# Patient Record
Sex: Male | Born: 1940
Health system: Southern US, Community
[De-identification: ages and names within clinical notes are randomized; demographics above are authoritative.]

## PROBLEM LIST (undated history)

## (undated) DIAGNOSIS — R350 Frequency of micturition: Secondary | ICD-10-CM

## (undated) DIAGNOSIS — K219 Gastro-esophageal reflux disease without esophagitis: Secondary | ICD-10-CM

## (undated) DIAGNOSIS — L409 Psoriasis, unspecified: Secondary | ICD-10-CM

## (undated) DIAGNOSIS — I471 Supraventricular tachycardia, unspecified: Secondary | ICD-10-CM

## (undated) DIAGNOSIS — N529 Male erectile dysfunction, unspecified: Secondary | ICD-10-CM

## (undated) DIAGNOSIS — R911 Solitary pulmonary nodule: Secondary | ICD-10-CM

## (undated) DIAGNOSIS — I639 Cerebral infarction, unspecified: Secondary | ICD-10-CM

## (undated) DIAGNOSIS — R972 Elevated prostate specific antigen [PSA]: Secondary | ICD-10-CM

## (undated) DIAGNOSIS — H269 Unspecified cataract: Secondary | ICD-10-CM

## (undated) DIAGNOSIS — I10 Essential (primary) hypertension: Secondary | ICD-10-CM

## (undated) DIAGNOSIS — T4145XA Adverse effect of unspecified anesthetic, initial encounter: Secondary | ICD-10-CM

## (undated) DIAGNOSIS — Z87442 Personal history of urinary calculi: Secondary | ICD-10-CM

## (undated) DIAGNOSIS — T8859XA Other complications of anesthesia, initial encounter: Secondary | ICD-10-CM

## (undated) DIAGNOSIS — E785 Hyperlipidemia, unspecified: Secondary | ICD-10-CM

## (undated) HISTORY — PX: POLYPECTOMY: SHX149

## (undated) HISTORY — DX: Personal history of urinary calculi: Z87.442

## (undated) HISTORY — DX: Gastro-esophageal reflux disease without esophagitis: K21.9

## (undated) HISTORY — DX: Male erectile dysfunction, unspecified: N52.9

## (undated) HISTORY — DX: Supraventricular tachycardia: I47.1

## (undated) HISTORY — PX: NM MYOVIEW LTD: HXRAD82

## (undated) HISTORY — PX: COLONOSCOPY: SHX174

## (undated) HISTORY — DX: Psoriasis, unspecified: L40.9

## (undated) HISTORY — DX: Essential (primary) hypertension: I10

## (undated) HISTORY — DX: Unspecified cataract: H26.9

## (undated) HISTORY — DX: Hyperlipidemia, unspecified: E78.5

## (undated) HISTORY — DX: Cerebral infarction, unspecified: I63.9

## (undated) HISTORY — DX: Supraventricular tachycardia, unspecified: I47.10

---
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
## 1997-12-14 ENCOUNTER — Emergency Department (HOSPITAL_COMMUNITY): Admission: EM | Admit: 1997-12-14 | Discharge: 1997-12-14 | Payer: Self-pay

## 2003-04-20 ENCOUNTER — Encounter: Admission: RE | Admit: 2003-04-20 | Discharge: 2003-07-19 | Payer: Self-pay | Admitting: Internal Medicine

## 2003-05-21 LAB — FECAL OCCULT BLOOD, GUAIAC: Fecal Occult Blood: NEGATIVE

## 2004-01-29 ENCOUNTER — Ambulatory Visit: Payer: Self-pay | Admitting: Internal Medicine

## 2004-07-29 ENCOUNTER — Ambulatory Visit: Payer: Self-pay | Admitting: Internal Medicine

## 2005-08-04 ENCOUNTER — Emergency Department (HOSPITAL_COMMUNITY): Admission: EM | Admit: 2005-08-04 | Discharge: 2005-08-04 | Payer: Self-pay | Admitting: Emergency Medicine

## 2006-04-27 ENCOUNTER — Ambulatory Visit: Payer: Self-pay | Admitting: Internal Medicine

## 2006-04-27 LAB — CONVERTED CEMR LAB
Albumin: 4.7 g/dL (ref 3.5–5.2)
BUN: 23 mg/dL (ref 6–23)
Basophils Absolute: 0 10*3/uL (ref 0.0–0.1)
Basophils Relative: 1 % (ref 0–1)
CO2: 22 meq/L (ref 19–32)
Calcium: 10 mg/dL (ref 8.4–10.5)
Chloride: 105 meq/L (ref 96–112)
Creatinine, Ser: 1.12 mg/dL (ref 0.40–1.50)
Eosinophils Absolute: 0.2 10*3/uL (ref 0.0–0.7)
Eosinophils Relative: 4 % (ref 0–5)
Glucose, Bld: 206 mg/dL — ABNORMAL HIGH (ref 70–99)
HCT: 46.4 % (ref 39.0–52.0)
Hemoglobin: 15.7 g/dL (ref 13.0–17.0)
Hgb A1c MFr Bld: 7.9 % — ABNORMAL HIGH (ref 4.6–6.1)
Lymphocytes Relative: 21 % (ref 12–46)
Lymphs Abs: 1.4 10*3/uL (ref 0.7–3.3)
MCHC: 33.8 g/dL (ref 30.0–36.0)
MCV: 88.7 fL (ref 78.0–100.0)
Monocytes Absolute: 0.5 10*3/uL (ref 0.2–0.7)
Monocytes Relative: 8 % (ref 3–11)
Neutro Abs: 4.6 10*3/uL (ref 1.7–7.7)
Neutrophils Relative %: 67 % (ref 43–77)
Phosphorus: 2.5 mg/dL (ref 2.3–4.6)
Platelets: 198 10*3/uL (ref 150–400)
Potassium: 4.5 meq/L (ref 3.5–5.3)
RBC: 5.23 M/uL (ref 4.22–5.81)
RDW: 13 % (ref 11.5–14.0)
Sodium: 141 meq/L (ref 135–145)
TSH: 2.047 microintl units/mL (ref 0.350–5.50)
WBC: 6.9 10*3/uL (ref 4.0–10.5)

## 2006-05-08 ENCOUNTER — Ambulatory Visit: Payer: Self-pay

## 2006-05-08 ENCOUNTER — Encounter: Payer: Self-pay | Admitting: Internal Medicine

## 2006-05-28 ENCOUNTER — Ambulatory Visit: Payer: Self-pay | Admitting: Internal Medicine

## 2006-11-13 DIAGNOSIS — E785 Hyperlipidemia, unspecified: Secondary | ICD-10-CM | POA: Insufficient documentation

## 2006-11-13 DIAGNOSIS — L408 Other psoriasis: Secondary | ICD-10-CM

## 2006-11-13 DIAGNOSIS — L219 Seborrheic dermatitis, unspecified: Secondary | ICD-10-CM | POA: Insufficient documentation

## 2006-11-13 DIAGNOSIS — E1129 Type 2 diabetes mellitus with other diabetic kidney complication: Secondary | ICD-10-CM | POA: Insufficient documentation

## 2006-12-07 ENCOUNTER — Ambulatory Visit: Payer: Self-pay | Admitting: Internal Medicine

## 2006-12-10 LAB — CONVERTED CEMR LAB
ALT: 24 units/L (ref 0–53)
Albumin: 4.3 g/dL (ref 3.5–5.2)
BUN: 21 mg/dL (ref 6–23)
CO2: 32 meq/L (ref 19–32)
Calcium: 10.2 mg/dL (ref 8.4–10.5)
Chloride: 109 meq/L (ref 96–112)
Cholesterol: 217 mg/dL (ref 0–200)
Creatinine, Ser: 0.9 mg/dL (ref 0.4–1.5)
Creatinine,U: 230.8 mg/dL
Direct LDL: 155.1 mg/dL
GFR calc Af Amer: 109 mL/min
GFR calc non Af Amer: 90 mL/min
Glucose, Bld: 81 mg/dL (ref 70–99)
HDL: 40.1 mg/dL (ref >39.0)
Hgb A1c MFr Bld: 6 % (ref 4.6–6.0)
Microalb Creat Ratio: 13.9 mg/g (ref 0.0–30.0)
Microalb, Ur: 3.2 mg/dL — ABNORMAL HIGH (ref 0.0–1.9)
Phosphorus: 3 mg/dL (ref 2.3–4.6)
Potassium: 4.3 meq/L (ref 3.5–5.1)
Sodium: 146 meq/L — ABNORMAL HIGH (ref 135–145)
TSH: 1.46 microintl units/mL (ref 0.35–5.50)
Total CHOL/HDL Ratio: 5.4
Triglycerides: 167 mg/dL — ABNORMAL HIGH (ref 0–149)
VLDL: 33 mg/dL (ref 0–40)

## 2007-04-26 ENCOUNTER — Telehealth (INDEPENDENT_AMBULATORY_CARE_PROVIDER_SITE_OTHER): Payer: Self-pay | Admitting: Family Medicine

## 2007-04-26 ENCOUNTER — Ambulatory Visit: Payer: Self-pay | Admitting: Family Medicine

## 2007-05-10 DIAGNOSIS — R972 Elevated prostate specific antigen [PSA]: Secondary | ICD-10-CM | POA: Insufficient documentation

## 2007-05-22 ENCOUNTER — Ambulatory Visit: Payer: Self-pay | Admitting: Internal Medicine

## 2007-05-24 LAB — CONVERTED CEMR LAB
Albumin: 4 g/dL (ref 3.5–5.2)
BUN: 21 mg/dL (ref 6–23)
CO2: 30 meq/L (ref 19–32)
Calcium: 10.2 mg/dL (ref 8.4–10.5)
Chloride: 106 meq/L (ref 96–112)
Creatinine, Ser: 1 mg/dL (ref 0.4–1.5)
GFR calc Af Amer: 96 mL/min
GFR calc non Af Amer: 79 mL/min
Glucose, Bld: 104 mg/dL — ABNORMAL HIGH (ref 70–99)
Hgb A1c MFr Bld: 6 % (ref 4.6–6.0)
PSA: 9.13 ng/mL — ABNORMAL HIGH (ref 0.10–4.00)
Phosphorus: 2.9 mg/dL (ref 2.3–4.6)
Potassium: 4.7 meq/L (ref 3.5–5.1)
Sodium: 141 meq/L (ref 135–145)

## 2007-07-04 ENCOUNTER — Ambulatory Visit: Payer: Self-pay | Admitting: Internal Medicine

## 2007-07-04 LAB — CONVERTED CEMR LAB
Albumin: 4.2 g/dL (ref 3.5–5.2)
BUN: 19 mg/dL (ref 6–23)
CO2: 30 meq/L (ref 19–32)
Calcium: 9.9 mg/dL (ref 8.4–10.5)
Chloride: 109 meq/L (ref 96–112)
Creatinine, Ser: 1 mg/dL (ref 0.4–1.5)
GFR calc Af Amer: 96 mL/min
GFR calc non Af Amer: 79 mL/min
Glucose, Bld: 137 mg/dL — ABNORMAL HIGH (ref 70–99)
Phosphorus: 2.7 mg/dL (ref 2.3–4.6)
Potassium: 4.8 meq/L (ref 3.5–5.1)
Sodium: 142 meq/L (ref 135–145)

## 2007-10-22 ENCOUNTER — Ambulatory Visit: Payer: Self-pay | Admitting: Internal Medicine

## 2007-10-23 LAB — CONVERTED CEMR LAB: Hgb A1c MFr Bld: 8.2 % — ABNORMAL HIGH (ref 4.6–6.0)

## 2008-02-19 ENCOUNTER — Ambulatory Visit: Payer: Self-pay | Admitting: Internal Medicine

## 2008-02-19 DIAGNOSIS — N529 Male erectile dysfunction, unspecified: Secondary | ICD-10-CM | POA: Insufficient documentation

## 2008-02-24 LAB — CONVERTED CEMR LAB
Albumin: 4.1 g/dL (ref 3.5–5.2)
BUN: 25 mg/dL — ABNORMAL HIGH (ref 6–23)
CO2: 27 meq/L (ref 19–32)
Calcium: 10.1 mg/dL (ref 8.4–10.5)
Chloride: 110 meq/L (ref 96–112)
Creatinine, Ser: 0.9 mg/dL (ref 0.4–1.5)
GFR calc Af Amer: 108 mL/min
GFR calc non Af Amer: 89 mL/min
Glucose, Bld: 104 mg/dL — ABNORMAL HIGH (ref 70–99)
Hgb A1c MFr Bld: 6.8 % — ABNORMAL HIGH (ref 4.6–6.0)
PSA, Free Pct: 15 — ABNORMAL LOW (ref >25)
PSA, Free Pct: 22 — ABNORMAL LOW (ref >25)
PSA, Free: 1 ng/mL
PSA, Free: 1.7 ng/mL
PSA: 6.66 ng/mL — ABNORMAL HIGH (ref 0.10–4.00)
PSA: 7.86 ng/mL — ABNORMAL HIGH (ref 0.10–4.00)
Phosphorus: 3 mg/dL (ref 2.3–4.6)
Potassium: 4.6 meq/L (ref 3.5–5.1)
Sodium: 141 meq/L (ref 135–145)

## 2008-04-02 ENCOUNTER — Encounter (INDEPENDENT_AMBULATORY_CARE_PROVIDER_SITE_OTHER): Payer: Self-pay | Admitting: *Deleted

## 2008-07-17 ENCOUNTER — Telehealth: Payer: Self-pay | Admitting: Family Medicine

## 2008-08-13 ENCOUNTER — Encounter: Payer: Self-pay | Admitting: Internal Medicine

## 2008-08-14 ENCOUNTER — Ambulatory Visit: Payer: Self-pay | Admitting: Internal Medicine

## 2008-08-18 LAB — CONVERTED CEMR LAB
ALT: 31 units/L (ref 0–53)
AST: 22 units/L (ref 0–37)
Albumin: 4.3 g/dL (ref 3.5–5.2)
Alkaline Phosphatase: 43 units/L (ref 39–117)
BUN: 28 mg/dL — ABNORMAL HIGH (ref 6–23)
Bilirubin, Direct: 0.1 mg/dL (ref 0.0–0.3)
CO2: 28 meq/L (ref 19–32)
Calcium: 9.8 mg/dL (ref 8.4–10.5)
Chloride: 107 meq/L (ref 96–112)
Cholesterol: 155 mg/dL (ref 0–200)
Creatinine, Ser: 1.1 mg/dL (ref 0.4–1.5)
Creatinine,U: 169.2 mg/dL
Glucose, Bld: 136 mg/dL — ABNORMAL HIGH (ref 70–99)
HDL: 43.8 mg/dL (ref >39.00)
Hgb A1c MFr Bld: 7.4 % — ABNORMAL HIGH (ref 4.6–6.5)
LDL Cholesterol: 74 mg/dL (ref 0–99)
Microalb Creat Ratio: 11.8 mg/g (ref 0.0–30.0)
Microalb, Ur: 2 mg/dL — ABNORMAL HIGH (ref 0.0–1.9)
PSA, Free Pct: 15 — ABNORMAL LOW (ref >25)
PSA, Free: 1.3 ng/mL
PSA: 8.52 ng/mL — ABNORMAL HIGH (ref 0.10–4.00)
Phosphorus: 2.8 mg/dL (ref 2.3–4.6)
Potassium: 4.5 meq/L (ref 3.5–5.1)
Sodium: 141 meq/L (ref 135–145)
TSH: 1.69 microintl units/mL (ref 0.35–5.50)
Total Bilirubin: 1.2 mg/dL (ref 0.3–1.2)
Total CHOL/HDL Ratio: 4
Total Protein: 7.1 g/dL (ref 6.0–8.3)
Triglycerides: 187 mg/dL — ABNORMAL HIGH (ref 0.0–149.0)
VLDL: 37.4 mg/dL (ref 0.0–40.0)

## 2008-08-27 ENCOUNTER — Ambulatory Visit: Payer: Self-pay | Admitting: Internal Medicine

## 2008-08-27 LAB — CONVERTED CEMR LAB: Fecal Occult Bld: NEGATIVE

## 2008-11-27 ENCOUNTER — Encounter: Payer: Self-pay | Admitting: Internal Medicine

## 2009-02-16 ENCOUNTER — Ambulatory Visit: Payer: Self-pay | Admitting: Internal Medicine

## 2009-02-18 LAB — CONVERTED CEMR LAB
Basophils Absolute: 0 10*3/uL (ref 0.0–0.1)
Basophils Relative: 0.1 % (ref 0.0–3.0)
Bilirubin, Direct: 0.1 mg/dL (ref 0.0–0.3)
Calcium: 10.1 mg/dL (ref 8.4–10.5)
Direct LDL: 90.4 mg/dL
Eosinophils Absolute: 0.2 10*3/uL (ref 0.0–0.7)
Eosinophils Relative: 2.7 % (ref 0.0–5.0)
GFR calc non Af Amer: 58.31 mL/min (ref >60)
HDL: 42.8 mg/dL (ref >39.00)
Hemoglobin: 14.5 g/dL (ref 13.0–17.0)
Lymphocytes Relative: 15.4 % (ref 12.0–46.0)
Lymphs Abs: 1.2 10*3/uL (ref 0.7–4.0)
MCHC: 34.2 g/dL (ref 30.0–36.0)
Monocytes Absolute: 0.6 10*3/uL (ref 0.1–1.0)
Monocytes Relative: 8.2 % (ref 3.0–12.0)
Phosphorus: 2.7 mg/dL (ref 2.3–4.6)
Potassium: 4.8 meq/L (ref 3.5–5.1)
RBC: 4.59 M/uL (ref 4.22–5.81)
Sodium: 142 meq/L (ref 135–145)
TSH: 1.6 microintl units/mL (ref 0.35–5.50)
Total Protein: 6.7 g/dL (ref 6.0–8.3)
VLDL: 51.2 mg/dL — ABNORMAL HIGH (ref 0.0–40.0)

## 2009-03-17 ENCOUNTER — Telehealth: Payer: Self-pay | Admitting: Internal Medicine

## 2009-04-15 ENCOUNTER — Ambulatory Visit: Payer: Self-pay | Admitting: Internal Medicine

## 2009-08-16 ENCOUNTER — Ambulatory Visit: Payer: Self-pay | Admitting: Internal Medicine

## 2009-08-26 ENCOUNTER — Encounter: Payer: Self-pay | Admitting: Internal Medicine

## 2009-08-27 ENCOUNTER — Encounter: Payer: Self-pay | Admitting: Internal Medicine

## 2009-08-27 ENCOUNTER — Ambulatory Visit: Payer: Self-pay

## 2009-09-17 ENCOUNTER — Encounter (INDEPENDENT_AMBULATORY_CARE_PROVIDER_SITE_OTHER): Payer: Self-pay

## 2009-09-21 ENCOUNTER — Encounter (INDEPENDENT_AMBULATORY_CARE_PROVIDER_SITE_OTHER): Payer: Self-pay

## 2009-09-21 ENCOUNTER — Ambulatory Visit: Payer: Self-pay | Admitting: Internal Medicine

## 2009-10-05 ENCOUNTER — Ambulatory Visit: Payer: Self-pay | Admitting: Internal Medicine

## 2009-10-05 LAB — HM COLONOSCOPY

## 2009-10-07 ENCOUNTER — Encounter: Payer: Self-pay | Admitting: Internal Medicine

## 2009-10-14 ENCOUNTER — Encounter: Payer: Self-pay | Admitting: Internal Medicine

## 2009-11-09 ENCOUNTER — Encounter: Payer: Self-pay | Admitting: Internal Medicine

## 2009-11-19 ENCOUNTER — Emergency Department (HOSPITAL_COMMUNITY): Admission: EM | Admit: 2009-11-19 | Discharge: 2009-11-19 | Payer: Self-pay | Admitting: Family Medicine

## 2010-02-15 ENCOUNTER — Ambulatory Visit: Payer: Self-pay | Admitting: Internal Medicine

## 2010-03-18 ENCOUNTER — Ambulatory Visit
Admission: RE | Admit: 2010-03-18 | Discharge: 2010-03-18 | Payer: Self-pay | Source: Home / Self Care | Attending: Internal Medicine | Admitting: Internal Medicine

## 2010-03-18 ENCOUNTER — Encounter: Payer: Self-pay | Admitting: Internal Medicine

## 2010-03-18 DIAGNOSIS — Z87442 Personal history of urinary calculi: Secondary | ICD-10-CM | POA: Insufficient documentation

## 2010-03-18 LAB — HM DIABETES FOOT EXAM

## 2010-03-20 LAB — CONVERTED CEMR LAB
AST: 21 units/L (ref 0–37)
Albumin: 4.6 g/dL (ref 3.5–5.2)
Alkaline Phosphatase: 40 units/L (ref 39–117)
BUN: 29 mg/dL — ABNORMAL HIGH (ref 6–23)
Basophils Absolute: 0.1 10*3/uL (ref 0.0–0.1)
Basophils Relative: 0.7 % (ref 0.0–3.0)
Bilirubin, Direct: 0.1 mg/dL (ref 0.0–0.3)
Calcium: 9.7 mg/dL (ref 8.4–10.5)
Cholesterol: 153 mg/dL (ref 0–200)
Creatinine, Ser: 1.2 mg/dL (ref 0.4–1.5)
Eosinophils Absolute: 0.1 10*3/uL (ref 0.0–0.7)
Glucose, Bld: 96 mg/dL (ref 70–99)
Hgb A1c MFr Bld: 6.4 % (ref 4.6–6.5)
LDL Cholesterol: 68 mg/dL (ref 0–99)
MCHC: 34.4 g/dL (ref 30.0–36.0)
MCV: 91.3 fL (ref 78.0–100.0)
Monocytes Absolute: 0.6 10*3/uL (ref 0.1–1.0)
Neutrophils Relative %: 73.7 % (ref 43.0–77.0)
Phosphorus: 2.1 mg/dL — ABNORMAL LOW (ref 2.3–4.6)
RBC: 4.59 M/uL (ref 4.22–5.81)
RDW: 13.2 % (ref 11.5–14.6)
Total CHOL/HDL Ratio: 3
Total Protein: 7.3 g/dL (ref 6.0–8.3)

## 2010-03-21 LAB — CONVERTED CEMR LAB: Hgb A1c MFr Bld: 6.8 % — ABNORMAL HIGH (ref <5.7)

## 2010-03-24 NOTE — Medication Information (Signed)
Summary: [facility]  [facility]   Imported By: Lanelle Bal 10/21/2009 11:59:03  _____________________________________________________________________  External Attachment:    Type:   Image     Comment:   External Document

## 2010-03-24 NOTE — Letter (Signed)
Summary: Patient Notice- Polyp Results  [facility]  [address] [place], [place] [address]   Phone: [phone]  Fax: [phone]        October 07, 2009 MRN: [id number]    Tony Jackson [address] [place], [place]  [address]    Dear [name],  I am pleased to inform you that the colon polyp(s) removed during your recent colonoscopy was (were) found to be benign (no cancer detected) upon pathologic examination.The polyps were adenomatous ( precancerous)  I recommend you have a repeat colonoscopy examination in 3_ years to look for recurrent polyps, as having colon polyps increases your risk for having recurrent polyps or even colon cancer in the future.  Should you develop new or worsening symptoms of abdominal pain, bowel habit changes or bleeding from the rectum or bowels, please schedule an evaluation with either your primary care physician or with me.  Additional information/recommendations:  _x_ No further action with gastroenterology is needed at this time. Please      follow-up with your primary care physician for your other healthcare      needs.  __ Please call [phone] to schedule a return visit to review your      situation.  __ Please keep your follow-up visit as already scheduled.  __ Continue treatment plan as outlined the day of your exam.  Please call us if you are having persistent problems or have questions about your condition that have not been fully answered at this time.  Sincerely,  Hart Carwin MD  This letter has been electronically signed by your physician.  Appended Document: Patient Notice- Polyp Results letter mailed

## 2010-03-24 NOTE — Letter (Signed)
Summary: [facility]  [facility]   Imported By: Lanelle Bal 11/18/2009 14:08:19  _____________________________________________________________________  External Attachment:    Type:   Image     Comment:   External Document  Appended Document: [facility] PSA increased againg but recent biopsy was negative ---holding off on another for now

## 2010-03-24 NOTE — Letter (Signed)
Summary: Diabetic Instructions  [facility]  [address] [place], [place] [address]   Phone: [phone]  Fax: [phone]    Tony Jackson 11/10/1940 MRN: [id number]   _  _   ORAL DIABETIC MEDICATION INSTRUCTIONS  The day before your procedure:   Take your diabetic pill as you do normally  The day of your procedure:   Do not take your diabetic pill    We will check your blood sugar levels during the admission process and again in Recovery before discharging you home  ________________________________________________________________________  _  _   INSULIN (LONG ACTING) MEDICATION INSTRUCTIONS (Lantus, NPH, 70/30, Humulin, Novolin-N)   The day before your procedure:   Take  your regular evening dose    The day of your procedure:   Do not take your morning dose    _  _   INSULIN (SHORT ACTING) MEDICATION INSTRUCTIONS (Regular, Humulog, Novolog)   The day before your procedure:   Do not take your evening dose   The day of your procedure:   Do not take your morning dose   _  _   INSULIN PUMP MEDICATION INSTRUCTIONS  We will contact the physician managing your diabetic care for written dosage instructions for the day before your procedure and the day of your procedure.  Once we have received the instructions, we will contact you.

## 2010-03-24 NOTE — Procedures (Signed)
Summary: Colonoscopy  Patient: Tony Jackson Note: All result statuses are Final unless otherwise noted.  Tests: (1) Colonoscopy (COL)   COL Colonoscopy           DONE     [facility]     [address]     [place], [place]  [address]           COLONOSCOPY PROCEDURE REPORT           PATIENT:  Tony Jackson, Tony Jackson  MR#:  [id number]     BIRTHDATE:  Oct 06, 1940, 68 yrs. old  GENDER:  male     ENDOSCOPIST:  Hedwig Morton. Juanda Chance, MD     REF. BY:  Tillman Abide, M.D.     PROCEDURE DATE:  10/05/2009     PROCEDURE:  Colonoscopy 19147     ASA CLASS:  Class I     INDICATIONS:  Routine Risk Screening     MEDICATIONS:   Versed 7 mg, Fentanyl 50 mcg           DESCRIPTION OF PROCEDURE:   After the risks benefits and     alternatives of the procedure were thoroughly explained, informed     consent was obtained.  Digital rectal exam was performed and     revealed no rectal masses.   The LB [id number]-[id number] endoscope     was introduced through the anus and advanced to the cecum, which     was identified by both the appendix and ileocecal valve, without     limitations.  The quality of the prep was good, using MiraLax.     The instrument was then slowly withdrawn as the colon was fully     examined.     <<PROCEDUREIMAGES>>           FINDINGS:  There were multiple polyps identified and removed. 4     polyps : 15 mm sessile polyp at 60cm, 8 mm polyp at 35 cm, 5mm     polyp at 30 cm and 4 mmpolyp in the rectum The polyps were removed     using cold biopsy forceps. Polyps were snared without cautery.     Retrieval was successful (see image4, image5, image6, image7, and     image8). snare polyp  This was otherwise a normal examination of     the colon (see image9, image3, and image2).  Mild diverticulosis     was found in the sigmoid colon (see image1).   Retroflexed views     in the rectum revealed no abnormalities.    The scope was then     withdrawn from the patient and the procedure completed.        COMPLICATIONS:  None     ENDOSCOPIC IMPRESSION:     1) Polyps, multiple     2) Otherwise normal examination     3) Mild diverticulosis in the sigmoid colon     RECOMMENDATIONS:     1) Await pathology results     2) High fiber diet.     REPEAT EXAM:  In 3 year(s) for.           ______________________________     Hedwig Morton. Juanda Chance, MD           CC:           n.     eSIGNED:   Hedwig Morton. Brodie at 10/05/2009 11:35 AM           Junious Silk, [id number]  Note: An exclamation mark (!) indicates a result that was not dispersed into the flowsheet. Document Creation Date: 10/05/2009 11:36 AM _______________________________________________________________________  (1) Order result status: Final Collection or observation date-time: 10/05/2009 11:28 Requested date-time:  Receipt date-time:  Reported date-time:  Referring Physician:   Ordering Physician: Lina Sar [phone]) Specimen Source:  Source: Launa Grill Order Number: [id number] Lab site:   Appended Document: Colonoscopy     Procedures Next Due Date:    Colonoscopy: 10/2012

## 2010-03-24 NOTE — Assessment & Plan Note (Signed)
Summary: CPX/DLO   Vital Signs:  Patient profile:   70 year old male Weight:      185 pounds BMI:     27.42 Temp:     98.5 degrees F oral Pulse rate:   64 / minute Pulse rhythm:   regular BP sitting:   130 / 80  (left arm) Cuff size:   large  Vitals Entered By: Mervin Hack CMA Duncan Dull) (August 16, 2009 11:41 AM) CC: ADULT PHYSICAL   History of Present Illness: Doing okay did improve diet after last visit A1c back under 8% Has been working out at [employer] some as well  not checking sugars regularly has eye appt soon ---6 month follow up  Had prostate biopsy passed out during procedure Biopsy was negative fortunately  sigmoidoscopy last 6 years ago  Passed out in urologist's office before biopsy had it done went home and tried to go out to do lawn after and passed out again (several months ago) no chest pain no change in exercise tolerance occ mild orthostatic dizziness  Preventive Screening-Counseling & Management  Alcohol-Tobacco     Smoking Status: quit > 6 months     Cans of tobacco/week: uses regularly     Tobacco Counseling: to quit use of tobacco products  Allergies: No Known Drug Allergies  Past History:  Past medical, surgical, family and social histories (including risk factors) reviewed for relevance to current acute and chronic problems.  Past Medical History: Reviewed history from 02/19/2008 and no changes required. Diabetes mellitus, type II Hyperlipidemia Psoriasis Hypertension Erectile dysfunction  Past Surgical History: Reviewed history from 11/13/2006 and no changes required. Tonsillectomy, child Myoview stress- normal EF 56% 03/08  Family History: Reviewed history from 11/13/2006 and no changes required. Father: Died at age 82, CHF Mother: Alive, DM, Breast cancer Siblings:2 brothers living, one with ? cardiomyopathy No CAD ??HTN DM in  Mom No colon or prostate cancer Breast cancer + Mom, Mat GM  Social History: Reviewed  history from 11/13/2006 and no changes required. Marital Status: Married Children: 2 Occupation: Holiday representative - paving Former Smoker--now chews Alcohol use-no Smoking Status:  quit > 6 months  Review of Systems General:  weight down 9# sleeps fine wears seat belt. Eyes:  Complains of vision loss-1 eye; denies double vision; has had a couple of brief episodes of loss of vision in lower half of right eye some months ago. ENT:  Denies decreased hearing and ringing in ears; teeth okay--regular with dentist. CV:  Complains of fainting and lightheadness; denies chest pain or discomfort, difficulty breathing at night, difficulty breathing while lying down, palpitations, and shortness of breath with exertion; passed out in urologist's office some months ago --then later that day occ mild orthostasis. Resp:  Complains of cough; denies shortness of breath; occ cough. GI:  Complains of change in bowel habits and indigestion; denies abdominal pain, bloody stools, dark tarry stools, nausea, and vomiting; indigestion this weekend One episode of diarrhea today. GU:  Complains of erectile dysfunction; denies urinary frequency and urinary hesitancy; meds didn't help ED. MS:  Denies joint pain and joint swelling. Derm:  Complains of rash; denies lesion(s); uses topical Rx for psoriasis. Neuro:  Denies headaches, numbness, tingling, and weakness. Psych:  Complains of anxiety; denies depression; feels some stress with lack of work feels that he might be ready to retire soon. Heme:  Denies abnormal bruising and enlarge lymph nodes. Allergy:  Denies seasonal allergies and sneezing.  Physical Exam  General:  alert and normal  appearance.   Eyes:  pupils equal, pupils round, pupils reactive to light, and no optic disk abnormalities.   Ears:  R ear normal and L ear normal.   Mouth:  no erythema and no exudates.   Neck:  supple, no masses, no thyromegaly, no carotid bruits, and no cervical lymphadenopathy.     Lungs:  normal respiratory effort, no intercostal retractions, no accessory muscle use, normal breath sounds, no crackles, and no wheezes.   Heart:  normal rate, regular rhythm, no murmur, and no gallop.   Abdomen:  soft, non-tender, and no masses.   Msk:  no joint tenderness and no joint swelling.   Pulses:  2+ in feet Extremities:  no edema Neurologic:  alert & oriented X3, strength normal in all extremities, and gait normal.   Skin:  no suspicious lesions and no ulcerations.   Axillary Nodes:  No palpable lymphadenopathy Psych:  normally interactive, good eye contact, not anxious appearing, and not depressed appearing.    Diabetes Management Exam:    Foot Exam (with socks and/or shoes not present):       Sensory-Pinprick/Light touch:          Left medial foot (L-4): normal          Left dorsal foot (L-5): normal          Left lateral foot (S-1): normal          Right medial foot (L-4): normal          Right dorsal foot (L-5): normal          Right lateral foot (S-1): normal       Inspection:          Left foot: normal          Right foot: normal       Nails:          Left foot: normal          Right foot: normal    Eye Exam:       Eye Exam done elsewhere          Date: 02/22/2009          Results: no retinopathy           Done by: Dr Dagoberto Ligas   Impression & Recommendations:  Problem # 1:  PREVENTIVE HEALTH CARE (ICD-V70.0) Assessment Comment Only will follow up with urology for abnormal PSA will set up colonoscopy  Problem # 2:  SYNCOPE (ICD-780.2) Assessment: New  sounds vasovagal  no testing needed  Orders: EKG w/ Interpretation (93000)  Problem # 3:  UNSPECIFIED VISUAL LOSS (ICD-369.9) Assessment: New  not complete visual field but high risk for vascular problems will check carotids discussed tobacco cessation  Orders: Doppler Referral (Doppler)  Problem # 4:  DIABETES MELLITUS, TYPE II (ICD-250.00) Assessment: Comment Only  hopefully control  still okay--he doesn't check \ His updated medication list for this problem includes:    Metformin Hcl 500 Mg Tabs (Metformin hcl) .Marland Kitchen... Take 1 tablet by mouth twice a day    Lisinopril 10 Mg Tabs (Lisinopril) .Marland Kitchen... Take one by mouth daily    Glipizide Xl 5 Mg Xr24h-tab (Glipizide) .Marland Kitchen... 1 daily before breakfast  Labs Reviewed: Creat: 1.3 (02/16/2009)     Last Eye Exam: no retinopathy  (02/22/2009) Reviewed HgBA1c results: 7.6 (04/15/2009)  9.1 (02/16/2009)  Orders: TLB-Microalbumin/Creat Ratio, Urine (82043-MALB) TLB-A1C / Hgb A1C (Glycohemoglobin) (83036-A1C)  Problem # 5:  HYPERLIPIDEMIA (ICD-272.4) Assessment: Unchanged  no  problems with meds will check labs  His updated medication list for this problem includes:    Simvastatin 40 Mg Tabs (Simvastatin) .Marland Kitchen... 1 daily at bedtime  Labs Reviewed: SGOT: 24 (02/16/2009)   SGPT: 30 (02/16/2009)   HDL:42.80 (02/16/2009), 43.80 (08/14/2008)  LDL:74 (08/14/2008), DEL (16/11/9602)  Chol:172 (02/16/2009), 155 (08/14/2008)  Trig:256.0 (02/16/2009), 187.0 (08/14/2008)  Orders: TLB-Lipid Panel (80061-LIPID) TLB-Hepatic/Liver Function Pnl (80076-HEPATIC)  Problem # 6:  HYPERTENSION (ICD-401.9) Assessment: Unchanged  if continued orthostatic symptoms may need to consider decreasing dose  His updated medication list for this problem includes:    Lisinopril 10 Mg Tabs (Lisinopril) .Marland Kitchen... Take one by mouth daily  BP today: 130/80 Prior BP: 146/90 (02/16/2009)  Labs Reviewed: K+: 4.8 (02/16/2009) Creat: : 1.3 (02/16/2009)   Chol: 172 (02/16/2009)   HDL: 42.80 (02/16/2009)   LDL: 74 (08/14/2008)   TG: 256.0 (02/16/2009)  Orders: TLB-Renal Function Panel (80069-RENAL) TLB-CBC Platelet - w/Differential (85025-CBCD) TLB-TSH (Thyroid Stimulating Hormone) (84443-TSH) Venipuncture (54098)  Problem # 7:  ERECTILE DYSFUNCTION, ORGANIC (ICD-607.84) Assessment: Comment Only trying viagra not much help with cialis  The following  medications were removed from the medication list:    Cialis 20 Mg Tabs (Tadalafil) .Marland Kitchen... 1/2 -1 about an hour before sex    Cialis 20 Mg Tabs (Tadalafil) .Marland Kitchen... As needed His updated medication list for this problem includes:    Viagra 100 Mg Tabs (Sildenafil citrate) .Marland Kitchen... 1/2-1 tab about 30-60 minutes before sex  Complete Medication List: 1)  Metformin Hcl 500 Mg Tabs (Metformin hcl) .... Take 1 tablet by mouth twice a day 2)  Simvastatin 40 Mg Tabs (Simvastatin) .Marland Kitchen.. 1 daily at bedtime 3)  Lisinopril 10 Mg Tabs (Lisinopril) .... Take one by mouth daily 4)  Glipizide Xl 5 Mg Xr24h-tab (Glipizide) .Marland Kitchen.. 1 daily before breakfast 5)  Mometasone Furoate 0.1 % Crea (Mometasone furoate) .... Apply as needed 6)  Lifescan Unistik 2 Misc (Lancets) .... Check two times a day 7)  Viagra 100 Mg Tabs (Sildenafil citrate) .... 1/2-1 tab about 30-60 minutes before sex  Other Orders: Gastroenterology Referral (GI)  Patient Instructions: 1)  Please try quitting the chewing tobacco by using a 21mg  nicotine patch daily for 2-3 months 2)  Please schedule a follow-up appointment in 6 months .  3)  Schedule a colonoscopy/ sigmoidoscopy to help detect colon cancer.  4)  Please set up carotid ultrasound also Prescriptions: VIAGRA 100 MG TABS (SILDENAFIL CITRATE) 1/2-1 tab about 30-60 minutes before sex  #6 x 5   Entered and Authorized by:   Cindee Salt MD   Signed by:   Cindee Salt MD on 08/16/2009   Method used:   Electronically to        [facility]  [address] [phone]* (retail)       [address]       [place], [place]  [address]       Ph: [phone]       Fax: [phone]   RxID:   [id number]   Current Allergies (reviewed today): No known allergies    EKG  Procedure date:  08/16/2009  Findings:      sinus bradycardia @59  normal

## 2010-03-24 NOTE — Letter (Signed)
Summary: [facility] Instructions  [facility]  [address] [place], [place] [address]   Phone: [phone]  Fax: [phone]       ALANMICHAEL BARMORE    Jun 17, 1940    MRN: [id number]       Procedure Day Dorna Bloom:  Jake Shark  10/05/09     Arrival Time:  9:30am     Procedure Time:  10:30am     Location of Procedure:                    Julian[facility] Pares  [facility] (4th Floor)    PREPARATION FOR COLONOSCOPY WITH MIRALAX  Starting 5 days prior to your procedure  THURSDAY 08/11 do not eat nuts, seeds, popcorn, corn, beans, peas,  salads, or any raw vegetables.  Do not take any fiber supplements (e.g. Metamucil, Citrucel, and Benefiber). ____________________________________________________________________________________________________   THE DAY BEFORE YOUR PROCEDURE         DATE: MONDAY  08/15  1   Drink clear liquids the entire day-NO SOLID FOOD  2   Do not drink anything colored red or purple.  Avoid juices with pulp.  No orange juice.  3   Drink at least 64 oz. (8 glasses) of fluid/clear liquids during the day to prevent dehydration and help the prep work efficiently.  CLEAR LIQUIDS INCLUDE: Water Jello Ice Popsicles Tea (sugar ok, no milk/cream) Powdered fruit flavored drinks Coffee (sugar ok, no milk/cream) Gatorade Juice: apple, white grape, white cranberry  Lemonade Clear bullion, consomm, broth Carbonated beverages (any kind) Strained chicken noodle soup Hard Candy  4   Mix the entire bottle of Miralax with 64 oz. of Gatorade/Powerade in the morning and put in the refrigerator to chill.  5   At 3:00 pm take 2 Dulcolax/Bisacodyl tablets.  6   At 4:30 pm take one Reglan/Metoclopramide tablet.  7  Starting at 5:00 pm drink one 8 oz glass of the Miralax mixture every 15-20 minutes until you have finished drinking the entire 64 oz.  You should finish drinking prep around 7:30 or 8:00 pm.  8   If you are nauseated, you may take the 2nd Reglan/Metoclopramide  tablet at 6:30 pm.        9    At 8:00 pm take 2 more DULCOLAX/Bisacodyl tablets.     THE DAY OF YOUR PROCEDURE      DATE:  TUESDAY  08/16  You may drink clear liquids until  8:30am  (2 HOURS BEFORE PROCEDURE).   MEDICATION INSTRUCTIONS  Unless otherwise instructed, you should take regular prescription medications with a small sip of water as early as possible the morning of your procedure.  Diabetic patients - see separate instructions.          OTHER INSTRUCTIONS  You will need a responsible adult at least 70 years of age to accompany you and drive you home.   This person must remain in the waiting room during your procedure.  Wear loose fitting clothing that is easily removed.  Leave jewelry and other valuables at home.  However, you may wish to bring a book to read or an iPod/MP3 player to listen to music as you wait for your procedure to start.  Remove all body piercing jewelry and leave at home.  Total time from sign-in until discharge is approximately 2-3 hours.  You should go home directly after your procedure and rest.  You can resume normal activities the day after your procedure.  The day  of your procedure you should not:   Drive   Make legal decisions   Operate machinery   Drink alcohol   Return to work  You will receive specific instructions about eating, activities and medications before you leave.   The above instructions have been reviewed and explained to me by   Ulis Rias RN  September 21, 2009 1:49 PM     I fully understand and can verbalize these instructions _____________________________ Date _______

## 2010-03-24 NOTE — Progress Notes (Signed)
Summary: Metformin, Simvastatin,Lisinopril & Glypizide rxs.  Phone Note Refill Request Call back at [phone] Message from:  Patient on March 17, 2009 10:19 AM  Refills Requested: Medication #1:  METFORMIN HCL 500 MG TABS Take 1 tablet by mouth twice a day  Medication #2:  SIMVASTATIN 40 MG  TABS 1 daily at bedtime  Medication #3:  LISINOPRIL 10 MG  TABS Take one by mouth daily  Medication #4:  GLIPIZIDE XL 5 MG XR24H-TAB 1 daily before breakfast Pt request written rx for the four meds listed for 90 day supply with one year refills please. When rxs are ready for pick up please call pt at [phone]. Thank you.   Method Requested: Pick up at Office Initial call taken by: Lewanda Rife LPN,  March 17, 2009 10:20 AM Caller: Patient Call For: Cindee Salt MD  Follow-up for Phone Call        okay to prepare Rx for me If [facility], can send electronically Follow-up by: Cindee Salt MD,  March 17, 2009 10:44 AM  Additional Follow-up for Phone Call Additional follow up Details #1::        pt has changed insurance and now has to mail thru [employer], rx's are on your desk to be signed and pt will pick-up tomorrow. DeShannon Smith CMA Duncan Dull)  March 17, 2009 3:56 PM   patient came in before Dr. Alphonsus Sias had chance to sign rx's, will get Dr. Patsy Lager to sign so pt won't have to come back. ( bad weather today). Additional Follow-up by: Mervin Hack CMA (AAMA),  March 18, 2009 9:00 AM    Prescriptions: GLIPIZIDE XL 5 MG XR24H-TAB (GLIPIZIDE) 1 daily before breakfast  #90 x 3   Entered by:   Mervin Hack CMA (AAMA)   Authorized by:   Hannah Beat MD   Signed by:   Mervin Hack CMA (AAMA) on 03/18/2009   Method used:   Print then Give to Patient   RxID:   [id number] LISINOPRIL 10 MG  TABS (LISINOPRIL) Take one by mouth daily  #90 x 3   Entered by:   Mervin Hack CMA (AAMA)   Authorized by:   Hannah Beat MD   Signed by:   Mervin Hack CMA (AAMA)  on 03/18/2009   Method used:   Print then Give to Patient   RxID:   [id number] SIMVASTATIN 40 MG  TABS (SIMVASTATIN) 1 daily at bedtime  #90 x 3   Entered by:   Mervin Hack CMA (AAMA)   Authorized by:   Hannah Beat MD   Signed by:   Mervin Hack CMA (AAMA) on 03/18/2009   Method used:   Print then Give to Patient   RxID:   [id number] METFORMIN HCL 500 MG TABS (METFORMIN HCL) Take 1 tablet by mouth twice a day  #180 x 3   Entered by:   Mervin Hack CMA (AAMA)   Authorized by:   Hannah Beat MD   Signed by:   Mervin Hack CMA (AAMA) on 03/18/2009   Method used:   Print then Give to Patient   RxID:   [id number] GLIPIZIDE XL 5 MG XR24H-TAB (GLIPIZIDE) 1 daily before breakfast  #90 x 3   Entered by:   Mervin Hack CMA (AAMA)   Authorized by:   Cindee Salt MD   Signed by:   Mervin Hack CMA (AAMA) on 03/17/2009   Method used:   Print then Give to Patient   RxID:   [id number] LISINOPRIL 10  MG  TABS (LISINOPRIL) Take one by mouth daily  #90 x 3   Entered by:   Mervin Hack CMA (AAMA)   Authorized by:   Cindee Salt MD   Signed by:   Mervin Hack CMA (AAMA) on 03/17/2009   Method used:   Print then Give to Patient   RxID:   [id number] SIMVASTATIN 40 MG  TABS (SIMVASTATIN) 1 daily at bedtime  #90 x 3   Entered by:   Mervin Hack CMA (AAMA)   Authorized by:   Cindee Salt MD   Signed by:   Mervin Hack CMA (AAMA) on 03/17/2009   Method used:   Print then Give to Patient   RxID:   [id number] METFORMIN HCL 500 MG TABS (METFORMIN HCL) Take 1 tablet by mouth twice a day  #180 x 3   Entered by:   Mervin Hack CMA (AAMA)   Authorized by:   Cindee Salt MD   Signed by:   Mervin Hack CMA (AAMA) on 03/17/2009   Method used:   Print then Give to Patient   RxID:   [id number]

## 2010-03-24 NOTE — Assessment & Plan Note (Signed)
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  Past Medical History: Diabetes mellitus, type II Hyperlipidemia Psoriasis Hypertension Erectile dysfunction Nephrolithiasis, hx of--- first 2011  Past Surgical History: Reviewed history from 11/13/2006 and no changes required. Tonsillectomy, child Myoview stress- normal EF 56% 03/08  Family History: Reviewed history from 11/13/2006 and no changes required. Father: Died at age 63, CHF Mother: Alive, DM, Breast cancer Siblings:2 brothers living, one with ? cardiomyopathy No CAD ??HTN DM in  Mom No colon or prostate cancer Breast cancer + Mom, Mat GM  Social History: Reviewed history from 11/13/2006 and no changes required. Marital Status: Married Children: 2 Occupation: Holiday representative - paving Former Smoker--now chews Alcohol use-no  Review of Systems       Passed kidney stone last fall weight is stable Sleeps okay  Physical Exam  General:  alert and normal appearance.   Neck:  supple, no masses, no thyromegaly, no carotid bruits, and no cervical lymphadenopathy.   Lungs:  normal respiratory effort, no intercostal retractions, no accessory  muscle use, and normal breath sounds.   Heart:  normal rate, regular rhythm, no murmur, and no gallop.   Pulses:  2+ in feet Extremities:  no edema Neurologic:  strength normal in all extremities and gait normal.   Skin:  no suspicious lesions and no ulcerations.   Psych:  normally interactive, good eye contact, not anxious appearing, and not depressed appearing.    Diabetes Management Exam:    Foot Exam (with socks and/or shoes not present):       Sensory-Pinprick/Light touch:          Left medial foot (L-4): normal          Left dorsal foot (L-5): normal          Left lateral foot (S-1): normal          Right medial foot (L-4): normal          Right dorsal foot (L-5): normal          Right lateral foot (S-1): normal       Inspection:          Left foot: normal          Right foot: normal       Nails:          Left foot: thickened          Right foot: thickened    Eye Exam:       Eye Exam done elsewhere          Date: 04/20/2009  Results: normal          Done by: Dr Dagoberto Ligas   Impression & Recommendations:  Problem # 1:  DIABETES MELLITUS, TYPE II (ICD-250.00) Assessment Unchanged  still seems to have reasonable control will recheck  His updated medication list for this problem includes:    Metformin Hcl 500 Mg Tabs (Metformin hcl) .Marland Kitchen... Take 1 tablet by mouth twice a day    Lisinopril 10 Mg Tabs (Lisinopril) .Marland Kitchen... Take one by mouth daily    Glipizide Xl 5 Mg Xr24h-tab (Glipizide) .Marland Kitchen... 1 daily before breakfast  Labs Reviewed: Creat: 1.2 (08/16/2009)     Last Eye Exam: normal (04/20/2009) Reviewed HgBA1c results: 6.4 (08/16/2009)  7.6 (04/15/2009)  Orders: Venipuncture (16109) Specimen Handling (60454) T- Hemoglobin A1C (09811-91478)  Problem # 2:  HYPERTENSION (ICD-401.9) Assessment: Unchanged good control no changes  His updated medication list for this problem includes:    Lisinopril 10 Mg Tabs (Lisinopril) .Marland Kitchen... Take one by mouth  daily  BP today: 137/71 Prior BP: 130/80 (08/16/2009)  Labs Reviewed: K+: 5.2 (08/16/2009) Creat: : 1.2 (08/16/2009)   Chol: 153 (08/16/2009)   HDL: 46.40 (08/16/2009)   LDL: 68 (08/16/2009)   TG: 194.0 (08/16/2009)  Problem # 3:  HYPERLIPIDEMIA (ICD-272.4) Assessment: Unchanged goal LDL no side effects  His updated medication list for this problem includes:    Simvastatin 40 Mg Tabs (Simvastatin) .Marland Kitchen... 1 daily at bedtime  Labs Reviewed: SGOT: 21 (08/16/2009)   SGPT: 26 (08/16/2009)   HDL:46.40 (08/16/2009), 42.80 (02/16/2009)  LDL:68 (08/16/2009), 74 (08/14/2008)  Chol:153 (08/16/2009), 172 (02/16/2009)  Trig:194.0 (08/16/2009), 256.0 (02/16/2009)  Complete Medication List: 1)  Metformin Hcl 500 Mg Tabs (Metformin hcl) .... Take 1 tablet by mouth twice a day 2)  Simvastatin 40 Mg Tabs (Simvastatin) .Marland Kitchen.. 1 daily at bedtime 3)  Lisinopril 10 Mg Tabs (Lisinopril) .... Take one by mouth daily 4)  Glipizide Xl 5 Mg Xr24h-tab (Glipizide) .Marland Kitchen.. 1 daily before breakfast 5)  Mometasone Furoate 0.1 % Crea (Mometasone furoate) .... Apply as needed 6)  Lifescan Unistik 2 Misc (Lancets) .... Check two times a day 7)  Viagra 100 Mg Tabs (Sildenafil citrate) .... 1/2-1 tab about 30-60 minutes before sex  Patient Instructions: 1)  Please schedule a follow-up appointment in 6 months for physical   Orders Added: 1)  Est. Patient Level IV [29562] 2)  Venipuncture [13086] 3)  Specimen Handling [99000] 4)  T- Hemoglobin A1C [83036-23375]    Current Allergies (reviewed today): No known allergies

## 2010-03-24 NOTE — Miscellaneous (Signed)
Summary: Lec previsit  Clinical Lists Changes  Medications: Added new medication of MIRALAX   POWD (POLYETHYLENE GLYCOL 3350) As per prep  instructions. - Signed Added new medication of DULCOLAX 5 MG  TBEC (BISACODYL) Day before procedure take 2 at 3pm and 2 at 8pm. - Signed Added new medication of REGLAN 10 MG  TABS (METOCLOPRAMIDE HCL) As per prep instructions. - Signed Rx of MIRALAX   POWD (POLYETHYLENE GLYCOL 3350) As per prep  instructions.;  #255gm x 0;  Signed;  Entered by: Ulis Rias RN;  Authorized by: Hart Carwin MD;  Method used: Electronically to [id number] [address] [place], [place], [place]  [address], Ph: [phone], Fax: [phone] Rx of DULCOLAX 5 MG  TBEC (BISACODYL) Day before procedure take 2 at 3pm and 2 at 8pm.;  #4 x 0;  Signed;  Entered by: Ulis Rias RN;  Authorized by: Hart Carwin MD;  Method used: Electronically to [id number] [address] [place], [place], [place]  [address], Ph: [phone], Fax: [phone] Rx of REGLAN 10 MG  TABS (METOCLOPRAMIDE HCL) As per prep instructions.;  #2 x 0;  Signed;  Entered by: Ulis Rias RN;  Authorized by: Hart Carwin MD;  Method used: Electronically to [id number] [address], [place], [place], [place]  [address], Ph: [phone], Fax: [phone]    Prescriptions: REGLAN 10 MG  TABS (METOCLOPRAMIDE HCL) As per prep instructions.  #2 x 0   Entered by:   Ulis Rias RN   Authorized by:   Hart Carwin MD   Signed by:   Ulis Rias RN on 09/21/2009   Method used:   [facility] to        [facility] [phone] (retail)       [address]       [place], [place]  [address]       Ph: [phone]       Fax: [phone]   RxID:   [id number] DULCOLAX 5 MG  TBEC (BISACODYL) Day before procedure take 2 at 3pm and 2 at 8pm.  #4 x 0   Entered by:   Ulis Rias RN   Authorized by:   Hart Carwin MD   Signed  by:   Ulis Rias RN on 09/21/2009   Method used:   [facility]ronically to        [facility] [phone]* (retail)       [address]       [place], [place]  [address]       Ph: [phone]       Fax: [phone]   RxID:   [id number] MIRALAX   POWD (POLYETHYLENE GLYCOL 3350) As per prep  instructions.  #255gm x 0   Entered by:   Ulis Rias RN   Authorized by:   Hart Carwin MD   Signed by:   Ulis Rias RN on 09/21/2009   Method used:   [facility] to        [facility] [phone] ([facility]ail)       [address]       [place], [place]  [address]       Ph: [phone]  Fax: [phone]   RxID:   [id number]

## 2010-03-24 NOTE — Miscellaneous (Signed)
Summary: Orders Update  Clinical Lists Changes  Orders: Added new Test order of Carotid Duplex (Carotid Duplex) - Signed 

## 2010-05-06 LAB — GLUCOSE, CAPILLARY
Glucose-Capillary: 148 mg/dL — ABNORMAL HIGH (ref 70–99)
Glucose-Capillary: 97 mg/dL (ref 70–99)

## 2010-09-26 ENCOUNTER — Encounter: Payer: Self-pay | Admitting: Internal Medicine

## 2010-10-06 ENCOUNTER — Encounter: Payer: Self-pay | Admitting: Internal Medicine

## 2010-10-07 ENCOUNTER — Encounter: Payer: Self-pay | Admitting: Internal Medicine

## 2010-10-07 ENCOUNTER — Ambulatory Visit (INDEPENDENT_AMBULATORY_CARE_PROVIDER_SITE_OTHER): Payer: Medicare Other | Admitting: Internal Medicine

## 2010-10-07 VITALS — BP 128/80 | HR 54 | Temp 97.9°F | Ht 69.0 in | Wt 183.0 lb

## 2010-10-07 DIAGNOSIS — L408 Other psoriasis: Secondary | ICD-10-CM

## 2010-10-07 DIAGNOSIS — Z2911 Encounter for prophylactic immunotherapy for respiratory syncytial virus (RSV): Secondary | ICD-10-CM

## 2010-10-07 DIAGNOSIS — E785 Hyperlipidemia, unspecified: Secondary | ICD-10-CM

## 2010-10-07 DIAGNOSIS — I10 Essential (primary) hypertension: Secondary | ICD-10-CM

## 2010-10-07 DIAGNOSIS — Z0000 Encounter for general adult medical examination without abnormal findings: Secondary | ICD-10-CM | POA: Insufficient documentation

## 2010-10-07 DIAGNOSIS — E119 Type 2 diabetes mellitus without complications: Secondary | ICD-10-CM

## 2010-10-07 LAB — CBC WITH DIFFERENTIAL/PLATELET
Basophils Absolute: 0 10*3/uL (ref 0.0–0.1)
Hemoglobin: 13.5 g/dL (ref 13.0–17.0)
Lymphocytes Relative: 19 % (ref 12.0–46.0)
Monocytes Relative: 9.3 % (ref 3.0–12.0)
Neutro Abs: 4.1 10*3/uL (ref 1.4–7.7)
Neutrophils Relative %: 67.9 % (ref 43.0–77.0)
Platelets: 185 10*3/uL (ref 150.0–400.0)
RDW: 14 % (ref 11.5–14.6)

## 2010-10-07 LAB — BASIC METABOLIC PANEL
Calcium: 9.9 mg/dL (ref 8.4–10.5)
Creatinine, Ser: 1.4 mg/dL (ref 0.4–1.5)
GFR: 52.41 mL/min — ABNORMAL LOW (ref >60.00)
Glucose, Bld: 145 mg/dL — ABNORMAL HIGH (ref 70–99)
Sodium: 141 mEq/L (ref 135–145)

## 2010-10-07 LAB — HEPATIC FUNCTION PANEL
AST: 18 U/L (ref 0–37)
Albumin: 4.7 g/dL (ref 3.5–5.2)
Alkaline Phosphatase: 44 U/L (ref 39–117)
Total Bilirubin: 0.9 mg/dL (ref 0.3–1.2)

## 2010-10-07 LAB — TSH: TSH: 0.51 u[IU]/mL (ref 0.35–5.50)

## 2010-10-07 LAB — MICROALBUMIN / CREATININE URINE RATIO
Microalb Creat Ratio: 1.5 mg/g (ref 0.0–30.0)
Microalb, Ur: 2.9 mg/dL — ABNORMAL HIGH (ref 0.0–1.9)

## 2010-10-07 LAB — LIPID PANEL
HDL: 53.4 mg/dL (ref >39.00)
Total CHOL/HDL Ratio: 3

## 2010-10-07 MED ORDER — MOMETASONE FUROATE 0.1 % EX CREA: 1.0000 "application " | TOPICAL_CREAM | Freq: Two times a day (BID) | CUTANEOUS | Status: DC | PRN

## 2010-10-07 NOTE — Assessment & Plan Note (Signed)
Doesn't check regularly Hopefully still good control Lab Results  Component Value Date   HGBA1C 6.8* 03/18/2010

## 2010-10-07 NOTE — Assessment & Plan Note (Signed)
Relatively mild Does well with the mometasone

## 2010-10-07 NOTE — Assessment & Plan Note (Signed)
BP Readings from Last 3 Encounters:  10/07/10 128/80  03/18/10 137/71  08/16/09 130/80   Good control Due for labs

## 2010-10-07 NOTE — Assessment & Plan Note (Signed)
No problems with meds Will check labs

## 2010-10-07 NOTE — Progress Notes (Signed)
Subjective:    Patient ID: Tony Jackson, male    DOB: 1940-03-29, 70 y.o.   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 222 last night 130's last time but not often No hypoglycemic reactions Keeps up with eye doctor--every 6-8 months now (early cataracts) No foot pain or sores  Last PSA at urologist was still high but apparently stable Discussed considering dropping this testing  Current Outpatient Prescriptions on File Prior to Visit  Medication Sig Dispense Refill  . glipiZIDE (GLUCOTROL XL) 5 MG 24 hr tablet Take 5 mg by mouth daily.        . Lancets (LIFESCAN UNISTIK 2) MISC by Does not apply route 2 (two) times daily.        Marland Kitchen lisinopril (PRINIVIL,ZESTRIL) 10 MG tablet Take 10 mg by mouth daily.        . metFORMIN (GLUCOPHAGE) 500 MG tablet Take 500 mg by mouth 2 (two) times daily with a meal.        . mometasone (ELOCON) 0.1 % cream Apply 1 application topically daily.        . sildenafil (VIAGRA) 100 MG tablet Take 50-100 mg by mouth daily as needed.        . simvastatin (ZOCOR) 40 MG tablet Take 40 mg by mouth at bedtime.          No Known Allergies  Past Medical History  Diagnosis Date  . Diabetes mellitus   . Hyperlipidemia   . Psoriasis   . Hypertension   . ED (erectile dysfunction)   . History of nephrolithiasis     Past Surgical History  Procedure Date  . Tonsillectomy   . Nm myoview ltd     normal EF 56% 03/08    Family History  Problem Relation Age of Onset  . Cancer Mother     breast cancer  . Diabetes Mother   . Heart disease Neg Hx     History   Social History  . Marital Status: Married    Spouse Name: N/A    Number of Children: 2  . Years of Education: N/A   Occupational History  . Holiday representative- paving    Social History Main Topics  . Smoking status: Former Games developer  . Smokeless tobacco: Current User    Types: Chew  . Alcohol Use: No  .  Drug Use: No  . Sexually Active: Not on file   Other Topics Concern  . Not on file   Social History Narrative  . No narrative on file   Review of Systems  Constitutional: Negative for fatigue and unexpected weight change.       Weight stable Wears seat belt Needs to restart his exercise  HENT: Negative for hearing loss, congestion, rhinorrhea, dental problem and tinnitus.        Overdue with dentist  Eyes: Negative for visual disturbance.       No diplopia or focal vision loss   Respiratory: Positive for cough. Negative for chest tightness and shortness of breath.        Dry cough seems better  Cardiovascular: Negative for chest pain, palpitations and leg swelling.  Gastrointestinal: Negative for nausea, vomiting, abdominal pain, constipation and blood in stool.       No heartburn  Genitourinary: Positive for urgency.       Stream is slower Occ urgency but not regular Rare nocturia No sex--no problem  Musculoskeletal: Positive for arthralgias. Negative for back pain and joint swelling.       Right hip pain and was limping on one job several weeks ago--no problem since then  Skin: Positive for rash. Negative for wound.       Psoriasis---uses cream occ  Neurological: Positive for light-headedness. Negative for dizziness, syncope, weakness, numbness and headaches.  Hematological: Negative for adenopathy. Does not bruise/bleed easily.  Psychiatric/Behavioral: Positive for dysphoric mood. Negative for sleep disturbance. The patient is not nervous/anxious.        Feels down when he is not busy       Objective:   Physical Exam  Constitutional: He is oriented to person, place, and time. He appears well-developed and well-nourished. No distress.  HENT:  Head: Normocephalic and atraumatic.  Right Ear: External ear normal.  Left Ear: External ear normal.  Mouth/Throat: Oropharynx is clear and moist. No oropharyngeal exudate.       TMs normal  Eyes: Conjunctivae and EOM are  normal. Pupils are equal, round, and reactive to light.  Neck: Normal range of motion. Neck supple. No thyromegaly present.  Cardiovascular: Normal rate, regular rhythm, normal heart sounds and intact distal pulses.  Exam reveals no gallop.   No murmur heard. Pulmonary/Chest: Effort normal and breath sounds normal. No respiratory distress. He has no wheezes. He has no rales.  Abdominal: Soft. There is no tenderness.  Musculoskeletal: Normal range of motion. He exhibits no edema and no tenderness.  Lymphadenopathy:    He has no cervical adenopathy.  Neurological: He is alert and oriented to person, place, and time. He exhibits normal muscle tone.       Normal strength and gait  Skin: Rash noted.       Psoriatic plaques on extensor knees and elbows  Psychiatric: He has a normal mood and affect. His behavior is normal. Judgment and thought content normal.          Assessment & Plan:

## 2010-10-07 NOTE — Assessment & Plan Note (Signed)
Discussed increased activity Zostavax today Advised not to have further PSA testing

## 2010-11-21 ENCOUNTER — Telehealth: Payer: Self-pay | Admitting: *Deleted

## 2010-11-21 NOTE — Telephone Encounter (Signed)
Pt states he went on a cruise a couple of weeks ago and since he has been back his blood sugar has been elevated, averaging around 276 two hours after a meal.  The highest it has been has been 445, lowest 142.  He has not done anything different, only went on the cruise for 8 days and ate too much during his trip.  He has eaten better since getting back, but sugar isnt going down as it should.

## 2010-11-21 NOTE — Telephone Encounter (Signed)
Spoke with patient and advised results, he will double up on 500 mg, because he has a lot of those tablets.

## 2010-11-21 NOTE — Telephone Encounter (Signed)
His sugar control had already worsened---he didn't need a cruise with poor eating!! For now, please have him increase the metformin to 1000mg  bid (1 year Rx). Make sure he wasn't on this dose previously and had side effects  He really needs to eat right or we will have to add insulin or another pill to get him under control

## 2011-04-12 ENCOUNTER — Encounter: Payer: Self-pay | Admitting: Internal Medicine

## 2011-04-12 ENCOUNTER — Ambulatory Visit (INDEPENDENT_AMBULATORY_CARE_PROVIDER_SITE_OTHER): Payer: Medicare Other | Admitting: Internal Medicine

## 2011-04-12 VITALS — BP 120/80 | HR 58 | Temp 98.1°F | Ht 69.0 in | Wt 187.0 lb

## 2011-04-12 DIAGNOSIS — E119 Type 2 diabetes mellitus without complications: Secondary | ICD-10-CM

## 2011-04-12 DIAGNOSIS — I10 Essential (primary) hypertension: Secondary | ICD-10-CM

## 2011-04-12 DIAGNOSIS — E785 Hyperlipidemia, unspecified: Secondary | ICD-10-CM

## 2011-04-12 LAB — HEMOGLOBIN A1C: Hgb A1c MFr Bld: 8.5 % — ABNORMAL HIGH (ref 4.6–6.5)

## 2011-04-12 NOTE — Assessment & Plan Note (Signed)
Lab Results  Component Value Date   LDLCALC 70 10/07/2010   Good control No changes needed

## 2011-04-12 NOTE — Assessment & Plan Note (Signed)
Not really at goal I don't think If A1c over 7.5%, will increase metformin to 1000 bid

## 2011-04-12 NOTE — Progress Notes (Signed)
Subjective:    Patient ID: Tony Jackson., male    DOB: [birth year], 71 y.o.   MRN: [id number]  HPI Still gets sporadic work in concrete  Did have problems with increased sugars after cruise Doubled the metformin for about a week Now went back to lower dose Actual had down under 100 at times Up to 179 this AM No diarrhea or stomach trouble on higher dose  Does go to [facility] occasionally Treadmill at and machines Does get some lightheadedness and dyspnea on the machines No real dyspnea with walking No chest pain No syncope  No myalgias on statin  Current Outpatient Prescriptions on File Prior to Visit  Medication Sig Dispense Refill  . glipiZIDE (GLUCOTROL XL) 5 MG 24 hr tablet Take 5 mg by mouth daily.        . Lancets (LIFESCAN UNISTIK 2) MISC by Does not apply route 2 (two) times daily.        Marland Kitchen lisinopril (PRINIVIL,ZESTRIL) 10 MG tablet Take 10 mg by mouth daily.        . metFORMIN (GLUCOPHAGE) 500 MG tablet Take 500 mg by mouth 2 (two) times daily with a meal.        . mometasone (ELOCON) 0.1 % cream Apply 1 application topically 2 (two) times daily as needed.  45 g  3  . sildenafil (VIAGRA) 100 MG tablet Take 50-100 mg by mouth daily as needed.        . simvastatin (ZOCOR) 40 MG tablet Take 40 mg by mouth at bedtime.          No Known Allergies  Past Medical History  Diagnosis Date  . Diabetes mellitus   . Hyperlipidemia   . Psoriasis   . Hypertension   . ED (erectile dysfunction)   . History of nephrolithiasis     Past Surgical History  Procedure Date  . Tonsillectomy   . Nm myoview ltd     normal EF 56% 03/08    Family History  Problem Relation Age of Onset  . Cancer Mother     breast cancer  . Diabetes Mother   . Heart disease Neg Hx     History   Social History  . Marital Status: Married    Spouse Name: N/A    Number of Children: 2  . Years of Education: N/A   Occupational History  . Holiday representative- paving    Social History Main Topics    . Smoking status: Former Games developer  . Smokeless tobacco: Current User    Types: Chew  . Alcohol Use: No  . Drug Use: No  . Sexually Active: Not on file   Other Topics Concern  . Not on file   Social History Narrative  . No narrative on file   Review of Systems Sleeps okay Mood okay Weight up ~4# since summer     Objective:   Physical Exam  Constitutional: He appears well-developed and well-nourished. No distress.  Neck: Normal range of motion. Neck supple. No thyromegaly present.  Cardiovascular: Normal rate, regular rhythm, normal heart sounds and intact distal pulses.  Exam reveals no gallop.   No murmur heard. Pulmonary/Chest: Effort normal and breath sounds normal. No respiratory distress. He has no wheezes. He has no rales.  Musculoskeletal: He exhibits no edema and no tenderness.  Lymphadenopathy:    He has no cervical adenopathy.  Skin: No rash noted.       No foot lesions  Psychiatric: He has a normal  mood and affect. His behavior is normal. Judgment and thought content normal.          Assessment & Plan:

## 2011-04-12 NOTE — Assessment & Plan Note (Signed)
BP Readings from Last 3 Encounters:  04/12/11 120/80  10/07/10 128/80  03/18/10 137/71   Has had some symptoms on weight training but not walking Don't think this is ischemic Discussed fitness

## 2011-05-15 ENCOUNTER — Other Ambulatory Visit: Payer: Self-pay | Admitting: *Deleted

## 2011-05-15 MED ORDER — SIMVASTATIN 40 MG PO TABS: 40.0000 mg | ORAL_TABLET | Freq: Every day | ORAL | Status: DC

## 2011-05-15 MED ORDER — LISINOPRIL 10 MG PO TABS: 10.0000 mg | ORAL_TABLET | Freq: Every day | ORAL | Status: DC

## 2011-05-15 MED ORDER — METFORMIN HCL 1000 MG PO TABS: 1000.0000 mg | ORAL_TABLET | Freq: Two times a day (BID) | ORAL | Status: DC

## 2011-05-15 MED ORDER — GLIPIZIDE ER 5 MG PO TB24: 5.0000 mg | ORAL_TABLET | Freq: Every day | ORAL | Status: DC

## 2011-05-15 NOTE — Telephone Encounter (Signed)
rx sent to pharmacy by e[facility]

## 2011-08-09 ENCOUNTER — Other Ambulatory Visit: Payer: Self-pay | Admitting: *Deleted

## 2011-08-09 MED ORDER — MOMETASONE FUROATE 0.1 % EX CREA: 1.0000 "application " | TOPICAL_CREAM | Freq: Two times a day (BID) | CUTANEOUS | Status: DC | PRN

## 2011-10-12 ENCOUNTER — Encounter: Payer: Self-pay | Admitting: Internal Medicine

## 2011-10-12 ENCOUNTER — Ambulatory Visit (INDEPENDENT_AMBULATORY_CARE_PROVIDER_SITE_OTHER): Payer: Medicare Other | Admitting: Internal Medicine

## 2011-10-12 VITALS — BP 120/70 | HR 71 | Temp 98.0°F | Ht 69.0 in | Wt 183.0 lb

## 2011-10-12 DIAGNOSIS — E119 Type 2 diabetes mellitus without complications: Secondary | ICD-10-CM

## 2011-10-12 DIAGNOSIS — E785 Hyperlipidemia, unspecified: Secondary | ICD-10-CM

## 2011-10-12 DIAGNOSIS — Z0000 Encounter for general adult medical examination without abnormal findings: Secondary | ICD-10-CM

## 2011-10-12 DIAGNOSIS — L570 Actinic keratosis: Secondary | ICD-10-CM | POA: Insufficient documentation

## 2011-10-12 DIAGNOSIS — I10 Essential (primary) hypertension: Secondary | ICD-10-CM

## 2011-10-12 NOTE — Assessment & Plan Note (Signed)
No problems with statin Due for labs 

## 2011-10-12 NOTE — Assessment & Plan Note (Signed)
Hopefully better control Will check labs 

## 2011-10-12 NOTE — Assessment & Plan Note (Signed)
UTD with colon and imms Working on fitness

## 2011-10-12 NOTE — Assessment & Plan Note (Signed)
BP Readings from Last 3 Encounters:  10/12/11 120/70  04/12/11 120/80  10/07/10 128/80   Good control No changes needed Mild ACEI cough?----he feels no change needed

## 2011-10-12 NOTE — Assessment & Plan Note (Signed)
Cryotherapy for ~60 seconds x 2 Tolerated well Discussed home care

## 2011-10-12 NOTE — Progress Notes (Signed)
Subjective:    Patient ID: Tony Jackson, male    DOB: 1940-09-17, 71 y.o.   MRN: [id number]  HPI Here for physical  Ongoing stress Bought 11 acres and a new rental property Lots of walk and he has been stressed and tired Not much exercise--does take dogs out to walk  Has appt with the urologist in October Discussed again considering stopping PSA  Checks sugars randomly--at most daily and occ every 2 weeks Generally under 150 No hypoglycemic reactions Due for eye exam---has appt  Current Outpatient Prescriptions on File Prior to Visit  Medication Sig Dispense Refill  . glipiZIDE (GLUCOTROL XL) 5 MG 24 hr tablet Take 1 tablet (5 mg total) by mouth daily.  90 tablet  3  . Lancets (LIFESCAN UNISTIK 2) MISC by Does not apply route 2 (two) times daily.        Marland Kitchen lisinopril (PRINIVIL,ZESTRIL) 10 MG tablet Take 1 tablet (10 mg total) by mouth daily.  90 tablet  3  . metFORMIN (GLUCOPHAGE) 1000 MG tablet Take 1 tablet (1,000 mg total) by mouth 2 (two) times daily with a meal.  180 tablet  3  . mometasone (ELOCON) 0.1 % cream Apply 1 application topically 2 (two) times daily as needed.  45 g  3  . sildenafil (VIAGRA) 100 MG tablet Take 50-100 mg by mouth daily as needed.        . simvastatin (ZOCOR) 40 MG tablet Take 1 tablet (40 mg total) by mouth at bedtime.  90 tablet  3    No Known Allergies  Past Medical History  Diagnosis Date  . Diabetes mellitus   . Hyperlipidemia   . Psoriasis   . Hypertension   . ED (erectile dysfunction)   . History of nephrolithiasis     Past Surgical History  Procedure Date  . Tonsillectomy   . Nm myoview ltd     normal EF 56% 03/08    Family History  Problem Relation Age of Onset  . Cancer Mother     breast cancer  . Diabetes Mother   . Heart disease Neg Hx     History   Social History  . Marital Status: Married    Spouse Name: N/A    Number of Children: 2  . Years of Education: N/A   Occupational History  . Holiday representative- paving     Social History Main Topics  . Smoking status: Former Games developer  . Smokeless tobacco: Current User    Types: Chew  . Alcohol Use: No  . Drug Use: No  . Sexually Active: Not on file   Other Topics Concern  . Not on file   Social History Narrative   No living willRequests wife as health care POA.Would accept resuscitation attemptsNot sure about tube feeds   Review of Systems  Constitutional:       Weight down a few pounds again Wears seat belt  HENT: Positive for dental problem. Negative for hearing loss, congestion, rhinorrhea and tinnitus.        Regular with dentist---recent root canal  Eyes: Negative for visual disturbance.       Will need cataract surgery before too long  Respiratory: Positive for cough. Negative for chest tightness.        Still has occ cough. May be lisinopril but not bad enough to change med  Cardiovascular: Positive for palpitations. Negative for chest pain and leg swelling.       Occ sense of heart "running away" when  at rest---very brief   Gastrointestinal: Negative for nausea, vomiting, abdominal pain, constipation and blood in stool.       No heartburn   Genitourinary: Positive for urgency and difficulty urinating.       Slow stream in AM usually Hasn't used viagra of late--not very effective  Musculoskeletal: Positive for back pain. Negative for joint swelling and arthralgias.       Occ back pain---improves on its own  Skin: Positive for rash.       Psoriasis still ---satisfied with cream Wants area checked on left temple  Neurological: Positive for light-headedness. Negative for dizziness, syncope, weakness, numbness and headaches.       Did have lightheaded feeling after choking in the car  Hematological: Negative for adenopathy. Bruises/bleeds easily.       Bruised easy after scratching mosquito bites recently  Psychiatric/Behavioral: Negative for disturbed wake/sleep cycle and dysphoric mood. The patient is nervous/anxious.        Some  stress       Objective:   Physical Exam  Constitutional: He is oriented to person, place, and time. He appears well-developed and well-nourished. No distress.  HENT:  Head: Normocephalic and atraumatic.  Right Ear: External ear normal.  Left Ear: External ear normal.  Mouth/Throat: Oropharynx is clear and moist.  Eyes: Conjunctivae and EOM are normal. Pupils are equal, round, and reactive to light.  Neck: Normal range of motion. Neck supple. No thyromegaly present.  Cardiovascular: Normal rate, regular rhythm, normal heart sounds and intact distal pulses.  Exam reveals no gallop.   No murmur heard. Pulmonary/Chest: Effort normal and breath sounds normal. No respiratory distress. He has no wheezes. He has no rales.  Abdominal: Soft. There is no tenderness.  Musculoskeletal: Normal range of motion. He exhibits no edema and no tenderness.  Lymphadenopathy:    He has no cervical adenopathy.  Neurological: He is alert and oriented to person, place, and time.  Skin: No rash noted.       Raised scaly actinic on left temple Scattered small psoriatic plaques  Psychiatric: He has a normal mood and affect. His behavior is normal. Thought content normal.          Assessment & Plan:

## 2011-10-13 LAB — HEPATIC FUNCTION PANEL
ALT: 28 U/L (ref 0–53)
Albumin: 4.7 g/dL (ref 3.5–5.2)
Bilirubin, Direct: 0.1 mg/dL (ref 0.0–0.3)
Total Protein: 7.5 g/dL (ref 6.0–8.3)

## 2011-10-13 LAB — CBC WITH DIFFERENTIAL/PLATELET
Basophils Absolute: 0.1 10*3/uL (ref 0.0–0.1)
Basophils Relative: 1 % (ref 0.0–3.0)
Eosinophils Absolute: 0.2 10*3/uL (ref 0.0–0.7)
HCT: 43.3 % (ref 39.0–52.0)
Hemoglobin: 14.4 g/dL (ref 13.0–17.0)
Lymphocytes Relative: 16.1 % (ref 12.0–46.0)
Lymphs Abs: 1.2 10*3/uL (ref 0.7–4.0)
MCHC: 33.2 g/dL (ref 30.0–36.0)
MCV: 92.3 fl (ref 78.0–100.0)
Monocytes Absolute: 0.6 10*3/uL (ref 0.1–1.0)
Neutro Abs: 5.4 10*3/uL (ref 1.4–7.7)
RBC: 4.69 Mil/uL (ref 4.22–5.81)
RDW: 13.7 % (ref 11.5–14.6)

## 2011-10-13 LAB — HEMOGLOBIN A1C: Hgb A1c MFr Bld: 7.5 % — ABNORMAL HIGH (ref 4.6–6.5)

## 2011-10-13 LAB — BASIC METABOLIC PANEL
CO2: 26 mEq/L (ref 19–32)
Chloride: 107 mEq/L (ref 96–112)
Glucose, Bld: 119 mg/dL — ABNORMAL HIGH (ref 70–99)
Potassium: 4.6 mEq/L (ref 3.5–5.1)
Sodium: 141 mEq/L (ref 135–145)

## 2011-10-13 LAB — LIPID PANEL: Total CHOL/HDL Ratio: 3

## 2011-10-16 ENCOUNTER — Encounter: Payer: Self-pay | Admitting: *Deleted

## 2011-12-07 ENCOUNTER — Other Ambulatory Visit: Payer: Self-pay | Admitting: Urology

## 2011-12-27 ENCOUNTER — Telehealth: Payer: Self-pay

## 2011-12-27 NOTE — Telephone Encounter (Signed)
Pt understood Dr Alphonsus Sias to tell pt about one year ago could stop going to Dr Laverle Patter, urologist. Pt has continued to see Dr Verlee Rossetti PSA 2 weeks ago was 16.1 was rechecked and PSA was 13.Dr Laverle Patter suggest pt to have another prostate biopsy; Pt wants Dr Karle Starch opinion of what he should do.

## 2011-12-27 NOTE — Telephone Encounter (Signed)
Discussed with him At this point, he probably needs to get the biopsy but if that one is negative too, shouldn't check the PSAs any more

## 2012-01-01 ENCOUNTER — Encounter (HOSPITAL_BASED_OUTPATIENT_CLINIC_OR_DEPARTMENT_OTHER): Payer: Self-pay | Admitting: *Deleted

## 2012-01-02 ENCOUNTER — Encounter (HOSPITAL_BASED_OUTPATIENT_CLINIC_OR_DEPARTMENT_OTHER): Payer: Self-pay | Admitting: *Deleted

## 2012-01-02 NOTE — Progress Notes (Signed)
NPO AFTER MN WITH EXCEPTION CLEAR LIQUIDS(NO CREAM/ MILK PRODUCTS)  UNTIL 0800, INCLUDES NO CHEW TOBACCO. ARRIVES AT 1300. NEEDS ISTAT AND EKG. WILL DO FLEET ENEMA AM OF SURG .

## 2012-01-05 ENCOUNTER — Ambulatory Visit (HOSPITAL_BASED_OUTPATIENT_CLINIC_OR_DEPARTMENT_OTHER): Payer: Medicare Other | Admitting: Anesthesiology

## 2012-01-05 ENCOUNTER — Encounter (HOSPITAL_BASED_OUTPATIENT_CLINIC_OR_DEPARTMENT_OTHER): Payer: Self-pay | Admitting: Anesthesiology

## 2012-01-05 ENCOUNTER — Encounter (HOSPITAL_BASED_OUTPATIENT_CLINIC_OR_DEPARTMENT_OTHER): Payer: Self-pay | Admitting: *Deleted

## 2012-01-05 ENCOUNTER — Encounter (HOSPITAL_COMMUNITY): Payer: Self-pay | Admitting: *Deleted

## 2012-01-05 ENCOUNTER — Ambulatory Visit (HOSPITAL_BASED_OUTPATIENT_CLINIC_OR_DEPARTMENT_OTHER)
Admission: RE | Admit: 2012-01-05 | Discharge: 2012-01-06 | Disposition: A | Discharge status: Home / Self Care | Payer: Medicare Other | Source: Ambulatory Visit | Attending: Urology | Admitting: Urology

## 2012-01-05 ENCOUNTER — Encounter (HOSPITAL_COMMUNITY)
Admission: RE | Disposition: A | Discharge status: Home / Self Care | Payer: Self-pay | Source: Ambulatory Visit | Attending: Urology

## 2012-01-05 DIAGNOSIS — R3915 Urgency of urination: Secondary | ICD-10-CM | POA: Insufficient documentation

## 2012-01-05 DIAGNOSIS — N401 Enlarged prostate with lower urinary tract symptoms: Secondary | ICD-10-CM | POA: Insufficient documentation

## 2012-01-05 DIAGNOSIS — N529 Male erectile dysfunction, unspecified: Secondary | ICD-10-CM | POA: Insufficient documentation

## 2012-01-05 DIAGNOSIS — Z79899 Other long term (current) drug therapy: Secondary | ICD-10-CM | POA: Insufficient documentation

## 2012-01-05 DIAGNOSIS — Z87442 Personal history of urinary calculi: Secondary | ICD-10-CM | POA: Insufficient documentation

## 2012-01-05 DIAGNOSIS — N423 Unspecified dysplasia of prostate: Secondary | ICD-10-CM | POA: Insufficient documentation

## 2012-01-05 DIAGNOSIS — E119 Type 2 diabetes mellitus without complications: Secondary | ICD-10-CM | POA: Insufficient documentation

## 2012-01-05 DIAGNOSIS — Z7982 Long term (current) use of aspirin: Secondary | ICD-10-CM | POA: Insufficient documentation

## 2012-01-05 DIAGNOSIS — R350 Frequency of micturition: Secondary | ICD-10-CM | POA: Insufficient documentation

## 2012-01-05 DIAGNOSIS — I10 Essential (primary) hypertension: Secondary | ICD-10-CM | POA: Insufficient documentation

## 2012-01-05 DIAGNOSIS — N138 Other obstructive and reflux uropathy: Secondary | ICD-10-CM | POA: Insufficient documentation

## 2012-01-05 DIAGNOSIS — R972 Elevated prostate specific antigen [PSA]: Secondary | ICD-10-CM | POA: Insufficient documentation

## 2012-01-05 HISTORY — PX: PROSTATE BIOPSY: SHX241

## 2012-01-05 HISTORY — DX: Elevated prostate specific antigen (PSA): R97.20

## 2012-01-05 HISTORY — DX: Frequency of micturition: R35.0

## 2012-01-05 LAB — GLUCOSE, CAPILLARY: Glucose-Capillary: 97 mg/dL (ref 70–99)

## 2012-01-05 LAB — POCT I-STAT, CHEM 8
BUN: 34 mg/dL — ABNORMAL HIGH (ref 6–23)
HCT: 43 % (ref 39.0–52.0)
Hemoglobin: 14.6 g/dL (ref 13.0–17.0)
Sodium: 144 mEq/L (ref 135–145)
TCO2: 23 mmol/L (ref 0–100)

## 2012-01-05 LAB — HEMOGLOBIN AND HEMATOCRIT, BLOOD: Hemoglobin: 14.8 g/dL (ref 13.0–17.0)

## 2012-01-05 SURGERY — BIOPSY, PROSTATE, RECTAL APPROACH, WITH US GUIDANCE
Anesthesia: Monitor Anesthesia Care | Site: Prostate | Wound class: Clean Contaminated

## 2012-01-05 MED ORDER — SIMVASTATIN 40 MG PO TABS
40.0000 mg | ORAL_TABLET | Freq: Every day | ORAL | Status: DC
  Administered 2012-01-05: 40 mg via ORAL
  Filled 2012-01-05 (×2): qty 1

## 2012-01-05 MED ORDER — LACTATED RINGERS IV SOLN
INTRAVENOUS | Status: DC
  Administered 2012-01-05 (×3): via INTRAVENOUS
  Filled 2012-01-05: qty 1000

## 2012-01-05 MED ORDER — FENTANYL CITRATE 0.05 MG/ML IJ SOLN
INTRAMUSCULAR | Status: DC | PRN
  Administered 2012-01-05: 75 ug via INTRAVENOUS

## 2012-01-05 MED ORDER — LACTATED RINGERS IV SOLN
INTRAVENOUS | Status: DC
  Filled 2012-01-05: qty 1000

## 2012-01-05 MED ORDER — PROPOFOL 10 MG/ML IV BOLUS
INTRAVENOUS | Status: DC | PRN
  Administered 2012-01-05 (×3): 50 mg via INTRAVENOUS

## 2012-01-05 MED ORDER — INSULIN ASPART 100 UNIT/ML ~~LOC~~ SOLN
0.0000 [IU] | SUBCUTANEOUS | Status: DC
  Administered 2012-01-05: 3 [IU] via SUBCUTANEOUS
  Administered 2012-01-06 (×2): 5 [IU] via SUBCUTANEOUS

## 2012-01-05 MED ORDER — METFORMIN HCL 500 MG PO TABS
1000.0000 mg | ORAL_TABLET | Freq: Two times a day (BID) | ORAL | Status: DC
  Administered 2012-01-06: 1000 mg via ORAL
  Filled 2012-01-05 (×3): qty 2

## 2012-01-05 MED ORDER — GLIPIZIDE ER 5 MG PO TB24
5.0000 mg | ORAL_TABLET | Freq: Every day | ORAL | Status: DC
  Filled 2012-01-05: qty 1

## 2012-01-05 MED ORDER — FENTANYL CITRATE 0.05 MG/ML IJ SOLN
25.0000 ug | INTRAMUSCULAR | Status: DC | PRN
  Filled 2012-01-05: qty 1

## 2012-01-05 MED ORDER — POTASSIUM CHLORIDE IN NACL 20-0.45 MEQ/L-% IV SOLN
INTRAVENOUS | Status: DC
  Administered 2012-01-05: 75 mL/h via INTRAVENOUS
  Filled 2012-01-05 (×2): qty 1000

## 2012-01-05 MED ORDER — ONDANSETRON HCL 4 MG/2ML IJ SOLN
INTRAMUSCULAR | Status: DC | PRN
  Administered 2012-01-05 (×2): 4 mg via INTRAVENOUS

## 2012-01-05 MED ORDER — EPHEDRINE SULFATE 50 MG/ML IJ SOLN
INTRAMUSCULAR | Status: DC | PRN
  Administered 2012-01-05 (×4): 10 mg via INTRAVENOUS

## 2012-01-05 MED ORDER — LIDOCAINE HCL (CARDIAC) 20 MG/ML IV SOLN
INTRAVENOUS | Status: DC | PRN
  Administered 2012-01-05: 50 mg via INTRAVENOUS

## 2012-01-05 MED ORDER — LEVOFLOXACIN 500 MG PO TABS: 500.0000 mg | ORAL_TABLET | Freq: Every day | ORAL | Status: DC

## 2012-01-05 MED ORDER — LIDOCAINE HCL 2 % IJ SOLN
INTRAMUSCULAR | Status: DC | PRN
  Administered 2012-01-05: 10 mL

## 2012-01-05 MED ORDER — LEVOFLOXACIN 750 MG PO TABS
750.0000 mg | ORAL_TABLET | ORAL | Status: DC
  Administered 2012-01-06: 750 mg via ORAL
  Filled 2012-01-05: qty 1

## 2012-01-05 MED ORDER — LISINOPRIL 10 MG PO TABS
10.0000 mg | ORAL_TABLET | Freq: Every day | ORAL | Status: DC
  Filled 2012-01-05: qty 1

## 2012-01-05 SURGICAL SUPPLY — 11 items
DRESSING TELFA 8X3 (GAUZE/BANDAGES/DRESSINGS) ×2 IMPLANT
GLOVE BIO SURGEON STRL SZ7.5 (GLOVE) ×2 IMPLANT
NDL SAFETY ECLIPSE 18X1.5 (NEEDLE) IMPLANT
NDL SPNL 22GX7 QUINCKE BK (NEEDLE) IMPLANT
NEEDLE HYPO 18GX1.5 SHARP (NEEDLE)
NEEDLE SPNL 22GX7 QUINCKE BK (NEEDLE) ×2 IMPLANT
SURGILUBE 2OZ TUBE FLIPTOP (MISCELLANEOUS) ×2 IMPLANT
SYR 20CC LL (SYRINGE) ×1 IMPLANT
TOWEL OR 17X24 6PK STRL BLUE (TOWEL DISPOSABLE) ×2 IMPLANT
UNDERPAD 30X30 INCONTINENT (UNDERPADS AND DIAPERS) ×3 IMPLANT
WATER STERILE IRR 500ML POUR (IV SOLUTION) ×2 IMPLANT

## 2012-01-05 NOTE — Progress Notes (Signed)
Report given to Leafy Half Rn , nurse receiving pt room 1440.

## 2012-01-05 NOTE — Progress Notes (Signed)
Pt caregiver from 1530 until pt D/C

## 2012-01-05 NOTE — Anesthesia Postprocedure Evaluation (Addendum)
  Anesthesia Post-op Note  Patient: Tony Jackson  Procedure(s) Performed: Procedure(s) (LRB): BIOPSY TRANSRECTAL ULTRASONIC PROSTATE (TUBP) (N/A)  Patient Location: PACU  Anesthesia Type: MAC  Level of Consciousness: awake and alert   Airway and Oxygen Therapy: Patient Spontanous Breathing  Post-op Pain: mild  Post-op Assessment: Post-op Vital signs reviewed.  Respiratory Function Stable, Patent Airway and No signs of Nausea or vomiting  Post-op Vital Signs: stable  Complications: BP is low and postural. No chest pain. No SOB. Feels dizzy. Discussed with Dr. Laverle Jackson. The plan is to admit for observation. BP 100/60 reclining, and dropped to 68/40's upon standing. He states he had a normal stress test 2-3 years ago.  He had a similar episode following a prostate biopsy in the office. He went home at that time and did pass out at home. The next day after that episode he was fine. Normal myoview in 03/08 with EF 56%. Repeat ECG at 1849 today is normal.

## 2012-01-05 NOTE — Progress Notes (Signed)
Pt arrived via stretcher from OR awake & alert, skin w/d, acyanotic. No acute distress noted. Systolic 67/49, NSR without ectopy w HR 80's, RR 14-18. Pt placed in trendelenberg . Boneta Lucks CRNA administetred epedrine IV & IV fluid  bolus given. At 1545 B/P 92/62. NSR without ectopy. Pt placed in supine position. Conts to be A&Ox3. No acute distress noted. Voices no c/o pain  discomfort.

## 2012-01-05 NOTE — Progress Notes (Signed)
12 lead EKG done @ this time as ordered per Dr Council Mechanic.

## 2012-01-05 NOTE — Transfer of Care (Signed)
Immediate Anesthesia Transfer of Care Note  Patient: Tony Jackson  Procedure(s) Performed: Procedure(s) (LRB) with comments: BIOPSY TRANSRECTAL ULTRASONIC PROSTATE (TUBP) (N/A)  Patient Location: PACU  Anesthesia Type:MAC  Level of Consciousness: awake, alert  and oriented  Airway & Oxygen Therapy: Patient Spontanous Breathing and Patient connected to face mask oxygen  Post-op Assessment: Report given to PACU RN  Post vital signs: Reviewed and stable  Complications: upon arrival bp low, HOB down.  Ephedrine given.  Awake and responding.  IV fluid bolus given.  Now bed level.  Awake and alert.  BP stable at 92/62.

## 2012-01-05 NOTE — Progress Notes (Signed)
ANTIBIOTIC CONSULT NOTE - INITIAL  Pharmacy Consult for Levaquin Indication:   No Known Allergies  Patient Measurements: Height: 5\' 9"  (175.3 cm) Weight: 185 lb (83.915 kg) IBW/kg (Calculated) : 70.7   Vital Signs: Temp: 97.9 F (36.6 C) (11/15 1935) Temp src: Oral (11/15 1344) BP: 123/66 mmHg (11/15 1935) Pulse Rate: 86  (11/15 1935) Intake/Output from previous day:   Intake/Output from this shift:    Labs:  Basename 01/05/12 1340  WBC --  HGB 14.6  PLT --  LABCREA --  CREATININE 1.40*   Estimated Creatinine Clearance: 48.4 ml/min (by C-G formula based on Cr of 1.4).    Microbiology: No results found for this or any previous visit (from the past 720 hour(s)).  Medical History: Past Medical History  Diagnosis Date  . Diabetes mellitus   . Hyperlipidemia   . Psoriasis   . Hypertension   . ED (erectile dysfunction)   . History of nephrolithiasis   . Elevated PSA   . Frequency of urination     Medications:  Prescriptions prior to admission  Medication Sig Dispense Refill  . glipiZIDE (GLUCOTROL XL) 5 MG 24 hr tablet Take 1 tablet (5 mg total) by mouth daily.  90 tablet  3  . levofloxacin (LEVAQUIN) 500 MG tablet Take 500 mg by mouth daily.      Marland Kitchen lisinopril (PRINIVIL,ZESTRIL) 10 MG tablet Take 1 tablet (10 mg total) by mouth daily.  90 tablet  3  . metFORMIN (GLUCOPHAGE) 1000 MG tablet Take 1 tablet (1,000 mg total) by mouth 2 (two) times daily with a meal.  180 tablet  3  . mometasone (ELOCON) 0.1 % cream Apply 1 application topically 2 (two) times daily as needed.  45 g  3  . simvastatin (ZOCOR) 40 MG tablet Take 1 tablet (40 mg total) by mouth at bedtime.  90 tablet  3  . [DISCONTINUED] aspirin 81 MG tablet Take 81 mg by mouth every other day.      . sildenafil (VIAGRA) 100 MG tablet Take 50-100 mg by mouth daily as needed.        Anti-infectives     Start     Dose/Rate Route Frequency Ordered Stop   01/06/12 1000   levofloxacin (LEVAQUIN) tablet 500  mg  Status:  Discontinued        500 mg Oral Daily 01/05/12 1936 01/05/12 1944   01/06/12 0600   levofloxacin (LEVAQUIN) tablet 750 mg        750 mg Oral Every 48 hours 01/05/12 1944           Assessment: 71 yom w/ hx elevated PSA and BPH s/p TUBP. Pt was on Levaquin 500mg  PO daily PTA which has been ordered to continue inpt. Pharmacy has been asked to renally adjust abx. Scr 1.4, CrCl 48 ml/min  Goal of Therapy:  Appropriate dose of Levaquin based on renal fx  Plan:   Change to Levaquin 750mg  PO q48h  Monitor Scr and adjust dose as necessary  Duration of tx per MD  Gwen Her PharmD  [phone] 01/05/2012 7:49 PM

## 2012-01-05 NOTE — Anesthesia Postprocedure Evaluation (Signed)
Anesthesia Post Note  Patient: Tony Jackson  Procedure(s) Performed: Procedure(s) (LRB): BIOPSY TRANSRECTAL ULTRASONIC PROSTATE (TUBP) (N/A)  Anesthesia type: MAC  Patient location: PACU  Post pain: Pain level controlled  Post assessment: Post-op Vital signs reviewed  Last Vitals: BP 92/62  Pulse 89  Temp 36.1 C (Oral)  Resp 18  Ht 5\' 9"  (1.753 m)  Wt 185 lb (83.915 kg)  BMI 27.32 kg/m2  SpO2 98%  Post vital signs: Reviewed  Level of consciousness: awake  Complications: No apparent anesthesia complications

## 2012-01-05 NOTE — Anesthesia Preprocedure Evaluation (Signed)
Anesthesia Evaluation  Patient identified by MRN, date of birth, ID band Patient awake    Reviewed: Allergy & Precautions, H&P , NPO status , Patient's Chart, lab work & pertinent test results  Airway Mallampati: II TM Distance: >3 FB Neck ROM: full    Dental  (+) Teeth Intact and Caps,    Pulmonary neg pulmonary ROS,  breath sounds clear to auscultation  Pulmonary exam normal       Cardiovascular Exercise Tolerance: Good hypertension, Pt. on medications Rhythm:regular Rate:Normal     Neuro/Psych negative neurological ROS  negative psych ROS   GI/Hepatic negative GI ROS, Neg liver ROS,   Endo/Other  diabetes, Well Controlled, Type 2, Oral Hypoglycemic Agents  Renal/GU negative Renal ROS  negative genitourinary   Musculoskeletal   Abdominal   Peds  Hematology negative hematology ROS (+)   Anesthesia Other Findings   Reproductive/Obstetrics negative OB ROS                           Anesthesia Physical Anesthesia Plan  ASA: III  Anesthesia Plan: MAC   Post-op Pain Management:    Induction:   Airway Management Planned:   Additional Equipment:   Intra-op Plan:   Post-operative Plan:   Informed Consent: I have reviewed the patients History and Physical, chart, labs and discussed the procedure including the risks, benefits and alternatives for the proposed anesthesia with the patient or authorized representative who has indicated his/her understanding and acceptance.   Dental Advisory Given  Plan Discussed with: CRNA  Anesthesia Plan Comments:         Anesthesia Quick Evaluation

## 2012-01-05 NOTE — Op Note (Signed)
Preoperative diagnosis: Elevated PSA  Postoperative diagnosis: Elevated PSA  Procedure: 1. Transrectal ultrasound of the prostate 2. Prostate needle biopsy  Surgeon: Dr. Rolly Salter, Montez Hageman.  Anesthesia: IV sedation  Complications: None  EBL: Minimal  Specimens: Specimens were obtained from the lateral and parasagittal regions of the base, mid, and apex of the prostate bilaterally. A total of 26 biopsy cores were obtained.  Disposition specimens: Pathology lab.  Indication: Tony Jackson is a 71 y.o. with a history of an elevated PSA. He did undergo a prior prostate needle biopsy in the past which was negative for malignancy. However, he was not able to tolerate all of his biopsies due to a syncopal episode that occurred during this procedure. The procedure was therefore abandoned and he has been monitored and recently found to have a PSA that is further rising. It was therefore recommended that he proceed with a prostate needle biopsy. Based on his prior difficulty with an office biopsy, we elected to pursue this in the operating room under conscious sedation. The potential risks, complications, and alternative options were discussed in detail. Informed consent was obtained.  Description of procedure:  The patient was taken to the operating room and conscious sedation was administered. He had been provided preoperative antibiotics and did prepare himself with an enema prior to his procedure. He was laid in the left lateral decubitus position. The transrectal ultrasound probe was then placed into the rectum and used to visualize the prostate. 10 cc of 1% lidocaine was then utilized to provide local anesthesia with a periprostatic nerve block. Images of the prostate were then obtained systematically. The prostate appeared large and homogeneous without any significant abnormalities.. The prostate volume was measured at 148.4 cc. Systematic biopsies of the prostate were then performed under  ultrasound guidance. A total of 26 cores were obtained. Biopsies were taken from the lateral and parasagittal regions of the base, mid, and apex of the prostate bilaterally. Additional cores were taken from the transition zone of the prostate. Following removal of the transrectal ultrasound, a digital rectal exam was performed and it was confirmed that no significant bleeding was present. The patient tolerated the procedure well and without complications.

## 2012-01-05 NOTE — Progress Notes (Signed)
Pt noted to have persistent hypotension after his prostate biopsy today.  He had a similar episode after his last biopsy thought to be a vasovagal response.  He is alert and asymptomatic with no SOB, CP, etc.  His BP is normal sitting down but he is orthostatic.  I will admit him for IV hydration and observation overnight.  The risk for a serious underlying cause would appear to be low.  Will check hemoglobin though and monitor to ensure no sepsis.  Continue Levaquin.

## 2012-01-05 NOTE — H&P (Signed)
  History of Present Illness  [name] is a 71 year old with the following urologic history:  1) Elevated PSA: He was initially evaluated in October 2010 for an elevated PSA 8.5 (15% free) prompting a prostate biopsy which was negative. A planned 12-core biopsy was discontinued after 10 cores had been obtained due to a syncopal episode which occurred during his biopsy.  Feb 2011: 10 core biopsy -- negative, Vol 119 cc  2) BPH/LUTS: His baseline symptoms include urgency, frequency, intermittency, and a weak stream.  He drinks about 6 cups of coffee per day which exacerbate his symptoms. Baseline IPSS was 21 but he has not been bothered enough by his symptoms to warrant medical therapy. Prostate volume is 119 cc by ultrasound.  3) Erectile dysfunction: He has used Cialis in the past with success although has been less sexually active more recently.  4) Urolithiasis: He has spontaneously passed one ureteral stone in 2011.   Physical Exam: CV: RRR Lungs: Clear Abd: Soft, ND  Past Medical History Problems  1. History of  Diabetes Mellitus 250.00 2. History of  Hypercholesterolemia 272.0 3. History of  Hypertension 401.9 4. History of  Tuberculin PPD Induration Positive Interpretation 795.5  Surgical History Problems  1. History of  No Surgical Problems  Current Meds 1. Aspirin 325 MG Oral Tablet; Therapy: (Recorded:21Mar2012) to 2. Cialis 20 MG Oral Tablet; Therapy: (Recorded:08Oct2010) to 3. GlipiZIDE XL 5 MG Oral Tablet Extended Release 24 Hour; Therapy: 08Mar2012 to 4. Lisinopril 10 MG Oral Tablet; Therapy: (Recorded:03Oct2012) to 5. MetFORMIN HCl 1000 MG Oral Tablet; Therapy: (Recorded:03Oct2012) to 6. Mometasone Furoate 0.1 % External Cream; Therapy: 17Aug2012 to 7. Simvastatin 80 MG Oral Tablet; Therapy: (Recorded:03Oct2012) to 8. Taclonex 0.005-0.064 % External Ointment; Therapy: (Recorded:03Oct2012) to  Allergies Medication  1. No Known Drug Allergies  Family  History Problems  1. Maternal history of  Breast Cancer V16.3 2. Maternal history of  Heart Disease V17.49 3. Paternal history of  Heart Disease V17.49 Denied  4. Family history of  Prostate Cancer  Social History Problems  1. Former Smoker Quit in [place] 2. Marital History - Currently Married 3. Tobacco Use V15.82 chewing tobacco   Selected Results  PSA REFLEX TO FREE 04Oct2013 09:25AM Heloise Purpura  SPECIMEN TYPE: BLOOD   Test Name Result Flag Reference  PSA 16.08 ng/mL H <=4.00  Test Methodology: ECLIA PSA (Electrochemiluminescence Immunoassay)   PSA Flowsheet [Data Includes: All]   04Oct2013 26Sep2012 16Mar2012 16Sep2011  PSA 16.08 ng/mL 9.31 ng/mL 9.75 ng/mL 12.10 ng/mL  Free PSA  2.00 ng/mL 2.0 ng/mL   % Free PSA  21 % 21 %     Assessment   Discussion/Summary  1. Elevated PSA: I recommended a transrectal ultrasound-guided prostate biopsy and have recommended that we proceed with a saturation technique considering his large prostate and his prior negative biopsy. Considering that he did have a syncopal episode and had difficulty with his first biopsy, we discussed performing this in the operating room under IV sedation. We have reviewed the potential risks and complications associated with this procedure today and he gives his informed consent.

## 2012-01-06 LAB — GLUCOSE, CAPILLARY: Glucose-Capillary: 217 mg/dL — ABNORMAL HIGH (ref 70–99)

## 2012-01-06 NOTE — Progress Notes (Signed)
Patient states he feels good and ready to go home.

## 2012-01-06 NOTE — Progress Notes (Signed)
Patient ID: Tony Jackson, male   DOB: [birth year], 71 y.o.   MRN: [id number]  1 Day Post-Op Subjective: No complaints overnight.  No episodes of hypotension.  Minimal blood in urine or from rectum after biopsy. No dizziness when standing.  Objective: Vital signs in last 24 hours: Temp:  [96.9 F (36.1 C)-98.9 F (37.2 C)] 98.7 F (37.1 C) (11/16 0545) Pulse Rate:  [67-98] 79  (11/16 0545) Resp:  [16-25] 20  (11/16 0545) BP: (67-138)/(49-85) 112/68 mmHg (11/16 0545) SpO2:  [94 %-98 %] 97 % (11/16 0545) Weight:  [83.9 kg (184 lb 15.5 oz)-83.915 kg (185 lb)] 83.9 kg (184 lb 15.5 oz) (11/15 1945)  Intake/Output from previous day: 11/15 0701 - 11/16 0700 In: 3587.5 [P.O.:580; I.V.:3007.5] Out: 800 [Urine:800] Intake/Output this shift: Total I/O In: 607.5 [I.V.:607.5] Out: 800 [Urine:800]  Physical Exam:  General: Alert and oriented CV: RRR Lungs: Clear  Lab Results:  Basename 01/05/12 1950 01/05/12 1340  HGB 14.8 14.6  HCT 43.0 43.0     Studies/Results: No results found.  Assessment/Plan: -  Check orthostatics this morning.  If ok, d/c home.   LOS: 1 day   Tony Jackson,LES 01/06/2012, 6:42 AM

## 2012-01-06 NOTE — Discharge Summary (Signed)
Physician Discharge Summary  Patient ID: Tony Jackson MRN: [id number] DOB/AGE: [birth year] 71 y.o.  Admit date: 01/05/2012 Discharge date: 01/06/2012  Admission Diagnoses: Elevated PSA, hypotension  Discharge Diagnoses: Same   Discharged Condition: stable  Hospital Course: He was admitted for observation after his prostate biopsy under IV sedation after developing persistent hypotension in the PACU.  ECG was normal and he was asymptomatic but significantly orthostatic.  He has had syncopal episodes in the past which has been evaluated without a clear etiology.  In fact, a similar episode had happened previously after a prostate biopsy which prompted this biopsy to be performed in the operating room. He was admitted overnight with IVF hydration.  His Hgb remained stable and his vital signs were stable overnight.  He was felt to be stable for discharge in the morning.   Disposition: Home  Discharge Orders    Future Appointments: Provider: Department: Dept Phone: Center:   04/12/2012 9:00 AM Karie Schwalbe, MD [facility] [phone] [id number]     Future Orders Please Complete By Expires   Discharge patient          Medication List     As of 01/06/2012  6:45 AM    STOP taking these medications         aspirin 81 MG tablet      TAKE these medications         glipiZIDE 5 MG 24 hr tablet   Commonly known as: GLUCOTROL XL   Take 1 tablet (5 mg total) by mouth daily.      levofloxacin 500 MG tablet   Commonly known as: LEVAQUIN   Take 500 mg by mouth daily.      lisinopril 10 MG tablet   Commonly known as: PRINIVIL,ZESTRIL   Take 1 tablet (10 mg total) by mouth daily.      metFORMIN 1000 MG tablet   Commonly known as: GLUCOPHAGE   Take 1 tablet (1,000 mg total) by mouth 2 (two) times daily with a meal.      mometasone 0.1 % cream   Commonly known as: ELOCON   Apply 1 application topically 2 (two) times daily as needed.      sildenafil  100 MG tablet   Commonly known as: VIAGRA   Take 50-100 mg by mouth daily as needed.      simvastatin 40 MG tablet   Commonly known as: ZOCOR   Take 1 tablet (40 mg total) by mouth at bedtime.           Follow-up Information    Follow up with Crecencio Mc, MD. (Will call with biopsy results)    Contact information:   [address] [place] [address] [address] [phone]          Signed: Crecencio Mc 01/06/2012, 6:45 AM

## 2012-01-08 ENCOUNTER — Encounter (HOSPITAL_BASED_OUTPATIENT_CLINIC_OR_DEPARTMENT_OTHER): Payer: Self-pay | Admitting: Urology

## 2012-01-08 NOTE — Anesthesia Postprocedure Evaluation (Signed)
Anesthesia Post Note  Patient: Tony Jackson  Procedure(s) Performed: Procedure(s) (LRB): BIOPSY TRANSRECTAL ULTRASONIC PROSTATE (TUBP) (N/A)  Anesthesia type: MAC  Patient location: PACU  Post pain: Pain level controlled  Post assessment: Post-op Vital signs reviewed  Last Vitals: BP 124/73  Pulse 88  Temp 37.1 C (Oral)  Resp 20  Ht 5\' 9"  (1.753 m)  Wt 184 lb 15.5 oz (83.9 kg)  BMI 27.31 kg/m2  SpO2 97%  Post vital signs: Reviewed  Level of consciousness: awake  PACU course: Pt with severe vasovagal response in OR upon emergence. Hypotension responsive to ephedrine. Pt sitting and eating. VSS stable. Will follow orthostatic vitals and d/c if WNL  Complications: No apparent anesthesia complications

## 2012-01-23 ENCOUNTER — Other Ambulatory Visit: Payer: Self-pay | Admitting: Internal Medicine

## 2012-03-30 HISTORY — PX: CATARACT EXTRACTION W/ INTRAOCULAR LENS IMPLANT: SHX1309

## 2012-04-12 ENCOUNTER — Ambulatory Visit (INDEPENDENT_AMBULATORY_CARE_PROVIDER_SITE_OTHER): Payer: Medicare Other | Admitting: Internal Medicine

## 2012-04-12 ENCOUNTER — Encounter: Payer: Self-pay | Admitting: Internal Medicine

## 2012-04-12 VITALS — BP 128/72 | HR 73 | Temp 98.0°F | Wt 179.0 lb

## 2012-04-12 DIAGNOSIS — I10 Essential (primary) hypertension: Secondary | ICD-10-CM

## 2012-04-12 DIAGNOSIS — N4231 Prostatic intraepithelial neoplasia: Secondary | ICD-10-CM | POA: Insufficient documentation

## 2012-04-12 DIAGNOSIS — J069 Acute upper respiratory infection, unspecified: Secondary | ICD-10-CM

## 2012-04-12 DIAGNOSIS — E119 Type 2 diabetes mellitus without complications: Secondary | ICD-10-CM

## 2012-04-12 DIAGNOSIS — N423 Unspecified dysplasia of prostate: Secondary | ICD-10-CM

## 2012-04-12 NOTE — Assessment & Plan Note (Signed)
Discussed this Will check yearly PSA till 22 and only redo biopsy if major increase

## 2012-04-12 NOTE — Progress Notes (Signed)
Subjective:    Patient ID: Tony Jackson, male    DOB: [birth year], 72 y.o.   MRN: [id number]  HPI Here for check up Mild cold now but not bad Bad cough earlier in week---alka seltzer cold capsules helped him sleep Feeling some better now  Has prostate biopsy Only intraepithelial neoplasia Discussed that this is "precancerous" Discussed yearly recheck of PSA till 71  Hasn't been checking sugars lately No regular exercise No hypoglycemic reactions  Has had some chest pain with the cough No SOB No edema  Current Outpatient Prescriptions on File Prior to Visit  Medication Sig Dispense Refill  . GLIPIZIDE XL 5 MG 24 hr tablet Take 1 tablet by mouth  daily  90 each  3  . lisinopril (PRINIVIL,ZESTRIL) 10 MG tablet Take 1 tablet by mouth  daily  90 tablet  3  . metFORMIN (GLUCOPHAGE) 1000 MG tablet Take 1 tablet by mouth  twice a day with meals  180 tablet  3  . mometasone (ELOCON) 0.1 % cream Apply 1 application topically 2 (two) times daily as needed.  45 g  3  . sildenafil (VIAGRA) 100 MG tablet Take 50-100 mg by mouth daily as needed.       . simvastatin (ZOCOR) 40 MG tablet Take 1 tablet by mouth at  bedtime  90 tablet  3   No current facility-administered medications on file prior to visit.    No Known Allergies  Past Medical History  Diagnosis Date  . Diabetes mellitus   . Hyperlipidemia   . Psoriasis   . Hypertension   . ED (erectile dysfunction)   . History of nephrolithiasis   . Elevated PSA   . Frequency of urination     Past Surgical History  Procedure Laterality Date  . Tonsillectomy    . Nm myoview ltd      normal EF 56% 03/08  . Prostate biopsy  01/05/2012    Procedure: BIOPSY TRANSRECTAL ULTRASONIC PROSTATE (TUBP);  Surgeon: Crecencio Mc, MD;  Location: [facility];  Service: Urology;  Laterality: N/A;  . Cataract extraction w/ intraocular lens implant Right 03/30/12    Dr Dagoberto Ligas    Family History  Problem Relation Age of Onset  .  Cancer Mother     breast cancer  . Diabetes Mother   . Heart disease Neg Hx     History   Social History  . Marital Status: Married    Spouse Name: N/A    Number of Children: 2  . Years of Education: N/A   Occupational History  . Holiday representative- [employer]    Social History Main Topics  . Smoking status: Former Smoker -- 1.00 packs/day for 5 years    Types: Cigarettes  . Smokeless tobacco: Current User    Types: Chew     Comment: QUIT SMOKING CIGARETTES 50 YRS AGO--  CHEWED TOBACCO FOR 40 YRS  . Alcohol Use: No  . Drug Use: No  . Sexually Active: Not Currently   Other Topics Concern  . Not on file   Social History Narrative   No living will   Requests wife as health care POA.   Would accept resuscitation attempts   Not sure about tube feeds   Review of Systems Not regular with exercise Some care with diet---weight down 4# Sleeps well     Objective:   Physical Exam  Constitutional: He appears well-developed and well-nourished. No distress.  HENT:  Mouth/Throat: Oropharynx is clear and moist. No oropharyngeal  exudate.  TMs normal Mild nasal inflammation  Neck: Normal range of motion. Neck supple. No thyromegaly present.  Cardiovascular: Normal rate, regular rhythm, normal heart sounds and intact distal pulses.  Exam reveals no gallop.   No murmur heard. Pulmonary/Chest: Effort normal and breath sounds normal. No respiratory distress. He has no wheezes. He has no rales.  Musculoskeletal: He exhibits no edema and no tenderness.  Lymphadenopathy:    He has no cervical adenopathy.  Skin:  No foot lesions  Psychiatric: He has a normal mood and affect. His behavior is normal.          Assessment & Plan:

## 2012-04-12 NOTE — Assessment & Plan Note (Signed)
Needs to do more exercise Will check A1c

## 2012-04-12 NOTE — Assessment & Plan Note (Signed)
Seems to be resolving No Rx needed

## 2012-04-12 NOTE — Assessment & Plan Note (Signed)
BP Readings from Last 3 Encounters:  04/12/12 128/72  01/06/12 124/73  01/06/12 124/73   Good control No changes needed Lab Results  Component Value Date   CREATININE 1.40* 01/05/2012

## 2012-04-15 ENCOUNTER — Encounter: Payer: Self-pay | Admitting: *Deleted

## 2012-05-29 ENCOUNTER — Other Ambulatory Visit: Payer: Self-pay | Admitting: *Deleted

## 2012-05-29 MED ORDER — METFORMIN HCL 1000 MG PO TABS: 1000.0000 mg | ORAL_TABLET | Freq: Two times a day (BID) | ORAL | Status: DC

## 2012-05-29 MED ORDER — LISINOPRIL 10 MG PO TABS: 10.0000 mg | ORAL_TABLET | Freq: Every day | ORAL | Status: DC

## 2012-05-29 MED ORDER — SIMVASTATIN 40 MG PO TABS: 40.0000 mg | ORAL_TABLET | Freq: Every day | ORAL | Status: DC

## 2012-05-29 MED ORDER — GLIPIZIDE ER 5 MG PO TB24: 5.0000 mg | ORAL_TABLET | Freq: Every day | ORAL | Status: DC

## 2012-07-16 ENCOUNTER — Ambulatory Visit (INDEPENDENT_AMBULATORY_CARE_PROVIDER_SITE_OTHER): Payer: Medicare Other | Admitting: Family Medicine

## 2012-07-16 VITALS — BP 157/72 | HR 65 | Temp 98.0°F | Resp 17 | Ht 69.0 in | Wt 182.0 lb

## 2012-07-16 DIAGNOSIS — IMO0002 Reserved for concepts with insufficient information to code with codable children: Secondary | ICD-10-CM

## 2012-07-16 DIAGNOSIS — Z23 Encounter for immunization: Secondary | ICD-10-CM

## 2012-07-16 DIAGNOSIS — S61409A Unspecified open wound of unspecified hand, initial encounter: Secondary | ICD-10-CM

## 2012-07-16 DIAGNOSIS — T148XXA Other injury of unspecified body region, initial encounter: Secondary | ICD-10-CM

## 2012-07-16 DIAGNOSIS — S61401A Unspecified open wound of right hand, initial encounter: Secondary | ICD-10-CM

## 2012-07-16 DIAGNOSIS — M79609 Pain in unspecified limb: Secondary | ICD-10-CM

## 2012-07-16 NOTE — Progress Notes (Signed)
Subjective:    Patient ID: Tony Jackson, male    DOB: 10-20-1940, 72 y.o.   MRN: [id number]  HPI ADARIAN BUR is a 72 y.o. male  1 hour pta-- cut top of R 3rd finger with new steak knife as taking out of the wrapper. Cut skin over knuckle. Tx: bandage.   Last td greater than 5 years ago.  R hand dominant.   Past Medical History  Diagnosis Date  . Diabetes mellitus   . Hyperlipidemia   . Psoriasis   . Hypertension   . ED (erectile dysfunction)   . History of nephrolithiasis   . Elevated PSA   . Frequency of urination    Past Surgical History  Procedure Laterality Date  . Tonsillectomy    . Nm myoview ltd      normal EF 56% 03/08  . Prostate biopsy  01/05/2012    Procedure: BIOPSY TRANSRECTAL ULTRASONIC PROSTATE (TUBP);  Surgeon: Crecencio Mc, MD;  Location: [facility];  Service: Urology;  Laterality: N/A;  . Cataract extraction w/ intraocular lens implant Right 03/30/12    Dr Dagoberto Ligas   No Known Allergies Prior to Admission medications   Medication Sig Start Date End Date Taking? Authorizing Provider  glipiZIDE (GLIPIZIDE XL) 5 MG 24 hr tablet Take 1 tablet (5 mg total) by mouth daily. 05/29/12  Yes Karie Schwalbe, MD  lisinopril (PRINIVIL,ZESTRIL) 10 MG tablet Take 1 tablet (10 mg total) by mouth daily. 05/29/12  Yes Karie Schwalbe, MD  metFORMIN (GLUCOPHAGE) 1000 MG tablet Take 1 tablet (1,000 mg total) by mouth 2 (two) times daily with a meal. 05/29/12  Yes Karie Schwalbe, MD  mometasone (ELOCON) 0.1 % cream Apply 1 application topically 2 (two) times daily as needed. 08/09/11  Yes Karie Schwalbe, MD  sildenafil (VIAGRA) 100 MG tablet Take 50-100 mg by mouth daily as needed.    Yes Historical Provider, MD  simvastatin (ZOCOR) 40 MG tablet Take 1 tablet (40 mg total) by mouth at bedtime. 05/29/12  Yes Karie Schwalbe, MD   History   Social History  . Marital Status: Married    Spouse Name: N/A    Number of Children: 2  . Years of Education: N/A    Occupational History  . Holiday representative- paving    Social History Main Topics  . Smoking status: Former Smoker -- 1.00 packs/day for 5 years    Types: Cigarettes  . Smokeless tobacco: Current User    Types: Chew     Comment: QUIT SMOKING CIGARETTES 50 YRS AGO--  CHEWED TOBACCO FOR 40 YRS  . Alcohol Use: No  . Drug Use: No  . Sexually Active: Not Currently   Other Topics Concern  . Not on file   Social History Narrative   No living will   Requests wife as health care POA.   Would accept resuscitation attempts   Not sure about tube feeds    Review of Systems No weakness, numbness distally in affected finger.     Objective:   Physical Exam  Vitals reviewed. Musculoskeletal:       Right hand: He exhibits laceration. He exhibits normal range of motion and normal two-point discrimination. Normal sensation noted. Decreased sensation is not present in the ulnar distribution, is not present in the medial distribution and is not present in the radial distribution. Normal strength (full flex anxd ext strength at pip.  DIP held in flexion from prior injury. nvi distally. ) noted. He  exhibits no finger abduction and no thumb/finger opposition.       Hands:       Assessment & Plan:  [name] is a 72 y.o. male  Need for prophylactic vaccination with combined diphtheria-tetanus-pertussis (DTP) vaccine - Plan: Tdap vaccine greater than or equal to 7yo IM  Laceration - Plan: Tdap vaccine greater than or equal to 7yo IM  Open wound, hand, right, initial encounter  Wound - dorsal R 3rd pip. Flap with small attachment.  Discussed possible nonadherence due to near complete lacerartion of flap.  See procedure note. tetanus updated. Rtc/wound care precautions per h/o. Foldover splint to lessen chance of area opening until follow up.   Patient Instructions  WOUND CARE Please return in 7-10 days to have your stitches/staples removed or sooner if you have concerns. Marland Kitchen Keep area clean and  dry for 24 hours. Do not remove bandage, if applied. . After 24 hours, remove bandage and wash wound gently with mild soap and warm water. Reapply a new bandage after cleaning wound, if directed. . Continue daily cleansing with soap and water until stitches/staples are removed. . Do not apply any ointments or creams to the wound while stitches/staples are in place, as this may cause delayed healing. . Notify the office if you experience any of the following signs of infection: Swelling, redness, pus drainage, streaking, fever >101.0 F . Notify the office if you experience excessive bleeding that does not stop after 15-20 minutes of constant, firm pressure.  Can use splint over affected finger until returning for suture removal to lessen chance of this area opening.

## 2012-07-16 NOTE — Progress Notes (Signed)
Verbal consent obtained from the patient.  Local anesthesia with 4cc Lidocaine 2% without epinephrine.  Wound scrubbed with soap and water and rinsed.  Wound closed with #5 5-0 Ethilon simple interrupted sutures.  Wound cleansed and dressed.

## 2012-07-16 NOTE — Patient Instructions (Addendum)
WOUND CARE Please return in 7-10 days to have your stitches/staples removed or sooner if you have concerns. Marland Kitchen Keep area clean and dry for 24 hours. Do not remove bandage, if applied. . After 24 hours, remove bandage and wash wound gently with mild soap and warm water. Reapply a new bandage after cleaning wound, if directed. . Continue daily cleansing with soap and water until stitches/staples are removed. . Do not apply any ointments or creams to the wound while stitches/staples are in place, as this may cause delayed healing. . Notify the office if you experience any of the following signs of infection: Swelling, redness, pus drainage, streaking, fever >101.0 F . Notify the office if you experience excessive bleeding that does not stop after 15-20 minutes of constant, firm pressure.  Can use splint over affected finger until returning for suture removal to lessen chance of this area opening.

## 2012-07-26 ENCOUNTER — Ambulatory Visit (INDEPENDENT_AMBULATORY_CARE_PROVIDER_SITE_OTHER): Payer: Medicare Other | Admitting: Family Medicine

## 2012-07-26 VITALS — BP 120/78 | HR 69 | Temp 97.8°F | Resp 18 | Ht 68.5 in | Wt 176.8 lb

## 2012-07-26 DIAGNOSIS — Z5189 Encounter for other specified aftercare: Secondary | ICD-10-CM

## 2012-07-26 DIAGNOSIS — S61209D Unspecified open wound of unspecified finger without damage to nail, subsequent encounter: Secondary | ICD-10-CM

## 2012-07-26 NOTE — Progress Notes (Signed)
  Subjective:    Patient ID: Tony Jackson., male    DOB: [birth year], 72 y.o.   MRN: [id number]  HPI Tony Jackson. is a 72 y.o. male  See last ov- here for follow up R 3rd finger wound.  Wound closed with #5 5-0 Ethilon simple interrupted sutures on 07/16/12.   No redness, discharge, or fever.  Wore brace until few days ago.  Has been using normally past few days - covered with bandage.    Review of Systems As above.     Objective:   Physical Exam  Vitals reviewed. Constitutional: He is oriented to person, place, and time. He appears well-developed and well-nourished. No distress.  Pulmonary/Chest: Effort normal.  Musculoskeletal:       Hands: Neurological: He is alert and oriented to person, place, and time.  Psychiatric: He has a normal mood and affect. His behavior is normal.       Assessment & Plan:  Tony Jackson. is a 72 y.o. male Wound, open, finger, subsequent encounter #5 SI sutures removed without difficulty.  Steri strip #4 and benzoin applied,  rtc precautions, wound care discussed.

## 2012-09-30 ENCOUNTER — Encounter: Payer: Self-pay | Admitting: Internal Medicine

## 2012-10-15 ENCOUNTER — Encounter: Payer: Medicare Other | Admitting: Internal Medicine

## 2012-12-25 ENCOUNTER — Telehealth: Payer: Self-pay

## 2012-12-25 ENCOUNTER — Encounter: Payer: Self-pay | Admitting: Internal Medicine

## 2012-12-25 ENCOUNTER — Ambulatory Visit (INDEPENDENT_AMBULATORY_CARE_PROVIDER_SITE_OTHER): Payer: Medicare Other | Admitting: Internal Medicine

## 2012-12-25 VITALS — BP 148/90 | HR 78 | Temp 98.6°F | Wt 182.0 lb

## 2012-12-25 DIAGNOSIS — R42 Dizziness and giddiness: Secondary | ICD-10-CM

## 2012-12-25 NOTE — Progress Notes (Signed)
Subjective:    Patient ID: Tony Jackson., male    DOB: 07/23/1940, 72 y.o.   MRN: [id number]  HPI Having some dizziness for a couple of weeks Larey Seat right back in chair when rising quickly a few times Feels that he staggers a bit when walking---has to concentrate Today was looking up working on a tree-- had head looking up and that made it worse  Does feel close to passing out at times Not sure about vertigo---not clearly having rotatory movement Notes it first upon rising in the morning and getting out of chair Does resolve fairly quickly  No chest pain No SOB No headaches  Current Outpatient Prescriptions on File Prior to Visit  Medication Sig Dispense Refill  . glipiZIDE (GLIPIZIDE XL) 5 MG 24 hr tablet Take 1 tablet (5 mg total) by mouth daily.  90 tablet  3  . lisinopril (PRINIVIL,ZESTRIL) 10 MG tablet Take 1 tablet (10 mg total) by mouth daily.  90 tablet  3  . metFORMIN (GLUCOPHAGE) 1000 MG tablet Take 1 tablet (1,000 mg total) by mouth 2 (two) times daily with a meal.  180 tablet  3  . mometasone (ELOCON) 0.1 % cream Apply 1 application topically 2 (two) times daily as needed.  45 g  3  . sildenafil (VIAGRA) 100 MG tablet Take 50-100 mg by mouth daily as needed.       . simvastatin (ZOCOR) 40 MG tablet Take 1 tablet (40 mg total) by mouth at bedtime.  90 tablet  3   No current facility-administered medications on file prior to visit.    No Known Allergies  Past Medical History  Diagnosis Date  . Diabetes mellitus   . Hyperlipidemia   . Psoriasis   . Hypertension   . ED (erectile dysfunction)   . History of nephrolithiasis   . Elevated PSA   . Frequency of urination     Past Surgical History  Procedure Laterality Date  . Tonsillectomy    . Nm myoview ltd      normal EF 56% 03/08  . Prostate biopsy  01/05/2012    Procedure: BIOPSY TRANSRECTAL ULTRASONIC PROSTATE (TUBP);  Surgeon: Crecencio Mc, MD;  Location: [facility];  Service: Urology;   Laterality: N/A;  . Cataract extraction w/ intraocular lens implant Right 03/30/12    Dr Dagoberto Ligas    Family History  Problem Relation Age of Onset  . Cancer Mother     breast cancer  . Diabetes Mother   . Heart disease Neg Hx     History   Social History  . Marital Status: Married    Spouse Name: N/A    Number of Children: 2  . Years of Education: N/A   Occupational History  . Holiday representative- paving    Social History Main Topics  . Smoking status: Former Smoker -- 1.00 packs/day for 5 years    Types: Cigarettes  . Smokeless tobacco: Current User    Types: Chew     Comment: QUIT SMOKING CIGARETTES 50 YRS AGO--  CHEWED TOBACCO FOR 40 YRS  . Alcohol Use: No  . Drug Use: No  . Sexual Activity: Not Currently   Other Topics Concern  . Not on file   Social History Narrative   No living will   Requests wife as health care POA.   Would accept resuscitation attempts   Not sure about tube feeds   Review of Systems No fever Doesn't feel sick Sugar under 120 last check  No nausea or vomiting Has seen some "lights' in his eyes ---but not with the dizziness    Objective:   Physical Exam  Constitutional: He appears well-developed and well-nourished. No distress.  HENT:  Mouth/Throat: Oropharynx is clear and moist. No oropharyngeal exudate.  No dizziness with rapid head movements  Eyes: EOM are normal. Pupils are equal, round, and reactive to light.  Fundi benign No nystagmus  Neck: Normal range of motion. Neck supple. No thyromegaly present.  Cardiovascular: Normal rate, regular rhythm, normal heart sounds and intact distal pulses.  Exam reveals no gallop.   No murmur heard. Pulmonary/Chest: Effort normal and breath sounds normal. No respiratory distress. He has no wheezes. He has no rales.  Musculoskeletal: He exhibits no edema and no tenderness.  Lymphadenopathy:    He has no cervical adenopathy.  Neurological: No cranial nerve deficit. He exhibits normal muscle  tone. Coordination normal.  No focal weakness  Psychiatric: He has a normal mood and affect. His behavior is normal.          Assessment & Plan:

## 2012-12-25 NOTE — Telephone Encounter (Signed)
On and off for 2 weeks pt has dizziness, worse when looks up or gets up out of chair but also happens on and off when walking. NO h/a or CP. Pt scheduled appt to see Dr Alphonsus Sias today at 11:45 am.

## 2012-12-25 NOTE — Telephone Encounter (Signed)
See OV note.  

## 2012-12-25 NOTE — Assessment & Plan Note (Addendum)
Doesn't seem to be vestibular Not orthostatic---- lying BP 156/86 and pulse 66. Standing 144/92 and pulse 72 Neurologic exam is benign--nothing to suggest cerebral ischemia  ?BP not properly controlled. But he feels it is low the few times he checks  P:  I have asked him to keep log of events and BP then       No med changes for now

## 2012-12-25 NOTE — Patient Instructions (Signed)
Please keep a log of dizzy spells---when it happens and how long. What it feels like and blood pressure if you are able to check it.

## 2013-01-24 ENCOUNTER — Ambulatory Visit (INDEPENDENT_AMBULATORY_CARE_PROVIDER_SITE_OTHER): Payer: Medicare Other | Admitting: Internal Medicine

## 2013-01-24 ENCOUNTER — Encounter: Payer: Self-pay | Admitting: Internal Medicine

## 2013-01-24 VITALS — BP 142/88 | HR 53 | Temp 98.3°F | Ht 69.0 in | Wt 181.2 lb

## 2013-01-24 DIAGNOSIS — E785 Hyperlipidemia, unspecified: Secondary | ICD-10-CM

## 2013-01-24 DIAGNOSIS — Z0000 Encounter for general adult medical examination without abnormal findings: Secondary | ICD-10-CM

## 2013-01-24 DIAGNOSIS — I10 Essential (primary) hypertension: Secondary | ICD-10-CM

## 2013-01-24 DIAGNOSIS — E119 Type 2 diabetes mellitus without complications: Secondary | ICD-10-CM

## 2013-01-24 LAB — LIPID PANEL
Cholesterol: 156 mg/dL (ref 0–200)
HDL: 45.6 mg/dL (ref >39.00)
Total CHOL/HDL Ratio: 3
Triglycerides: 172 mg/dL — ABNORMAL HIGH (ref 0.0–149.0)

## 2013-01-24 LAB — CBC WITH DIFFERENTIAL/PLATELET
Basophils Absolute: 0 10*3/uL (ref 0.0–0.1)
Eosinophils Absolute: 0.2 10*3/uL (ref 0.0–0.7)
Lymphocytes Relative: 19.6 % (ref 12.0–46.0)
MCHC: 33.9 g/dL (ref 30.0–36.0)
MCV: 90.3 fl (ref 78.0–100.0)
Monocytes Absolute: 0.5 10*3/uL (ref 0.1–1.0)
Neutrophils Relative %: 68.6 % (ref 43.0–77.0)
Platelets: 201 10*3/uL (ref 150.0–400.0)
RBC: 4.67 Mil/uL (ref 4.22–5.81)
RDW: 13.4 % (ref 11.5–14.6)

## 2013-01-24 LAB — TSH: TSH: 1.26 u[IU]/mL (ref 0.35–5.50)

## 2013-01-24 LAB — HEPATIC FUNCTION PANEL
ALT: 30 U/L (ref 0–53)
Bilirubin, Direct: 0 mg/dL (ref 0.0–0.3)
Total Bilirubin: 0.7 mg/dL (ref 0.3–1.2)

## 2013-01-24 LAB — BASIC METABOLIC PANEL
BUN: 25 mg/dL — ABNORMAL HIGH (ref 6–23)
CO2: 27 mEq/L (ref 19–32)
Calcium: 10 mg/dL (ref 8.4–10.5)
Chloride: 107 mEq/L (ref 96–112)
Creatinine, Ser: 1.3 mg/dL (ref 0.4–1.5)
Glucose, Bld: 129 mg/dL — ABNORMAL HIGH (ref 70–99)

## 2013-01-24 LAB — HM DIABETES FOOT EXAM

## 2013-01-24 NOTE — Progress Notes (Signed)
Subjective:    Patient ID: Tony Jackson., male    DOB: 1940/07/17, 72 y.o.   MRN: [id number]  HPI Here for physical  Still has some dizziness Notices it if he gets up quickly---does okay if he just doesn't jump up Continues to walk regularly--- will feel staggery at times  Checks sugars daily or close to that Running high in past couple of days--- 220 last night 135 in AM but not really checking fastings No hypoglycemic reactions No pain, numbness or sores in feet. Partially ingrown nail though  Current Outpatient Prescriptions on File Prior to Visit  Medication Sig Dispense Refill  . glipiZIDE (GLIPIZIDE XL) 5 MG 24 hr tablet Take 1 tablet (5 mg total) by mouth daily.  90 tablet  3  . lisinopril (PRINIVIL,ZESTRIL) 10 MG tablet Take 1 tablet (10 mg total) by mouth daily.  90 tablet  3  . metFORMIN (GLUCOPHAGE) 1000 MG tablet Take 1 tablet (1,000 mg total) by mouth 2 (two) times daily with a meal.  180 tablet  3  . mometasone (ELOCON) 0.1 % cream Apply 1 application topically 2 (two) times daily as needed.  45 g  3  . sildenafil (VIAGRA) 100 MG tablet Take 50-100 mg by mouth daily as needed.       . simvastatin (ZOCOR) 40 MG tablet Take 1 tablet (40 mg total) by mouth at bedtime.  90 tablet  3   No current facility-administered medications on file prior to visit.    No Known Allergies  Past Medical History  Diagnosis Date  . Diabetes mellitus   . Hyperlipidemia   . Psoriasis   . Hypertension   . ED (erectile dysfunction)   . History of nephrolithiasis   . Elevated PSA   . Frequency of urination     Past Surgical History  Procedure Laterality Date  . Tonsillectomy    . Nm myoview ltd      normal EF 56% 03/08  . Prostate biopsy  01/05/2012    Procedure: BIOPSY TRANSRECTAL ULTRASONIC PROSTATE (TUBP);  Surgeon: Crecencio Mc, MD;  Location: [facility];  Service: Urology;  Laterality: N/A;  . Cataract extraction w/ intraocular lens implant Right 03/30/12      Dr Dagoberto Ligas    Family History  Problem Relation Age of Onset  . Cancer Mother     breast cancer  . Diabetes Mother   . Heart disease Neg Hx     History   Social History  . Marital Status: Married    Spouse Name: N/A    Number of Children: 2  . Years of Education: N/A   Occupational History  . Holiday representative- paving    Social History Main Topics  . Smoking status: Former Smoker -- 1.00 packs/day for 5 years    Types: Cigarettes  . Smokeless tobacco: Current User    Types: Chew     Comment: QUIT SMOKING CIGARETTES 50 YRS AGO--  CHEWED TOBACCO FOR 40 YRS  . Alcohol Use: No  . Drug Use: No  . Sexual Activity: Not Currently   Other Topics Concern  . Not on file   Social History Narrative   No living will   Requests wife as health care POA.   Would accept resuscitation attempts   Not sure about tube feeds   Review of Systems  Constitutional: Negative for fatigue and unexpected weight change.       Wears seat belt  HENT: Negative for congestion, dental problem,  hearing loss, rhinorrhea and tinnitus.        Regular with dentist  Eyes: Negative for visual disturbance.       No diplopia or unilateral vision loss  Respiratory: Positive for cough. Negative for chest tightness and shortness of breath.   Cardiovascular: Positive for palpitations. Negative for chest pain and leg swelling.       Occ "funny" feeling in chest---goes away with big breath  Gastrointestinal: Negative for nausea, vomiting, abdominal pain, constipation and blood in stool.       No heartburn  Endocrine: Negative for cold intolerance and heat intolerance.  Genitourinary: Negative for urgency, frequency and difficulty urinating.       Still sees Dr Laverle Patter No sex  Musculoskeletal: Positive for back pain. Negative for arthralgias and joint swelling.  Skin: Negative for rash.  Allergic/Immunologic: Negative for environmental allergies and immunocompromised state.  Neurological: Positive for  dizziness and headaches. Negative for syncope, light-headedness and numbness.       Rare headache--asa will helps  Hematological: Negative for adenopathy. Does not bruise/bleed easily.  Psychiatric/Behavioral: Negative for sleep disturbance and dysphoric mood. The patient is not nervous/anxious.        Objective:   Physical Exam  Constitutional: He is oriented to person, place, and time. He appears well-developed and well-nourished. No distress.  HENT:  Head: Normocephalic and atraumatic.  Right Ear: External ear normal.  Left Ear: External ear normal.  Mouth/Throat: Oropharynx is clear and moist. No oropharyngeal exudate.  Eyes: Conjunctivae and EOM are normal.  Left pupil larger than the right  Neck: Normal range of motion. Neck supple. No thyromegaly present.  Cardiovascular: Normal rate, regular rhythm, normal heart sounds and intact distal pulses.  Exam reveals no gallop.   No murmur heard. Pulmonary/Chest: Effort normal and breath sounds normal. No respiratory distress. He has no wheezes. He has no rales.  Abdominal: Soft. There is no tenderness.  Musculoskeletal: He exhibits no edema and no tenderness.  Lymphadenopathy:    He has no cervical adenopathy.  Neurological: He is alert and oriented to person, place, and time.  President  "[employer]illips [employer]or, ?" [id number] Recall  2/3  Normal sensation on plantar feet  Skin: Skin is warm.  Scattered psoriatic plaques No foot ulcers or lesions  Psychiatric: He has a normal mood and affect. His behavior is normal.          Assessment & Plan:

## 2013-01-24 NOTE — Progress Notes (Signed)
Pre-visit discussion using our clinic review tool. No additional management support is needed unless otherwise documented below in the visit note.  

## 2013-01-24 NOTE — Assessment & Plan Note (Signed)
Doing well Gets PSA with Dr Laverle Patter due to past PIN UTD otherwise Flu shot today

## 2013-01-24 NOTE — Assessment & Plan Note (Signed)
Seems to have okay control Due for labs

## 2013-01-24 NOTE — Assessment & Plan Note (Signed)
No problems with statin Due for labs 

## 2013-01-24 NOTE — Assessment & Plan Note (Signed)
BP Readings from Last 3 Encounters:  01/24/13 142/88  12/25/12 148/90  07/26/12 120/78   Reasonable control No changes

## 2013-01-28 ENCOUNTER — Encounter: Payer: Self-pay | Admitting: *Deleted

## 2013-03-19 ENCOUNTER — Other Ambulatory Visit: Payer: Self-pay | Admitting: *Deleted

## 2013-03-19 MED ORDER — LISINOPRIL 10 MG PO TABS: 10.0000 mg | ORAL_TABLET | Freq: Every day | ORAL | Status: DC

## 2013-03-19 MED ORDER — SIMVASTATIN 40 MG PO TABS: 40.0000 mg | ORAL_TABLET | Freq: Every day | ORAL | Status: DC

## 2013-03-19 MED ORDER — MOMETASONE FUROATE 0.1 % EX CREA: 1.0000 "application " | TOPICAL_CREAM | Freq: Two times a day (BID) | CUTANEOUS | Status: DC | PRN

## 2013-03-19 MED ORDER — METFORMIN HCL 1000 MG PO TABS: 1000.0000 mg | ORAL_TABLET | Freq: Two times a day (BID) | ORAL | Status: DC

## 2013-03-19 MED ORDER — GLIPIZIDE ER 5 MG PO TB24: 5.0000 mg | ORAL_TABLET | Freq: Every day | ORAL | Status: DC

## 2013-03-25 ENCOUNTER — Telehealth: Payer: Self-pay | Admitting: *Deleted

## 2013-03-25 NOTE — Telephone Encounter (Signed)
Pt left voicemail with Triage requesting that we refill his Rxs through [facility] order pharmacy, saying that the pharmacy has sent [place] several request.  Called pt to let him know Rxs were filled last month but no answer and pt's didn't have a voicemail

## 2013-03-27 NOTE — Telephone Encounter (Signed)
Spoke with patient and advised that Tony Jackson did receive his refills on 03/19/13 and we have a confirmed receipt, he will call [employer] and let us know.

## 2013-04-07 LAB — HM DIABETES EYE EXAM

## 2013-06-04 ENCOUNTER — Encounter: Payer: Self-pay | Admitting: Internal Medicine

## 2013-07-20 ENCOUNTER — Encounter: Payer: Self-pay | Admitting: Internal Medicine

## 2013-07-25 ENCOUNTER — Encounter: Payer: Self-pay | Admitting: Internal Medicine

## 2013-07-25 ENCOUNTER — Ambulatory Visit (INDEPENDENT_AMBULATORY_CARE_PROVIDER_SITE_OTHER): Payer: Medicare HMO | Admitting: Internal Medicine

## 2013-07-25 VITALS — BP 120/70 | HR 60 | Temp 98.3°F | Wt 176.0 lb

## 2013-07-25 DIAGNOSIS — L219 Seborrheic dermatitis, unspecified: Secondary | ICD-10-CM | POA: Insufficient documentation

## 2013-07-25 DIAGNOSIS — E785 Hyperlipidemia, unspecified: Secondary | ICD-10-CM

## 2013-07-25 DIAGNOSIS — I10 Essential (primary) hypertension: Secondary | ICD-10-CM

## 2013-07-25 DIAGNOSIS — E119 Type 2 diabetes mellitus without complications: Secondary | ICD-10-CM

## 2013-07-25 LAB — HEMOGLOBIN A1C: Hgb A1c MFr Bld: 7.5 % — ABNORMAL HIGH (ref 4.6–6.5)

## 2013-07-25 MED ORDER — TRIAMCINOLONE ACETONIDE 0.1 % EX CREA: 1.0000 "application " | TOPICAL_CREAM | Freq: Two times a day (BID) | CUTANEOUS | Status: DC | PRN

## 2013-07-25 MED ORDER — KETOCONAZOLE 2 % EX SHAM: 1.0000 "application " | MEDICATED_SHAMPOO | CUTANEOUS | Status: DC

## 2013-07-25 NOTE — Assessment & Plan Note (Signed)
Hopefully still good control Due for A1c

## 2013-07-25 NOTE — Progress Notes (Signed)
Subjective:    Patient ID: Tony Jackson., male    DOB: [birth year], 73 y.o.   MRN: [id number]  HPI Doing well No new concerns  Still gets occasional dizziness Not really orthostatic May occur if looking up for a bit  No chest pain No SOB Inconsistent with exercise lately---generally goes to gym 3 days per week  Checks sugars rarely Random yesterday 150 No hypoglycemic reactions No sores or pain in feet  Current Outpatient Prescriptions on File Prior to Visit  Medication Sig Dispense Refill  . glipiZIDE (GLIPIZIDE XL) 5 MG 24 hr tablet Take 1 tablet (5 mg total) by mouth daily.  90 tablet  3  . lisinopril (PRINIVIL,ZESTRIL) 10 MG tablet Take 1 tablet (10 mg total) by mouth daily.  90 tablet  3  . metFORMIN (GLUCOPHAGE) 1000 MG tablet Take 1 tablet (1,000 mg total) by mouth 2 (two) times daily with a meal.  180 tablet  3  . mometasone (ELOCON) 0.1 % cream Apply 1 application topically 2 (two) times daily as needed.  45 g  3  . sildenafil (VIAGRA) 100 MG tablet Take 50-100 mg by mouth daily as needed.       . simvastatin (ZOCOR) 40 MG tablet Take 1 tablet (40 mg total) by mouth at bedtime.  90 tablet  3   No current facility-administered medications on file prior to visit.    No Known Allergies  Past Medical History  Diagnosis Date  . Diabetes mellitus   . Hyperlipidemia   . Psoriasis   . Hypertension   . ED (erectile dysfunction)   . History of nephrolithiasis   . Elevated PSA   . Frequency of urination     Past Surgical History  Procedure Laterality Date  . Tonsillectomy    . Nm myoview ltd      normal EF 56% 03/08  . Prostate biopsy  01/05/2012    Procedure: BIOPSY TRANSRECTAL ULTRASONIC PROSTATE (TUBP);  Surgeon: [name], MD;  Location: [facility];  Service: Urology;  Laterality: N/A;  . Cataract extraction w/ intraocular lens implant Right 03/30/12    Dr Kathrin Penner    Family History  Problem Relation Age of Onset  . Cancer Mother       breast cancer  . Diabetes Mother   . Heart disease Neg Hx     History   Social History  . Marital Status: Married    Spouse Name: N/A    Number of Children: 2  . Years of Education: N/A   Occupational History  . Architect- paving    Social History Main Topics  . Smoking status: Former Smoker -- 1.00 packs/day for 5 years    Types: Cigarettes  . Smokeless tobacco: Current User    Types: Chew     Comment: QUIT SMOKING CIGARETTES 50 YRS AGO--  CHEWED TOBACCO FOR 40 YRS  . Alcohol Use: No  . Drug Use: No  . Sexual Activity: Not Currently   Other Topics Concern  . Not on file   Social History Narrative   No living will   Requests wife as health care POA.   Would accept resuscitation attempts   Not sure about tube feeds   Review of Systems Dandruff in scalp---uses T gel. Has red rash in T distribution in face Weight down 5#    Objective:   Physical Exam  Constitutional: He appears well-developed and well-nourished. No distress.  Neck: Normal range of motion. Neck supple. No  thyromegaly present.  Cardiovascular: Normal rate, regular rhythm, normal heart sounds and intact distal pulses.  Exam reveals no gallop.   No murmur heard. Pulmonary/Chest: Effort normal and breath sounds normal. No respiratory distress. He has no wheezes. He has no rales.  Lymphadenopathy:    He has no cervical adenopathy.  Skin: Rash noted.  Facial seborrhea Scaling areas on scalp also No foot lesions  Psychiatric: He has a normal mood and affect. His behavior is normal.          Assessment & Plan:

## 2013-07-25 NOTE — Assessment & Plan Note (Signed)
Scalp and face look like this ---not psoriasis Will Rx shampoo and cream

## 2013-07-25 NOTE — Progress Notes (Signed)
Pre visit review using our clinic review tool, if applicable. No additional management support is needed unless otherwise documented below in the visit note. 

## 2013-07-25 NOTE — Assessment & Plan Note (Signed)
Lab Results  Component Value Date   LDLCALC 76 01/24/2013   At goal on med

## 2013-07-25 NOTE — Assessment & Plan Note (Signed)
BP Readings from Last 3 Encounters:  07/25/13 120/70  01/24/13 142/88  12/25/12 148/90   Good control now

## 2013-07-28 ENCOUNTER — Telehealth: Payer: Self-pay | Admitting: Internal Medicine

## 2013-07-28 NOTE — Telephone Encounter (Signed)
Relevant patient education mailed to patient.  

## 2013-07-29 ENCOUNTER — Encounter: Payer: Self-pay | Admitting: Family Medicine

## 2013-09-26 ENCOUNTER — Ambulatory Visit (INDEPENDENT_AMBULATORY_CARE_PROVIDER_SITE_OTHER): Payer: Medicare HMO | Admitting: Emergency Medicine

## 2013-09-26 VITALS — BP 120/80 | HR 73 | Temp 98.1°F | Resp 16 | Ht 68.0 in | Wt 174.4 lb

## 2013-09-26 DIAGNOSIS — L600 Ingrowing nail: Secondary | ICD-10-CM

## 2013-09-26 MED ORDER — SULFAMETHOXAZOLE-TMP DS 800-160 MG PO TABS: 1.0000 | ORAL_TABLET | Freq: Two times a day (BID) | ORAL | Status: DC

## 2013-09-26 MED ORDER — ACETAMINOPHEN-CODEINE #3 300-30 MG PO TABS: 1.0000 | ORAL_TABLET | ORAL | Status: DC | PRN

## 2013-09-26 NOTE — Progress Notes (Signed)
[facility] [address] [place] [address] [phone]  Date:  09/26/2013   Name:  Tony Jackson.   DOB:  [birth year]   MRN:  [id number]  PCP:  Viviana Simpler, MD    Chief Complaint: Ingrown Toenail   History of Present Illness:  Tony Jackson. is a 73 y.o. very pleasant male patient who presents with the following:  Has an ingrown nail on lateral nail fold right great toe. Pain and swelling.  Attempted surgery on it last night with a poor result. Denies other complaint or health concern today.   Patient Active Problem List   Diagnosis Date Noted  . Seborrhea 07/25/2013  . Dizziness 12/25/2012  . Prostatic intraepithelial neoplasia 04/12/2012  . Actinic keratosis 10/12/2011  . Routine general medical examination at a health care facility 10/07/2010  . NEPHROLITHIASIS, HX OF 03/18/2010  . ERECTILE DYSFUNCTION, ORGANIC 02/19/2008  . HYPERTENSION 12/07/2006  . DIABETES MELLITUS, TYPE II 11/13/2006  . HYPERLIPIDEMIA 11/13/2006  . PSORIASIS 11/13/2006    Past Medical History  Diagnosis Date  . Diabetes mellitus   . Hyperlipidemia   . Psoriasis   . Hypertension   . ED (erectile dysfunction)   . History of nephrolithiasis   . Elevated PSA   . Frequency of urination     Past Surgical History  Procedure Laterality Date  . Tonsillectomy    . Nm myoview ltd      normal EF 56% 03/08  . Prostate biopsy  01/05/2012    Procedure: BIOPSY TRANSRECTAL ULTRASONIC PROSTATE (TUBP);  Surgeon: [name], MD;  Location: [facility];  Service: Urology;  Laterality: N/A;  . Cataract extraction w/ intraocular lens implant Right 03/30/12    Dr Kathrin Penner    History  Substance Use Topics  . Smoking status: Former Smoker -- 1.00 packs/day for 5 years    Types: Cigarettes  . Smokeless tobacco: Current User    Types: Chew     Comment: QUIT SMOKING CIGARETTES 50 YRS AGO--  CHEWED TOBACCO FOR 40 YRS  . Alcohol Use: No    Family History   Problem Relation Age of Onset  . Cancer Mother     breast cancer  . Diabetes Mother   . Heart disease Neg Hx     No Known Allergies  Medication list has been reviewed and updated.  Current Outpatient Prescriptions on File Prior to Visit  Medication Sig Dispense Refill  . glipiZIDE (GLIPIZIDE XL) 5 MG 24 hr tablet Take 1 tablet (5 mg total) by mouth daily.  90 tablet  3  . ketoconazole (NIZORAL) 2 % shampoo Apply 1 application topically 2 (two) times a week.  120 mL  5  . lisinopril (PRINIVIL,ZESTRIL) 10 MG tablet Take 1 tablet (10 mg total) by mouth daily.  90 tablet  3  . metFORMIN (GLUCOPHAGE) 1000 MG tablet Take 1 tablet (1,000 mg total) by mouth 2 (two) times daily with a meal.  180 tablet  3  . mometasone (ELOCON) 0.1 % cream Apply 1 application topically 2 (two) times daily as needed.  45 g  3  . sildenafil (VIAGRA) 100 MG tablet Take 50-100 mg by mouth daily as needed.       . simvastatin (ZOCOR) 40 MG tablet Take 1 tablet (40 mg total) by mouth at bedtime.  90 tablet  3  . triamcinolone cream (KENALOG) 0.1 % Apply 1 application topically 2 (two) times daily as needed.  [place]  g  3   No current facility-administered medications on file prior to visit.    Review of Systems:  As per HPI, otherwise negative.    Physical Examination: Filed Vitals:   09/26/13 0927  BP: 120/80  Pulse: 73  Temp: 98.1 F (36.7 C)  Resp: 16   Filed Vitals:   09/26/13 0927  Height: 5\' 8"  (1.727 m)  Weight: 174 lb 6.4 oz (79.107 kg)   Body mass index is 26.52 kg/(m^2). Ideal Body Weight: Weight in (lb) to have BMI = 25: 164.1   GEN: WDWN, NAD, Non-toxic, Alert & Oriented x 3 HEENT: Atraumatic, Normocephalic.  Ears and Nose: No external deformity. EXTR: No clubbing/cyanosis/edema NEURO: Normal gait.  PSYCH: Normally interactive. Conversant. Not depressed or anxious appearing.  Calm demeanor.  RIGHT foot:  great toenail ingrown.  Mild cellulitis  Assessment and Plan: Ingrown  nail Septra tyl #3  Signed,  Ellison Carwin, MD

## 2013-09-26 NOTE — Patient Instructions (Signed)
Infected Ingrown Toenail °An infected ingrown toenail occurs when the nail edge grows into the skin and bacteria invade the area. Symptoms include pain, tenderness, swelling, and pus drainage from the edge of the nail. Poorly fitting shoes, minor injuries, and improper cutting of the toenail may also contribute to the problem. You should cut your toenails squarely instead of rounding the edges. Do not cut them too short. Avoid tight or pointed toe shoes. Sometimes the ingrown portion of the nail must be removed. If your toenail is removed, it can take 3-4 months for it to re-grow. °HOME CARE INSTRUCTIONS  °· Soak your infected toe in warm water for 20-30 minutes, 2 to 3 times a day. °· Packing or dressings applied to the area should be changed daily. °· Take medicine as directed and finish them. °· Reduce activities and keep your foot elevated when able to reduce swelling and discomfort. Do this until the infection gets better. °· Wear sandals or go barefoot as much as possible while the infected area is sensitive. °· See your caregiver for follow-up care in 2-3 days if the infection is not better. °SEEK MEDICAL CARE IF:  °Your toe is becoming more red, swollen or painful. °MAKE SURE YOU:  °· Understand these instructions. °· Will watch your condition. °· Will get help right away if you are not doing well or get worse. °Document Released: 03/16/2004 Document Revised: 05/01/2011 Document Reviewed: 02/03/2008 °ExitCare® Patient Information ©2015 [employer]. This information is not intended to replace advice given to you by your health care provider. Make sure you discuss any questions you have with your health care provider. ° °

## 2013-09-26 NOTE — Progress Notes (Signed)
   Patient ID: Tony Jackson. MRN: [id number], DOB: 05-Dec-1940, 73 y.o. Date of Encounter: 09/26/2013, 11:21 AM   PROCEDURE NOTE: Verbal consent obtained.  Risks and benefits explained.  Patient made informed decision under sound mind and judgement. Sterile technique employed. Numbing: Anesthesia obtained with 1:1 ratio 2% plain lidocaine with 0.5% Marcaine, 4 cc total. Betadine prep per usual protocol.  Lateral nail lifted without difficulty and removed in total. Proximal aspect of nail bed explored revealing no nail remnants. Ingrown tissue debrided and irrigated. No active bleeding. Xeroform dressing applied. Wound care instructions including precautions covered with patient. Handout given.   Signed,  Georgiann Mccoy, PA-C [facility] [place] [address] [facility] 09/26/2013 11:21 AM

## 2013-10-07 ENCOUNTER — Encounter (HOSPITAL_COMMUNITY): Payer: Self-pay | Admitting: Emergency Medicine

## 2013-10-07 ENCOUNTER — Inpatient Hospital Stay (HOSPITAL_COMMUNITY)
Admission: EM | Admit: 2013-10-07 | Discharge: 2013-10-10 | DRG: 871 | Disposition: A | Discharge status: Home / Self Care | Payer: Medicare HMO | Attending: Internal Medicine | Admitting: Internal Medicine

## 2013-10-07 DIAGNOSIS — E1129 Type 2 diabetes mellitus with other diabetic kidney complication: Secondary | ICD-10-CM | POA: Diagnosis present

## 2013-10-07 DIAGNOSIS — E1165 Type 2 diabetes mellitus with hyperglycemia: Secondary | ICD-10-CM

## 2013-10-07 DIAGNOSIS — E872 Acidosis, unspecified: Secondary | ICD-10-CM | POA: Diagnosis present

## 2013-10-07 DIAGNOSIS — R6520 Severe sepsis without septic shock: Secondary | ICD-10-CM

## 2013-10-07 DIAGNOSIS — R55 Syncope and collapse: Secondary | ICD-10-CM

## 2013-10-07 DIAGNOSIS — R911 Solitary pulmonary nodule: Secondary | ICD-10-CM

## 2013-10-07 DIAGNOSIS — Z87891 Personal history of nicotine dependence: Secondary | ICD-10-CM

## 2013-10-07 DIAGNOSIS — R6510 Systemic inflammatory response syndrome (SIRS) of non-infectious origin without acute organ dysfunction: Secondary | ICD-10-CM | POA: Diagnosis present

## 2013-10-07 DIAGNOSIS — IMO0001 Reserved for inherently not codable concepts without codable children: Secondary | ICD-10-CM | POA: Diagnosis present

## 2013-10-07 DIAGNOSIS — A770 Spotted fever due to Rickettsia rickettsii: Secondary | ICD-10-CM | POA: Diagnosis present

## 2013-10-07 DIAGNOSIS — A419 Sepsis, unspecified organism: Principal | ICD-10-CM | POA: Diagnosis present

## 2013-10-07 DIAGNOSIS — E119 Type 2 diabetes mellitus without complications: Secondary | ICD-10-CM

## 2013-10-07 DIAGNOSIS — I10 Essential (primary) hypertension: Secondary | ICD-10-CM | POA: Diagnosis present

## 2013-10-07 DIAGNOSIS — IMO0002 Reserved for concepts with insufficient information to code with codable children: Secondary | ICD-10-CM

## 2013-10-07 DIAGNOSIS — I951 Orthostatic hypotension: Secondary | ICD-10-CM

## 2013-10-07 DIAGNOSIS — R42 Dizziness and giddiness: Secondary | ICD-10-CM

## 2013-10-07 DIAGNOSIS — A779 Spotted fever, unspecified: Secondary | ICD-10-CM | POA: Diagnosis present

## 2013-10-07 DIAGNOSIS — N179 Acute kidney failure, unspecified: Secondary | ICD-10-CM

## 2013-10-07 DIAGNOSIS — E118 Type 2 diabetes mellitus with unspecified complications: Secondary | ICD-10-CM

## 2013-10-07 DIAGNOSIS — Z833 Family history of diabetes mellitus: Secondary | ICD-10-CM

## 2013-10-07 DIAGNOSIS — R6521 Severe sepsis with septic shock: Secondary | ICD-10-CM

## 2013-10-07 DIAGNOSIS — D696 Thrombocytopenia, unspecified: Secondary | ICD-10-CM | POA: Diagnosis present

## 2013-10-07 DIAGNOSIS — E785 Hyperlipidemia, unspecified: Secondary | ICD-10-CM | POA: Diagnosis present

## 2013-10-07 DIAGNOSIS — R7989 Other specified abnormal findings of blood chemistry: Secondary | ICD-10-CM

## 2013-10-07 DIAGNOSIS — N189 Chronic kidney disease, unspecified: Secondary | ICD-10-CM | POA: Diagnosis present

## 2013-10-07 DIAGNOSIS — L408 Other psoriasis: Secondary | ICD-10-CM | POA: Diagnosis present

## 2013-10-07 DIAGNOSIS — Z803 Family history of malignant neoplasm of breast: Secondary | ICD-10-CM

## 2013-10-07 DIAGNOSIS — I959 Hypotension, unspecified: Secondary | ICD-10-CM | POA: Diagnosis present

## 2013-10-07 MED ORDER — SODIUM CHLORIDE 0.9 % IV BOLUS (SEPSIS)
1000.0000 mL | Freq: Once | INTRAVENOUS | Status: AC
  Administered 2013-10-07: 1000 mL via INTRAVENOUS

## 2013-10-07 NOTE — ED Notes (Signed)
Pt here with c/o dizziness that started 3-4 days ago. Pt was playing softball when he became dizzy and called EMS. Pt alert and oriented. Denies any sob, cp but does state he has some abd pain. Per EMS pt was orthostatic, Supine 92/68 with HR 138, standing 70/50 with HR 150. Pt received 300 ML fluids en route. 12 Lead showed accelerated junction. Pt reports issues with dizziness in the past but no diagnosis for why. Pt denies any heart problems.

## 2013-10-08 ENCOUNTER — Inpatient Hospital Stay (HOSPITAL_COMMUNITY): Payer: Medicare HMO

## 2013-10-08 ENCOUNTER — Encounter (HOSPITAL_COMMUNITY): Payer: Self-pay | Admitting: Internal Medicine

## 2013-10-08 ENCOUNTER — Emergency Department (HOSPITAL_COMMUNITY): Payer: Medicare HMO

## 2013-10-08 DIAGNOSIS — E119 Type 2 diabetes mellitus without complications: Secondary | ICD-10-CM

## 2013-10-08 DIAGNOSIS — D696 Thrombocytopenia, unspecified: Secondary | ICD-10-CM | POA: Diagnosis present

## 2013-10-08 DIAGNOSIS — Z833 Family history of diabetes mellitus: Secondary | ICD-10-CM | POA: Diagnosis not present

## 2013-10-08 DIAGNOSIS — A779 Spotted fever, unspecified: Secondary | ICD-10-CM | POA: Diagnosis present

## 2013-10-08 DIAGNOSIS — R6520 Severe sepsis without septic shock: Secondary | ICD-10-CM | POA: Diagnosis present

## 2013-10-08 DIAGNOSIS — I2510 Atherosclerotic heart disease of native coronary artery without angina pectoris: Secondary | ICD-10-CM | POA: Insufficient documentation

## 2013-10-08 DIAGNOSIS — E785 Hyperlipidemia, unspecified: Secondary | ICD-10-CM | POA: Diagnosis present

## 2013-10-08 DIAGNOSIS — N179 Acute kidney failure, unspecified: Secondary | ICD-10-CM | POA: Diagnosis present

## 2013-10-08 DIAGNOSIS — E872 Acidosis, unspecified: Secondary | ICD-10-CM

## 2013-10-08 DIAGNOSIS — R55 Syncope and collapse: Secondary | ICD-10-CM | POA: Diagnosis present

## 2013-10-08 DIAGNOSIS — R6510 Systemic inflammatory response syndrome (SIRS) of non-infectious origin without acute organ dysfunction: Secondary | ICD-10-CM | POA: Diagnosis present

## 2013-10-08 DIAGNOSIS — R911 Solitary pulmonary nodule: Secondary | ICD-10-CM | POA: Diagnosis present

## 2013-10-08 DIAGNOSIS — N189 Chronic kidney disease, unspecified: Secondary | ICD-10-CM | POA: Diagnosis present

## 2013-10-08 DIAGNOSIS — A419 Sepsis, unspecified organism: Secondary | ICD-10-CM | POA: Diagnosis present

## 2013-10-08 DIAGNOSIS — Z87891 Personal history of nicotine dependence: Secondary | ICD-10-CM | POA: Diagnosis not present

## 2013-10-08 DIAGNOSIS — IMO0001 Reserved for inherently not codable concepts without codable children: Secondary | ICD-10-CM | POA: Diagnosis present

## 2013-10-08 DIAGNOSIS — L408 Other psoriasis: Secondary | ICD-10-CM | POA: Diagnosis present

## 2013-10-08 DIAGNOSIS — I959 Hypotension, unspecified: Secondary | ICD-10-CM

## 2013-10-08 DIAGNOSIS — I10 Essential (primary) hypertension: Secondary | ICD-10-CM | POA: Diagnosis present

## 2013-10-08 DIAGNOSIS — Z803 Family history of malignant neoplasm of breast: Secondary | ICD-10-CM | POA: Diagnosis not present

## 2013-10-08 LAB — URINE MICROSCOPIC-ADD ON

## 2013-10-08 LAB — D-DIMER, QUANTITATIVE: D-Dimer, Quant: 1.62 ug/mL-FEU — ABNORMAL HIGH (ref 0.00–0.48)

## 2013-10-08 LAB — HEPATIC FUNCTION PANEL
ALT: 89 U/L — ABNORMAL HIGH (ref 0–53)
AST: 70 U/L — AB (ref 0–37)
Albumin: 3.3 g/dL — ABNORMAL LOW (ref 3.5–5.2)
Alkaline Phosphatase: 50 U/L (ref 39–117)
Bilirubin, Direct: 0.2 mg/dL (ref 0.0–0.3)
Indirect Bilirubin: 0.7 mg/dL (ref 0.3–0.9)
TOTAL PROTEIN: 5.8 g/dL — AB (ref 6.0–8.3)
Total Bilirubin: 0.9 mg/dL (ref 0.3–1.2)

## 2013-10-08 LAB — URINALYSIS, ROUTINE W REFLEX MICROSCOPIC
BILIRUBIN URINE: NEGATIVE
Glucose, UA: >1000 mg/dL — AB
Hgb urine dipstick: NEGATIVE
KETONES UR: NEGATIVE mg/dL
LEUKOCYTES UA: NEGATIVE
NITRITE: NEGATIVE
Protein, ur: NEGATIVE mg/dL
Specific Gravity, Urine: 1.023 (ref 1.005–1.030)
Urobilinogen, UA: 0.2 mg/dL (ref 0.0–1.0)
pH: 5 (ref 5.0–8.0)

## 2013-10-08 LAB — CBC WITH DIFFERENTIAL/PLATELET
BASOS ABS: 0 10*3/uL (ref 0.0–0.1)
Basophils Absolute: 0 10*3/uL (ref 0.0–0.1)
Basophils Relative: 1 % (ref 0–1)
Basophils Relative: 1 % (ref 0–1)
EOS PCT: 1 % (ref 0–5)
Eosinophils Absolute: 0.1 10*3/uL (ref 0.0–0.7)
Eosinophils Absolute: 0.1 10*3/uL (ref 0.0–0.7)
Eosinophils Relative: 3 % (ref 0–5)
HCT: 40.1 % (ref 39.0–52.0)
HCT: 36.4 % — ABNORMAL LOW (ref 39.0–52.0)
Hemoglobin: 13.9 g/dL (ref 13.0–17.0)
Hemoglobin: 12.4 g/dL — ABNORMAL LOW (ref 13.0–17.0)
LYMPHS ABS: 0.6 10*3/uL — AB (ref 0.7–4.0)
Lymphocytes Relative: 12 % (ref 12–46)
Lymphocytes Relative: 19 % (ref 12–46)
Lymphs Abs: 0.6 10*3/uL — ABNORMAL LOW (ref 0.7–4.0)
MCH: 31.1 pg (ref 26.0–34.0)
MCH: 31.2 pg (ref 26.0–34.0)
MCHC: 34.7 g/dL (ref 30.0–36.0)
MCHC: 34.1 g/dL (ref 30.0–36.0)
MCV: 89.7 fL (ref 78.0–100.0)
MCV: 91.5 fL (ref 78.0–100.0)
MONO ABS: 0.3 10*3/uL (ref 0.1–1.0)
Monocytes Absolute: 0.6 10*3/uL (ref 0.1–1.0)
Monocytes Relative: 12 % (ref 3–12)
Monocytes Relative: 11 % (ref 3–12)
NEUTROS ABS: 3.8 10*3/uL (ref 1.7–7.7)
NEUTROS ABS: 2.2 10*3/uL (ref 1.7–7.7)
Neutrophils Relative %: 74 % (ref 43–77)
Neutrophils Relative %: 66 % (ref 43–77)
PLATELETS: 131 10*3/uL — AB (ref 150–400)
PLATELETS: 122 10*3/uL — AB (ref 150–400)
RBC: 4.47 MIL/uL (ref 4.22–5.81)
RBC: 3.98 MIL/uL — ABNORMAL LOW (ref 4.22–5.81)
RDW: 13.8 % (ref 11.5–15.5)
RDW: 14 % (ref 11.5–15.5)
WBC: 5.1 10*3/uL (ref 4.0–10.5)
WBC: 3.2 10*3/uL — ABNORMAL LOW (ref 4.0–10.5)

## 2013-10-08 LAB — LACTATE DEHYDROGENASE: LDH: 339 U/L — ABNORMAL HIGH (ref 94–250)

## 2013-10-08 LAB — TROPONIN I

## 2013-10-08 LAB — CREATININE, URINE, RANDOM: Creatinine, Urine: 155.95 mg/dL

## 2013-10-08 LAB — LIPASE, BLOOD: LIPASE: 75 U/L — AB (ref 11–59)

## 2013-10-08 LAB — BASIC METABOLIC PANEL
Anion gap: 16 — ABNORMAL HIGH (ref 5–15)
Anion gap: 13 (ref 5–15)
BUN: 46 mg/dL — ABNORMAL HIGH (ref 6–23)
BUN: 36 mg/dL — ABNORMAL HIGH (ref 6–23)
CALCIUM: 9.7 mg/dL (ref 8.4–10.5)
CALCIUM: 8.6 mg/dL (ref 8.4–10.5)
CHLORIDE: 99 meq/L (ref 96–112)
CO2: 18 meq/L — AB (ref 19–32)
CO2: 17 mEq/L — ABNORMAL LOW (ref 19–32)
CREATININE: 1.71 mg/dL — AB (ref 0.50–1.35)
Chloride: 107 mEq/L (ref 96–112)
Creatinine, Ser: 2.43 mg/dL — ABNORMAL HIGH (ref 0.50–1.35)
GFR calc Af Amer: 29 mL/min — ABNORMAL LOW (ref >90)
GFR calc Af Amer: 44 mL/min — ABNORMAL LOW (ref >90)
GFR calc non Af Amer: 25 mL/min — ABNORMAL LOW (ref >90)
GFR, EST NON AFRICAN AMERICAN: 38 mL/min — AB (ref >90)
GLUCOSE: 257 mg/dL — AB (ref 70–99)
Glucose, Bld: 176 mg/dL — ABNORMAL HIGH (ref 70–99)
POTASSIUM: 5.4 meq/L — AB (ref 3.7–5.3)
Potassium: 5 mEq/L (ref 3.7–5.3)
SODIUM: 133 meq/L — AB (ref 137–147)
SODIUM: 137 meq/L (ref 137–147)

## 2013-10-08 LAB — T4, FREE: Free T4: 0.78 ng/dL — ABNORMAL LOW (ref 0.80–1.80)

## 2013-10-08 LAB — KETONES, QUALITATIVE: Acetone, Bld: NEGATIVE

## 2013-10-08 LAB — GLUCOSE, CAPILLARY
GLUCOSE-CAPILLARY: 144 mg/dL — AB (ref 70–99)
GLUCOSE-CAPILLARY: 154 mg/dL — AB (ref 70–99)
Glucose-Capillary: 168 mg/dL — ABNORMAL HIGH (ref 70–99)
Glucose-Capillary: 309 mg/dL — ABNORMAL HIGH (ref 70–99)

## 2013-10-08 LAB — SODIUM, URINE, RANDOM: Sodium, Ur: 82 mEq/L

## 2013-10-08 LAB — HEMOGLOBIN A1C
HEMOGLOBIN A1C: 7.6 % — AB (ref <5.7)
MEAN PLASMA GLUCOSE: 171 mg/dL — AB (ref <117)

## 2013-10-08 LAB — PROTIME-INR
INR: 1.1 (ref 0.00–1.49)
Prothrombin Time: 14.2 seconds (ref 11.6–15.2)

## 2013-10-08 LAB — TSH
TSH: 2.69 u[IU]/mL (ref 0.350–4.500)
TSH: 3.11 u[IU]/mL (ref 0.350–4.500)

## 2013-10-08 LAB — APTT: APTT: 34 s (ref 24–37)

## 2013-10-08 LAB — HIV ANTIBODY (ROUTINE TESTING W REFLEX): HIV 1&2 Ab, 4th Generation: NONREACTIVE

## 2013-10-08 LAB — CORTISOL: CORTISOL PLASMA: 22.4 ug/dL

## 2013-10-08 LAB — MRSA PCR SCREENING: MRSA BY PCR: NEGATIVE

## 2013-10-08 LAB — SEDIMENTATION RATE: SED RATE: 4 mm/h (ref 0–16)

## 2013-10-08 LAB — I-STAT CG4 LACTIC ACID, ED: Lactic Acid, Venous: 2.75 mmol/L — ABNORMAL HIGH (ref 0.5–2.2)

## 2013-10-08 LAB — LACTIC ACID, PLASMA: LACTIC ACID, VENOUS: 1.5 mmol/L (ref 0.5–2.2)

## 2013-10-08 LAB — PATHOLOGIST SMEAR REVIEW

## 2013-10-08 LAB — T3, FREE: T3, Free: 2.7 pg/mL (ref 2.3–4.2)

## 2013-10-08 MED ORDER — INSULIN ASPART 100 UNIT/ML ~~LOC~~ SOLN
0.0000 [IU] | Freq: Three times a day (TID) | SUBCUTANEOUS | Status: DC
  Administered 2013-10-08 – 2013-10-09 (×3): 2 [IU] via SUBCUTANEOUS
  Administered 2013-10-09 (×2): 7 [IU] via SUBCUTANEOUS
  Administered 2013-10-10: 1 [IU] via SUBCUTANEOUS
  Administered 2013-10-10: 3 [IU] via SUBCUTANEOUS

## 2013-10-08 MED ORDER — IOHEXOL 300 MG/ML  SOLN
25.0000 mL | INTRAMUSCULAR | Status: AC
  Administered 2013-10-08 (×2): 25 mL via ORAL

## 2013-10-08 MED ORDER — ACETAMINOPHEN 650 MG RE SUPP: 650.0000 mg | Freq: Four times a day (QID) | RECTAL | Status: DC | PRN

## 2013-10-08 MED ORDER — DEXTROSE 5 % IV SOLN
100.0000 mg | Freq: Two times a day (BID) | INTRAVENOUS | Status: DC
  Administered 2013-10-08 – 2013-10-10 (×5): 100 mg via INTRAVENOUS
  Filled 2013-10-08 (×7): qty 100

## 2013-10-08 MED ORDER — ONDANSETRON HCL 4 MG PO TABS: 4.0000 mg | ORAL_TABLET | Freq: Four times a day (QID) | ORAL | Status: DC | PRN

## 2013-10-08 MED ORDER — SODIUM CHLORIDE 0.9 % IV SOLN: Freq: Once | INTRAVENOUS | Status: AC

## 2013-10-08 MED ORDER — TECHNETIUM TC 99M DIETHYLENETRIAME-PENTAACETIC ACID: 40.0000 | Freq: Once | INTRAVENOUS | Status: AC | PRN

## 2013-10-08 MED ORDER — SODIUM CHLORIDE 0.9 % IV SOLN
INTRAVENOUS | Status: DC
  Administered 2013-10-08: 05:00:00 via INTRAVENOUS

## 2013-10-08 MED ORDER — ACETAMINOPHEN 325 MG PO TABS: 650.0000 mg | ORAL_TABLET | Freq: Four times a day (QID) | ORAL | Status: DC | PRN

## 2013-10-08 MED ORDER — ONDANSETRON HCL 4 MG/2ML IJ SOLN
4.0000 mg | Freq: Four times a day (QID) | INTRAMUSCULAR | Status: DC | PRN
  Administered 2013-10-10: 4 mg via INTRAVENOUS
  Filled 2013-10-08: qty 2

## 2013-10-08 MED ORDER — TECHNETIUM TO 99M ALBUMIN AGGREGATED
6.0000 | Freq: Once | INTRAVENOUS | Status: AC | PRN
  Administered 2013-10-08: 6 via INTRAVENOUS

## 2013-10-08 MED ORDER — ENOXAPARIN SODIUM 30 MG/0.3ML ~~LOC~~ SOLN
30.0000 mg | SUBCUTANEOUS | Status: DC
  Administered 2013-10-08 – 2013-10-09 (×2): 30 mg via SUBCUTANEOUS
  Filled 2013-10-08 (×2): qty 0.3

## 2013-10-08 MED ORDER — SODIUM CHLORIDE 0.9 % IV SOLN
INTRAVENOUS | Status: AC
  Administered 2013-10-08 – 2013-10-09 (×2): via INTRAVENOUS

## 2013-10-08 MED ORDER — SODIUM CHLORIDE 0.9 % IV BOLUS (SEPSIS)
1000.0000 mL | Freq: Once | INTRAVENOUS | Status: AC
  Administered 2013-10-08: 1000 mL via INTRAVENOUS

## 2013-10-08 NOTE — Progress Notes (Signed)
Pt just got back to floor from ct of abdomen and had a v-Q scan as well.

## 2013-10-08 NOTE — H&P (Addendum)
[facility] History and Physical  Tony Jackson. POE:[id number] DOB: 1940/11/21 DOA: 10/07/2013  Referring physician: ER physician. PCP: Viviana Simpler, MD   Chief Complaint: Weakness.  HPI: Tony Jackson. is a 73 y.o. male with history of diabetes mellitus, hypertension and hyperlipidemia was advised to come to the ER by patient's PCP office after patient was feeling weak and was found to be hypotensive at his house. Patient states that he has had recently been to [place] last week with his family. Since then patient has been feeling weak and tired. Patient also was recently placed on antibiotics for right toe ingrown nail. Since yesterday morning patient's weakness became more profound. Later in the evening when he was going to play ball game he felt weak and went back home. At his house his blood sugar was found to be around 257 and blood pressure was low in the 31V systolic. His PCPs office advised to go to the ER. In the ER patient's blood pressure was low systolic 40G and tachycardic. Patient was totally given 3 L normal 7 bolus still patient is tachycardic and blood pressure has minimally improved to the 86P systolic. Patient's labs reveal elevated lactic acid levels with acute renal failure and metabolic acidosis. Since patient was tachycardic d-dimer was done which is elevated. Patient states that other than weakness patient has not been having any chest pain shortness of breath. Has had one episode of diarrhea 2 days ago and has been having some lower abdominal discomfort since then but otherwise denies any dysuria or discharges. Denies any fever chills sweating. Patient other than being on Bactrim has not been on any new medications. States that he has had mosquito bites but does not recall having any other insect bite. Patient on exam is nonfocal but appears weak.   Review of Systems: As presented in the history of presenting illness, rest negative.  Past Medical History   Diagnosis Date  . Diabetes mellitus   . Hyperlipidemia   . Psoriasis   . Hypertension   . ED (erectile dysfunction)   . History of nephrolithiasis   . Elevated PSA   . Frequency of urination    Past Surgical History  Procedure Laterality Date  . Tonsillectomy    . Nm myoview ltd      normal EF 56% 03/08  . Prostate biopsy  01/05/2012    Procedure: BIOPSY TRANSRECTAL ULTRASONIC PROSTATE (TUBP);  Surgeon: [name], MD;  Location: [facility];  Service: Urology;  Laterality: N/A;  . Cataract extraction w/ intraocular lens implant Right 03/30/12    Dr Kathrin Penner   Social History:  reports that he has quit smoking. His smoking use included Cigarettes. He has a 5 pack-year smoking history. His smokeless tobacco use includes Chew. He reports that he does not drink alcohol or use illicit drugs. Where does patient live  home.  Can patient participate in ADLs? yes.   No Known Allergies  Family History:  Family History  Problem Relation Age of Onset  . Cancer Mother     breast cancer  . Diabetes Mother   . Heart disease Neg Hx       Prior to Admission medications   Medication Sig Start Date End Date Taking? Authorizing Provider  glipiZIDE (GLIPIZIDE XL) 5 MG 24 hr tablet Take 1 tablet (5 mg total) by mouth daily. 03/19/13  Yes [name], MD  ketoconazole (NIZORAL) 2 % shampoo Apply 1 application topically 2 (two) times a  week. 07/25/13  Yes [name], MD  lisinopril (PRINIVIL,ZESTRIL) 10 MG tablet Take 1 tablet (10 mg total) by mouth daily. 03/19/13  Yes [name], MD  metFORMIN (GLUCOPHAGE) 1000 MG tablet Take 1 tablet (1,000 mg total) by mouth 2 (two) times daily with a meal. 03/19/13  Yes [name], MD  mometasone (ELOCON) 0.1 % cream Apply 1 application topically 2 (two) times daily as needed. 03/19/13  Yes [name], MD  sildenafil (VIAGRA) 100 MG tablet Take 50-100 mg by mouth daily as needed.    Yes Historical Provider, MD   simvastatin (ZOCOR) 40 MG tablet Take 1 tablet (40 mg total) by mouth at bedtime. 03/19/13  Yes [name], MD  sulfamethoxazole-trimethoprim (BACTRIM DS) 800-160 MG per tablet Take 1 tablet by mouth 2 (two) times daily. 09/26/13  Yes Ellison Carwin, MD  triamcinolone cream (KENALOG) 0.1 % Apply 1 application topically 2 (two) times daily as needed. 07/25/13  Yes [name], MD  acetaminophen-codeine (TYLENOL #3) 300-30 MG per tablet Take 1-2 tablets by mouth every 4 (four) hours as needed. 09/26/13   Ellison Carwin, MD    Physical Exam: Filed Vitals:   10/08/13 0330 10/08/13 0345 10/08/13 0351 10/08/13 0443  BP: 113/70 99/66  92/71  Pulse: 128 127 125 130  Temp:   98.3 F (36.8 C) 98.7 F (37.1 C)  TempSrc:   Oral Oral  Resp: 22 25  24   Height:    5\' 8"  (1.727 m)  Weight:    80.1 kg (176 lb 9.4 oz)  SpO2: 97% 95%  96%     General:   well-developed and nourished.  Eyes: Anicteric no pallor.   ENT: No discharge from ears eyes nose mouth.   Neck: No mass felt.   Cardiovascular:  S1-S2 heard.   Respiratory:  no rhonchi or crepitations.   Abdomen:  soft nontender bowel sounds present. No guarding or rigidity.   Skin: No rash.   Musculoskeletal:  no edema. Right toe where patient had ingrown nail removal looks healed.   Psychiatric:  appears normal.   Neurologic:  alert awake oriented to time place and person. Moves all extremities 5 x 5. No facial asymmetry. Tongue is midline. PERRLA positive.   Labs on Admission:  Basic Metabolic Panel:  Recent Labs Lab 10/07/13 2313  NA 133*  K 5.4*  CL 99  CO2 18*  GLUCOSE 257*  BUN 46*  CREATININE 2.43*  CALCIUM 9.7   Liver Function Tests: No results found for this basename: AST, ALT, ALKPHOS, BILITOT, PROT, ALBUMIN,  in the last 168 hours No results found for this basename: LIPASE, AMYLASE,  in the last 168 hours No results found for this basename: AMMONIA,  in the last 168 hours CBC:  Recent Labs Lab  10/07/13 2313  WBC 5.1  NEUTROABS 3.8  HGB 13.9  HCT 40.1  MCV 89.7  PLT 131*   Cardiac Enzymes:  Recent Labs Lab 10/07/13 2313  TROPONINI <0.30    BNP (last 3 results) No results found for this basename: PROBNP,  in the last 8760 hours CBG: No results found for this basename: GLUCAP,  in the last 168 hours  Radiological Exams on Admission: Dg Chest Port 1 View  10/08/2013   CLINICAL DATA:  Pneumonia.  EXAM: PORTABLE CHEST - 1 VIEW  COMPARISON:  None.  FINDINGS: The cardiac silhouette, mediastinal and hilar contours are within normal limits. The lungs are clear. No pleural effusion the bony  thorax is intact. There are remote healed rib fractures bilaterally.  IMPRESSION: No acute cardiopulmonary findings.   Electronically Signed   By: Kalman Jewels M.D.   On: 10/08/2013 01:07    EKG: Independently reviewed.  sinus tachycardia.   Assessment/Plan Principal Problem:   Hypotension Active Problems:   DIABETES MELLITUS, TYPE II   AKI (acute kidney injury)   Metabolic acidosis  #1. Hypotension with weakness  - cause not clear. Blood cultures have been drawn. Repeat labs including metabolic panel acetone CK troponin cortisol CBC lactic acid. Continue with aggressive IV hydration closely follow intake output. Since patient is tachycardic and elevated d-dimer VQ scan has been ordered and sensation complaint of abdominal discomfort CT abdomen and pelvis with by mouth contrast has been ordered. Since patient has thrombocytopenia I have added doxycycline for now. Check RMSF and Lyme titers. #2. Metabolic acidosis - recheck metabolic panel after hydration. Check acetone levels and recheck lactic acid levels. Continue with hydration. There is further worsening of bicarbonate then may need to get ABG. #3. Acute renal failure - probably secondary to hypotension and possible dehydration. But since patient has thrombocytopenia check LDH for any hemolytic process. Check urine studies and CT  abdomen and pelvis has been ordered for abdominal discomfort at that time a check for any obstruction. Closely follow intake output and metabolic panel. #4. Thrombocytopenia - see #3. Since patient has renal failure check LDH and peripheral smear for any schistocytes to check for hemolytic process. Closely follow CBC. Patient is presently afebrile. Check RMSF and Lyme titers. Patient is empirically on doxycycline. #5. Diabetes mellitus - patient's anion gap was mildly elevated but I don't think patient is in DKA. Repeat metabolic panel has been ordered and if acetone is positive and anion gap was still elevated may need IV insulin infusion for DKA. #6. Hypertension - presently hypotensive and hold antihypertensives. #7. History of hyperlipidemia - hold statins for now. Check CK levels.   Code Status:  full code.   Family Communication:  patient's wife at the bedside.   Disposition Plan:  admit to inpatient.     Kathe Wirick N. [facility] Pager [phone].  If 7PM-7AM, please contact night-coverage [email or web address] Password [email or web address] 10/08/2013, 6:08 AM

## 2013-10-08 NOTE — Progress Notes (Signed)
[facility] TEAM 1 - Stepdown/ICU TEAM Progress Note  [name]. DGL:[id number] DOB: [birth year] DOA: 10/07/2013 PCP: Viviana Simpler, MD  Admit HPI / Brief Narrative: 73 y.o. male with history of diabetes mellitus, hypertension and hyperlipidemia was advised to come to the ER by his PCP after feeling weak and was found to be hypotensive. Patient was recently placed on antibiotics for right toe ingrown nail. His blood sugar was found to be around 257 and blood pressure was low in the 51O systolic.  In the ER patient's blood pressure was low 80s and tachycardic. Patient was given 3 L bolus > still tachycardic and blood pressure minimally improved to the 90s. Labs reveal elevated lactic acid levels with acute renal failure and metabolic acidosis. D-dimer was elevated.   HPI/Subjective: Pt seen for f/u visit.    Assessment/Plan:  SIRS (HR 130 + RR 27 + WBC <4) UA unremarkable - CXR unrevealing - S/Sx highly suspicious for RMSF - agree w/ empiric doxy for now  Hypotension BP improving w/ volume   Acute renal failure  Improving w/ hydration already   Metabolic acidosis / lactic acidosis  Due to above - follow w/ improvement in renal fxn  Thrombocytopenia   Elevated D-dimer  Uncontrolled DM  HLD  HTN  Code Status: FULL Family Communication: spoke w/ pt and family at bedside  Disposition Plan: SDU  Consultants: none  Procedures: none  Antibiotics: Doxy 8/19 >>  DVT prophylaxis: lovenox  Objective: Blood pressure 92/71, pulse 130, temperature 98.7 F (37.1 C), temperature source Oral, resp. rate 24, height 5\' 8"  (1.727 m), weight 80.1 kg (176 lb 9.4 oz), SpO2 96.00%.  Intake/Output Summary (Last 24 hours) at 10/08/13 0739 Last data filed at 10/08/13 0304  Gross per 24 hour  Intake      0 ml  Output    700 ml  Net   -700 ml   Exam: F/U exam completed  Data Reviewed: Basic Metabolic Panel:  Recent Labs Lab 10/07/13 2313 10/08/13 0630  NA 133* 137    K 5.4* 5.0  CL 99 107  CO2 18* 17*  GLUCOSE 257* 176*  BUN 46* 36*  CREATININE 2.43* 1.71*  CALCIUM 9.7 8.6    Liver Function Tests:  Recent Labs Lab 10/08/13 0630  AST 70*  ALT 89*  ALKPHOS 50  BILITOT 0.9  PROT 5.8*  ALBUMIN 3.3*    Recent Labs Lab 10/08/13 0630  LIPASE 75*   No results found for this basename: AMMONIA,  in the last 168 hours  Coags:  Recent Labs Lab 10/07/13 2313  INR 1.10   No results found for this basename: PTT,  in the last 168 hours  CBC:  Recent Labs Lab 10/07/13 2313 10/08/13 0630  WBC 5.1 3.2*  NEUTROABS 3.8 2.2  HGB 13.9 12.4*  HCT 40.1 36.4*  MCV 89.7 91.5  PLT 131* 122*   Cardiac Enzymes:  Recent Labs Lab 10/07/13 2313 10/08/13 0630  TROPONINI <0.30 <0.30   CBG: No results found for this basename: GLUCAP,  in the last 168 hours  Recent Results (from the past 240 hour(s))  MRSA PCR SCREENING     Status: None   Collection Time    10/08/13  4:48 AM      Result Value Ref Range Status   MRSA by PCR NEGATIVE  NEGATIVE Final   Comment:            The GeneXpert MRSA Assay (FDA     approved  for NASAL specimens     only), is one component of a     comprehensive MRSA colonization     surveillance program. It is not     intended to diagnose MRSA     infection nor to guide or     monitor treatment for     MRSA infections.     Studies:  Recent x-ray studies have been reviewed in detail by the Attending Physician  Scheduled Meds:  Scheduled Meds: . doxycycline (VIBRAMYCIN) IV  100 mg Intravenous Q12H  . enoxaparin (LOVENOX) injection  30 mg Subcutaneous Q24H  . insulin aspart  0-9 Units Subcutaneous TID WC  . iohexol  25 mL Oral Q1 Hr x 2    Time spent on care of this patient: 25+ mins   Jesica Goheen T , MD   [facility] Office  [phone] Pager - Text Page per Shea Evans as per below:  On-Call/Text Page:      [email or web address]      password [email or web address]  If 7PM-7AM, please contact  night-coverage [email or web address] Password [email or web address] 10/08/2013, 7:39 AM   LOS: 1 day

## 2013-10-08 NOTE — ED Provider Notes (Signed)
CSN: [id number]     Arrival date & time 10/07/13  2251 History   First MD Initiated Contact with Patient 10/07/13 2300     Chief Complaint  Patient presents with  . Near Syncope     (Consider location/radiation/quality/duration/timing/severity/associated sxs/prior Treatment) HPI Comments: 73 y.o. male with history of diabetes mellitus, hypertension and hyperlipidemia was advised to come to the ER by patient's PCP office after patient was feeling weak and was found to be hypotensive at his house. PT woke up feeling unwell. He continued with the day, and went to the evening ball game. At the ball game patient got dizzy, and nauseated and diophoretic. He refusd medical care, but did have them drive him to his residence. At home, pt continued to feel weak, his BP was checked by his wife, and was low.  Patient states that other than weakness patient has not been having any chest pain shortness of breath. Has had one episode of diarrhea 2 days ago and has been having some lower abdominal discomfort since then but otherwise denies any dysuria or discharges. Denies any fever chills. PT started on antibiotics for a toe infection.  The history is provided by the patient and medical records.    Past Medical History  Diagnosis Date  . Diabetes mellitus   . Hyperlipidemia   . Psoriasis   . Hypertension   . ED (erectile dysfunction)   . History of nephrolithiasis   . Elevated PSA   . Frequency of urination    Past Surgical History  Procedure Laterality Date  . Tonsillectomy    . Nm myoview ltd      normal EF 56% 03/08  . Prostate biopsy  01/05/2012    Procedure: BIOPSY TRANSRECTAL ULTRASONIC PROSTATE (TUBP);  Surgeon: [name], MD;  Location: [facility];  Service: Urology;  Laterality: N/A;  . Cataract extraction w/ intraocular lens implant Right 03/30/12    Dr Kathrin Penner   Family History  Problem Relation Age of Onset  . Cancer Mother     breast cancer  . Diabetes Mother    . Heart disease Neg Hx    History  Substance Use Topics  . Smoking status: Former Smoker -- 1.00 packs/day for 5 years    Types: Cigarettes  . Smokeless tobacco: Current User    Types: Chew     Comment: QUIT SMOKING CIGARETTES 50 YRS AGO--  CHEWED TOBACCO FOR 40 YRS  . Alcohol Use: No    Review of Systems  Constitutional: Positive for activity change and fatigue. Negative for fever and chills.  Eyes: Negative for visual disturbance.  Respiratory: Negative for cough, chest tightness and shortness of breath.   Cardiovascular: Positive for palpitations. Negative for chest pain and leg swelling.  Gastrointestinal: Negative for abdominal distention.  Genitourinary: Negative for dysuria, enuresis and difficulty urinating.  Musculoskeletal: Negative for arthralgias and neck pain.  Skin: Negative for rash.  Neurological: Positive for dizziness, weakness and light-headedness. Negative for headaches.  Psychiatric/Behavioral: Negative for confusion.      Allergies  Review of patient's allergies indicates no known allergies.  Home Medications   Prior to Admission medications   Medication Sig Start Date End Date Taking? Authorizing Provider  glipiZIDE (GLIPIZIDE XL) 5 MG 24 hr tablet Take 1 tablet (5 mg total) by mouth daily. 03/19/13  Yes [name], MD  ketoconazole (NIZORAL) 2 % shampoo Apply 1 application topically 2 (two) times a week. 07/25/13  Yes [name], MD  lisinopril (  PRINIVIL,ZESTRIL) 10 MG tablet Take 1 tablet (10 mg total) by mouth daily. 03/19/13  Yes [name], MD  metFORMIN (GLUCOPHAGE) 1000 MG tablet Take 1 tablet (1,000 mg total) by mouth 2 (two) times daily with a meal. 03/19/13  Yes [name], MD  mometasone (ELOCON) 0.1 % cream Apply 1 application topically 2 (two) times daily as needed. 03/19/13  Yes [name], MD  sildenafil (VIAGRA) 100 MG tablet Take 50-100 mg by mouth daily as needed.    Yes Historical Provider, MD  simvastatin  (ZOCOR) 40 MG tablet Take 1 tablet (40 mg total) by mouth at bedtime. 03/19/13  Yes [name], MD  triamcinolone cream (KENALOG) 0.1 % Apply 1 application topically 2 (two) times daily as needed. 07/25/13  Yes [name], MD  acetaminophen-codeine (TYLENOL #3) 300-30 MG per tablet Take 1-2 tablets by mouth every 4 (four) hours as needed. 09/26/13   Roselee Culver, MD  doxycycline (VIBRAMYCIN) 50 MG capsule Take 2 capsules (100 mg total) by mouth 2 (two) times daily. 10/10/13   Cherene Altes, MD   BP 128/68  Pulse 71  Temp(Src) 98.5 F (36.9 C) (Oral)  Resp 24  Ht 5\' 8"  (1.727 m)  Wt 176 lb 12.8 oz (80.196 kg)  BMI 26.89 kg/m2  SpO2 100% Physical Exam  Nursing note and vitals reviewed. Constitutional: He is oriented to person, place, and time. He appears well-developed.  HENT:  Head: Normocephalic and atraumatic.  Eyes: Conjunctivae and EOM are normal. Pupils are equal, round, and reactive to light.  Neck: Normal range of motion. Neck supple. No JVD present.  Cardiovascular: Regular rhythm.   No murmur heard. Pulmonary/Chest: Effort normal and breath sounds normal. No respiratory distress. He has no wheezes.  Abdominal: Soft. Bowel sounds are normal. He exhibits no distension. There is no tenderness. There is no rebound and no guarding.  Musculoskeletal: He exhibits no edema and no tenderness.  Neurological: He is alert and oriented to person, place, and time. No cranial nerve deficit. Coordination normal.  Skin: Skin is warm.    ED Course  Procedures (including critical care time) Labs Review Labs Reviewed  CBC WITH DIFFERENTIAL - Abnormal; Notable for the following:    Platelets 131 (*)    Lymphs Abs 0.6 (*)    All other components within normal limits  BASIC METABOLIC PANEL - Abnormal; Notable for the following:    Sodium 133 (*)    Potassium 5.4 (*)    CO2 18 (*)    Glucose, Bld 257 (*)    BUN 46 (*)    Creatinine, Ser 2.43 (*)    GFR calc non Af Amer 25  (*)    GFR calc Af Amer 29 (*)    Anion gap 16 (*)    All other components within normal limits  URINALYSIS, ROUTINE W REFLEX MICROSCOPIC - Abnormal; Notable for the following:    Glucose, UA >1000 (*)    All other components within normal limits  D-DIMER, QUANTITATIVE - Abnormal; Notable for the following:    D-Dimer, Quant 1.62 (*)    All other components within normal limits  URINE MICROSCOPIC-ADD ON - Abnormal; Notable for the following:    Casts HYALINE CASTS (*)    All other components within normal limits  HEMOGLOBIN A1C - Abnormal; Notable for the following:    Hemoglobin A1C 7.6 (*)    Mean Plasma Glucose 171 (*)    All other components within normal  limits  BASIC METABOLIC PANEL - Abnormal; Notable for the following:    CO2 17 (*)    Glucose, Bld 176 (*)    BUN 36 (*)    Creatinine, Ser 1.71 (*)    GFR calc non Af Amer 38 (*)    GFR calc Af Amer 44 (*)    All other components within normal limits  CBC WITH DIFFERENTIAL - Abnormal; Notable for the following:    WBC 3.2 (*)    RBC 3.98 (*)    Hemoglobin 12.4 (*)    HCT 36.4 (*)    Platelets 122 (*)    Lymphs Abs 0.6 (*)    All other components within normal limits  HEPATIC FUNCTION PANEL - Abnormal; Notable for the following:    Total Protein 5.8 (*)    Albumin 3.3 (*)    AST 70 (*)    ALT 89 (*)    All other components within normal limits  LIPASE, BLOOD - Abnormal; Notable for the following:    Lipase 75 (*)    All other components within normal limits  T4, FREE - Abnormal; Notable for the following:    Free T4 0.78 (*)    All other components within normal limits  LACTATE DEHYDROGENASE - Abnormal; Notable for the following:    LDH 339 (*)    All other components within normal limits  GLUCOSE, CAPILLARY - Abnormal; Notable for the following:    Glucose-Capillary 168 (*)    All other components within normal limits  GLUCOSE, CAPILLARY - Abnormal; Notable for the following:    Glucose-Capillary 144 (*)     All other components within normal limits  CBC - Abnormal; Notable for the following:    WBC 2.9 (*)    RBC 3.75 (*)    Hemoglobin 11.7 (*)    HCT 33.4 (*)    Platelets 125 (*)    All other components within normal limits  COMPREHENSIVE METABOLIC PANEL - Abnormal; Notable for the following:    Sodium 135 (*)    Glucose, Bld 159 (*)    BUN 29 (*)    Creatinine, Ser 1.51 (*)    Total Protein 5.3 (*)    Albumin 3.1 (*)    AST 47 (*)    ALT 76 (*)    GFR calc non Af Amer 44 (*)    GFR calc Af Amer 51 (*)    All other components within normal limits  GLUCOSE, CAPILLARY - Abnormal; Notable for the following:    Glucose-Capillary 154 (*)    All other components within normal limits  GLUCOSE, CAPILLARY - Abnormal; Notable for the following:    Glucose-Capillary 309 (*)    All other components within normal limits  GLUCOSE, CAPILLARY - Abnormal; Notable for the following:    Glucose-Capillary 154 (*)    All other components within normal limits  GLUCOSE, CAPILLARY - Abnormal; Notable for the following:    Glucose-Capillary 330 (*)    All other components within normal limits  GLUCOSE, CAPILLARY - Abnormal; Notable for the following:    Glucose-Capillary 321 (*)    All other components within normal limits  LIPID PANEL - Abnormal; Notable for the following:    Triglycerides 192 (*)    HDL 38 (*)    All other components within normal limits  GLUCOSE, CAPILLARY - Abnormal; Notable for the following:    Glucose-Capillary 223 (*)    All other components within normal limits  BASIC METABOLIC  PANEL - Abnormal; Notable for the following:    Glucose, Bld 222 (*)    BUN 25 (*)    GFR calc non Af Amer 59 (*)    GFR calc Af Amer 68 (*)    All other components within normal limits  CBC - Abnormal; Notable for the following:    WBC 3.3 (*)    RBC 3.78 (*)    Hemoglobin 11.9 (*)    HCT 34.3 (*)    Platelets 135 (*)    All other components within normal limits  GLUCOSE, CAPILLARY -  Abnormal; Notable for the following:    Glucose-Capillary 145 (*)    All other components within normal limits  GLUCOSE, CAPILLARY - Abnormal; Notable for the following:    Glucose-Capillary 237 (*)    All other components within normal limits  I-STAT CG4 LACTIC ACID, ED - Abnormal; Notable for the following:    Lactic Acid, Venous 2.75 (*)    All other components within normal limits  URINE CULTURE  CULTURE, BLOOD (ROUTINE X 2)  CULTURE, BLOOD (ROUTINE X 2)  MRSA PCR SCREENING  APTT  TROPONIN I  PROTIME-INR  TSH  SODIUM, URINE, RANDOM  CREATININE, URINE, RANDOM  KETONES, QUALITATIVE  LACTIC ACID, PLASMA  T3, FREE  TROPONIN I  TSH  CORTISOL  SEDIMENTATION RATE  PATHOLOGIST SMEAR REVIEW  HIV ANTIBODY (ROUTINE TESTING)  ROCKY MTN SPOTTED FVR AB, IGM-BLOOD    Imaging Review No results found.   EKG Interpretation   Date/Time:  Tuesday October 07 2013 23:00:00 EDT Ventricular Rate:  136 PR Interval:  336 QRS Duration: 73 QT Interval:  289 QTC Calculation: 435 R Axis:   0 Text Interpretation:  Sinus tachycardia Prolonged PR interval Consider  right atrial enlargement Confirmed by Kathrynn Humble, MD, Thelma Comp [phone]) on  10/08/2013 12:06:07 AM      MDM   Final diagnoses:  Near syncope  Dizziness  Orthostatic hypotension  AKI (acute kidney injury)  Hypotensive shock  DDx: Sepsis syndrome ACS syndrome DKA Stroke Infection - pneumonia/UTI/Cellulitis PE Dehydration Electrolyte abnormality Tox syndrome Orthostatic hypotension Vertebral artery dissection/stenosis Dysrhythmia Vasovagal/neurocardiogenic syncope Aortic stenosis Valvular disorder/Cardiomyopathy Anemia  Pt comes in with near syncope and weakness. He has no cardiac prodrome. Pt has associated nausea and diophoresis. Neuro exam is non focal. PT is found to be orthostatic, and he is tachycardic. There is no hx of PE and there are no risk factors for the same. Pt has no chest pain, his dizziness is not  reproducible, and he ias intact and equal distal pulses x 4. PT has no risk factors for AAA, dissections. Pt given 2 L of ivf. Lactate cleared. BP stabilized, but pt remained tachycardic. He has AKI as well. Dimer was ordered - and is elevated, will need a vq scan.  Suspect dizziness from orthostatics or from shock leading to poor brain perfusion. AKI is likely from the poor perfusion as well. Will admit for optimization.  CRITICAL CARE Performed by: Varney Biles   Total critical care time: 40 minutes - hypotensive shock leading to dizziness, AKI  Critical care time was exclusive of separately billable procedures and treating other patients.  Critical care was necessary to treat or prevent imminent or life-threatening deterioration.  Critical care was time spent personally by me on the following activities: development of treatment plan with patient and/or surrogate as well as nursing, discussions with consultants, evaluation of patient's response to treatment, examination of patient, obtaining history from patient or surrogate, ordering and  performing treatments and interventions, ordering and review of laboratory studies, ordering and review of radiographic studies, pulse oximetry and re-evaluation of patient's condition.     Varney Biles, MD 10/16/13 1340

## 2013-10-08 NOTE — Progress Notes (Signed)
Utilization Review Completed.Donne Anon T8/19/2015

## 2013-10-09 ENCOUNTER — Encounter (HOSPITAL_COMMUNITY): Payer: Self-pay | Admitting: *Deleted

## 2013-10-09 ENCOUNTER — Inpatient Hospital Stay (HOSPITAL_COMMUNITY): Payer: Medicare HMO

## 2013-10-09 ENCOUNTER — Telehealth: Payer: Self-pay | Admitting: Internal Medicine

## 2013-10-09 DIAGNOSIS — R791 Abnormal coagulation profile: Secondary | ICD-10-CM

## 2013-10-09 DIAGNOSIS — D696 Thrombocytopenia, unspecified: Secondary | ICD-10-CM

## 2013-10-09 DIAGNOSIS — E785 Hyperlipidemia, unspecified: Secondary | ICD-10-CM

## 2013-10-09 DIAGNOSIS — R6510 Systemic inflammatory response syndrome (SIRS) of non-infectious origin without acute organ dysfunction: Secondary | ICD-10-CM

## 2013-10-09 DIAGNOSIS — I10 Essential (primary) hypertension: Secondary | ICD-10-CM

## 2013-10-09 DIAGNOSIS — R911 Solitary pulmonary nodule: Secondary | ICD-10-CM

## 2013-10-09 DIAGNOSIS — E1165 Type 2 diabetes mellitus with hyperglycemia: Secondary | ICD-10-CM

## 2013-10-09 DIAGNOSIS — R55 Syncope and collapse: Secondary | ICD-10-CM

## 2013-10-09 DIAGNOSIS — E118 Type 2 diabetes mellitus with unspecified complications: Secondary | ICD-10-CM

## 2013-10-09 DIAGNOSIS — IMO0002 Reserved for concepts with insufficient information to code with codable children: Secondary | ICD-10-CM

## 2013-10-09 LAB — COMPREHENSIVE METABOLIC PANEL
ALT: 76 U/L — AB (ref 0–53)
AST: 47 U/L — AB (ref 0–37)
Albumin: 3.1 g/dL — ABNORMAL LOW (ref 3.5–5.2)
Alkaline Phosphatase: 47 U/L (ref 39–117)
Anion gap: 10 (ref 5–15)
BUN: 29 mg/dL — ABNORMAL HIGH (ref 6–23)
CO2: 19 meq/L (ref 19–32)
CREATININE: 1.51 mg/dL — AB (ref 0.50–1.35)
Calcium: 8.4 mg/dL (ref 8.4–10.5)
Chloride: 106 mEq/L (ref 96–112)
GFR calc Af Amer: 51 mL/min — ABNORMAL LOW (ref >90)
GFR, EST NON AFRICAN AMERICAN: 44 mL/min — AB (ref >90)
Glucose, Bld: 159 mg/dL — ABNORMAL HIGH (ref 70–99)
Potassium: 5.1 mEq/L (ref 3.7–5.3)
Sodium: 135 mEq/L — ABNORMAL LOW (ref 137–147)
Total Bilirubin: 1 mg/dL (ref 0.3–1.2)
Total Protein: 5.3 g/dL — ABNORMAL LOW (ref 6.0–8.3)

## 2013-10-09 LAB — CBC
HCT: 33.4 % — ABNORMAL LOW (ref 39.0–52.0)
Hemoglobin: 11.7 g/dL — ABNORMAL LOW (ref 13.0–17.0)
MCH: 31.2 pg (ref 26.0–34.0)
MCHC: 35 g/dL (ref 30.0–36.0)
MCV: 89.1 fL (ref 78.0–100.0)
Platelets: 125 10*3/uL — ABNORMAL LOW (ref 150–400)
RBC: 3.75 MIL/uL — AB (ref 4.22–5.81)
RDW: 13.8 % (ref 11.5–15.5)
WBC: 2.9 10*3/uL — AB (ref 4.0–10.5)

## 2013-10-09 LAB — URINE CULTURE

## 2013-10-09 LAB — GLUCOSE, CAPILLARY
GLUCOSE-CAPILLARY: 330 mg/dL — AB (ref 70–99)
GLUCOSE-CAPILLARY: 321 mg/dL — AB (ref 70–99)
Glucose-Capillary: 154 mg/dL — ABNORMAL HIGH (ref 70–99)
Glucose-Capillary: 223 mg/dL — ABNORMAL HIGH (ref 70–99)

## 2013-10-09 LAB — ROCKY MTN SPOTTED FVR AB, IGM-BLOOD: RMSF IGM: 0.05 IV (ref 0.00–0.89)

## 2013-10-09 MED ORDER — METOPROLOL TARTRATE 12.5 MG HALF TABLET
12.5000 mg | ORAL_TABLET | Freq: Two times a day (BID) | ORAL | Status: DC
  Administered 2013-10-10 (×2): 12.5 mg via ORAL
  Filled 2013-10-09 (×3): qty 1

## 2013-10-09 MED ORDER — ENOXAPARIN SODIUM 40 MG/0.4ML ~~LOC~~ SOLN
40.0000 mg | SUBCUTANEOUS | Status: DC
  Administered 2013-10-10: 40 mg via SUBCUTANEOUS
  Filled 2013-10-09 (×2): qty 0.4

## 2013-10-09 MED ORDER — INSULIN GLARGINE 100 UNIT/ML ~~LOC~~ SOLN
8.0000 [IU] | Freq: Every day | SUBCUTANEOUS | Status: DC
  Administered 2013-10-09 – 2013-10-10 (×2): 8 [IU] via SUBCUTANEOUS
  Filled 2013-10-09 (×2): qty 0.08

## 2013-10-09 MED ORDER — GLIPIZIDE ER 5 MG PO TB24
5.0000 mg | ORAL_TABLET | Freq: Every day | ORAL | Status: DC
  Administered 2013-10-10: 5 mg via ORAL
  Filled 2013-10-09 (×2): qty 1

## 2013-10-09 NOTE — Telephone Encounter (Signed)
Reviewed his records Really think RMSF She was hoping he could go home today (him too) Wondered about me seeing him and rechecking labs and still being able to leave tonight  I told her that given that he was in shock on arrival, and the hospitalists are pushed for early discharge, that they are not comfortable yet with him leaving. Best to wait till the morning. Will plan to see him in hospital follow up next week

## 2013-10-09 NOTE — Telephone Encounter (Signed)
Pt's wife, Peter Congo, is calling regarding Mr. Cada being in the hospital. She would like for you to call her at your earliest convinence so that she may discuss some issues with you. Thank you!

## 2013-10-09 NOTE — Progress Notes (Signed)
Inpatient Diabetes Program Recommendations  AACE/ADA: New Consensus Statement on Inpatient Glycemic Control (2013)  Target Ranges:  Prepandial:   less than 140 mg/dL      Peak postprandial:   less than 180 mg/dL (1-2 hours)      Critically ill patients:  140 - 180 mg/dL   Results for JORI, THRALL (MRN [id number]) as of 10/09/2013 09:34  Ref. Range 10/08/2013 08:19 10/08/2013 11:54 10/08/2013 17:57 10/08/2013 20:54 10/09/2013 07:44  Glucose-Capillary Latest Range: 70-99 mg/dL 168 (H) 144 (H) 154 (H) 309 (H) 154 (H)   Diabetes history: DM2 Outpatient Diabetes medications: Glipizide 5 mg daily, Metformin 1000 mg BID Current orders for Inpatient glycemic control: Novolog 0-9 units AC  Inpatient Diabetes Program Recommendations Diet: Please consider changing diet from Regular to [place].  Thanks, Barnie Alderman, RN, MSN, CCRN Diabetes Coordinator Inpatient Diabetes Program [phone] (Team Pager) [phone] (AP office) [phone] [facility] office)

## 2013-10-09 NOTE — Progress Notes (Signed)
[name] TEAM 1 - Stepdown/ICU TEAM Progress Note  Tony Jackson. HFW:[id number] DOB: [birth year] DOA: 10/07/2013 PCP: Viviana Simpler, MD  Admit HPI / Brief Narrative: Tony Jackson. is a 73 y.o.WM PMHx diabetes mellitus, hypertension and hyperlipidemia was advised to come to the ER by patient's PCP office after patient was feeling weak and was found to be hypotensive at his house. Patient states that he has had recently been to [place] last week with his family. Since then patient has been feeling weak and tired. Patient also was recently placed on antibiotics for right toe ingrown nail. Since yesterday morning patient's weakness became more profound. Later in the evening when he was going to play ball game he felt weak and went back home. At his house his blood sugar was found to be around 257 and blood pressure was low in the 02D systolic. His PCPs office advised to go to the ER. In the ER patient's blood pressure was low systolic 74J and tachycardic. Patient was totally given 3 L normal 7 bolus still patient is tachycardic and blood pressure has minimally improved to the 28N systolic. Patient's labs reveal elevated lactic acid levels with acute renal failure and metabolic acidosis. Since patient was tachycardic d-dimer was done which is elevated. Patient states that other than weakness patient has not been having any chest pain shortness of breath. Has had one episode of diarrhea 2 days ago and has been having some lower abdominal discomfort since then but otherwise denies any dysuria or discharges. Denies any fever chills sweating. Patient other than being on Bactrim has not been on any new medications. States that he has had mosquito bites but does not recall having any other insect bite. Patient on exam is nonfocal but appears weak.    HPI/Subjective: 8/20 A./O. x4 state spent a great deal of time outdoors working, has been bitten by multiple insects to include tics over the past month.  Does not recall if any of the bites caused a rash.  Assessment/Plan: SIRS (HR 130 + RR 27 + WBC <4)  -UA unremarkable - CXR unrevealing  - S/Sx highly suspicious for RMSF, although IgM 4 [place] spotted fever negative, patient at high risk secondary to multiple insect/tick bites would complete course of doxycycline  Hypotension  -Resolved    Acute renal failure  -Improving but not back to baseline.  -Patient's baseline Cr~1.3  -Continue to hydrate and if creatinine continues to improve overnight discharge in a.m.   Metabolic acidosis / lactic acidosis  -Improved    Thrombocytopenia  -Resolving   Elevated D-dimer -Negative PE see VQ scan below  Uncontrolled DM  -8/19 hemoglobin A1c= 7.6 -Given patient's renal function on discharge would not restart metformin -Calculated creatinine clearance= 50 mg/dL -Restart glipizide XL 5 mg (WOULD NOT go any higher) -Start Lantus 8 mg daily by: Would discharge on Lantus  HLD -Obtain lipid panel  HTN -Secondary to patient's renal failure would not restart ACE/ARB. - Start metoprolol 12.5 mg BID  Left lower lobe nodule -Obtain  Chest CT without contrast to visualize entire lung field   Code Status: FULL Family Communication: no family present at time of exam Disposition Plan: Discharge in a.m. if renal function continues to improve   Consultants: NA  Procedure/Significant Events: 8/19 CT abdomen pelvis without contrast;No hydronephrosis or renal obstruction is noted.  -Moderate prostatic hypertrophy;- Moderate splenomegaly   -Grade 2 anterolisthesis of L5-S1 secondary to bilateral pars defects at this level.  -5  mm left lower lobe nodule  8/19 VQ scan;No significant perfusion defects seen     Culture [facility] spotted fever negative  Antibiotics: Doxycycline 8/19>>  DVT prophylaxis: Lovenox   Devices NA   LINES / TUBES:      Continuous Infusions:   Objective: VITAL SIGNS: Temp: 98 F (36.7 C)  (08/20 1300) Temp src: Oral (08/20 1300) BP: 143/79 mmHg (08/20 1300) Pulse Rate: 73 (08/20 1300) SPO2; 100% on room air FIO2:   Intake/Output Summary (Last 24 hours) at 10/09/13 1617 Last data filed at 10/09/13 1400  Gross per 24 hour  Intake   1990 ml  Output   2100 ml  Net   -110 ml     Exam: General: A./O. x4, NAD, No acute respiratory distress Lungs: Clear to auscultation bilaterally without wheezes or crackles Cardiovascular: Regular rate and rhythm without murmur gallop or rub normal S1 and S2 Abdomen: Nontender, nondistended, soft, bowel sounds positive, no rebound, no ascites, no appreciable mass Extremities: No significant cyanosis, clubbing, or edema bilateral lower extremities, multiple skin lesions that patient states are psoriasis Skin; multiple lesions on patient's skin which could be either or insect bites or psoriatic plaques. Patient's skin very sunburned therefore difficult to differentiate  Data Reviewed: Basic Metabolic Panel:  Recent Labs Lab 10/07/13 2313 10/08/13 0630 10/09/13 0345  NA 133* 137 135*  K 5.4* 5.0 5.1  CL 99 107 106  CO2 18* 17* 19  GLUCOSE 257* 176* 159*  BUN 46* 36* 29*  CREATININE 2.43* 1.71* 1.51*  CALCIUM 9.7 8.6 8.4   Liver Function Tests:  Recent Labs Lab 10/08/13 0630 10/09/13 0345  AST 70* 47*  ALT 89* 76*  ALKPHOS 50 47  BILITOT 0.9 1.0  PROT 5.8* 5.3*  ALBUMIN 3.3* 3.1*    Recent Labs Lab 10/08/13 0630  LIPASE 75*   No results found for this basename: AMMONIA,  in the last 168 hours CBC:  Recent Labs Lab 10/07/13 2313 10/08/13 0630 10/09/13 0345  WBC 5.1 3.2* 2.9*  NEUTROABS 3.8 2.2  --   HGB 13.9 12.4* 11.7*  HCT 40.1 36.4* 33.4*  MCV 89.7 91.5 89.1  PLT 131* 122* 125*   Cardiac Enzymes:  Recent Labs Lab 10/07/13 2313 10/08/13 0630  TROPONINI <0.30 <0.30   BNP (last 3 results) No results found for this basename: PROBNP,  in the last 8760 hours CBG:  Recent Labs Lab 10/08/13 1154  10/08/13 1757 10/08/13 2054 10/09/13 0744 10/09/13 1135  GLUCAP 144* 154* 309* 154* 330*    Recent Results (from the past 240 hour(s))  URINE CULTURE     Status: None   Collection Time    10/08/13  1:14 AM      Result Value Ref Range Status   Specimen Description URINE, CLEAN CATCH   Final   Special Requests NONE   Final   Culture  Setup Time     Final   Value: 10/08/2013 03:11     Performed at [facility] Count     Final   Value: 3,000 COLONIES/ML     Performed at [facility]   Culture     Final   Value: INSIGNIFICANT GROWTH     Performed at [facility]   Report Status 10/09/2013 FINAL   Final  CULTURE, BLOOD (ROUTINE X 2)     Status: None   Collection Time    10/08/13  1:47 AM      Result Value Ref  Range Status   Specimen Description BLOOD RIGHT HAND   Final   Special Requests     Final   Value: BOTTLES DRAWN AEROBIC AND ANAEROBIC 6CC AEROBIC 5CC ANAEROBIC   Culture  Setup Time     Final   Value: 10/08/2013 08:52     Performed at [facility]   Culture     Final   Value:        BLOOD CULTURE RECEIVED NO GROWTH TO DATE CULTURE WILL BE HELD FOR 5 DAYS BEFORE ISSUING A FINAL NEGATIVE REPORT     Performed at [facility]   Report Status PENDING   Incomplete  CULTURE, BLOOD (ROUTINE X 2)     Status: None   Collection Time    10/08/13  1:48 AM      Result Value Ref Range Status   Specimen Description BLOOD LEFT HAND   Final   Special Requests BOTTLES DRAWN AEROBIC ONLY 10CC   Final   Culture  Setup Time     Final   Value: 10/08/2013 08:51     Performed at [facility]   Culture     Final   Value:        BLOOD CULTURE RECEIVED NO GROWTH TO DATE CULTURE WILL BE HELD FOR 5 DAYS BEFORE ISSUING A FINAL NEGATIVE REPORT     Performed at [facility]   Report Status PENDING   Incomplete  MRSA PCR SCREENING     Status: None   Collection Time    10/08/13  4:48 AM      Result Value Ref Range Status   MRSA by  PCR NEGATIVE  NEGATIVE Final   Comment:            The GeneXpert MRSA Assay (FDA     approved for NASAL specimens     only), is one component of a     comprehensive MRSA colonization     surveillance program. It is not     intended to diagnose MRSA     infection nor to guide or     monitor treatment for     MRSA infections.     Studies:  Recent x-ray studies have been reviewed in detail by the Attending Physician  Scheduled Meds:  Scheduled Meds: . doxycycline (VIBRAMYCIN) IV  100 mg Intravenous Q12H  . [START ON 10/10/2013] enoxaparin (LOVENOX) injection  40 mg Subcutaneous Q24H  . insulin aspart  0-9 Units Subcutaneous TID WC    Time spent on care of this patient: 40 mins   Allie Bossier , MD   [facility] Office  [phone] Pager [phone]  On-Call/Text Page:      [email or web address]      password [email or web address]  If 7PM-7AM, please contact night-coverage [email or web address] Password [email or web address] 10/09/2013, 4:17 PM   LOS: 2 days

## 2013-10-10 DIAGNOSIS — A770 Spotted fever due to Rickettsia rickettsii: Secondary | ICD-10-CM | POA: Diagnosis present

## 2013-10-10 DIAGNOSIS — A419 Sepsis, unspecified organism: Secondary | ICD-10-CM | POA: Diagnosis present

## 2013-10-10 LAB — CBC
HEMATOCRIT: 34.3 % — AB (ref 39.0–52.0)
Hemoglobin: 11.9 g/dL — ABNORMAL LOW (ref 13.0–17.0)
MCH: 31.5 pg (ref 26.0–34.0)
MCHC: 34.7 g/dL (ref 30.0–36.0)
MCV: 90.7 fL (ref 78.0–100.0)
Platelets: 135 10*3/uL — ABNORMAL LOW (ref 150–400)
RBC: 3.78 MIL/uL — AB (ref 4.22–5.81)
RDW: 13.7 % (ref 11.5–15.5)
WBC: 3.3 10*3/uL — ABNORMAL LOW (ref 4.0–10.5)

## 2013-10-10 LAB — BASIC METABOLIC PANEL
Anion gap: 10 (ref 5–15)
BUN: 25 mg/dL — AB (ref 6–23)
CALCIUM: 9.3 mg/dL (ref 8.4–10.5)
CO2: 21 meq/L (ref 19–32)
Chloride: 106 mEq/L (ref 96–112)
Creatinine, Ser: 1.2 mg/dL (ref 0.50–1.35)
GFR calc Af Amer: 68 mL/min — ABNORMAL LOW (ref >90)
GFR calc non Af Amer: 59 mL/min — ABNORMAL LOW (ref >90)
GLUCOSE: 222 mg/dL — AB (ref 70–99)
POTASSIUM: 4.9 meq/L (ref 3.7–5.3)
SODIUM: 137 meq/L (ref 137–147)

## 2013-10-10 LAB — LIPID PANEL
CHOL/HDL RATIO: 3.7 ratio
CHOLESTEROL: 141 mg/dL (ref 0–200)
HDL: 38 mg/dL — ABNORMAL LOW (ref >39)
LDL Cholesterol: 65 mg/dL (ref 0–99)
TRIGLYCERIDES: 192 mg/dL — AB (ref <150)
VLDL: 38 mg/dL (ref 0–40)

## 2013-10-10 LAB — GLUCOSE, CAPILLARY
Glucose-Capillary: 145 mg/dL — ABNORMAL HIGH (ref 70–99)
Glucose-Capillary: 237 mg/dL — ABNORMAL HIGH (ref 70–99)

## 2013-10-10 MED ORDER — INSULIN STARTER KIT- PEN NEEDLES (ENGLISH)
1.0000 | Freq: Once | Status: DC
  Filled 2013-10-10: qty 1

## 2013-10-10 MED ORDER — DOXYCYCLINE HYCLATE 50 MG PO CAPS: 100.0000 mg | ORAL_CAPSULE | Freq: Two times a day (BID) | ORAL | Status: DC

## 2013-10-10 NOTE — Discharge Instructions (Signed)
[place] Spotted Fever °[place] Spotted Fever (RMSF) is the oldest known tick-borne disease of people in the [place]. This disease was named because it was first described among people in the [place] area who had an illness characterized by a rash with red-purple-black spots. This disease is caused by a rickettsia (Rickettsia rickettsii), a bacteria carried by the tick. °The [place] wood tick and the American dog tick acquire and transmit the RMSF bacteria (pictures NOT actual size). When a larval, nymphal, or adult tick feeds on an infected rodent or larger animal, the tick can become infected. Infected adult ticks then feed on people who may then get RMSF. The tick transmits the disease to humans during a prolonged period of feeding that lasts many hours, days, or even a couple weeks. The bite is painless and frequently goes unnoticed. An infected male tick may also pass the rickettsial bacteria to her eggs that then may mature to be infected adult ticks. °The rickettsia that causes RMSF can also get into a person's body through damaged skin. A tick bite is not necessary. People can get RMSF if they crush a tick and get its blood or body fluids on their skin through a small cut or sore.  °DIAGNOSIS °Diagnosis is made by laboratory tests.  °TREATMENT °Treatment is with antibiotics (medications that kill rickettsia and other bacteria). Immediate treatment usually prevents death. °GEOGRAPHIC RANGE °This disease was reported only in the [place] until 1931. RMSF has more recently been described among individuals in all states except [place], [place], and [place]. The highest reported incidences of RMSF now occur among residents of [place], [place], [place], and the [place]. °TIME OF YEAR  °Most cases are diagnosed during late spring and summer when ticks are most active. However, especially in the warmer southern states, a few cases occur during the winter. °SYMPTOMS   °· Symptoms of RMSF begin from 2 to 14 days after a tick bite. The most common early symptoms are fever, muscle aches, and headache followed by nausea (feeling sick to your stomach) or vomiting. °· The RMSF rash is typically delayed until 3 or more days after symptom onset, and eventually develops in 9 of 10 infected patients by the fifth day of illness. °If the disease is not treated it can cause death. If you get a fever, headache, muscle aches, rash, nausea, or vomiting within 2 weeks of a possible tick bite or exposure, you should see your caregiver immediately. °PREVENTION °Ticks prefer to hide in shady, moist ground litter. They can often be found above the ground clinging to tall grass, brush, shrubs and low tree branches. They also inhabit lawns and gardens, especially at the edges of woodlands and around old stone walls. Within the areas where ticks generally live, no naturally vegetated area can be considered completely free of infected ticks. The best precaution against RMSF is to avoid contact with soil, leaf litter, and vegetation as much as possible in tick-infested areas. For those who enjoy gardening or walking in their yards, clear brush and mow tall grass around houses and at the edges of gardens. This may help reduce the tick population in the immediate area. Applications of chemical insecticides by a licensed professional in the spring (late May) and fall (September) will also control ticks, especially in heavily infested areas. Treatment will never get rid of all the ticks. Getting rid of small animal populations that host ticks will also decrease the tick population. When working in the garden, pruning   shrubs, or handling soil and vegetation, wear light-colored protective clothing and gloves. Spot-check often to prevent ticks from reaching the skin. Ticks cannot jump or fly. They will not drop from an above-ground perch onto a passing animal. Once a tick gains access to human skin it climbs  upward until it reaches a more protected area. For example, the back of the knee, groin, navel, armpit, ears, or nape of the neck. It then begins the slow process of embedding itself in the skin. °Campers, hikers, field workers, and others who spend time in wooded, brushy, or tall grassy areas can avoid exposure to ticks by using the following precautions: °· Wear light-colored clothing with a tight weave to spot ticks more easily and prevent contact with the skin. °· Wear long pants tucked into socks, long-sleeved shirts tucked into pants and enclosed shoes or boots along with insect repellent. °· Spray clothes with insect repellent containing either DEET or Permethrin. Only DEET can be used on exposed skin. Follow the manufacturer's directions carefully. °· Wear a hat and keep long hair pulled back. °· Stay on cleared, well-worn trails whenever possible. °· Spot-check yourself and others often for the presence of ticks on clothes. If you find one, there are likely to be others. Check thoroughly. °· Remove clothes after leaving tick-infested areas. If possible, wash them to eliminate any unseen ticks. Check yourself, your children and any pets from head to toe for the presence of ticks. °· Shower and shampoo. °You can greatly reduce your chances of contracting RMSF if you remove attached ticks as soon as possible. Regular checks of the body, including all body sites covered by hair (head, armpits, genitals), allow removal of the tick before rickettsial transmission. To remove an attached tick, use a forceps or tweezers to detach the intact tick without leaving mouth parts in the skin. The tick bite wound should be cleansed after tick removal. °Remember the most common symptoms of RMSF are fever, muscle aches, headache, and nausea or vomiting with a later onset of rash. If you get these symptoms after a tick bite and while living in an area where RMSF is found, RMSF should be suspected. If the disease is not  treated, it can cause death. See your caregiver immediately if you get these symptoms. Do this even if not aware of a tick bite. °Document Released: 05/21/2000 Document Revised: 06/23/2013 Document Reviewed: 01/11/2009 °ExitCare® Patient Information ©2015 [employer]. This information is not intended to replace advice given to you by your health care provider. Make sure you discuss any questions you have with your health care provider. ° °

## 2013-10-10 NOTE — Discharge Summary (Signed)
Tony Keltner Jr.  MR#: [id number]  DOB:05-05-1940  Date of Admission: 10/07/2013 Date of Discharge: 10/10/2013  Attending Physician:Adeleigh Barletta T  Patient's OXB:[id number] Tony Pate, MD  Consults:  none  Disposition: D/C home   Follow-up Appts:     Follow-up Information   Schedule an appointment as soon as possible for a visit with Viviana Simpler, MD. (call to schedule a visit in 10-14 days )    Specialties:  Internal Medicine, Pediatrics   Contact information:   [place] [address] [phone]       Tests Needing Follow-up: - f/u CT chest w/o contrast in 6 months - consider convalescent RMSF titer ~2 weeks post d/c, but pt has been treated so this would only be for confirmatory purposes and not required clinically  - recheck BP, renal function, and CBGs in 10-14 days   Discharge Diagnoses: Sepsis Suspected RMSF Hypotension  Acute renal failure   Metabolic acidosis / lactic acidosis  Thrombocytopenia  Elevated D-dimer  Uncontrolled DM  HLD  HTN  Left lower lobe nodule   Initial presentation: 73 y.o. male with history of diabetes mellitus, hypertension and hyperlipidemia was advised to come to the ER by his PCP after feeling weak and was found to be hypotensive. Patient was recently placed on antibiotics for right toe ingrown nail. His blood sugar was found to be around 257 and blood pressure was low in the 89Q systolic.   In the ER patient's blood pressure was low 80s and tachycardic. Patient was given 3 L bolus > still tachycardic and blood pressure minimally improved to the 90s. Labs reveal elevated lactic acid levels with acute renal failure and metabolic acidosis. D-dimer was elevated.   Hospital Course:  Sepsis (HR 130 + RR 27 + WBC <4) - probable RMSF -UA unremarkable - CXR unrevealing - CT chest w/o infiltrate -S/Sx highly suspicious for RMSF, although IgM 4 [place] fever negative, most accurate  assessment is a convalescent titer - patient at high risk secondary to multiple tick bites - complete course of doxycycline   Hypotension  -Resolved w/ volume resuscitation   Acute renal failure  -Improving to baseline w/ volume resuscitation  -baseline JJ~9.4   Metabolic acidosis / lactic acidosis  -due to above - resolved   Thrombocytopenia  -Resolving - futher suggestive of RMSF or Lyme   Elevated D-dimer  -Negative VQ scan - likely nonspecific elevation in setting of infection/inflamation   Uncontrolled DM  -hemoglobin A1c 7.6  -GFR improved to 65+ at time of d/c - resume usual home medications  HLD  -LDL 65 / HDL 38 - no change in tx plan   HTN  -BP reasonably well controlled - w/ normalization of renal fxn, will not change BP regimen at d/c    69mm Left lower lobe nodule  -nodule noted on CT abdom - CT of chest reveals small scattered pulmonary nodules with benign appearance - f/u CT in 6 months suggested    Medication List    STOP taking these medications       sulfamethoxazole-trimethoprim 800-160 MG per tablet  Commonly known as:  BACTRIM DS      TAKE these medications       acetaminophen-codeine 300-30 MG per tablet  Commonly known as:  TYLENOL #3  Take 1-2 tablets by mouth every 4 (four) hours as needed.     doxycycline 50 MG capsule  Commonly known as:  VIBRAMYCIN  Take 2 capsules (100  mg total) by mouth 2 (two) times daily.     glipiZIDE 5 MG 24 hr tablet  Commonly known as:  GLIPIZIDE XL  Take 1 tablet (5 mg total) by mouth daily.     ketoconazole 2 % shampoo  Commonly known as:  NIZORAL  Apply 1 application topically 2 (two) times a week.     lisinopril 10 MG tablet  Commonly known as:  PRINIVIL,ZESTRIL  Take 1 tablet (10 mg total) by mouth daily.     metFORMIN 1000 MG tablet  Commonly known as:  GLUCOPHAGE  Take 1 tablet (1,000 mg total) by mouth 2 (two) times daily with a meal.     mometasone 0.1 % cream  Commonly known as:  ELOCON    Apply 1 application topically 2 (two) times daily as needed.     sildenafil 100 MG tablet  Commonly known as:  VIAGRA  Take 50-100 mg by mouth daily as needed.     simvastatin 40 MG tablet  Commonly known as:  ZOCOR  Take 1 tablet (40 mg total) by mouth at bedtime.     triamcinolone cream 0.1 %  Commonly known as:  KENALOG  Apply 1 application topically 2 (two) times daily as needed.        Day of Discharge BP 128/68  Pulse 71  Temp(Src) 98.5 F (36.9 C) (Oral)  Resp 24  Ht 5\' 8"  (1.727 m)  Wt 80.196 kg (176 lb 12.8 oz)  BMI 26.89 kg/m2  SpO2 100%  Physical Exam: General: No acute respiratory distress Lungs: Clear to auscultation bilaterally without wheezes or crackles Cardiovascular: Regular rate and rhythm without murmur gallop or rub normal S1 and S2 Abdomen: Nontender, nondistended, soft, bowel sounds positive, no rebound, no ascites, no appreciable mass Extremities: No significant cyanosis, clubbing, or edema bilateral lower extremities  Results for orders placed during the hospital encounter of 10/07/13 (from the past 24 hour(s))  GLUCOSE, CAPILLARY     Status: Abnormal   Collection Time    10/09/13  4:28 PM      Result Value Ref Range   Glucose-Capillary 321 (*) 70 - 99 mg/dL  GLUCOSE, CAPILLARY     Status: Abnormal   Collection Time    10/09/13  7:58 PM      Result Value Ref Range   Glucose-Capillary 223 (*) 70 - 99 mg/dL  LIPID PANEL     Status: Abnormal   Collection Time    10/10/13  3:42 AM      Result Value Ref Range   Cholesterol 141  0 - 200 mg/dL   Triglycerides 192 (*) <150 mg/dL   HDL 38 (*) >39 mg/dL   Total CHOL/HDL Ratio 3.7     VLDL 38  0 - 40 mg/dL   LDL Cholesterol 65  0 - 99 mg/dL  GLUCOSE, CAPILLARY     Status: Abnormal   Collection Time    10/10/13  7:58 AM      Result Value Ref Range   Glucose-Capillary 145 (*) 70 - 99 mg/dL  BASIC METABOLIC PANEL     Status: Abnormal   Collection Time    10/10/13  9:04 AM      Result Value  Ref Range   Sodium 137  137 - 147 mEq/L   Potassium 4.9  3.7 - 5.3 mEq/L   Chloride 106  96 - 112 mEq/L   CO2 21  19 - 32 mEq/L   Glucose, Bld 222 (*) 70 - 99 mg/dL  BUN 25 (*) 6 - 23 mg/dL   Creatinine, Ser 1.20  0.50 - 1.35 mg/dL   Calcium 9.3  8.4 - 10.5 mg/dL   GFR calc non Af Amer 59 (*) >90 mL/min   GFR calc Af Amer 68 (*) >90 mL/min   Anion gap 10  5 - 15  CBC     Status: Abnormal   Collection Time    10/10/13  9:04 AM      Result Value Ref Range   WBC 3.3 (*) 4.0 - 10.5 K/uL   RBC 3.78 (*) 4.22 - 5.81 MIL/uL   Hemoglobin 11.9 (*) 13.0 - 17.0 g/dL   HCT 34.3 (*) 39.0 - 52.0 %   MCV 90.7  78.0 - 100.0 fL   MCH 31.5  26.0 - 34.0 pg   MCHC 34.7  30.0 - 36.0 g/dL   RDW 13.7  11.5 - 15.5 %   Platelets 135 (*) 150 - 400 K/uL  GLUCOSE, CAPILLARY     Status: Abnormal   Collection Time    10/10/13 12:29 PM      Result Value Ref Range   Glucose-Capillary 237 (*) 70 - 99 mg/dL    Time spent in discharge (includes decision making & examination of pt): > 30 minutes  10/10/2013, 1:10 PM   Cherene Altes, MD [facility] Office  [phone] Pager [phone]  On-Call/Text Page:      [email or web address]      password [facility]

## 2013-10-10 NOTE — Progress Notes (Signed)
Pt is alert and oriented. Pt has had no complaints this am. Dr. Thereasa Solo into talk to pt. Pt is being discharged to home per md orders. Both iv's removed from pt's arm, tips intact and pt has tolerated well. Nurse reviewed pt's prescription and pt had no questions. Nurse reviewed pt's d/c instructions with pt and pt had no questions. Central telemetry called and notified. Pt is leaving with wife to go home.

## 2013-10-14 LAB — CULTURE, BLOOD (ROUTINE X 2)
CULTURE: NO GROWTH
Culture: NO GROWTH

## 2013-10-21 ENCOUNTER — Encounter: Payer: Self-pay | Admitting: Internal Medicine

## 2013-10-21 ENCOUNTER — Ambulatory Visit (INDEPENDENT_AMBULATORY_CARE_PROVIDER_SITE_OTHER): Payer: Medicare HMO | Admitting: Internal Medicine

## 2013-10-21 VITALS — BP 140/70 | HR 60 | Temp 97.8°F | Wt 178.0 lb

## 2013-10-21 DIAGNOSIS — R918 Other nonspecific abnormal finding of lung field: Secondary | ICD-10-CM

## 2013-10-21 DIAGNOSIS — A419 Sepsis, unspecified organism: Secondary | ICD-10-CM

## 2013-10-21 NOTE — Progress Notes (Signed)
Subjective:    Patient ID: Tony Jackson., male    DOB: [birth year], 73 y.o.   MRN: [id number]  HPI Reviewed hospital course Feels better now Had sepsis--hypotension, etc Putative RMSF---treated with doxy Has had some tick bites this year  Had ingrown nail in right great toe that he worked on Then got infected and was treated with antibiotic at [facility] urgent care This didn't seem to be the source of infection  No fever Slight occasional  cough but dyspnea Mild dizziness--nothing new  Had pulmonary nodules on CT--needs follow up  Current Outpatient Prescriptions on File Prior to Visit  Medication Sig Dispense Refill  . glipiZIDE (GLIPIZIDE XL) 5 MG 24 hr tablet Take 1 tablet (5 mg total) by mouth daily.  90 tablet  3  . ketoconazole (NIZORAL) 2 % shampoo Apply 1 application topically 2 (two) times a week.  120 mL  5  . lisinopril (PRINIVIL,ZESTRIL) 10 MG tablet Take 1 tablet (10 mg total) by mouth daily.  90 tablet  3  . metFORMIN (GLUCOPHAGE) 1000 MG tablet Take 1 tablet (1,000 mg total) by mouth 2 (two) times daily with a meal.  180 tablet  3  . mometasone (ELOCON) 0.1 % cream Apply 1 application topically 2 (two) times daily as needed.  45 g  3  . sildenafil (VIAGRA) 100 MG tablet Take 50-100 mg by mouth daily as needed.       . simvastatin (ZOCOR) 40 MG tablet Take 1 tablet (40 mg total) by mouth at bedtime.  90 tablet  3  . triamcinolone cream (KENALOG) 0.1 % Apply 1 application topically 2 (two) times daily as needed.  45 g  3   No current facility-administered medications on file prior to visit.    No Known Allergies  Past Medical History  Diagnosis Date  . Diabetes mellitus   . Hyperlipidemia   . Psoriasis   . Hypertension   . ED (erectile dysfunction)   . History of nephrolithiasis   . Elevated PSA   . Frequency of urination     Past Surgical History  Procedure Laterality Date  . Tonsillectomy    . Nm myoview ltd      normal EF 56% 03/08  . Prostate  biopsy  01/05/2012    Procedure: BIOPSY TRANSRECTAL ULTRASONIC PROSTATE (TUBP);  Surgeon: [name], MD;  Location: [facility];  Service: Urology;  Laterality: N/A;  . Cataract extraction w/ intraocular lens implant Right 03/30/12    Dr Kathrin Penner    Family History  Problem Relation Age of Onset  . Cancer Mother     breast cancer  . Diabetes Mother   . Heart disease Neg Hx     History   Social History  . Marital Status: Married    Spouse Name: N/A    Number of Children: 2  . Years of Education: N/A   Occupational History  . Architect- paving    Social History Main Topics  . Smoking status: Former Smoker -- 1.00 packs/day for 5 years    Types: Cigarettes  . Smokeless tobacco: Current User    Types: Chew     Comment: QUIT SMOKING CIGARETTES 50 YRS AGO--  CHEWED TOBACCO FOR 40 YRS  . Alcohol Use: No  . Drug Use: No  . Sexual Activity: Not Currently   Other Topics Concern  . Not on file   Social History Narrative   No living will   Requests wife as health care  POA.   Would accept resuscitation attempts   Not sure about tube feeds   Review of Systems No rash other than psoriasis--TAC helps Appetite is okay Weight stable Sleeps fine    Objective:   Physical Exam  Constitutional: He appears well-developed and well-nourished. No distress.  Neck: Normal range of motion. Neck supple. No thyromegaly present.  Cardiovascular: Normal rate, regular rhythm and normal heart sounds.  Exam reveals no gallop.   No murmur heard. Pulmonary/Chest: Effort normal and breath sounds normal. No respiratory distress. He has no wheezes. He has no rales.  Abdominal: Soft. There is no tenderness.  Musculoskeletal: He exhibits no edema and no tenderness.  Lymphadenopathy:    He has no cervical adenopathy.  Skin: No rash noted.  Ingrown toenail resection area clean, dry and not inflamed  Psychiatric: He has a normal mood and affect. His behavior is normal.           Assessment & Plan:

## 2013-10-21 NOTE — Assessment & Plan Note (Signed)
Presumed RMSF Resolved now Discussed doing convalescent titer for this--he prefers not to check No ongoing Rx

## 2013-10-21 NOTE — Progress Notes (Signed)
Pre visit review using our clinic review tool, if applicable. No additional management support is needed unless otherwise documented below in the visit note. 

## 2013-10-21 NOTE — Assessment & Plan Note (Signed)
Only 5 pack year smoking history No worrisome features Will decide after 6 months whether to repeat the CT scan

## 2013-12-04 LAB — HM DIABETES EYE EXAM

## 2014-05-18 ENCOUNTER — Other Ambulatory Visit: Payer: Self-pay | Admitting: Internal Medicine

## 2014-05-19 ENCOUNTER — Inpatient Hospital Stay (HOSPITAL_COMMUNITY): Payer: Medicare HMO

## 2014-05-19 ENCOUNTER — Emergency Department (HOSPITAL_COMMUNITY): Payer: Medicare HMO

## 2014-05-19 ENCOUNTER — Encounter (HOSPITAL_COMMUNITY): Payer: Self-pay | Admitting: Emergency Medicine

## 2014-05-19 ENCOUNTER — Inpatient Hospital Stay (HOSPITAL_COMMUNITY)
Admission: EM | Admit: 2014-05-19 | Discharge: 2014-05-21 | DRG: 065 | Disposition: A | Discharge status: Home / Self Care | Payer: Medicare HMO | Attending: Internal Medicine | Admitting: Internal Medicine

## 2014-05-19 DIAGNOSIS — E785 Hyperlipidemia, unspecified: Secondary | ICD-10-CM | POA: Diagnosis present

## 2014-05-19 DIAGNOSIS — G8194 Hemiplegia, unspecified affecting left nondominant side: Secondary | ICD-10-CM | POA: Diagnosis present

## 2014-05-19 DIAGNOSIS — E119 Type 2 diabetes mellitus without complications: Secondary | ICD-10-CM | POA: Diagnosis present

## 2014-05-19 DIAGNOSIS — I63411 Cerebral infarction due to embolism of right middle cerebral artery: Secondary | ICD-10-CM | POA: Diagnosis present

## 2014-05-19 DIAGNOSIS — R918 Other nonspecific abnormal finding of lung field: Secondary | ICD-10-CM | POA: Diagnosis not present

## 2014-05-19 DIAGNOSIS — I2510 Atherosclerotic heart disease of native coronary artery without angina pectoris: Secondary | ICD-10-CM | POA: Diagnosis present

## 2014-05-19 DIAGNOSIS — Z87891 Personal history of nicotine dependence: Secondary | ICD-10-CM

## 2014-05-19 DIAGNOSIS — W19XXXA Unspecified fall, initial encounter: Secondary | ICD-10-CM | POA: Diagnosis present

## 2014-05-19 DIAGNOSIS — R5381 Other malaise: Secondary | ICD-10-CM | POA: Diagnosis not present

## 2014-05-19 DIAGNOSIS — G459 Transient cerebral ischemic attack, unspecified: Secondary | ICD-10-CM | POA: Diagnosis present

## 2014-05-19 DIAGNOSIS — E1159 Type 2 diabetes mellitus with other circulatory complications: Secondary | ICD-10-CM | POA: Diagnosis not present

## 2014-05-19 DIAGNOSIS — E1129 Type 2 diabetes mellitus with other diabetic kidney complication: Secondary | ICD-10-CM

## 2014-05-19 DIAGNOSIS — I6930 Unspecified sequelae of cerebral infarction: Secondary | ICD-10-CM | POA: Insufficient documentation

## 2014-05-19 DIAGNOSIS — I639 Cerebral infarction, unspecified: Secondary | ICD-10-CM

## 2014-05-19 DIAGNOSIS — R531 Weakness: Secondary | ICD-10-CM

## 2014-05-19 DIAGNOSIS — I63429 Cerebral infarction due to embolism of unspecified anterior cerebral artery: Secondary | ICD-10-CM | POA: Diagnosis not present

## 2014-05-19 DIAGNOSIS — I6789 Other cerebrovascular disease: Secondary | ICD-10-CM | POA: Diagnosis not present

## 2014-05-19 DIAGNOSIS — I10 Essential (primary) hypertension: Secondary | ICD-10-CM | POA: Diagnosis present

## 2014-05-19 HISTORY — DX: Solitary pulmonary nodule: R91.1

## 2014-05-19 LAB — RAPID URINE DRUG SCREEN, HOSP PERFORMED
AMPHETAMINES: NOT DETECTED
Barbiturates: NOT DETECTED
Benzodiazepines: NOT DETECTED
Cocaine: NOT DETECTED
OPIATES: NOT DETECTED
TETRAHYDROCANNABINOL: NOT DETECTED

## 2014-05-19 LAB — BASIC METABOLIC PANEL
ANION GAP: 10 (ref 5–15)
BUN: 25 mg/dL — AB (ref 6–23)
CO2: 25 mmol/L (ref 19–32)
CREATININE: 1.34 mg/dL (ref 0.50–1.35)
Calcium: 10.1 mg/dL (ref 8.4–10.5)
Chloride: 107 mmol/L (ref 96–112)
GFR, EST AFRICAN AMERICAN: 59 mL/min — AB (ref >90)
GFR, EST NON AFRICAN AMERICAN: 51 mL/min — AB (ref >90)
Glucose, Bld: 137 mg/dL — ABNORMAL HIGH (ref 70–99)
Potassium: 4.7 mmol/L (ref 3.5–5.1)
SODIUM: 142 mmol/L (ref 135–145)

## 2014-05-19 LAB — CBC
HCT: 42 % (ref 39.0–52.0)
HEMOGLOBIN: 14.4 g/dL (ref 13.0–17.0)
MCH: 30.8 pg (ref 26.0–34.0)
MCHC: 34.3 g/dL (ref 30.0–36.0)
MCV: 89.9 fL (ref 78.0–100.0)
PLATELETS: 165 10*3/uL (ref 150–400)
RBC: 4.67 MIL/uL (ref 4.22–5.81)
RDW: 13.5 % (ref 11.5–15.5)
WBC: 6.8 10*3/uL (ref 4.0–10.5)

## 2014-05-19 LAB — URINALYSIS, ROUTINE W REFLEX MICROSCOPIC
Bilirubin Urine: NEGATIVE
Glucose, UA: NEGATIVE mg/dL
HGB URINE DIPSTICK: NEGATIVE
KETONES UR: NEGATIVE mg/dL
Leukocytes, UA: NEGATIVE
Nitrite: NEGATIVE
PROTEIN: 30 mg/dL — AB
SPECIFIC GRAVITY, URINE: 1.021 (ref 1.005–1.030)
Urobilinogen, UA: 1 mg/dL (ref 0.0–1.0)
pH: 5.5 (ref 5.0–8.0)

## 2014-05-19 LAB — PROTIME-INR
INR: 0.96 (ref 0.00–1.49)
Prothrombin Time: 12.9 seconds (ref 11.6–15.2)

## 2014-05-19 LAB — DIFFERENTIAL
Basophils Absolute: 0.1 10*3/uL (ref 0.0–0.1)
Basophils Relative: 1 % (ref 0–1)
EOS PCT: 3 % (ref 0–5)
Eosinophils Absolute: 0.2 10*3/uL (ref 0.0–0.7)
Lymphocytes Relative: 25 % (ref 12–46)
Lymphs Abs: 1.7 10*3/uL (ref 0.7–4.0)
MONO ABS: 0.7 10*3/uL (ref 0.1–1.0)
Monocytes Relative: 10 % (ref 3–12)
NEUTROS ABS: 4.1 10*3/uL (ref 1.7–7.7)
NEUTROS PCT: 61 % (ref 43–77)

## 2014-05-19 LAB — APTT: aPTT: 29 seconds (ref 24–37)

## 2014-05-19 LAB — ETHANOL

## 2014-05-19 LAB — GLUCOSE, CAPILLARY: Glucose-Capillary: 216 mg/dL — ABNORMAL HIGH (ref 70–99)

## 2014-05-19 LAB — I-STAT TROPONIN, ED: TROPONIN I, POC: 0 ng/mL (ref 0.00–0.08)

## 2014-05-19 LAB — URINE MICROSCOPIC-ADD ON

## 2014-05-19 MED ORDER — GLIPIZIDE ER 5 MG PO TB24
5.0000 mg | ORAL_TABLET | Freq: Every day | ORAL | Status: DC
  Administered 2014-05-20: 5 mg via ORAL
  Filled 2014-05-19 (×3): qty 1

## 2014-05-19 MED ORDER — INSULIN ASPART 100 UNIT/ML ~~LOC~~ SOLN
0.0000 [IU] | Freq: Every day | SUBCUTANEOUS | Status: DC
  Administered 2014-05-19: 2 [IU] via SUBCUTANEOUS

## 2014-05-19 MED ORDER — ASPIRIN 325 MG PO TABS
325.0000 mg | ORAL_TABLET | Freq: Every day | ORAL | Status: DC
  Administered 2014-05-20 – 2014-05-21 (×2): 325 mg via ORAL
  Filled 2014-05-19 (×2): qty 1

## 2014-05-19 MED ORDER — STROKE: EARLY STAGES OF RECOVERY BOOK
Freq: Once | Status: AC
  Administered 2014-05-19
  Filled 2014-05-19: qty 1

## 2014-05-19 MED ORDER — SIMVASTATIN 40 MG PO TABS
40.0000 mg | ORAL_TABLET | Freq: Every day | ORAL | Status: DC
  Administered 2014-05-19 – 2014-05-20 (×2): 40 mg via ORAL
  Filled 2014-05-19 (×2): qty 1

## 2014-05-19 MED ORDER — LISINOPRIL 10 MG PO TABS
10.0000 mg | ORAL_TABLET | Freq: Every day | ORAL | Status: DC
  Administered 2014-05-20 – 2014-05-21 (×2): 10 mg via ORAL
  Filled 2014-05-19 (×2): qty 1

## 2014-05-19 MED ORDER — METFORMIN HCL 500 MG PO TABS
1000.0000 mg | ORAL_TABLET | Freq: Two times a day (BID) | ORAL | Status: DC
  Administered 2014-05-20 (×2): 1000 mg via ORAL
  Filled 2014-05-19 (×2): qty 2

## 2014-05-19 MED ORDER — ASPIRIN 300 MG RE SUPP: 300.0000 mg | Freq: Every day | RECTAL | Status: DC

## 2014-05-19 MED ORDER — INSULIN ASPART 100 UNIT/ML ~~LOC~~ SOLN
0.0000 [IU] | Freq: Three times a day (TID) | SUBCUTANEOUS | Status: DC
  Administered 2014-05-20: 3 [IU] via SUBCUTANEOUS
  Administered 2014-05-20: 1 [IU] via SUBCUTANEOUS

## 2014-05-19 NOTE — Progress Notes (Signed)
Patient arrived to 4N29 from ED. Alert and oriented x 4. Wife at bedside. Oriented to room and unit. Telemetry applied. Burnell Blanks, RN

## 2014-05-19 NOTE — ED Provider Notes (Signed)
CSN: [id number]     Arrival date & time 05/19/14  1919 History   First MD Initiated Contact with Patient 05/19/14 1938     Chief Complaint  Patient presents with  . Weakness   HPI Patient presents to the emergency room with complaints of weakness. The patient was doing yard work outside with a Orthoptist. The patient states he noticed that about 12:00 he started having difficulty with heaviness on the left side of his body. He noticed it was more difficult for him to use a wheelbarrow. He also noticed that he fell 3 times because of losing his balance since the symptoms started. He he also noted it was more difficult for him to get out of the car as well as take a shower. Denies any trouble with his speech. He denies any headache. He has no prior history of stroke in the past. Past Medical History  Diagnosis Date  . Diabetes mellitus   . Hyperlipidemia   . Psoriasis   . Hypertension   . ED (erectile dysfunction)   . History of nephrolithiasis   . Elevated PSA   . Frequency of urination    Past Surgical History  Procedure Laterality Date  . Tonsillectomy    . Nm myoview ltd      normal EF 56% 03/08  . Prostate biopsy  01/05/2012    Procedure: BIOPSY TRANSRECTAL ULTRASONIC PROSTATE (TUBP);  Surgeon: [name], MD;  Location: [facility];  Service: Urology;  Laterality: N/A;  . Cataract extraction w/ intraocular lens implant Right 03/30/12    Dr Kathrin Penner   Family History  Problem Relation Age of Onset  . Cancer Mother     breast cancer  . Diabetes Mother   . Heart disease Neg Hx    History  Substance Use Topics  . Smoking status: Former Smoker -- 1.00 packs/day for 5 years    Types: Cigarettes  . Smokeless tobacco: Current User    Types: Chew     Comment: QUIT SMOKING CIGARETTES 50 YRS AGO--  CHEWED TOBACCO FOR 40 YRS  . Alcohol Use: No    Review of Systems  All other systems reviewed and are negative.     Allergies  Review of patient's allergies  indicates no known allergies.  Home Medications   Prior to Admission medications   Medication Sig Start Date End Date Taking? Authorizing Provider  GLIPIZIDE XL 5 MG 24 hr tablet TAKE 1 TABLET DAILY 05/18/14   [name], MD  ketoconazole (NIZORAL) 2 % shampoo Apply 1 application topically 2 (two) times a week. 07/25/13   [name], MD  lisinopril (PRINIVIL,ZESTRIL) 10 MG tablet Take 1 tablet (10 mg total) by mouth daily. 03/19/13   [name], MD  metFORMIN (GLUCOPHAGE) 1000 MG tablet Take 1 tablet (1,000 mg total) by mouth 2 (two) times daily with a meal. 03/19/13   [name], MD  mometasone (ELOCON) 0.1 % cream APPLY TO THE AFFECTED AREA TWO TIMES A DAY AS NEEDED 05/18/14   [name], MD  sildenafil (VIAGRA) 100 MG tablet Take 50-100 mg by mouth daily as needed.     Historical Provider, MD  simvastatin (ZOCOR) 40 MG tablet Take 1 tablet (40 mg total) by mouth at bedtime. 03/19/13   [name], MD  triamcinolone cream (KENALOG) 0.1 % Apply 1 application topically 2 (two) times daily as needed. 07/25/13   [name], MD   BP 159/86 mmHg  Pulse  61  Temp(Src) 99.1 F (37.3 C) (Oral)  Resp 18  Ht 5\' 9"  (1.753 m)  Wt 175 lb (79.379 kg)  BMI 25.83 kg/m2  SpO2 97% Physical Exam  Constitutional: He is oriented to person, place, and time. He appears well-developed and well-nourished. No distress.  HENT:  Head: Normocephalic and atraumatic.  Right Ear: External ear normal.  Left Ear: External ear normal.  Mouth/Throat: Oropharynx is clear and moist.  Eyes: Conjunctivae are normal. Right eye exhibits no discharge. Left eye exhibits no discharge. No scleral icterus.  Neck: Neck supple. No tracheal deviation present.  Cardiovascular: Normal rate, regular rhythm and intact distal pulses.   Pulmonary/Chest: Effort normal and breath sounds normal. No stridor. No respiratory distress. He has no wheezes. He has no rales.  Abdominal: Soft. Bowel sounds are  normal. He exhibits no distension. There is no tenderness. There is no rebound and no guarding.  Musculoskeletal: He exhibits no edema or tenderness.  Neurological: He is alert and oriented to person, place, and time. He has normal strength. No cranial nerve deficit (no facial droop, extraocular movements intact, no slurred speech) or sensory deficit. He exhibits normal muscle tone. He displays no seizure activity. Coordination normal.  No pronator drift bilateral upper extrem, able to hold both legs off bed for 5 seconds, sensation intact in all extremities, no visual field cuts, no left or right sided neglect, normal finger-nose exam bilaterally, no nystagmus noted  NIH stroke scale of 0   Skin: Skin is warm and dry. No rash noted.  Psychiatric: He has a normal mood and affect.  Nursing note and vitals reviewed.   ED Course  Procedures (including critical care time) Labs Review Labs Reviewed  BASIC METABOLIC PANEL - Abnormal; Notable for the following:    Glucose, Bld 137 (*)    BUN 25 (*)    GFR calc non Af Amer 51 (*)    GFR calc Af Amer 59 (*)    All other components within normal limits  URINALYSIS, ROUTINE W REFLEX MICROSCOPIC - Abnormal; Notable for the following:    Protein, ur 30 (*)    All other components within normal limits  CBC  ETHANOL  PROTIME-INR  APTT  DIFFERENTIAL  URINE RAPID DRUG SCREEN (HOSP PERFORMED)  URINE MICROSCOPIC-ADD ON  Randolm Idol, ED    Imaging Review Ct Head Wo Contrast  05/19/2014   CLINICAL DATA:  74 year old male with left-sided weakness since noon today. Poor balance and multiple falls since noon.  EXAM: CT HEAD WITHOUT CONTRAST  TECHNIQUE: Contiguous axial images were obtained from the base of the skull through the vertex without intravenous contrast.  COMPARISON:  No priors.  FINDINGS: Mild cerebral atrophy. Patchy and confluent areas of decreased attenuation are noted throughout the deep and periventricular white matter of the  cerebral hemispheres bilaterally, compatible with chronic microvascular ischemic disease. Relatively well-defined foci of low attenuation in the medial aspect of the left cerebellar hemisphere adjacent to the fourth ventricle (image 7 of series 201), and in the inferior aspect of the left cerebellar hemisphere (image 4 of series 201), presumably from remote infarctions. No definite acute intracranial abnormalities. Specifically, no evidence of acute intracranial hemorrhage, no definite findings of acute/subacute cerebral ischemia, no mass, mass effect, hydrocephalus or abnormal intra or extra-axial fluid collections. Visualized paranasal sinuses and mastoids are well pneumatized. No acute displaced skull fractures are identified.  IMPRESSION: 1. No definite acute intracranial abnormalities. 2. Mild cerebral atrophy with chronic microvascular ischemic changes in the  cerebral white matter, and old lacunar infarctions in the left cerebellar hemisphere, as above.   Electronically Signed   By: Vinnie Langton M.D.   On: 05/19/2014 20:50     EKG Interpretation   Date/Time:  Tuesday May 19 2014 19:33:54 EDT Ventricular Rate:  63 PR Interval:  218 QRS Duration: 86 QT Interval:  378 QTC Calculation: 386 R Axis:   14 Text Interpretation:  Sinus rhythm with 1st degree A-V block , new since  last tracing Minimal voltage criteria for LVH, may be normal variant  Borderline ECG Confirmed by Deanne Bedgood  MD-J, Annmarie Plemmons ([id number]) on 05/19/2014 7:38:46  PM      MDM   Final diagnoses:  Stroke  Weakness    Pt is outside of TPA window.  NIH stroke scale is also zero.  Sx are concerning for possible stroke, TIA.  I Recommended admission for further evaluation.  Pt agrees for admission and further evaluation.    Dorie Rank, MD 05/20/14 [phone]

## 2014-05-19 NOTE — ED Notes (Signed)
Pt presents with left sided weakness that started at noon today- pt was using a wheel barrel when he noticed his left side became heavy.  Pt states that since then he has fallen 3 times due to losing his balance sine noon.  Left sided weakness present, pt alert and oriented X 4 and answering questions appropriately.

## 2014-05-19 NOTE — H&P (Signed)
Tony Jackson. is an 74 y.o. male.    Tony Jackson (pcp)  Chief Complaint:  HPI: 74 yo male with dm2, htn, hyperlipidemia, CAD, c/o left sided weakness, starting at about noon.  Pt also noted that he was stumbling.  Denies numbness, tingling, dysarthria, vision change.  Pt presented to the ED for evaluation and CT brain negative for acute process.  Pt will be admitted for possible CVA.    Past Medical History  Diagnosis Date  . Diabetes mellitus   . Hyperlipidemia   . Psoriasis   . Hypertension   . ED (erectile dysfunction)   . History of nephrolithiasis   . Elevated PSA   . Frequency of urination   . Lung nodule     Past Surgical History  Procedure Laterality Date  . Tonsillectomy  1960  . Nm myoview ltd      normal EF 56% 03/08  . Prostate biopsy  01/05/2012    Procedure: BIOPSY TRANSRECTAL ULTRASONIC PROSTATE (TUBP);  Surgeon: [name], MD;  Location: [facility];  Service: Urology;  Laterality: N/A;  . Cataract extraction w/ intraocular lens implant Right 03/30/12    Dr Kathrin Penner    Family History  Problem Relation Age of Onset  . Cancer Mother     breast cancer  . Diabetes Mother   . Heart disease Neg Hx    Social History:  reports that he has quit smoking. His smoking use included Cigarettes. He has a 5 pack-year smoking history. His smokeless tobacco use includes Chew. He reports that he does not drink alcohol or use illicit drugs.  Allergies: No Known Allergies Medications reviewed  (Not in a hospital admission)  Results for orders placed or performed during the hospital encounter of 05/19/14 (from the past 48 hour(s))  CBC  (at [facility])     Status: None   Collection Time: 05/19/14  7:37 PM  Result Value Ref Range   WBC 6.8 4.0 - 10.5 K/uL   RBC 4.67 4.22 - 5.81 MIL/uL   Hemoglobin 14.4 13.0 - 17.0 g/dL   HCT 42.0 39.0 - 52.0 %   MCV 89.9 78.0 - 100.0 fL   MCH 30.8 26.0 - 34.0 pg   MCHC 34.3 30.0 - 36.0 g/dL   RDW 13.5  11.5 - 15.5 %   Platelets 165 150 - 400 K/uL  Basic metabolic panel  (at [facility])     Status: Abnormal   Collection Time: 05/19/14  7:37 PM  Result Value Ref Range   Sodium 142 135 - 145 mmol/L   Potassium 4.7 3.5 - 5.1 mmol/L   Chloride 107 96 - 112 mmol/L   CO2 25 19 - 32 mmol/L   Glucose, Bld 137 (H) 70 - 99 mg/dL   BUN 25 (H) 6 - 23 mg/dL   Creatinine, Ser 1.34 0.50 - 1.35 mg/dL   Calcium 10.1 8.4 - 10.5 mg/dL   GFR calc non Af Amer 51 (L) >90 mL/min   GFR calc Af Amer 59 (L) >90 mL/min    Comment: (NOTE) The eGFR has been calculated using the CKD EPI equation. This calculation has not been validated in all clinical situations. eGFR's persistently <90 mL/min signify possible Chronic Kidney Disease.    Anion gap 10 5 - 15  Ethanol     Status: None   Collection Time: 05/19/14  7:58 PM  Result Value Ref Range   Alcohol, Ethyl (B) <5 0 - 9  mg/dL    Comment:        LOWEST DETECTABLE LIMIT FOR SERUM ALCOHOL IS 11 mg/dL FOR MEDICAL PURPOSES ONLY   Protime-INR     Status: None   Collection Time: 05/19/14  7:58 PM  Result Value Ref Range   Prothrombin Time 12.9 11.6 - 15.2 seconds   INR 0.96 0.00 - 1.49  APTT     Status: None   Collection Time: 05/19/14  7:58 PM  Result Value Ref Range   aPTT 29 24 - 37 seconds  Differential     Status: None   Collection Time: 05/19/14  7:58 PM  Result Value Ref Range   Neutrophils Relative % 61 43 - 77 %   Neutro Abs 4.1 1.7 - 7.7 K/uL   Lymphocytes Relative 25 12 - 46 %   Lymphs Abs 1.7 0.7 - 4.0 K/uL   Monocytes Relative 10 3 - 12 %   Monocytes Absolute 0.7 0.1 - 1.0 K/uL   Eosinophils Relative 3 0 - 5 %   Eosinophils Absolute 0.2 0.0 - 0.7 K/uL   Basophils Relative 1 0 - 1 %   Basophils Absolute 0.1 0.0 - 0.1 K/uL  I-stat troponin, ED (not at [facility])     Status: None   Collection Time: 05/19/14  8:06 PM  Result Value Ref Range   Troponin i, poc 0.00 0.00 - 0.08 ng/mL   Comment 3            Comment: Due to the release  kinetics of cTnI, a negative result within the first hours of the onset of symptoms does not rule out myocardial infarction with certainty. If myocardial infarction is still suspected, repeat the test at appropriate intervals.   Urine Drug Screen     Status: None   Collection Time: 05/19/14  8:11 PM  Result Value Ref Range   Opiates NONE DETECTED NONE DETECTED   Cocaine NONE DETECTED NONE DETECTED   Benzodiazepines NONE DETECTED NONE DETECTED   Amphetamines NONE DETECTED NONE DETECTED   Tetrahydrocannabinol NONE DETECTED NONE DETECTED   Barbiturates NONE DETECTED NONE DETECTED    Comment:        DRUG SCREEN FOR MEDICAL PURPOSES ONLY.  IF CONFIRMATION IS NEEDED FOR ANY PURPOSE, NOTIFY LAB WITHIN 5 DAYS.        LOWEST DETECTABLE LIMITS FOR URINE DRUG SCREEN Drug Class       Cutoff (ng/mL) Amphetamine      1000 Barbiturate      200 Benzodiazepine   836 Tricyclics       629 Opiates          300 Cocaine          300 THC              50   Urinalysis, Routine w reflex microscopic     Status: Abnormal   Collection Time: 05/19/14  8:11 PM  Result Value Ref Range   Color, Urine YELLOW YELLOW   APPearance CLEAR CLEAR   Specific Gravity, Urine 1.021 1.005 - 1.030   pH 5.5 5.0 - 8.0   Glucose, UA NEGATIVE NEGATIVE mg/dL   Hgb urine dipstick NEGATIVE NEGATIVE   Bilirubin Urine NEGATIVE NEGATIVE   Ketones, ur NEGATIVE NEGATIVE mg/dL   Protein, ur 30 (A) NEGATIVE mg/dL   Urobilinogen, UA 1.0 0.0 - 1.0 mg/dL   Nitrite NEGATIVE NEGATIVE   Leukocytes, UA NEGATIVE NEGATIVE  Urine microscopic-add on     Status: None   Collection  Time: 05/19/14  8:11 PM  Result Value Ref Range   Squamous Epithelial / LPF RARE RARE   WBC, UA 0-2 <3 WBC/hpf   Bacteria, UA RARE RARE   Ct Head Wo Contrast  05/19/2014   CLINICAL DATA:  74 year old male with left-sided weakness since noon today. Poor balance and multiple falls since noon.  EXAM: CT HEAD WITHOUT CONTRAST  TECHNIQUE: Contiguous axial images  were obtained from the base of the skull through the vertex without intravenous contrast.  COMPARISON:  No priors.  FINDINGS: Mild cerebral atrophy. Patchy and confluent areas of decreased attenuation are noted throughout the deep and periventricular white matter of the cerebral hemispheres bilaterally, compatible with chronic microvascular ischemic disease. Relatively well-defined foci of low attenuation in the medial aspect of the left cerebellar hemisphere adjacent to the fourth ventricle (image 7 of series 201), and in the inferior aspect of the left cerebellar hemisphere (image 4 of series 201), presumably from remote infarctions. No definite acute intracranial abnormalities. Specifically, no evidence of acute intracranial hemorrhage, no definite findings of acute/subacute cerebral ischemia, no mass, mass effect, hydrocephalus or abnormal intra or extra-axial fluid collections. Visualized paranasal sinuses and mastoids are well pneumatized. No acute displaced skull fractures are identified.  IMPRESSION: 1. No definite acute intracranial abnormalities. 2. Mild cerebral atrophy with chronic microvascular ischemic changes in the cerebral white matter, and old lacunar infarctions in the left cerebellar hemisphere, as above.   Electronically Signed   By: Vinnie Langton M.D.   On: 05/19/2014 20:50    Review of Systems  Constitutional: Negative.   HENT: Negative.   Eyes: Negative.   Respiratory: Negative.   Cardiovascular: Negative.   Gastrointestinal: Negative.   Genitourinary: Negative.   Musculoskeletal: Negative.   Skin: Negative.   Neurological: Positive for focal weakness. Negative for dizziness, tingling, tremors, sensory change, speech change, seizures and loss of consciousness.  Endo/Heme/Allergies: Negative.   Psychiatric/Behavioral: Negative.     Blood pressure 167/83, pulse 58, temperature 98.7 F (37.1 C), temperature source Oral, resp. rate 18, height '5\' 9"'  (1.753 m), weight 79.379  kg (175 lb), SpO2 98 %. Physical Exam  Constitutional: He is oriented to person, place, and time. He appears well-developed and well-nourished.  HENT:  Head: Normocephalic and atraumatic.  Mouth/Throat: No oropharyngeal exudate.  Eyes: Conjunctivae and EOM are normal. Pupils are equal, round, and reactive to light. No scleral icterus.  Neck: Normal range of motion. Neck supple. No JVD present. No tracheal deviation present. No thyromegaly present.  Cardiovascular: Normal rate and regular rhythm.  Exam reveals no gallop and no friction rub.   No murmur heard. Respiratory: Effort normal and breath sounds normal. No respiratory distress. He has no wheezes. He has no rales.  GI: Soft. Bowel sounds are normal. He exhibits no distension. There is no tenderness. There is no rebound and no guarding.  Musculoskeletal: Normal range of motion. He exhibits no edema or tenderness.  Lymphadenopathy:    He has no cervical adenopathy.  Neurological: He is alert and oriented to person, place, and time. He has normal reflexes. He displays normal reflexes. No cranial nerve deficit. He exhibits normal muscle tone. Coordination normal.  Negative pronator drift  Skin: Skin is warm and dry. No rash noted. No erythema. No pallor.  Psychiatric: He has a normal mood and affect. His behavior is normal. Judgment and thought content normal.     Assessment/Plan Weakness, ? Tia/CVA MRI brain/ MRA brain ordered NPO til passes nursing bedside swallow then diabetic,  heart healthy diet Check carotid ultrasound, cardiac echo, Check lipid, hga1c, esr, ana, tsh, pt, ptt  Dm2 fsbs ac and qhs, iss  Hypertension Cont current medications  Pulmonary nodules Check CT chest  DVT prophylaxis: scd  Jani Gravel 05/19/2014, 10:57 PM

## 2014-05-19 NOTE — ED Notes (Signed)
Hospitalist at the bedside.  To take to the floor once he is finished.

## 2014-05-20 ENCOUNTER — Inpatient Hospital Stay (HOSPITAL_COMMUNITY): Payer: Medicare HMO

## 2014-05-20 ENCOUNTER — Encounter (HOSPITAL_COMMUNITY): Payer: Self-pay | Admitting: Radiology

## 2014-05-20 DIAGNOSIS — R918 Other nonspecific abnormal finding of lung field: Secondary | ICD-10-CM

## 2014-05-20 DIAGNOSIS — R5381 Other malaise: Secondary | ICD-10-CM

## 2014-05-20 DIAGNOSIS — I10 Essential (primary) hypertension: Secondary | ICD-10-CM

## 2014-05-20 DIAGNOSIS — I6789 Other cerebrovascular disease: Secondary | ICD-10-CM

## 2014-05-20 DIAGNOSIS — I639 Cerebral infarction, unspecified: Secondary | ICD-10-CM

## 2014-05-20 DIAGNOSIS — I6930 Unspecified sequelae of cerebral infarction: Secondary | ICD-10-CM | POA: Insufficient documentation

## 2014-05-20 DIAGNOSIS — E785 Hyperlipidemia, unspecified: Secondary | ICD-10-CM

## 2014-05-20 DIAGNOSIS — E119 Type 2 diabetes mellitus without complications: Secondary | ICD-10-CM

## 2014-05-20 LAB — LIPID PANEL
CHOL/HDL RATIO: 4 ratio
CHOL/HDL RATIO: 4.1 ratio
CHOLESTEROL: 168 mg/dL (ref 0–200)
Cholesterol: 163 mg/dL (ref 0–200)
HDL: 41 mg/dL (ref >39)
HDL: 41 mg/dL (ref >39)
LDL CALC: 78 mg/dL (ref 0–99)
LDL Cholesterol: 71 mg/dL (ref 0–99)
Triglycerides: 218 mg/dL — ABNORMAL HIGH (ref <150)
Triglycerides: 279 mg/dL — ABNORMAL HIGH (ref <150)
VLDL: 44 mg/dL — AB (ref 0–40)
VLDL: 56 mg/dL — ABNORMAL HIGH (ref 0–40)

## 2014-05-20 LAB — COMPREHENSIVE METABOLIC PANEL
ALK PHOS: 40 U/L (ref 39–117)
ALT: 29 U/L (ref 0–53)
ANION GAP: 3 — AB (ref 5–15)
AST: 22 U/L (ref 0–37)
Albumin: 3.7 g/dL (ref 3.5–5.2)
BUN: 23 mg/dL (ref 6–23)
CO2: 31 mmol/L (ref 19–32)
CREATININE: 1.3 mg/dL (ref 0.50–1.35)
Calcium: 9.2 mg/dL (ref 8.4–10.5)
Chloride: 107 mmol/L (ref 96–112)
GFR calc Af Amer: 61 mL/min — ABNORMAL LOW (ref >90)
GFR calc non Af Amer: 53 mL/min — ABNORMAL LOW (ref >90)
Glucose, Bld: 133 mg/dL — ABNORMAL HIGH (ref 70–99)
POTASSIUM: 4.2 mmol/L (ref 3.5–5.1)
SODIUM: 141 mmol/L (ref 135–145)
TOTAL PROTEIN: 5.9 g/dL — AB (ref 6.0–8.3)
Total Bilirubin: 0.8 mg/dL (ref 0.3–1.2)

## 2014-05-20 LAB — CBC
HCT: 40.5 % (ref 39.0–52.0)
Hemoglobin: 13.6 g/dL (ref 13.0–17.0)
MCH: 30.5 pg (ref 26.0–34.0)
MCHC: 33.6 g/dL (ref 30.0–36.0)
MCV: 90.8 fL (ref 78.0–100.0)
Platelets: 155 10*3/uL (ref 150–400)
RBC: 4.46 MIL/uL (ref 4.22–5.81)
RDW: 13.5 % (ref 11.5–15.5)
WBC: 5.8 10*3/uL (ref 4.0–10.5)

## 2014-05-20 LAB — GLUCOSE, CAPILLARY
GLUCOSE-CAPILLARY: 219 mg/dL — AB (ref 70–99)
GLUCOSE-CAPILLARY: 102 mg/dL — AB (ref 70–99)
Glucose-Capillary: 135 mg/dL — ABNORMAL HIGH (ref 70–99)
Glucose-Capillary: 87 mg/dL (ref 70–99)

## 2014-05-20 LAB — TSH: TSH: 2.955 u[IU]/mL (ref 0.350–4.500)

## 2014-05-20 LAB — SEDIMENTATION RATE: SED RATE: 3 mm/h (ref 0–16)

## 2014-05-20 NOTE — Progress Notes (Signed)
  Echocardiogram 2D Echocardiogram has been performed.  Tony Jackson 05/20/2014, 2:59 PM

## 2014-05-20 NOTE — Progress Notes (Signed)
[facility] PROGRESS NOTE  Tony Jackson. WUJ:[id number] DOB: 1940/07/23 DOA: 05/19/2014 PCP: Viviana Simpler, MD  Assessment/Plan: 1. Cerebrovascular accident -MRA showed acute small right MCA infarct -Risk factors age, hypertension, diabetes mellitus. -Seen by neurology -Think this is embolic -Plan to do TEE in the morning concerned about embolic stroke -Neurology also recommends to get a lower extremity Dopplers however we do not have any confirmation about if patient has any PFO. -Carotid Dopplers are pending. -PT OT eval and treat -Cleared by speech therapy -Continue with the Zocor -  2. Diabetes mellitus -On glipizide and metformin -Hemoglobin A1c 7.3 -Hold metformin while in the hospital -Continue to monitor on sliding scale insulin  3. Hypertension -Continue with lisinopril  Code Status: Full Family Communication: Patient Disposition Plan: PT eval pending   Consultants:  Neurology  HPI/Subjective: Tony Jackson. is an 74 y.o. male with a past medical history significant for DM, HTN, hypercholesterolemia, CAD, ED, and nephrolithiasis, brought in due to new onset of left sided weakness. 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  Objective: Filed Vitals:   05/20/14 1324  BP: 151/84  Pulse: 64  Temp: 97.7 F (36.5 C)  Resp: 20    Intake/Output Summary (Last 24 hours) at 05/20/14 1558 Last data filed at 05/20/14 1324  Gross per 24 hour  Intake    240 ml  Output      0 ml  Net    240 ml   Filed Weights   05/19/14 1934  Weight: 79.379 kg (175 lb)    Exam: Constitutional:  well-developed and well-nourished, not in distress HENT:  Head: Normocephalic and atraumatic.  Mouth/Throat: No oropharyngeal exudate.  Eyes: Conjunctivae and EOM are normal. Pupils are equal, round, and  reactive to light. No scleral icterus.  Neck: Normal range of motion. Neck supple. No JVD present. No tracheal deviation present. No thyromegaly present.  Cardiovascular: Normal rate and regular rhythm. Exam reveals no gallop and no friction rub.  No murmur heard. Respiratory: Effort normal and breath sounds normal. No respiratory distress. He has no wheezes. He has no rales.  GI: Soft. Bowel sounds are normal. He exhibits no distension. There is no tenderness. There is no rebound and no guarding.  Musculoskeletal: Normal range of motion. He exhibits no edema or tenderness.  Lymphadenopathy:   He has no cervical adenopathy.  Neurological: He is alert and oriented to person, place, and time. He has normal reflexes. He displays normal reflexes. No cranial nerve deficit. He exhibits normal muscle tone. Coordination normal.   Data Reviewed: Basic Metabolic Panel:  Recent Labs Lab 05/19/14 1937 05/20/14 0514  NA 142 141  K 4.7 4.2  CL 107 107  CO2 25 31  GLUCOSE 137* 133*  BUN 25* 23  CREATININE 1.34 1.30  CALCIUM 10.1 9.2   Liver Function Tests:  Recent Labs Lab 05/20/14 0514  AST 22  ALT 29  ALKPHOS 40  BILITOT 0.8  PROT 5.9*  ALBUMIN 3.7   No results for input(s): LIPASE, AMYLASE in the last 168 hours. No results for input(s): AMMONIA in the last 168 hours. CBC:  Recent Labs Lab 05/19/14 1937 05/19/14 1958 05/20/14 0514  WBC 6.8  --  5.8  NEUTROABS  --  4.1  --   HGB 14.4  --  13.6  HCT 42.0  --  40.5  MCV 89.9  --  90.8  PLT 165  --  155   Cardiac Enzymes: No results for input(s): CKTOTAL, CKMB, CKMBINDEX, TROPONINI in the last 168 hours. BNP (last 3 results) No results for input(s): BNP in the last 8760 hours.  ProBNP (last 3 results) No results for input(s): PROBNP in the last 8760 hours.  CBG:  Recent Labs Lab 05/19/14 2338 05/20/14 0649 05/20/14 1131  GLUCAP 216* 135* 219*    No results found for this or any previous visit (from the past  240 hour(s)).   Studies: Ct Head Wo Contrast  05/19/2014   CLINICAL DATA:  74 year old male with left-sided weakness since noon today. Poor balance and multiple falls since noon.  EXAM: CT HEAD WITHOUT CONTRAST  TECHNIQUE: Contiguous axial images were obtained from the base of the skull through the vertex without intravenous contrast.  COMPARISON:  No priors.  FINDINGS: Mild cerebral atrophy. Patchy and confluent areas of decreased attenuation are noted throughout the deep and periventricular white matter of the cerebral hemispheres bilaterally, compatible with chronic microvascular ischemic disease. Relatively well-defined foci of low attenuation in the medial aspect of the left cerebellar hemisphere adjacent to the fourth ventricle (image 7 of series 201), and in the inferior aspect of the left cerebellar hemisphere (image 4 of series 201), presumably from remote infarctions. No definite acute intracranial abnormalities. Specifically, no evidence of acute intracranial hemorrhage, no definite findings of acute/subacute cerebral ischemia, no mass, mass effect, hydrocephalus or abnormal intra or extra-axial fluid collections. Visualized paranasal sinuses and mastoids are well pneumatized. No acute displaced skull fractures are identified.  IMPRESSION: 1. No definite acute intracranial abnormalities. 2. Mild cerebral atrophy with chronic microvascular ischemic changes in the cerebral white matter, and old lacunar infarctions in the left cerebellar hemisphere, as above.   Electronically Signed   By: Vinnie Langton M.D.   On: 05/19/2014 20:50   Ct Chest Wo Contrast  05/20/2014   CLINICAL DATA:  Follow up lung nodules.  EXAM: CT CHEST WITHOUT CONTRAST  TECHNIQUE: Multidetector CT imaging of the chest was performed following the standard protocol without IV contrast.  COMPARISON:  Chest CT 10/09/2013  FINDINGS: Multiple tiny nodules are scattered throughout both lungs that are unchanged from prior exam. These all  measure less than 3 mm. No new nodules are seen. No dominant mass. Mild hypoventilatory change at the lung bases. No consolidation to suggest pneumonia.  The heart size is normal. Thoracic aorta is normal in caliber. No mediastinal or evidence of hilar adenopathy. No pleural or pericardial effusion. No acute abnormality in the included upper abdomen. There are no acute or suspicious osseous abnormalities.  IMPRESSION: Stable multiple bilateral pulmonary nodules, all measuring less than 3 mm. An additional six-month follow-up could be considered for 1 year assessment.   Electronically Signed   By: Jeb Levering M.D.   On: 05/20/2014 01:04   Mr Brain Wo Contrast  05/20/2014   CLINICAL DATA:  LEFT-sided weakness beginning at noon today, fell 3 times since noon today due to gait imbalance. History of diabetes, hyperlipidemia.  EXAM: MRI HEAD WITHOUT CONTRAST  MRA HEAD WITHOUT CONTRAST  TECHNIQUE: Multiplanar, multiecho pulse sequences of the brain and surrounding structures were obtained without intravenous contrast. Angiographic images of the head were obtained using MRA technique without contrast.  COMPARISON:  CT of the head May 19, 2014 at 2026 hours  FINDINGS: MRI HEAD FINDINGS  Mildly motion degraded examination. Patchy 1.9 x 1.2 cm area of reduced  diffusion in RIGHT frontal convexity involving the pre central gyrus. Corresponding low ADC values. No susceptibility artifact to suggest hemorrhage.  Ventricles and sulci are normal for patient's age. No midline shift or mass effect. Patchy to confluent supratentorial and pontine white matter T2 hyperintensities. Remote bilateral small inferior cerebellar infarcts. Tiny bilateral basal ganglia prominent perivascular spaces.  No abnormal extra-axial fluid collections. Status post RIGHT ocular lens implant. Paranasal sinuses and mastoid air cells are well aerated. No abnormal sellar expansion. No cerebellar tonsillar ectopia. Bright T1 bone marrow signal most  consistent osteopenia.  MRA HEAD FINDINGS  Anterior circulation: Normal flow related enhancement of the included cervical, petrous, cavernous and supra clinoid internal carotid arteries. Patent anterior communicating artery. Normal flow related enhancement of the anterior and middle cerebral arteries, including more distal segments.  No large vessel occlusion, aneurysm. Focal mid to high-grade stenosis of proximal RIGHT M2 branch. Mild luminal irregularity of the mid to distal anterior and middle cerebral arteries.  Posterior circulation: Codominant vertebral arteries. Basilar artery is patent, with normal flow related enhancement of the main branch vessels. Diminutive RIGHT P1 segment with compensatory rib best RIGHT posterior communicating artery. Normal flow related enhancement of the posterior cerebral arteries.  No large vessel occlusion, high-grade stenosis, aneurysm. Mild luminal irregularity of the mid to distal posterior cerebral arteries bilaterally.  IMPRESSION: MRI HEAD: Acute small RIGHT middle cerebral artery territory infarct.  Moderate to severe white matter changes suggest chronic small vessel ischemic disease. Remote bilateral small inferior cerebellar infarcts.  MRA HEAD: Focal mid to high-grade stenosis of proximal RIGHT M2 branch without large vessel occlusion.  Mild luminal irregularity of the mid to distal cerebral arteries most consistent with atherosclerosis.   Electronically Signed   By: Elon Alas   On: 05/20/2014 01:52   Mr Jodene Nam Head/brain Wo Cm  05/20/2014   CLINICAL DATA:  LEFT-sided weakness beginning at noon today, fell 3 times since noon today due to gait imbalance. History of diabetes, hyperlipidemia.  EXAM: MRI HEAD WITHOUT CONTRAST  MRA HEAD WITHOUT CONTRAST  TECHNIQUE: Multiplanar, multiecho pulse sequences of the brain and surrounding structures were obtained without intravenous contrast. Angiographic images of the head were obtained using MRA technique without  contrast.  COMPARISON:  CT of the head May 19, 2014 at 2026 hours  FINDINGS: MRI HEAD FINDINGS  Mildly motion degraded examination. Patchy 1.9 x 1.2 cm area of reduced diffusion in RIGHT frontal convexity involving the pre central gyrus. Corresponding low ADC values. No susceptibility artifact to suggest hemorrhage.  Ventricles and sulci are normal for patient's age. No midline shift or mass effect. Patchy to confluent supratentorial and pontine white matter T2 hyperintensities. Remote bilateral small inferior cerebellar infarcts. Tiny bilateral basal ganglia prominent perivascular spaces.  No abnormal extra-axial fluid collections. Status post RIGHT ocular lens implant. Paranasal sinuses and mastoid air cells are well aerated. No abnormal sellar expansion. No cerebellar tonsillar ectopia. Bright T1 bone marrow signal most consistent osteopenia.  MRA HEAD FINDINGS  Anterior circulation: Normal flow related enhancement of the included cervical, petrous, cavernous and supra clinoid internal carotid arteries. Patent anterior communicating artery. Normal flow related enhancement of the anterior and middle cerebral arteries, including more distal segments.  No large vessel occlusion, aneurysm. Focal mid to high-grade stenosis of proximal RIGHT M2 branch. Mild luminal irregularity of the mid to distal anterior and middle cerebral arteries.  Posterior circulation: Codominant vertebral arteries. Basilar artery is patent, with normal flow related enhancement of the main branch vessels. Diminutive RIGHT  P1 segment with compensatory rib best RIGHT posterior communicating artery. Normal flow related enhancement of the posterior cerebral arteries.  No large vessel occlusion, high-grade stenosis, aneurysm. Mild luminal irregularity of the mid to distal posterior cerebral arteries bilaterally.  IMPRESSION: MRI HEAD: Acute small RIGHT middle cerebral artery territory infarct.  Moderate to severe white matter changes suggest  chronic small vessel ischemic disease. Remote bilateral small inferior cerebellar infarcts.  MRA HEAD: Focal mid to high-grade stenosis of proximal RIGHT M2 branch without large vessel occlusion.  Mild luminal irregularity of the mid to distal cerebral arteries most consistent with atherosclerosis.   Electronically Signed   By: Elon Alas   On: 05/20/2014 01:52    Scheduled Meds: . aspirin  300 mg Rectal Daily   Or  . aspirin  325 mg Oral Daily  . glipiZIDE  5 mg Oral Q breakfast  . insulin aspart  0-5 Units Subcutaneous QHS  . insulin aspart  0-9 Units Subcutaneous TID WC  . lisinopril  10 mg Oral Daily  . metFORMIN  1,000 mg Oral BID WC  . simvastatin  40 mg Oral QHS   Continuous Infusions:   Active Problems:   Diabetes   CAD (coronary artery disease)   Stroke   TIA (transient ischemic attack)    Time spent: 40 minutes    [facility] Pager [phone]. If 7PM-7AM, please contact night-coverage at [email or web address], password [facility] 05/20/2014, 3:58 PM  LOS: 1 day

## 2014-05-20 NOTE — Evaluation (Signed)
Physical Therapy Evaluation Patient Details Name: Tony Jackson. MRN: [id number] DOB: 09/11/1940 Today's Date: 05/20/2014   History of Present Illness  74 yo male with DM2, htn, hyperlipidemia, CAD, c/o left sided weakness, starting at about noon on 05/19/14. Pt also noted that he was stumbling and fell x3 prior to coming to the hospital. Denies numbness, tingling, dysarthria, vision change or dysphagia. Pt presented to the ED for evaluation and CT brain negative for acute process on 05/19/14, but MRA on 05/20/14 indicated acute small right MCA territory infarct.  Past Medical History  Diagnosis Date  . Diabetes mellitus   . Hyperlipidemia   . Psoriasis   . Hypertension   . ED (erectile dysfunction)   . History of nephrolithiasis   . Elevated PSA   . Frequency of urination   . Lung nodule    Past Surgical History  Procedure Laterality Date  . Tonsillectomy  1960  . Nm myoview ltd      normal EF 56% 03/08  . Prostate biopsy  01/05/2012    Procedure: BIOPSY TRANSRECTAL ULTRASONIC PROSTATE (TUBP);  Surgeon: [name], MD;  Location: [facility];  Service: Urology;  Laterality: N/A;  . Cataract extraction w/ intraocular lens implant Right 03/30/12    Dr Kathrin Penner     Clinical Impression   Pt admitted with above diagnosis. Pt currently with functional limitations due to the deficits listed below (see PT Problem List).  Pt will benefit from skilled PT to increase their independence and safety with mobility to allow discharge to the venue listed below.       Follow Up Recommendations Outpatient PT;Other (comment) (for balance; likely more Outpatient  OT needs)    Equipment Recommendations  Cane    Recommendations for Other Services OT consult     Precautions / Restrictions Precautions Precautions: Fall Precaution Comments: Fall risk greatly reduced with UE support      Mobility  Bed Mobility                  Transfers Overall transfer level:  Needs assistance Equipment used: None Transfers: Sit to/from Stand Sit to Stand: Supervision         General transfer comment: minimal use of hands  Ambulation/Gait Ambulation/Gait assistance: Supervision Ambulation Distance (Feet): 500 Feet Assistive device: None Gait Pattern/deviations: Step-through pattern;WFL(Within Functional Limits) Gait velocity: grossly WNL   General Gait Details: Overall walking well on level floor with minimal distractions; no loss of balance with backwards walking  Stairs Stairs: Yes Stairs assistance: Min assist Stair Management: One rail Right;Forwards (alternating patter ascending; step-to for most of descent) Number of Stairs: 12 General stair comments: managed ascending steps well with use of R rail; Noted two losses of balance descending when he lead with RLE and weaker L side had to control descent  Wheelchair Mobility    Modified Rankin (Stroke Patients Only)       Balance                                 Standardized Balance Assessment Standardized Balance Assessment : Berg Balance Test Berg Balance Test Sit to Stand: Able to stand  independently using hands Standing Unsupported: Able to stand safely 2 minutes Sitting with Back Unsupported but Feet Supported on Floor or Stool: Able to sit safely and securely 2 minutes Stand to Sit: Controls descent by using hands Transfers: Able to transfer safely, minor use  of hands Standing Unsupported with Eyes Closed: Able to stand 10 seconds safely Standing Ubsupported with Feet Together: Able to place feet together independently and stand 1 minute safely From Standing, Reach Forward with Outstretched Arm: Can reach confidently >25 cm (10") From Standing Position, Pick up Object from Floor: Able to pick up shoe safely and easily From Standing Position, Turn to Look Behind Over each Shoulder: Looks behind one side only/other side shows less weight shift Turn 360 Degrees: Able to  turn 360 degrees safely but slowly Standing Unsupported, Alternately Place Feet on Step/Stool: Needs assistance to keep from falling or unable to try Standing Unsupported, One Foot in Front: Able to plae foot ahead of the other independently and hold 30 seconds Standing on One Leg: Tries to lift leg/unable to hold 3 seconds but remains standing independently Total Score: 43         Pertinent Vitals/Pain Pain Assessment: No/denies pain    Home Living Family/patient expects to be discharged to:: Private residence Living Arrangements: Spouse/significant other Available Help at Discharge: Family;Friend(s) Type of Home: House Home Access: Stairs to enter   Technical brewer of Steps: 2 Home Layout: Two level;Bed/bath upstairs Home Equipment: None      Prior Function Level of Independence: Independent         Comments: Works in Electrical engineer Dominance   Dominant Hand: Right    Extremity/Trunk Assessment   Upper Extremity Assessment: Defer to OT evaluation (LUE weakness compared to R)           Lower Extremity Assessment: LLE deficits/detail   LLE Deficits / Details: minor wekness noted compared to R; ankle dorsiflexion 3+/5     Communication   Communication: No difficulties  Cognition Arousal/Alertness: Awake/alert Behavior During Therapy: WFL for tasks assessed/performed Overall Cognitive Status: Impaired/Different from baseline Area of Impairment: Memory     Memory: Decreased short-term memory         General Comments: Unable to recall signs and symptoms of stroke ("BE FAST") despite going over them at least 5 times during 30 minute PT eval    General Comments General comments (skin integrity, edema, etc.): Berg score of 43/56 is indicative of fall risk    Exercises        Assessment/Plan    PT Assessment Patient needs continued PT services  PT Diagnosis Difficulty walking   PT Problem List Decreased  strength;Decreased balance;Decreased knowledge of use of DME;Decreased safety awareness  PT Treatment Interventions DME instruction;Gait training;Stair training;Functional mobility training;Balance training;Cognitive remediation   PT Goals (Current goals can be found in the Care Plan section) Acute Rehab PT Goals Patient Stated Goal: hopes to get home tomorrow PT Goal Formulation: With patient Time For Goal Achievement: 05/27/14 Potential to Achieve Goals: Good    Frequency Min 4X/week   Barriers to discharge        Co-evaluation               End of Session Equipment Utilized During Treatment: Gait belt Activity Tolerance: Patient tolerated treatment well Patient left: in chair;with chair alarm set;with family/visitor present Nurse Communication: Mobility status         Time: 1333-1410 PT Time Calculation (min) (ACUTE ONLY): 37 min   Charges:   PT Evaluation $Initial PT Evaluation Tier I: 1 Procedure PT Treatments $Gait Training: 8-22 mins   PT G CodesQuin Hoop 05/20/2014, 4:32 PM  Roney Marion, PT  Acute  Rehabilitation Services Pager [phone] Office [phone]

## 2014-05-20 NOTE — Evaluation (Signed)
Speech Language Pathology Evaluation Patient Details Name: Tony Jackson. MRN: [id number] DOB: February 03, 1941 Today's Date: 05/20/2014 Time: 7425-9563 SLP Time Calculation (min) (ACUTE ONLY): 30 min  Problem List:  Patient Active Problem List   Diagnosis Date Noted  . Stroke 05/19/2014  . TIA (transient ischemic attack) 05/19/2014  . Sepsis 10/21/2013  . Pulmonary nodules 10/21/2013  . CAD (coronary artery disease) 10/08/2013  . Seborrhea 07/25/2013  . Prostatic intraepithelial neoplasia 04/12/2012  . Actinic keratosis 10/12/2011  . Routine general medical examination at a health care facility 10/07/2010  . NEPHROLITHIASIS, HX OF 03/18/2010  . ERECTILE DYSFUNCTION, ORGANIC 02/19/2008  . HYPERTENSION 12/07/2006  . Diabetes 11/13/2006  . HYPERLIPIDEMIA 11/13/2006  . PSORIASIS 11/13/2006   Past Medical History:  Past Medical History  Diagnosis Date  . Diabetes mellitus   . Hyperlipidemia   . Psoriasis   . Hypertension   . ED (erectile dysfunction)   . History of nephrolithiasis   . Elevated PSA   . Frequency of urination   . Lung nodule    Past Surgical History:  Past Surgical History  Procedure Laterality Date  . Tonsillectomy  1960  . Nm myoview ltd      normal EF 56% 03/08  . Prostate biopsy  01/05/2012    Procedure: BIOPSY TRANSRECTAL ULTRASONIC PROSTATE (TUBP);  Surgeon: [name], MD;  Location: [facility];  Service: Urology;  Laterality: N/A;  . Cataract extraction w/ intraocular lens implant Right 03/30/12    Dr Kathrin Penner   HPI:  74 yo male with DM2, htn, hyperlipidemia, CAD, c/o left sided weakness, starting at about noon on 05/19/14. Pt also noted that he was stumbling and fell x3 prior to coming to the hospital. Denies numbness, tingling, dysarthria, vision change or dysphagia. Pt presented to the ED for evaluation and CT brain negative for acute process on 05/19/14, but MRA on 05/20/14 indicated acute small right MCA territory infarct.     Assessment / Plan / Recommendation Clinical Impression   Pt exhibited appropriate speech/language and cognitive skills with various tasks including auditory comprehension, verbal expression, attention, problem solving and cognitive tasks appropriate for education level.  Safety awareness appeared intact with functional tasks and memory tasks were all [facility] as well.  Pt does not need any further speech/language services when d/c home.    SLP Assessment  Patient does not need any further Speech Language Pathology Services    Follow Up Recommendations  None    Frequency and Duration   n/a     Pertinent Vitals/Pain Pain Assessment: No/denies pain   SLP Goals  Patient/Family Stated Goal: return home  SLP Evaluation Prior Functioning  Cognitive/Linguistic Baseline: Within functional limits Type of Home: House  Lives With: Spouse Available Help at Discharge: Family;Friend(s) Education: High School Vocation: Full time employment   Cognition  Overall Cognitive Status: Within Functional Limits for tasks assessed Arousal/Alertness: Awake/alert Orientation Level: Oriented X4 Memory: Appears intact Awareness: Appears intact Problem Solving: Appears intact Safety/Judgment: Appears intact    Comprehension  Auditory Comprehension Overall Auditory Comprehension: Appears within functional limits for tasks assessed Yes/No Questions: Within Functional Limits Commands: Within Functional Limits Conversation: Complex Visual Recognition/Discrimination Discrimination: Not tested Reading Comprehension Reading Status: Within funtional limits    Expression Expression Primary Mode of Expression: Verbal Verbal Expression Overall Verbal Expression: Appears within functional limits for tasks assessed Initiation: No impairment Level of Generative/Spontaneous Verbalization: Conversation Repetition: No impairment Naming: No impairment Pragmatics: No impairment Non-Verbal Means of  Communication: Not applicable  Written Expression Dominant Hand: Right Written Expression: Not tested   Oral / Motor Oral Motor/Sensory Function Overall Oral Motor/Sensory Function: Appears within functional limits for tasks assessed Labial Symmetry: Within Functional Limits Lingual Symmetry: Within Functional Limits Lingual Strength: Within Functional Limits Facial ROM: Within Functional Limits Facial Symmetry: Within Functional Limits Facial Strength: Within Functional Limits Velum: Within Functional Limits Mandible: Within Functional Limits Motor Speech Overall Motor Speech: Appears within functional limits for tasks assessed Respiration: Within functional limits Phonation: Low vocal intensity;Other (comment) (wife stated this was present prior to CVA) Resonance: Within functional limits Articulation: Within functional limitis Intelligibility: Intelligible Motor Planning: Within functional limits Motor Speech Errors: Not applicable    Functional Assessment Tool Used: NOMS Functional Limitations: Spoken language comprehension Spoken Language Comprehension Current Status [phone]): 0 percent impaired, limited or restricted Spoken Language Comprehension Goal Status ([id number]): 0 percent impaired, limited or restricted Spoken Language Comprehension Discharge Status [phone]): 0 percent impaired, limited or restricted   Haliey Romberg,PAT, M.S., CCC-SLP 05/20/2014, 9:30 AM

## 2014-05-20 NOTE — Consult Note (Signed)
Referring Physician: Dr. Maudie Mercury    Chief Complaint: left hemiparesis  HPI:                                                                                                                                         Tony Jackson. is an 74 y.o. male with a past medical history significant for DM, HTN, hypercholesterolemia,  CAD, ED, and nephrolithiasis, brought in due to new onset of the above stated symptoms. Patient expressed that he was at work when around noon time yesterday developed heaviness and inability to move the left side which made him stumble and fall couple of times. Never had similar symptoms before. Did not get any better and decided to present to the ED for further evaluation. Denies HA, vertigo, double vision, difficulty swallowing, face weakness, slurred speech, language or vision impairment. Had MRI brain that I personally reviewed and showed an acute small infarct involving the right MCA territory. MRA head showed focal mid to high-grade stenosis of proximal RIGHT M2 branch without large vessel occlusion.  Date last known well: 05/19/14 Time last known well: noon time tPA Given: no, late presentation    Past Medical History  Diagnosis Date  . Diabetes mellitus   . Hyperlipidemia   . Psoriasis   . Hypertension   . ED (erectile dysfunction)   . History of nephrolithiasis   . Elevated PSA   . Frequency of urination   . Lung nodule     Past Surgical History  Procedure Laterality Date  . Tonsillectomy  1960  . Nm myoview ltd      normal EF 56% 03/08  . Prostate biopsy  01/05/2012    Procedure: BIOPSY TRANSRECTAL ULTRASONIC PROSTATE (TUBP);  Surgeon: [name], MD;  Location: [facility];  Service: Urology;  Laterality: N/A;  . Cataract extraction w/ intraocular lens implant Right 03/30/12    Dr Kathrin Penner    Family History  Problem Relation Age of Onset  . Cancer Mother     breast cancer  . Diabetes Mother   . Heart disease Neg Hx    Social  History:  reports that he has quit smoking. His smoking use included Cigarettes. He has a 5 pack-year smoking history. His smokeless tobacco use includes Chew. He reports that he does not drink alcohol or use illicit drugs. Family history: no brain tumors, brain aneurysms, or epilepsy. Allergies: No Known Allergies  Medications:  Scheduled: . aspirin  300 mg Rectal Daily   Or  . aspirin  325 mg Oral Daily  . glipiZIDE  5 mg Oral Q breakfast  . insulin aspart  0-5 Units Subcutaneous QHS  . insulin aspart  0-9 Units Subcutaneous TID WC  . lisinopril  10 mg Oral Daily  . metFORMIN  1,000 mg Oral BID WC  . simvastatin  40 mg Oral QHS    ROS:                                                                                                                                       History obtained from the patient and chart review  General ROS: negative for - chills, fatigue, fever, night sweats, weight gain or weight loss Psychological ROS: negative for - behavioral disorder, hallucinations, memory difficulties, mood swings or suicidal ideation Ophthalmic ROS: negative for - blurry vision, double vision, eye pain or loss of vision ENT ROS: negative for - epistaxis, nasal discharge, oral lesions, sore throat, tinnitus or vertigo Allergy and Immunology ROS: negative for - hives or itchy/watery eyes Hematological and Lymphatic ROS: negative for - bleeding problems, bruising or swollen lymph nodes Endocrine ROS: negative for - galactorrhea, hair pattern changes, polydipsia/polyuria or temperature intolerance Respiratory ROS: negative for - cough, hemoptysis, shortness of breath or wheezing Cardiovascular ROS: negative for - chest pain, dyspnea on exertion, edema or irregular heartbeat Gastrointestinal ROS: negative for - abdominal pain, diarrhea, hematemesis, nausea/vomiting or  stool incontinence Genito-Urinary ROS: negative for - dysuria, hematuria, incontinence or urinary frequency/urgency Musculoskeletal ROS: negative for - joint swelling Neurological ROS: as noted in HPI Dermatological ROS: negative for rash and skin lesion changes   Physical exam: pleasant male in no apparent distress. Blood pressure 149/86, pulse 58, temperature 98.2 F (36.8 C), temperature source Oral, resp. rate 18, height '5\' 9"'  (1.753 m), weight 79.379 kg (175 lb), SpO2 95 %. Head: normocephalic. Neck: supple, no bruits, no JVD. Cardiac: no murmurs. Lungs: clear. Abdomen: soft, no tender, no mass. Extremities: no edema. Skin: no rash Neurologic Examination:                                                                                                      General: Mental Status: Alert, oriented, thought content appropriate.  Speech fluent without evidence of aphasia.  Able to follow 3 step commands without difficulty. Cranial Nerves: II: Discs flat bilaterally; Visual fields grossly normal, pupils equal, round, reactive to light and accommodation III,IV,  VI: ptosis not present, extra-ocular motions intact bilaterally V,VII: smile symmetric, facial light touch sensation normal bilaterally VIII: hearing normal bilaterally IX,X: uvula rises symmetrically XI: bilateral shoulder shrug XII: midline tongue extension without atrophy or fasciculations Motor: Subtle weakness left arm-leg Tone and bulk:normal tone throughout; no atrophy noted Sensory: Pinprick and light touch intact throughout, bilaterally Deep Tendon Reflexes:  1+ all over4 Plantars: Right: downgoing   Left: mute Cerebellar: normal finger-to-nose,  normal heel-to-shin test Gait:  No tested due to safety reasons.     Results for orders placed or performed during the hospital encounter of 05/19/14 (from the past 48 hour(s))  CBC  (at [facility])     Status: None   Collection Time: 05/19/14  7:37 PM   Result Value Ref Range   WBC 6.8 4.0 - 10.5 K/uL   RBC 4.67 4.22 - 5.81 MIL/uL   Hemoglobin 14.4 13.0 - 17.0 g/dL   HCT 42.0 39.0 - 52.0 %   MCV 89.9 78.0 - 100.0 fL   MCH 30.8 26.0 - 34.0 pg   MCHC 34.3 30.0 - 36.0 g/dL   RDW 13.5 11.5 - 15.5 %   Platelets 165 150 - 400 K/uL  Basic metabolic panel  (at [facility])     Status: Abnormal   Collection Time: 05/19/14  7:37 PM  Result Value Ref Range   Sodium 142 135 - 145 mmol/L   Potassium 4.7 3.5 - 5.1 mmol/L   Chloride 107 96 - 112 mmol/L   CO2 25 19 - 32 mmol/L   Glucose, Bld 137 (H) 70 - 99 mg/dL   BUN 25 (H) 6 - 23 mg/dL   Creatinine, Ser 1.34 0.50 - 1.35 mg/dL   Calcium 10.1 8.4 - 10.5 mg/dL   GFR calc non Af Amer 51 (L) >90 mL/min   GFR calc Af Amer 59 (L) >90 mL/min    Comment: (NOTE) The eGFR has been calculated using the CKD EPI equation. This calculation has not been validated in all clinical situations. eGFR's persistently <90 mL/min signify possible Chronic Kidney Disease.    Anion gap 10 5 - 15  Ethanol     Status: None   Collection Time: 05/19/14  7:58 PM  Result Value Ref Range   Alcohol, Ethyl (B) <5 0 - 9 mg/dL    Comment:        LOWEST DETECTABLE LIMIT FOR SERUM ALCOHOL IS 11 mg/dL FOR MEDICAL PURPOSES ONLY   Protime-INR     Status: None   Collection Time: 05/19/14  7:58 PM  Result Value Ref Range   Prothrombin Time 12.9 11.6 - 15.2 seconds   INR 0.96 0.00 - 1.49  APTT     Status: None   Collection Time: 05/19/14  7:58 PM  Result Value Ref Range   aPTT 29 24 - 37 seconds  Differential     Status: None   Collection Time: 05/19/14  7:58 PM  Result Value Ref Range   Neutrophils Relative % 61 43 - 77 %   Neutro Abs 4.1 1.7 - 7.7 K/uL   Lymphocytes Relative 25 12 - 46 %   Lymphs Abs 1.7 0.7 - 4.0 K/uL   Monocytes Relative 10 3 - 12 %   Monocytes Absolute 0.7 0.1 - 1.0 K/uL   Eosinophils Relative 3 0 - 5 %   Eosinophils Absolute 0.2 0.0 - 0.7 K/uL   Basophils Relative 1 0 - 1 %    Basophils Absolute 0.1  0.0 - 0.1 K/uL  I-stat troponin, ED (not at [facility])     Status: None   Collection Time: 05/19/14  8:06 PM  Result Value Ref Range   Troponin i, poc 0.00 0.00 - 0.08 ng/mL   Comment 3            Comment: Due to the release kinetics of cTnI, a negative result within the first hours of the onset of symptoms does not rule out myocardial infarction with certainty. If myocardial infarction is still suspected, repeat the test at appropriate intervals.   Urine Drug Screen     Status: None   Collection Time: 05/19/14  8:11 PM  Result Value Ref Range   Opiates NONE DETECTED NONE DETECTED   Cocaine NONE DETECTED NONE DETECTED   Benzodiazepines NONE DETECTED NONE DETECTED   Amphetamines NONE DETECTED NONE DETECTED   Tetrahydrocannabinol NONE DETECTED NONE DETECTED   Barbiturates NONE DETECTED NONE DETECTED    Comment:        DRUG SCREEN FOR MEDICAL PURPOSES ONLY.  IF CONFIRMATION IS NEEDED FOR ANY PURPOSE, NOTIFY LAB WITHIN 5 DAYS.        LOWEST DETECTABLE LIMITS FOR URINE DRUG SCREEN Drug Class       Cutoff (ng/mL) Amphetamine      1000 Barbiturate      200 Benzodiazepine   161 Tricyclics       096 Opiates          300 Cocaine          300 THC              50   Urinalysis, Routine w reflex microscopic     Status: Abnormal   Collection Time: 05/19/14  8:11 PM  Result Value Ref Range   Color, Urine YELLOW YELLOW   APPearance CLEAR CLEAR   Specific Gravity, Urine 1.021 1.005 - 1.030   pH 5.5 5.0 - 8.0   Glucose, UA NEGATIVE NEGATIVE mg/dL   Hgb urine dipstick NEGATIVE NEGATIVE   Bilirubin Urine NEGATIVE NEGATIVE   Ketones, ur NEGATIVE NEGATIVE mg/dL   Protein, ur 30 (A) NEGATIVE mg/dL   Urobilinogen, UA 1.0 0.0 - 1.0 mg/dL   Nitrite NEGATIVE NEGATIVE   Leukocytes, UA NEGATIVE NEGATIVE  Urine microscopic-add on     Status: None   Collection Time: 05/19/14  8:11 PM  Result Value Ref Range   Squamous Epithelial / LPF RARE RARE   WBC, UA 0-2 <3 WBC/hpf    Bacteria, UA RARE RARE  Glucose, capillary     Status: Abnormal   Collection Time: 05/19/14 11:38 PM  Result Value Ref Range   Glucose-Capillary 216 (H) 70 - 99 mg/dL   Comment 1 Notify RN    Comment 2 Document in Chart   CBC     Status: None   Collection Time: 05/20/14  5:14 AM  Result Value Ref Range   WBC 5.8 4.0 - 10.5 K/uL   RBC 4.46 4.22 - 5.81 MIL/uL   Hemoglobin 13.6 13.0 - 17.0 g/dL   HCT 40.5 39.0 - 52.0 %   MCV 90.8 78.0 - 100.0 fL   MCH 30.5 26.0 - 34.0 pg   MCHC 33.6 30.0 - 36.0 g/dL   RDW 13.5 11.5 - 15.5 %   Platelets 155 150 - 400 K/uL   Ct Head Wo Contrast  05/19/2014   CLINICAL DATA:  74 year old male with left-sided weakness since noon today. Poor balance and multiple falls since noon.  EXAM: CT  HEAD WITHOUT CONTRAST  TECHNIQUE: Contiguous axial images were obtained from the base of the skull through the vertex without intravenous contrast.  COMPARISON:  No priors.  FINDINGS: Mild cerebral atrophy. Patchy and confluent areas of decreased attenuation are noted throughout the deep and periventricular white matter of the cerebral hemispheres bilaterally, compatible with chronic microvascular ischemic disease. Relatively well-defined foci of low attenuation in the medial aspect of the left cerebellar hemisphere adjacent to the fourth ventricle (image 7 of series 201), and in the inferior aspect of the left cerebellar hemisphere (image 4 of series 201), presumably from remote infarctions. No definite acute intracranial abnormalities. Specifically, no evidence of acute intracranial hemorrhage, no definite findings of acute/subacute cerebral ischemia, no mass, mass effect, hydrocephalus or abnormal intra or extra-axial fluid collections. Visualized paranasal sinuses and mastoids are well pneumatized. No acute displaced skull fractures are identified.  IMPRESSION: 1. No definite acute intracranial abnormalities. 2. Mild cerebral atrophy with chronic microvascular ischemic changes in  the cerebral white matter, and old lacunar infarctions in the left cerebellar hemisphere, as above.   Electronically Signed   By: Vinnie Langton M.D.   On: 05/19/2014 20:50   Ct Chest Wo Contrast  05/20/2014   CLINICAL DATA:  Follow up lung nodules.  EXAM: CT CHEST WITHOUT CONTRAST  TECHNIQUE: Multidetector CT imaging of the chest was performed following the standard protocol without IV contrast.  COMPARISON:  Chest CT 10/09/2013  FINDINGS: Multiple tiny nodules are scattered throughout both lungs that are unchanged from prior exam. These all measure less than 3 mm. No new nodules are seen. No dominant mass. Mild hypoventilatory change at the lung bases. No consolidation to suggest pneumonia.  The heart size is normal. Thoracic aorta is normal in caliber. No mediastinal or evidence of hilar adenopathy. No pleural or pericardial effusion. No acute abnormality in the included upper abdomen. There are no acute or suspicious osseous abnormalities.  IMPRESSION: Stable multiple bilateral pulmonary nodules, all measuring less than 3 mm. An additional six-month follow-up could be considered for 1 year assessment.   Electronically Signed   By: Jeb Levering M.D.   On: 05/20/2014 01:04   Mr Brain Wo Contrast  05/20/2014   CLINICAL DATA:  LEFT-sided weakness beginning at noon today, fell 3 times since noon today due to gait imbalance. History of diabetes, hyperlipidemia.  EXAM: MRI HEAD WITHOUT CONTRAST  MRA HEAD WITHOUT CONTRAST  TECHNIQUE: Multiplanar, multiecho pulse sequences of the brain and surrounding structures were obtained without intravenous contrast. Angiographic images of the head were obtained using MRA technique without contrast.  COMPARISON:  CT of the head May 19, 2014 at 2026 hours  FINDINGS: MRI HEAD FINDINGS  Mildly motion degraded examination. Patchy 1.9 x 1.2 cm area of reduced diffusion in RIGHT frontal convexity involving the pre central gyrus. Corresponding low ADC values. No  susceptibility artifact to suggest hemorrhage.  Ventricles and sulci are normal for patient's age. No midline shift or mass effect. Patchy to confluent supratentorial and pontine white matter T2 hyperintensities. Remote bilateral small inferior cerebellar infarcts. Tiny bilateral basal ganglia prominent perivascular spaces.  No abnormal extra-axial fluid collections. Status post RIGHT ocular lens implant. Paranasal sinuses and mastoid air cells are well aerated. No abnormal sellar expansion. No cerebellar tonsillar ectopia. Bright T1 bone marrow signal most consistent osteopenia.  MRA HEAD FINDINGS  Anterior circulation: Normal flow related enhancement of the included cervical, petrous, cavernous and supra clinoid internal carotid arteries. Patent anterior communicating artery. Normal flow related enhancement of  the anterior and middle cerebral arteries, including more distal segments.  No large vessel occlusion, aneurysm. Focal mid to high-grade stenosis of proximal RIGHT M2 branch. Mild luminal irregularity of the mid to distal anterior and middle cerebral arteries.  Posterior circulation: Codominant vertebral arteries. Basilar artery is patent, with normal flow related enhancement of the main branch vessels. Diminutive RIGHT P1 segment with compensatory rib best RIGHT posterior communicating artery. Normal flow related enhancement of the posterior cerebral arteries.  No large vessel occlusion, high-grade stenosis, aneurysm. Mild luminal irregularity of the mid to distal posterior cerebral arteries bilaterally.  IMPRESSION: MRI HEAD: Acute small RIGHT middle cerebral artery territory infarct.  Moderate to severe white matter changes suggest chronic small vessel ischemic disease. Remote bilateral small inferior cerebellar infarcts.  MRA HEAD: Focal mid to high-grade stenosis of proximal RIGHT M2 branch without large vessel occlusion.  Mild luminal irregularity of the mid to distal cerebral arteries most consistent  with atherosclerosis.   Electronically Signed   By: Elon Alas   On: 05/20/2014 01:52   Mr Jodene Nam Head/brain Wo Cm  05/20/2014   CLINICAL DATA:  LEFT-sided weakness beginning at noon today, fell 3 times since noon today due to gait imbalance. History of diabetes, hyperlipidemia.  EXAM: MRI HEAD WITHOUT CONTRAST  MRA HEAD WITHOUT CONTRAST  TECHNIQUE: Multiplanar, multiecho pulse sequences of the brain and surrounding structures were obtained without intravenous contrast. Angiographic images of the head were obtained using MRA technique without contrast.  COMPARISON:  CT of the head May 19, 2014 at 2026 hours  FINDINGS: MRI HEAD FINDINGS  Mildly motion degraded examination. Patchy 1.9 x 1.2 cm area of reduced diffusion in RIGHT frontal convexity involving the pre central gyrus. Corresponding low ADC values. No susceptibility artifact to suggest hemorrhage.  Ventricles and sulci are normal for patient's age. No midline shift or mass effect. Patchy to confluent supratentorial and pontine white matter T2 hyperintensities. Remote bilateral small inferior cerebellar infarcts. Tiny bilateral basal ganglia prominent perivascular spaces.  No abnormal extra-axial fluid collections. Status post RIGHT ocular lens implant. Paranasal sinuses and mastoid air cells are well aerated. No abnormal sellar expansion. No cerebellar tonsillar ectopia. Bright T1 bone marrow signal most consistent osteopenia.  MRA HEAD FINDINGS  Anterior circulation: Normal flow related enhancement of the included cervical, petrous, cavernous and supra clinoid internal carotid arteries. Patent anterior communicating artery. Normal flow related enhancement of the anterior and middle cerebral arteries, including more distal segments.  No large vessel occlusion, aneurysm. Focal mid to high-grade stenosis of proximal RIGHT M2 branch. Mild luminal irregularity of the mid to distal anterior and middle cerebral arteries.  Posterior circulation: Codominant  vertebral arteries. Basilar artery is patent, with normal flow related enhancement of the main branch vessels. Diminutive RIGHT P1 segment with compensatory rib best RIGHT posterior communicating artery. Normal flow related enhancement of the posterior cerebral arteries.  No large vessel occlusion, high-grade stenosis, aneurysm. Mild luminal irregularity of the mid to distal posterior cerebral arteries bilaterally.  IMPRESSION: MRI HEAD: Acute small RIGHT middle cerebral artery territory infarct.  Moderate to severe white matter changes suggest chronic small vessel ischemic disease. Remote bilateral small inferior cerebellar infarcts.  MRA HEAD: Focal mid to high-grade stenosis of proximal RIGHT M2 branch without large vessel occlusion.  Mild luminal irregularity of the mid to distal cerebral arteries most consistent with atherosclerosis.   Electronically Signed   By: Elon Alas   On: 05/20/2014 01:52    Assessment: 74 y.o. male with complains of  new onset left sided weakness and MRI brain revealing a small acute infarct involving the right MCA territory. Of note, MRA brain with focal mid to high-grade stenosis of proximal RIGHT M2 branch which can account for patient's stroke. Complete stroke work up. Aspirin after passing swallowing evaluation and pending results stroke testing. Stroke team will resume care tomorrow.    Stroke Risk Factors - age, DM, HTN, hypercholesterolemia,  CAD  Plan: 1. HgbA1c, fasting lipid panel 2. MRI, MRA  of the brain without contrast 3. Echocardiogram 4. Carotid dopplers 5. Prophylactic therapy-aspirin 6. Risk factor modification 7. Telemetry monitoring 8. Frequent neuro checks 9. PT/OT SLP  Dorian Pod, MD [facility] [phone]  05/20/2014, 6:50 AM

## 2014-05-20 NOTE — Discharge Instructions (Signed)
If loop recorder placed, keep site dry for 3 days, no shower, may take dressing off in 3 days.  Follow up in 10 days at cardiology office for site check.

## 2014-05-20 NOTE — Consult Note (Signed)
ELECTROPHYSIOLOGY CONSULT NOTE  Patient ID: Tony Jackson. MRN: [id number], DOB/AGE: 1940-10-08   Admit date: 05/19/2014 Date of Consult: 05/20/2014  Primary Physician: Viviana Simpler, MD Primary Cardiologist: NONE Reason for Consultation: Cryptogenic stroke; recommendations regarding Implantable Loop Recorder  History of Present Illness 74 year old male,Adric C Mayra Brahm. was admitted on 05/19/2014 with Lt hemiparesis.  Imaging demonstrated acute small infarctinvolving the right MCA territory. MRA head showed focal mid to high-grade stenosis of proximal RIGHT M2 branch without large vessel occlusion.  He is undergoing workup for stroke including echocardiogram and carotid dopplers.  The patient has been monitored on telemetry which has demonstrated sinus rhythm with no arrhythmias.  Inpatient stroke work-up is to be completed with a TEE.   He has a hx of dm2, htn, hyperlipidemia, CAD, c/o left sided weakness, starting at about noon. Pt also noted that he was stumbling. Denied numbness, tingling, dysarthria, vision change.  Echocardiogram this admission demonstrated Pending.  Lab work is remarkable for Cr. 1.30, TG of 279 with LDL 71, neg troponin, TSH 2.955.  Prior to admission, the patient denies chest pain, shortness of breath, dizziness, palpitations, or syncope.  They are recovering from their stroke with plans still undecided at discharge for outpt rehab vs. Inpt.   EP has been asked to evaluate for placement of an implantable loop recorder to monitor for atrial fibrillation.  ROS is negative except as outlined above. No chest pain, possible rapid HR on occasion.   Past Medical History  Diagnosis Date  . Diabetes mellitus   . Hyperlipidemia   . Psoriasis   . Hypertension   . ED (erectile dysfunction)   . History of nephrolithiasis   . Elevated PSA   . Frequency of urination   . Lung nodule      Surgical History:  Past Surgical History  Procedure Laterality Date  .  Tonsillectomy  1960  . Nm myoview ltd      normal EF 56% 03/08  . Prostate biopsy  01/05/2012    Procedure: BIOPSY TRANSRECTAL ULTRASONIC PROSTATE (TUBP);  Surgeon: [name], MD;  Location: [facility];  Service: Urology;  Laterality: N/A;  . Cataract extraction w/ intraocular lens implant Right 03/30/12    Dr Kathrin Penner     Prescriptions prior to admission  Medication Sig Dispense Refill Last Dose  . GLIPIZIDE XL 5 MG 24 hr tablet TAKE 1 TABLET DAILY 90 tablet 0 Past Week at Unknown time  . ketoconazole (NIZORAL) 2 % shampoo Apply 1 application topically 2 (two) times a week. 120 mL 5 Past Week at Unknown time  . lisinopril (PRINIVIL,ZESTRIL) 10 MG tablet Take 1 tablet (10 mg total) by mouth daily. 90 tablet 3 Past Week at Unknown time  . metFORMIN (GLUCOPHAGE) 1000 MG tablet Take 1 tablet (1,000 mg total) by mouth 2 (two) times daily with a meal. 180 tablet 3 Past Week at Unknown time  . mometasone (ELOCON) 0.1 % cream APPLY TO THE AFFECTED AREA TWO TIMES A DAY AS NEEDED 45 g 0 Past Week at Unknown time  . sildenafil (VIAGRA) 100 MG tablet Take 50-100 mg by mouth daily as needed.    Past Month at Unknown time  . simvastatin (ZOCOR) 40 MG tablet Take 1 tablet (40 mg total) by mouth at bedtime. 90 tablet 3 05/18/2014 at Unknown time  . triamcinolone cream (KENALOG) 0.1 % Apply 1 application topically 2 (two) times daily as needed. 45 g 3 Past Week at Unknown  time    Inpatient Medications:  . aspirin  300 mg Rectal Daily   Or  . aspirin  325 mg Oral Daily  . glipiZIDE  5 mg Oral Q breakfast  . insulin aspart  0-5 Units Subcutaneous QHS  . insulin aspart  0-9 Units Subcutaneous TID WC  . lisinopril  10 mg Oral Daily  . metFORMIN  1,000 mg Oral BID WC  . simvastatin  40 mg Oral QHS    Allergies: No Known Allergies  History   Social History  . Marital Status: Married    Spouse Name: N/A  . Number of Children: 2  . Years of Education: N/A   Occupational History    . Architect- paving    Social History Main Topics  . Smoking status: Former Smoker -- 1.00 packs/day for 5 years    Types: Cigarettes  . Smokeless tobacco: Current User    Types: Chew     Comment: QUIT SMOKING CIGARETTES 50 YRS AGO--  CHEWED TOBACCO FOR 40 YRS  . Alcohol Use: No  . Drug Use: No  . Sexual Activity: Not Currently   Other Topics Concern  . Not on file   Social History Narrative   No living will   Requests wife as health care POA.   Would accept resuscitation attempts   Not sure about tube feeds     Family History  Problem Relation Age of Onset  . Cancer Mother     breast cancer  . Diabetes Mother   . Heart disease Neg Hx      Physical Exam: Filed Vitals:   05/20/14 0715 05/20/14 0928 05/20/14 1133 05/20/14 1324  BP: 146/89 145/82 140/78 151/84  Pulse: 56 70 63 64  Temp: 98.2 F (36.8 C) 98.6 F (37 C) 98.2 F (36.8 C) 97.7 F (36.5 C)  TempSrc: Oral Oral Oral Oral  Resp: 18 18 20 20   Height:      Weight:      SpO2: 96% 97% 98% 100%    GEN- The patient is well appearing, alert and oriented x 3 today.   Head- normocephalic, atraumatic Eyes-  Sclera clear, conjunctiva pink Ears- hearing intact Neck- supple, no JVD no bruits Lungs- Clear to ausculation bilaterally, normal work of breathing Heart- Regular rate and rhythm, no murmurs, rubs or gallops, PMI not laterally displaced GI- soft, NT, + BS, do not palpate liver spleen or masses Extremities- no clubbing, cyanosis, or edema MS- no significant deformity or atrophy Skin- no rash or lesion Psych- euthymic mood, full affect   Labs:   Lab Results  Component Value Date   WBC 5.8 05/20/2014   HGB 13.6 05/20/2014   HCT 40.5 05/20/2014   MCV 90.8 05/20/2014   PLT 155 05/20/2014    Recent Labs Lab 05/20/14 0514  NA 141  K 4.2  CL 107  CO2 31  BUN 23  CREATININE 1.30  CALCIUM 9.2  PROT 5.9*  BILITOT 0.8  ALKPHOS 40  ALT 29  AST 22  GLUCOSE 133*   Lab Results  Component  Value Date   TROPONINI <0.30 10/08/2013   Lab Results  Component Value Date   CHOL 168 05/20/2014   CHOL 163 05/20/2014   CHOL 141 10/10/2013   Lab Results  Component Value Date   HDL 41 05/20/2014   HDL 41 05/20/2014   HDL 38* 10/10/2013   Lab Results  Component Value Date   LDLCALC 71 05/20/2014   [place] 78 05/20/2014  LDLCALC 65 10/10/2013   Lab Results  Component Value Date   TRIG 279* 05/20/2014   TRIG 218* 05/20/2014   TRIG 192* 10/10/2013   Lab Results  Component Value Date   CHOLHDL 4.1 05/20/2014   CHOLHDL 4.0 05/20/2014   CHOLHDL 3.7 10/10/2013   Lab Results  Component Value Date   LDLDIRECT 90.4 02/16/2009   LDLDIRECT 155.1 12/07/2006    Lab Results  Component Value Date   DDIMER 1.62* 10/07/2013     Radiology/Studies: Ct Head Wo Contrast  05/19/2014   CLINICAL DATA:  74 year old male with left-sided weakness since noon today. Poor balance and multiple falls since noon.  EXAM: CT HEAD WITHOUT CONTRAST  TECHNIQUE: Contiguous axial images were obtained from the base of the skull through the vertex without intravenous contrast.  COMPARISON:  No priors.  FINDINGS: Mild cerebral atrophy. Patchy and confluent areas of decreased attenuation are noted throughout the deep and periventricular white matter of the cerebral hemispheres bilaterally, compatible with chronic microvascular ischemic disease. Relatively well-defined foci of low attenuation in the medial aspect of the left cerebellar hemisphere adjacent to the fourth ventricle (image 7 of series 201), and in the inferior aspect of the left cerebellar hemisphere (image 4 of series 201), presumably from remote infarctions. No definite acute intracranial abnormalities. Specifically, no evidence of acute intracranial hemorrhage, no definite findings of acute/subacute cerebral ischemia, no mass, mass effect, hydrocephalus or abnormal intra or extra-axial fluid collections. Visualized paranasal sinuses and mastoids are  well pneumatized. No acute displaced skull fractures are identified.  IMPRESSION: 1. No definite acute intracranial abnormalities. 2. Mild cerebral atrophy with chronic microvascular ischemic changes in the cerebral white matter, and old lacunar infarctions in the left cerebellar hemisphere, as above.   Electronically Signed   By: Vinnie Langton M.D.   On: 05/19/2014 20:50   Ct Chest Wo Contrast  05/20/2014   CLINICAL DATA:  Follow up lung nodules.  EXAM: CT CHEST WITHOUT CONTRAST  TECHNIQUE: Multidetector CT imaging of the chest was performed following the standard protocol without IV contrast.  COMPARISON:  Chest CT 10/09/2013  FINDINGS: Multiple tiny nodules are scattered throughout both lungs that are unchanged from prior exam. These all measure less than 3 mm. No new nodules are seen. No dominant mass. Mild hypoventilatory change at the lung bases. No consolidation to suggest pneumonia.  The heart size is normal. Thoracic aorta is normal in caliber. No mediastinal or evidence of hilar adenopathy. No pleural or pericardial effusion. No acute abnormality in the included upper abdomen. There are no acute or suspicious osseous abnormalities.  IMPRESSION: Stable multiple bilateral pulmonary nodules, all measuring less than 3 mm. An additional six-month follow-up could be considered for 1 year assessment.   Electronically Signed   By: Jeb Levering M.D.   On: 05/20/2014 01:04   Mr Brain Wo Contrast  05/20/2014   CLINICAL DATA:  LEFT-sided weakness beginning at noon today, fell 3 times since noon today due to gait imbalance. History of diabetes, hyperlipidemia.  EXAM: MRI HEAD WITHOUT CONTRAST  MRA HEAD WITHOUT CONTRAST  TECHNIQUE: Multiplanar, multiecho pulse sequences of the brain and surrounding structures were obtained without intravenous contrast. Angiographic images of the head were obtained using MRA technique without contrast.  COMPARISON:  CT of the head May 19, 2014 at 2026 hours  FINDINGS: MRI  HEAD FINDINGS  Mildly motion degraded examination. Patchy 1.9 x 1.2 cm area of reduced diffusion in RIGHT frontal convexity involving the pre central gyrus. Corresponding low  ADC values. No susceptibility artifact to suggest hemorrhage.  Ventricles and sulci are normal for patient's age. No midline shift or mass effect. Patchy to confluent supratentorial and pontine white matter T2 hyperintensities. Remote bilateral small inferior cerebellar infarcts. Tiny bilateral basal ganglia prominent perivascular spaces.  No abnormal extra-axial fluid collections. Status post RIGHT ocular lens implant. Paranasal sinuses and mastoid air cells are well aerated. No abnormal sellar expansion. No cerebellar tonsillar ectopia. Bright T1 bone marrow signal most consistent osteopenia.  MRA HEAD FINDINGS  Anterior circulation: Normal flow related enhancement of the included cervical, petrous, cavernous and supra clinoid internal carotid arteries. Patent anterior communicating artery. Normal flow related enhancement of the anterior and middle cerebral arteries, including more distal segments.  No large vessel occlusion, aneurysm. Focal mid to high-grade stenosis of proximal RIGHT M2 branch. Mild luminal irregularity of the mid to distal anterior and middle cerebral arteries.  Posterior circulation: Codominant vertebral arteries. Basilar artery is patent, with normal flow related enhancement of the main branch vessels. Diminutive RIGHT P1 segment with compensatory rib best RIGHT posterior communicating artery. Normal flow related enhancement of the posterior cerebral arteries.  No large vessel occlusion, high-grade stenosis, aneurysm. Mild luminal irregularity of the mid to distal posterior cerebral arteries bilaterally.  IMPRESSION: MRI HEAD: Acute small RIGHT middle cerebral artery territory infarct.  Moderate to severe white matter changes suggest chronic small vessel ischemic disease. Remote bilateral small inferior cerebellar  infarcts.  MRA HEAD: Focal mid to high-grade stenosis of proximal RIGHT M2 branch without large vessel occlusion.  Mild luminal irregularity of the mid to distal cerebral arteries most consistent with atherosclerosis.   Electronically Signed   By: Elon Alas   On: 05/20/2014 01:52   Mr Jodene Nam Head/brain Wo Cm  05/20/2014   CLINICAL DATA:  LEFT-sided weakness beginning at noon today, fell 3 times since noon today due to gait imbalance. History of diabetes, hyperlipidemia.  EXAM: MRI HEAD WITHOUT CONTRAST  MRA HEAD WITHOUT CONTRAST  TECHNIQUE: Multiplanar, multiecho pulse sequences of the brain and surrounding structures were obtained without intravenous contrast. Angiographic images of the head were obtained using MRA technique without contrast.  COMPARISON:  CT of the head May 19, 2014 at 2026 hours  FINDINGS: MRI HEAD FINDINGS  Mildly motion degraded examination. Patchy 1.9 x 1.2 cm area of reduced diffusion in RIGHT frontal convexity involving the pre central gyrus. Corresponding low ADC values. No susceptibility artifact to suggest hemorrhage.  Ventricles and sulci are normal for patient's age. No midline shift or mass effect. Patchy to confluent supratentorial and pontine white matter T2 hyperintensities. Remote bilateral small inferior cerebellar infarcts. Tiny bilateral basal ganglia prominent perivascular spaces.  No abnormal extra-axial fluid collections. Status post RIGHT ocular lens implant. Paranasal sinuses and mastoid air cells are well aerated. No abnormal sellar expansion. No cerebellar tonsillar ectopia. Bright T1 bone marrow signal most consistent osteopenia.  MRA HEAD FINDINGS  Anterior circulation: Normal flow related enhancement of the included cervical, petrous, cavernous and supra clinoid internal carotid arteries. Patent anterior communicating artery. Normal flow related enhancement of the anterior and middle cerebral arteries, including more distal segments.  No large vessel  occlusion, aneurysm. Focal mid to high-grade stenosis of proximal RIGHT M2 branch. Mild luminal irregularity of the mid to distal anterior and middle cerebral arteries.  Posterior circulation: Codominant vertebral arteries. Basilar artery is patent, with normal flow related enhancement of the main branch vessels. Diminutive RIGHT P1 segment with compensatory rib best RIGHT posterior communicating artery. Normal flow  related enhancement of the posterior cerebral arteries.  No large vessel occlusion, high-grade stenosis, aneurysm. Mild luminal irregularity of the mid to distal posterior cerebral arteries bilaterally.  IMPRESSION: MRI HEAD: Acute small RIGHT middle cerebral artery territory infarct.  Moderate to severe white matter changes suggest chronic small vessel ischemic disease. Remote bilateral small inferior cerebellar infarcts.  MRA HEAD: Focal mid to high-grade stenosis of proximal RIGHT M2 branch without large vessel occlusion.  Mild luminal irregularity of the mid to distal cerebral arteries most consistent with atherosclerosis.   Electronically Signed   By: Elon Alas   On: 05/20/2014 01:52    12-lead ECG SR with 1st degree AV block PR 263ms.  No acute changes from previous.  All prior EKG's in EPIC reviewed with no documented atrial fibrillation only S tach  Telemetry SR to SB no atrial fib.  Assessment and Plan:  1. Cryptogenic stroke The patient presents with cryptogenic stroke.  The patient has a TEE planned for this AM.  I spoke at length with the patient about monitoring for afib with either a 30 day event monitor or an implantable loop recorder.  If  Reason for CVA noted prior to [facility] he will not need it to be placed. Risks, benefits, and alteratives to implantable loop recorder were discussed with the patient and wife today.   At this time, the patient is very clear in their decision to proceed with implantable loop recorder.    Please call with questions.  Cecilie Kicks,  FNP-C 05/20/2014 3:10 PM   I have seen, examined the patient, and reviewed the above assessment and plan.  On exam, RRR. Changes to above are made where necessary.   If TEE is unrevealing, will proceed with ILR implant later today.  Co Sign: [name], MD 05/21/2014 10:02 AM

## 2014-05-20 NOTE — Progress Notes (Signed)
    [facility] has been requested to perform a transesophageal echocardiogram on Tony Jackson for stroke work-up.  After careful review of history and examination, the risks and benefits of transesophageal echocardiogram have been explained including risks of esophageal damage, perforation (1:10,000 risk), bleeding, pharyngeal hematoma as well as other potential complications associated with conscious sedation including aspiration, arrhythmia, respiratory failure and death. Alternatives to treatment were discussed, questions were answered. Patient is willing to proceed.   Tarri Fuller, PA-C 05/20/2014 2:35 PM

## 2014-05-20 NOTE — Progress Notes (Signed)
VASCULAR LAB PRELIMINARY  PRELIMINARY  PRELIMINARY  PRELIMINARY  Carotid duplex  completed.    Preliminary report:  Bilateral:  1-39% ICA stenosis.  Vertebral artery flow is antegrade.      Gerrit Rafalski, RVT 05/20/2014, 3:36 PM

## 2014-05-20 NOTE — Progress Notes (Signed)
STROKE TEAM PROGRESS NOTE   HISTORY Tony Jackson. is an 74 y.o. male with a past medical history significant for DM, HTN, hypercholesterolemia, CAD, ED, and nephrolithiasis, brought in due to new onset of left sided weakness. 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 Never had similar symptoms before.Did not get any better and decided to present to the ED for further evaluation. Denies HA, vertigo, double vision, difficulty swallowing, face weakness, slurred speech, language or vision impairment. Had MRI brain that I personally reviewed and showed an acute small infarct involving the right MCA territory. MRA head showed focal mid to high-grade stenosis of proximal RIGHT M2 branch without large vessel occlusion. Patient was not administered TPA secondary to delay in arrival. He was admitted for further evaluation and treatment.   SUBJECTIVE (INTERVAL HISTORY) His wife is at the bedside.  Overall he feels his condition is rapidly improving. He is able to walk in room without assistance. He is not on ASA at home.   OBJECTIVE Temp:  [97.7 F (36.5 C)-99.1 F (37.3 C)] 98.6 F (37 C) (03/30 0928) Pulse Rate:  [56-70] 70 (03/30 0928) Cardiac Rhythm:  [-] Heart block (03/29 2344) Resp:  [13-25] 18 (03/30 0928) BP: (133-178)/(71-99) 145/82 mmHg (03/30 0928) SpO2:  [95 %-99 %] 97 % (03/30 0928) Weight:  [79.379 kg (175 lb)] 79.379 kg (175 lb) (03/29 1934)   Recent Labs Lab 05/19/14 2338 05/20/14 0649  GLUCAP 216* 135*    Recent Labs Lab 05/19/14 1937 05/20/14 0514  NA 142 141  K 4.7 4.2  CL 107 107  CO2 25 31  GLUCOSE 137* 133*  BUN 25* 23  CREATININE 1.34 1.30  CALCIUM 10.1 9.2    Recent Labs Lab 05/20/14 0514  AST 22  ALT 29  ALKPHOS 40  BILITOT 0.8  PROT 5.9*  ALBUMIN 3.7    Recent Labs Lab  05/19/14 1937 05/19/14 1958 05/20/14 0514  WBC 6.8  --  5.8  NEUTROABS  --  4.1  --   HGB 14.4  --  13.6  HCT 42.0  --  40.5  MCV 89.9  --  90.8  PLT 165  --  155   No results for input(s): CKTOTAL, CKMB, CKMBINDEX, TROPONINI in the last 168 hours.  Recent Labs  05/19/14 1958  LABPROT 12.9  INR 0.96    Recent Labs  05/19/14 2011  COLORURINE YELLOW  LABSPEC 1.021  PHURINE 5.5  GLUCOSEU NEGATIVE  HGBUR NEGATIVE  BILIRUBINUR NEGATIVE  KETONESUR NEGATIVE  PROTEINUR 30*  UROBILINOGEN 1.0  NITRITE NEGATIVE  LEUKOCYTESUR NEGATIVE       Component Value Date/Time   CHOL 163 05/20/2014 0514   TRIG 218* 05/20/2014 0514   HDL 41 05/20/2014 0514   CHOLHDL 4.0 05/20/2014 0514   VLDL 44* 05/20/2014 0514   LDLCALC 78 05/20/2014 0514   Lab Results  Component Value Date   HGBA1C 7.6* 10/08/2013      Component Value Date/Time   LABOPIA NONE DETECTED 05/19/2014 2011   COCAINSCRNUR NONE DETECTED 05/19/2014 2011   LABBENZ NONE DETECTED 05/19/2014 2011   AMPHETMU NONE DETECTED 05/19/2014 2011   THCU NONE DETECTED 05/19/2014 2011   LABBARB NONE DETECTED 05/19/2014 2011     Recent Labs Lab 05/19/14 1958  Marlin <5   I have personally reviewed the radiological images below and  agree with the radiology interpretations.  Ct Head Wo Contrast 05/19/2014    1. No definite acute intracranial abnormalities. 2. Mild cerebral atrophy with chronic microvascular ischemic changes in the cerebral white matter, and old lacunar infarctions in the left cerebellar hemisphere.  Ct Chest Wo Contrast 05/20/2014    Stable multiple bilateral pulmonary nodules, all measuring less than 3 mm. An additional six-month follow-up could be considered for 1 year assessment.      MRI HEAD 05/20/2014   Acute small RIGHT middle cerebral artery territory infarct.  Moderate to severe white matter changes suggest chronic small vessel ischemic disease. Remote bilateral small inferior cerebellar infarcts.  By my  read, acute infarct in right ACA territory.  MRA HEAD 05/20/2014   Focal mid to high-grade stenosis of proximal RIGHT M2 branch without large vessel occlusion.  Mild luminal irregularity of the mid to distal cerebral arteries most consistent with atherosclerosis.  Patient has aplastic right A1.  CUS - Bilateral: 1-39% ICA stenosis. Vertebral artery flow is antegrade.  2D echo - pending   PHYSICAL EXAM  Temp:  [97.7 F (36.5 C)-99.1 F (37.3 C)] 97.7 F (36.5 C) (03/30 1324) Pulse Rate:  [56-70] 64 (03/30 1324) Resp:  [13-25] 20 (03/30 1324) BP: (133-178)/(71-99) 151/84 mmHg (03/30 1324) SpO2:  [95 %-100 %] 100 % (03/30 1324) Weight:  [175 lb (79.379 kg)] 175 lb (79.379 kg) (03/29 1934)  General - Well nourished, well developed, in no apparent distress.  Ophthalmologic - Sharp disc margins OU.  Cardiovascular - Regular rate and rhythm with no murmur.  Mental Status -  Level of arousal and orientation to time, place, and person were intact. Language including expression, naming, repetition, comprehension was assessed and found intact. Fund of Knowledge was assessed and was intact.  Cranial Nerves II - XII - II - Visual field intact OU. III, IV, VI - Extraocular movements intact. V - Facial sensation intact bilaterally. VII - Facial movement intact bilaterally. VIII - Hearing & vestibular intact bilaterally. X - Palate elevates symmetrically. XI - Chin turning & shoulder shrug intact bilaterally. XII - Tongue protrusion intact.  Motor Strength - The patient's strength was normal in all extremities and pronator drift was absent.  Bulk was normal and fasciculations were absent.   Motor Tone - Muscle tone was assessed at the neck and appendages and was normal.  Reflexes - The patient's reflexes were 1+ in all extremities and he had no pathological reflexes.  Sensory - Light touch, temperature/pinprick, vibration and proprioception, and Romberg testing were assessed and were  symmetrical.    Coordination - The patient had normal movements in the hands and feet with no ataxia or dysmetria.  Tremor was absent.  Gait and Station - The patient's transfers, posture, gait, station, and turns were observed as slow but steady.    ASSESSMENT/PLAN [name]. is a 74 y.o. male with history of DM, HTN, hypercholesterolemia, CAD, ED, and nephrolithiasis presenting with left sided weakness. He did not receive IV t-PA due to delay in arrival.   Stroke:  Non-dominant right ACA infarct in setting of azygous ACA, infarct felt to be embolic secondary to unknown source   Resultant  Left hemiparesis resolved but decreased stamina  MRI  R ACA infarct  MRA  High grade stenosis proximal R M2 branch, azygous ACA (fills from left)  Carotid Doppler  unremarkable   2D Echo  pending  TEE to look for embolic source. Arranged with Rodessa  for tomorrow.  If positive for PFO (patent foramen ovale), check bilateral lower extremity venous dopplers to rule out DVT as possible source of stroke. (I have made patient NPO after midnight tonight).   If TEE negative, a [place] electrophysiologist will consult and consider placement of an implantable loop recorder to evaluate for atrial fibrillation as etiology of stroke. This has been explained to patient/family by Dr. Erlinda Hong and they are agreeable.   SCDs for VTE prophylaxis  Diet heart healthy/carb modified Room service appropriate?: Yes; Fluid consistency:: Thin  no antithrombotic prior to admission, now on aspirin 325 mg orally every day  Wife and Patient counseled to be compliant with his antithrombotic medications  Ongoing aggressive stroke risk factor management  Therapy recommendations:  pending   Disposition:  pending   Hypertension  Home meds:   lisinopril  Stable  Permissive hypertension (OK if < 220/120) but gradually normalize in 5-7 days    Hyperlipidemia  Home meds:  zocor 40, resumed in hospital  LDL 78, goal < 70  Continue statin at discharge  Diabetes, type II  HgbA1c pending, goal < 7.0  Home meds - metformin, glipizide  Home as resumed  SSI  Other Stroke Risk Factors  Advanced age  Cigarette smoker, quit smoking 50 years ago   Other Pertinent History  Known lung nodule  Hospital day # [place] for Pager information 05/20/2014 10:07 AM   I, the attending vascular neurologist, have personally obtained a history, examined the patient, evaluated laboratory data, individually viewed imaging studies and agree with radiology interpretations. I also obtained additional history from pt's wife at bedside. I also discussed with Dr. Lunette Stands regarding his care plan. Together with the NP/PA, we formulated the assessment and plan of care which reflects our mutual decision.  I have made any additions or clarifications directly to the above note and agree with the findings and plan as currently documented.   74 year old male with history of hypertension, DM, HLD, CAD not on aspirin was admitted for right ACA infarct. MRA also showed right M2 high-grade stenosis. Concerning for embolic stroke. Carotid Doppler negative, will do TEE and group recorder. Continue aspirin and statin. Waiting for 2-D echo and A1c.  Rosalin Hawking, MD PhD Stroke Neurology 05/20/2014 4:59 PM       To contact Stroke Continuity provider, please refer to [email or web address] After hours, contact General Neurology

## 2014-05-20 NOTE — Progress Notes (Signed)
CARE MANAGEMENT NOTE 05/20/2014  Patient:  Tony Jackson, Tony Jackson   Account Number:  [id number]  Date Initiated:  05/20/2014  Documentation initiated by:  Carles Collet  Subjective/Objective Assessment:   +cva, prior to admission lived at home with family     Action/Plan:   will continue to follow for discharge needs   Anticipated DC Date:  05/22/2014   Anticipated DC Plan:  HOME/SELF CARE         Choice offered to / List presented to:             Status of service:  In process, will continue to follow Medicare Important Message given?   (If response is "NO", the following Medicare IM given date fields will be blank) Date Medicare IM given:   Medicare IM given by:   Date Additional Medicare IM given:   Additional Medicare IM given by:    Discharge Disposition:    Per UR Regulation:    If discussed at Long Length of Stay Meetings, dates discussed:    Comments:

## 2014-05-20 NOTE — Progress Notes (Signed)
UR complete.  Jamaris Theard RN, MSN 

## 2014-05-21 ENCOUNTER — Encounter (HOSPITAL_COMMUNITY)
Admission: EM | Disposition: A | Discharge status: Home / Self Care | Payer: Self-pay | Source: Home / Self Care | Attending: Internal Medicine

## 2014-05-21 ENCOUNTER — Encounter (HOSPITAL_COMMUNITY): Payer: Self-pay | Admitting: *Deleted

## 2014-05-21 DIAGNOSIS — I2510 Atherosclerotic heart disease of native coronary artery without angina pectoris: Secondary | ICD-10-CM

## 2014-05-21 DIAGNOSIS — I63429 Cerebral infarction due to embolism of unspecified anterior cerebral artery: Secondary | ICD-10-CM

## 2014-05-21 DIAGNOSIS — I639 Cerebral infarction, unspecified: Secondary | ICD-10-CM

## 2014-05-21 DIAGNOSIS — E1159 Type 2 diabetes mellitus with other circulatory complications: Secondary | ICD-10-CM

## 2014-05-21 HISTORY — PX: TEE WITHOUT CARDIOVERSION: SHX5443

## 2014-05-21 LAB — GLUCOSE, CAPILLARY
Glucose-Capillary: 143 mg/dL — ABNORMAL HIGH (ref 70–99)
Glucose-Capillary: 125 mg/dL — ABNORMAL HIGH (ref 70–99)
Glucose-Capillary: 209 mg/dL — ABNORMAL HIGH (ref 70–99)

## 2014-05-21 LAB — HEMOGLOBIN A1C
Hgb A1c MFr Bld: 7.3 % — ABNORMAL HIGH (ref 4.8–5.6)
MEAN PLASMA GLUCOSE: 163 mg/dL

## 2014-05-21 LAB — ANTINUCLEAR ANTIBODIES, IFA: ANTINUCLEAR ANTIBODIES, IFA: NEGATIVE

## 2014-05-21 SURGERY — ECHOCARDIOGRAM, TRANSESOPHAGEAL
Anesthesia: Moderate Sedation

## 2014-05-21 SURGERY — LOOP RECORDER IMPLANT
Anesthesia: LOCAL

## 2014-05-21 MED ORDER — BUTAMBEN-TETRACAINE-BENZOCAINE 2-2-14 % EX AERO
INHALATION_SPRAY | CUTANEOUS | Status: DC | PRN
  Administered 2014-05-21: 2 via TOPICAL

## 2014-05-21 MED ORDER — SODIUM CHLORIDE 0.9 % IV SOLN
INTRAVENOUS | Status: DC
  Administered 2014-05-21: 500 mL via INTRAVENOUS

## 2014-05-21 MED ORDER — MIDAZOLAM HCL 10 MG/2ML IJ SOLN
INTRAMUSCULAR | Status: DC | PRN
  Administered 2014-05-21: 2 mg via INTRAVENOUS

## 2014-05-21 MED ORDER — LIDOCAINE-EPINEPHRINE 1 %-1:100000 IJ SOLN
INTRAMUSCULAR | Status: AC
  Filled 2014-05-21: qty 1

## 2014-05-21 MED ORDER — ASPIRIN EC 325 MG PO TBEC: 325.0000 mg | DELAYED_RELEASE_TABLET | Freq: Every day | ORAL | Status: DC

## 2014-05-21 MED ORDER — MIDAZOLAM HCL 5 MG/ML IJ SOLN
INTRAMUSCULAR | Status: AC
  Filled 2014-05-21: qty 2

## 2014-05-21 MED ORDER — FENTANYL CITRATE 0.05 MG/ML IJ SOLN
INTRAMUSCULAR | Status: AC
  Filled 2014-05-21: qty 2

## 2014-05-21 MED ORDER — FENTANYL CITRATE 0.05 MG/ML IJ SOLN
INTRAMUSCULAR | Status: DC | PRN
  Administered 2014-05-21: 25 ug via INTRAVENOUS

## 2014-05-21 MED ORDER — DIPHENHYDRAMINE HCL 50 MG/ML IJ SOLN
INTRAMUSCULAR | Status: AC
Start: 2014-05-21 — End: 2014-05-21
  Filled 2014-05-21: qty 1

## 2014-05-21 NOTE — Evaluation (Signed)
Occupational Therapy Evaluation Patient Details Name: Tony Jackson. MRN: [id number] DOB: June 10, 1940 Today's Date: 05/21/2014    History of Present Illness 74 yo male with DM2, htn, hyperlipidemia, CAD, c/o left sided weakness, starting at about noon on 05/19/14. Pt also noted that he was stumbling and fell x3 prior to coming to the hospital. Denies numbness, tingling, dysarthria, vision change or dysphagia. Pt presented to the ED for evaluation and CT brain negative for acute process on 05/19/14, but MRA on 05/20/14 indicated acute small right MCA territory infarct.   Clinical Impression   Pt admitted with the above diagnoses and presents with below problem list. Pt will benefit from continued OT to address the below listed deficits and maximize independence with BADLs prior to d/c home. PTA pt was independent with ADLs and worked in [employer]. Currently pt is at min guard level for LB ADLs due to balance and supervision for functional mobility. Some mild Lt side inattention noted - see bed mobility notes below. HEP for LUE strengthening and fine motor coordination provided. Home setup discussed with pt and spouse.      Follow Up Recommendations  Outpatient OT;Supervision - Intermittent    Equipment Recommendations  None recommended by OT;Other (comment) (Discussed using equipment already available to pt)    Recommendations for Other Services       Precautions / Restrictions Precautions Precautions: Fall Precaution Comments: Fall risk greatly reduced with UE support; some Lt side neglect noted      Mobility Bed Mobility Overal bed mobility: Needs Assistance Bed Mobility: Supine to Sit     Supine to sit: Supervision     General bed mobility comments: cued to bring LUE/LLE fully to EOB with pt noted to have LUE behind him and LLE off the floor after completing supine>EOB. No physical assist needed.  Transfers Overall transfer level: Needs assistance Equipment used:  None Transfers: Sit to/from Stand Sit to Stand: Supervision         General transfer comment: S for safety    Balance Overall balance assessment: Needs assistance Sitting-balance support: No upper extremity supported;Feet supported Sitting balance-Leahy Scale: Good     Standing balance support: No upper extremity supported;During functional activity Standing balance-Leahy Scale: Good Standing balance comment: no LOB during dynamic standing activities in vertical and horizontal planes                            ADL Overall ADL's : Needs assistance/impaired Eating/Feeding: Set up;Sitting   Grooming: Set up;Standing   Upper Body Bathing: Set up;Sitting   Lower Body Bathing: Min guard;Sit to/from stand   Upper Body Dressing : Set up;Sitting   Lower Body Dressing: Min guard;Sit to/from stand   Toilet Transfer: Supervision/safety;Ambulation;Regular Toilet;Grab bars   Toileting- Clothing Manipulation and Hygiene: Min guard;Sit to/from stand   Tub/ Shower Transfer: Supervision/safety;Ambulation;Shower seat   Functional mobility during ADLs: Supervision/safety General ADL Comments: Cues to incorporate LUE into bilateral tasks more though pt does do this some with no cueing provided. Min guard for LB ADLs due to balance. Discussed home setup safety with pt and spouse.      Vision     Perception Perception Perception Tested?: Yes Perception Deficits: Inattention/neglect Inattention/Neglect:  (mild Lt neglect observed)   Praxis      Pertinent Vitals/Pain Pain Assessment: No/denies pain     Hand Dominance Right   Extremity/Trunk Assessment Upper Extremity Assessment Upper Extremity Assessment: LUE deficits/detail LUE Deficits /  Details: grossly 4/5. weaker proximal, stronger distal. Noted to neglect LUE and LLE during supine>EOB. Did use Lt hand to assist some with bathing at sink without cueing.  LUE Coordination: decreased fine motor;decreased gross  motor   Lower Extremity Assessment Lower Extremity Assessment: Defer to PT evaluation       Communication Communication Communication: No difficulties   Cognition Arousal/Alertness: Awake/alert Behavior During Therapy: WFL for tasks assessed/performed Overall Cognitive Status: Impaired/Different from baseline Area of Impairment: Memory     Memory: Decreased short-term memory         General Comments: Missed directional cues provided during ambulation. When asked to pick up his hat from the closet he covered his hat with a bag.    General Comments       Exercises Exercises: General Upper Extremity     Shoulder Instructions      Home Living Family/patient expects to be discharged to:: Private residence Living Arrangements: Spouse/significant other Available Help at Discharge: Family;Friend(s) Type of Home: House Home Access: Stairs to enter [employer] of Steps: 2   Home Layout: Two level;Bed/bath upstairs Alternate Level Stairs-Number of Steps: 12 Alternate Level Stairs-Rails: Right Bathroom Shower/Tub: Teacher, early years/pre: Standard     Home Equipment: None   Additional Comments: Pt's wife stated she may have a shower seat. Discussed using sink beside toilet to assist with balance during toilet transfers. Discussed installing grab bars in shower and using a plastic chair to sit to bathe LB.  Lives With: Spouse    Prior Functioning/Environment Level of Independence: Independent        Comments: Works in Social research officer, government    OT Diagnosis: Paresis;Other (comment) (Lt side)   OT Problem List: Decreased strength;Impaired balance (sitting and/or standing);Decreased coordination;Impaired vision/perception;Decreased cognition;Decreased safety awareness;Decreased knowledge of precautions;Decreased knowledge of use of DME or AE;Impaired UE functional use   OT Treatment/Interventions: Self-care/ADL training;Therapeutic  exercise;Neuromuscular education;DME and/or AE instruction;Therapeutic activities;Cognitive remediation/compensation;Visual/perceptual remediation/compensation;Patient/family education;Balance training    OT Goals(Current goals can be found in the care plan section) Acute Rehab OT Goals Patient Stated Goal: home OT Goal Formulation: With patient/family Time For Goal Achievement: 05/28/14 Potential to Achieve Goals: Good ADL Goals Pt Will Perform Tub/Shower Transfer: with modified independence;ambulating;shower seat Pt/caregiver will Perform Home Exercise Program: Increased strength;Left upper extremity;Independently;With written HEP provided;With theraband  OT Frequency: Min 2X/week   Barriers to D/C:            Co-evaluation              End of Session    Activity Tolerance: Patient tolerated treatment well Patient left: in chair   Time: 0865-7846 OT Time Calculation (min): 34 min Charges:  OT General Charges $OT Visit: 1 Procedure OT Evaluation $Initial OT Evaluation Tier I: 1 Procedure OT Treatments $Therapeutic Activity: 8-22 mins G-Codes:    Hortencia Pilar 06/16/14, 10:10 AM

## 2014-05-21 NOTE — Interval H&P Note (Signed)
History and Physical Interval Note:  05/21/2014 10:03 AM  Tony Jackson.  has presented today for surgery, with the diagnosis of stroke  The various methods of treatment have been discussed with the patient and family. After consideration of risks, benefits and other options for treatment, the patient has consented to  Procedure(s): LOOP RECORDER IMPLANT (N/A) as a surgical intervention .  The patient's history has been reviewed, patient examined, no change in status, stable for surgery.  I have reviewed the patient's chart and labs.  Questions were answered to the patient's satisfaction.     Tony Jackson

## 2014-05-21 NOTE — CV Procedure (Signed)
SURGEON:  [name], MD     PREPROCEDURE DIAGNOSIS:  Cryptogenic Stroke    POSTPROCEDURE DIAGNOSIS:  Cryptogenic Stroke     PROCEDURES:   1. Implantable loop recorder implantation    INTRODUCTION:  Tony Jackson. is a 74 y.o. male with a history of unexplained stroke who presents today for implantable loop implantation.  The patient has had a cryptogenic stroke.  Despite an extensive workup by neurology, no reversible causes have been identified.  he has worn telemetry during which he did not have arrhythmias.  There is significant concern for possible atrial fibrillation as the cause for the patients stroke.  The patient therefore presents today for implantable loop implantation.     DESCRIPTION OF PROCEDURE:  Informed written consent was obtained, and the patient was brought to the electrophysiology lab in a fasting state.  The patient required no sedation for the procedure today.  Mapping over the patient's chest was performed by the EP lab staff to identify the area where electrograms were most prominent for ILR recording.  This area was found to be the left parasternal region over the 3rd-4th intercostal space. The patients left chest was therefore prepped and draped in the usual sterile fashion by the EP lab staff. The skin overlying the left parasternal region was infiltrated with lidocaine for local analgesia.  A 0.5-cm incision was made over the left parasternal region over the 3rd intercostal space.  A subcutaneous ILR pocket was fashioned using a combination of sharp and blunt dissection.  A Medtronic Reveal [place] model [id number] implantable loop recorder was then placed into the pocket  R waves were very prominent and measured 0.35mV. EBL<1 ml.  Steri- Strips and a sterile dressing were then applied.  There were no early apparent complications.     CONCLUSIONS:   1. Successful implantation of a Medtronic Reveal LINQ implantable loop recorder for cryptogenic stroke  2. No  early apparent complications.   [name] MD, [facility] 05/21/2014 2:39 PM

## 2014-05-21 NOTE — Discharge Summary (Signed)
Physician Discharge Summary  Tony Jackson. QPY:[id number] DOB: 1940-06-08 DOA: 05/19/2014  PCP: Viviana Simpler, MD  Admit date: 05/19/2014 Discharge date: 05/21/2014  Time spent: 35 minutes  Recommendations for Outpatient Follow-up:  1. Follow-up with PCP in one week   Discharge Diagnoses:  Active Problems:   Diabetes   CAD (coronary artery disease)   Stroke   TIA (transient ischemic attack)   Lung nodules   Essential hypertension   Hyperlipidemia   Cerebral infarction due to embolism of anterior cerebral artery   Discharge Condition: Stable  Diet recommendation: Cardiac  Filed Weights   05/19/14 1934  Weight: 79.379 kg (175 lb)    History of present illness:  Tony Jackson. is an 74 y.o. male with a past medical history significant for DM, HTN, hypercholesterolemia, CAD, ED, and nephrolithiasis, brought in due to new onset of left sided weakness. 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  Hospital Course:  1. Cerebrovascular accident -MRI of brain showed acute small right MCA infarct. Seen by neurology felt that this was cryptogenic stroke. TEE was negative for any PFO or left ventricular thrombus. Patient had loop recorder placed by electrophysiology as recommended by the neurologist..Carotid Dopplers are negative for any significant stenosis. Evaluated by PT OT and was able to ambulate. Considering patient's mild weakness on the left side arranged outpatient rehabilitation.Continue with the Zocor.-  2. Diabetes mellitus -On glipizide and metformin -Hemoglobin A1c 7.3 -Hold metformin while in the hospital -Continue to monitor on sliding scale insulin -Started back on metformin at the time of the discharge.  3. Hypertension -Continue with lisinopril   Procedures:  MRI of the brain  Carotid  Dopplers  Echocardiogram  Transesophageal echocardiogram  Loop recorder placement  Consultations:  Neurology  Discharge Exam: Filed Vitals:   05/21/14 1505  BP: 142/79  Pulse: 60  Temp: 98.3 F (36.8 C)  Resp: 18      Discharge Instructions    Ambulatory referral to Neurology    Complete by:  As directed   Pt will follow up with Dr. Erlinda Hong at [facility] in about 1 month. Thanks.          Discharge Medication List as of 05/21/2014  4:17 PM    START taking these medications   Details  aspirin EC 325 MG tablet Take 1 tablet (325 mg total) by mouth daily., Starting 05/21/2014, Until Discontinued, Print      CONTINUE these medications which have NOT CHANGED   Details  GLIPIZIDE XL 5 MG 24 hr tablet TAKE 1 TABLET DAILY, Normal    ketoconazole (NIZORAL) 2 % shampoo Apply 1 application topically 2 (two) times a week., Starting 07/25/2013, Until Discontinued, Normal    lisinopril (PRINIVIL,ZESTRIL) 10 MG tablet Take 1 tablet (10 mg total) by mouth daily., Starting 03/19/2013, Until Discontinued, Normal    metFORMIN (GLUCOPHAGE) 1000 MG tablet Take 1 tablet (1,000 mg total) by mouth 2 (two) times daily with a meal., Starting 03/19/2013, Until Discontinued, Normal    mometasone (ELOCON) 0.1 % cream APPLY TO THE AFFECTED AREA TWO TIMES A DAY AS NEEDED, Normal    sildenafil (VIAGRA) 100 MG tablet Take 50-100 mg by mouth daily as needed. , Until Discontinued, Historical Med    simvastatin (ZOCOR) 40 MG tablet Take 1 tablet (40 mg total) by mouth at bedtime., Starting 03/19/2013,  Until Discontinued, Normal    triamcinolone cream (KENALOG) 0.1 % Apply 1 application topically 2 (two) times daily as needed., Starting 07/25/2013, Until Discontinued, Normal       No Known Allergies Follow-up Information    Follow up with [facility] On 06/01/2014.   Specialty:  Cardiology   Why:   at 10:00 am for Loop recorder site check.     Contact information:   [address],  [place] [phone]      Follow up with Viviana Simpler, MD. Schedule an appointment as soon as possible for a visit in 1 week.   Specialties:  Internal Medicine, Pediatrics   Why:  For hospital follow up   Contact information:   [place] [address] [phone]       Follow up with Xu,Jindong, MD. Schedule an appointment as soon as possible for a visit in 1 month.   Specialty:  Neurology   Why:  stroke clinic   Contact information:   [address] [place] [address] [phone]        The results of significant diagnostics from this hospitalization (including imaging, microbiology, ancillary and laboratory) are listed below for reference.    Significant Diagnostic Studies: Ct Head Wo Contrast  05/19/2014   CLINICAL DATA:  74 year old male with left-sided weakness since noon today. Poor balance and multiple falls since noon.  EXAM: CT HEAD WITHOUT CONTRAST  TECHNIQUE: Contiguous axial images were obtained from the base of the skull through the vertex without intravenous contrast.  COMPARISON:  No priors.  FINDINGS: Mild cerebral atrophy. Patchy and confluent areas of decreased attenuation are noted throughout the deep and periventricular white matter of the cerebral hemispheres bilaterally, compatible with chronic microvascular ischemic disease. Relatively well-defined foci of low attenuation in the medial aspect of the left cerebellar hemisphere adjacent to the fourth ventricle (image 7 of series 201), and in the inferior aspect of the left cerebellar hemisphere (image 4 of series 201), presumably from remote infarctions. No definite acute intracranial abnormalities. Specifically, no evidence of acute intracranial hemorrhage, no definite findings of acute/subacute cerebral ischemia, no mass, mass effect, hydrocephalus or abnormal intra or extra-axial fluid collections. Visualized paranasal sinuses and mastoids  are well pneumatized. No acute displaced skull fractures are identified.  IMPRESSION: 1. No definite acute intracranial abnormalities. 2. Mild cerebral atrophy with chronic microvascular ischemic changes in the cerebral white matter, and old lacunar infarctions in the left cerebellar hemisphere, as above.   Electronically Signed   By: Vinnie Langton M.D.   On: 05/19/2014 20:50   Ct Chest Wo Contrast  05/20/2014   CLINICAL DATA:  Follow up lung nodules.  EXAM: CT CHEST WITHOUT CONTRAST  TECHNIQUE: Multidetector CT imaging of the chest was performed following the standard protocol without IV contrast.  COMPARISON:  Chest CT 10/09/2013  FINDINGS: Multiple tiny nodules are scattered throughout both lungs that are unchanged from prior exam. These all measure less than 3 mm. No new nodules are seen. No dominant mass. Mild hypoventilatory change at the lung bases. No consolidation to suggest pneumonia.  The heart size is normal. Thoracic aorta is normal in caliber. No mediastinal or evidence of hilar adenopathy. No pleural or pericardial effusion. No acute abnormality in the included upper abdomen. There are no acute or suspicious osseous abnormalities.  IMPRESSION: Stable multiple bilateral pulmonary nodules, all measuring less than 3 mm. An additional six-month follow-up could be  considered for 1 year assessment.   Electronically Signed   By: Jeb Levering M.D.   On: 05/20/2014 01:04   Mr Brain Wo Contrast  05/20/2014   CLINICAL DATA:  LEFT-sided weakness beginning at noon today, fell 3 times since noon today due to gait imbalance. History of diabetes, hyperlipidemia.  EXAM: MRI HEAD WITHOUT CONTRAST  MRA HEAD WITHOUT CONTRAST  TECHNIQUE: Multiplanar, multiecho pulse sequences of the brain and surrounding structures were obtained without intravenous contrast. Angiographic images of the head were obtained using MRA technique without contrast.  COMPARISON:  CT of the head May 19, 2014 at 2026 hours  FINDINGS:  MRI HEAD FINDINGS  Mildly motion degraded examination. Patchy 1.9 x 1.2 cm area of reduced diffusion in RIGHT frontal convexity involving the pre central gyrus. Corresponding low ADC values. No susceptibility artifact to suggest hemorrhage.  Ventricles and sulci are normal for patient's age. No midline shift or mass effect. Patchy to confluent supratentorial and pontine white matter T2 hyperintensities. Remote bilateral small inferior cerebellar infarcts. Tiny bilateral basal ganglia prominent perivascular spaces.  No abnormal extra-axial fluid collections. Status post RIGHT ocular lens implant. Paranasal sinuses and mastoid air cells are well aerated. No abnormal sellar expansion. No cerebellar tonsillar ectopia. Bright T1 bone marrow signal most consistent osteopenia.  MRA HEAD FINDINGS  Anterior circulation: Normal flow related enhancement of the included cervical, petrous, cavernous and supra clinoid internal carotid arteries. Patent anterior communicating artery. Normal flow related enhancement of the anterior and middle cerebral arteries, including more distal segments.  No large vessel occlusion, aneurysm. Focal mid to high-grade stenosis of proximal RIGHT M2 branch. Mild luminal irregularity of the mid to distal anterior and middle cerebral arteries.  Posterior circulation: Codominant vertebral arteries. Basilar artery is patent, with normal flow related enhancement of the main branch vessels. Diminutive RIGHT P1 segment with compensatory rib best RIGHT posterior communicating artery. Normal flow related enhancement of the posterior cerebral arteries.  No large vessel occlusion, high-grade stenosis, aneurysm. Mild luminal irregularity of the mid to distal posterior cerebral arteries bilaterally.  IMPRESSION: MRI HEAD: Acute small RIGHT middle cerebral artery territory infarct.  Moderate to severe white matter changes suggest chronic small vessel ischemic disease. Remote bilateral small inferior cerebellar  infarcts.  MRA HEAD: Focal mid to high-grade stenosis of proximal RIGHT M2 branch without large vessel occlusion.  Mild luminal irregularity of the mid to distal cerebral arteries most consistent with atherosclerosis.   Electronically Signed   By: Elon Alas   On: 05/20/2014 01:52   Mr Jodene Nam Head/brain Wo Cm  05/20/2014   CLINICAL DATA:  LEFT-sided weakness beginning at noon today, fell 3 times since noon today due to gait imbalance. History of diabetes, hyperlipidemia.  EXAM: MRI HEAD WITHOUT CONTRAST  MRA HEAD WITHOUT CONTRAST  TECHNIQUE: Multiplanar, multiecho pulse sequences of the brain and surrounding structures were obtained without intravenous contrast. Angiographic images of the head were obtained using MRA technique without contrast.  COMPARISON:  CT of the head May 19, 2014 at 2026 hours  FINDINGS: MRI HEAD FINDINGS  Mildly motion degraded examination. Patchy 1.9 x 1.2 cm area of reduced diffusion in RIGHT frontal convexity involving the pre central gyrus. Corresponding low ADC values. No susceptibility artifact to suggest hemorrhage.  Ventricles and sulci are normal for patient's age. No midline shift or mass effect. Patchy to confluent supratentorial and pontine white matter T2 hyperintensities. Remote bilateral small inferior cerebellar infarcts. Tiny bilateral basal ganglia prominent perivascular spaces.  No abnormal extra-axial fluid  collections. Status post RIGHT ocular lens implant. Paranasal sinuses and mastoid air cells are well aerated. No abnormal sellar expansion. No cerebellar tonsillar ectopia. Bright T1 bone marrow signal most consistent osteopenia.  MRA HEAD FINDINGS  Anterior circulation: Normal flow related enhancement of the included cervical, petrous, cavernous and supra clinoid internal carotid arteries. Patent anterior communicating artery. Normal flow related enhancement of the anterior and middle cerebral arteries, including more distal segments.  No large vessel  occlusion, aneurysm. Focal mid to high-grade stenosis of proximal RIGHT M2 branch. Mild luminal irregularity of the mid to distal anterior and middle cerebral arteries.  Posterior circulation: Codominant vertebral arteries. Basilar artery is patent, with normal flow related enhancement of the main branch vessels. Diminutive RIGHT P1 segment with compensatory rib best RIGHT posterior communicating artery. Normal flow related enhancement of the posterior cerebral arteries.  No large vessel occlusion, high-grade stenosis, aneurysm. Mild luminal irregularity of the mid to distal posterior cerebral arteries bilaterally.  IMPRESSION: MRI HEAD: Acute small RIGHT middle cerebral artery territory infarct.  Moderate to severe white matter changes suggest chronic small vessel ischemic disease. Remote bilateral small inferior cerebellar infarcts.  MRA HEAD: Focal mid to high-grade stenosis of proximal RIGHT M2 branch without large vessel occlusion.  Mild luminal irregularity of the mid to distal cerebral arteries most consistent with atherosclerosis.   Electronically Signed   By: Elon Alas   On: 05/20/2014 01:52    Microbiology: No results found for this or any previous visit (from the past 240 hour(s)).   Labs: Basic Metabolic Panel:  Recent Labs Lab 05/19/14 1937 05/20/14 0514  NA 142 141  K 4.7 4.2  CL 107 107  CO2 25 31  GLUCOSE 137* 133*  BUN 25* 23  CREATININE 1.34 1.30  CALCIUM 10.1 9.2   Liver Function Tests:  Recent Labs Lab 05/20/14 0514  AST 22  ALT 29  ALKPHOS 40  BILITOT 0.8  PROT 5.9*  ALBUMIN 3.7   No results for input(s): LIPASE, AMYLASE in the last 168 hours. No results for input(s): AMMONIA in the last 168 hours. CBC:  Recent Labs Lab 05/19/14 1937 05/19/14 1958 05/20/14 0514  WBC 6.8  --  5.8  NEUTROABS  --  4.1  --   HGB 14.4  --  13.6  HCT 42.0  --  40.5  MCV 89.9  --  90.8  PLT 165  --  155   Cardiac Enzymes: No results for input(s): CKTOTAL, CKMB,  CKMBINDEX, TROPONINI in the last 168 hours. BNP: BNP (last 3 results) No results for input(s): BNP in the last 8760 hours.  ProBNP (last 3 results) No results for input(s): PROBNP in the last 8760 hours.  CBG:  Recent Labs Lab 05/20/14 1638 05/20/14 2158 05/21/14 0637 05/21/14 1312 05/21/14 1613  GLUCAP 102* 87 143* 125* 209*       Signed:  Gerre Ranum  [facility] 05/21/2014, 6:51 PM

## 2014-05-21 NOTE — Progress Notes (Signed)
STROKE TEAM PROGRESS NOTE   HISTORY Tony Dillenbeck. is an 74 y.o. male with a past medical history significant for DM, HTN, hypercholesterolemia, CAD, ED, and nephrolithiasis, brought in due to new onset of left sided weakness. 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 Never had similar symptoms before.Did not get any better and decided to present to the ED for further evaluation. Denies HA, vertigo, double vision, difficulty swallowing, face weakness, slurred speech, language or vision impairment. Had MRI brain that I personally reviewed and showed an acute small infarct involving the right MCA territory. MRA head showed focal mid to high-grade stenosis of proximal RIGHT M2 branch without large vessel occlusion. Patient was not administered TPA secondary to delay in arrival. He was admitted for further evaluation and treatment.   SUBJECTIVE (INTERVAL HISTORY) His wife is at the bedside.  Overall he feels his condition is rapidly improving. He is able to walk in room without assistance. He is not on ASA at home.   OBJECTIVE Temp:  [97.3 F (36.3 C)-98.8 F (37.1 C)] 98.3 F (36.8 C) (03/31 1505) Pulse Rate:  [57-81] 60 (03/31 1505) Cardiac Rhythm:  [-] Heart block (03/30 2000) Resp:  [17-31] 18 (03/31 1505) BP: (125-172)/(64-96) 142/79 mmHg (03/31 1505) SpO2:  [95 %-99 %] 98 % (03/31 1505)   Recent Labs Lab 05/20/14 1131 05/20/14 1638 05/20/14 2158 05/21/14 0637 05/21/14 1312  GLUCAP 219* 102* 87 143* 125*    Recent Labs Lab 05/19/14 1937 05/20/14 0514  NA 142 141  K 4.7 4.2  CL 107 107  CO2 25 31  GLUCOSE 137* 133*  BUN 25* 23  CREATININE 1.34 1.30  CALCIUM 10.1 9.2    Recent Labs Lab 05/20/14 0514  AST 22  ALT 29  ALKPHOS 40  BILITOT 0.8  PROT 5.9*  ALBUMIN 3.7    Recent Labs Lab  05/19/14 1937 05/19/14 1958 05/20/14 0514  WBC 6.8  --  5.8  NEUTROABS  --  4.1  --   HGB 14.4  --  13.6  HCT 42.0  --  40.5  MCV 89.9  --  90.8  PLT 165  --  155   No results for input(s): CKTOTAL, CKMB, CKMBINDEX, TROPONINI in the last 168 hours.  Recent Labs  05/19/14 1958  LABPROT 12.9  INR 0.96    Recent Labs  05/19/14 2011  COLORURINE YELLOW  LABSPEC 1.021  PHURINE 5.5  GLUCOSEU NEGATIVE  HGBUR NEGATIVE  BILIRUBINUR NEGATIVE  KETONESUR NEGATIVE  PROTEINUR 30*  UROBILINOGEN 1.0  NITRITE NEGATIVE  LEUKOCYTESUR NEGATIVE       Component Value Date/Time   CHOL 168 05/20/2014 1310   TRIG 279* 05/20/2014 1310   HDL 41 05/20/2014 1310   CHOLHDL 4.1 05/20/2014 1310   VLDL 56* 05/20/2014 1310   LDLCALC 71 05/20/2014 1310   Lab Results  Component Value Date   HGBA1C 7.3* 05/20/2014      Component Value Date/Time   LABOPIA NONE DETECTED 05/19/2014 2011   COCAINSCRNUR NONE DETECTED 05/19/2014 2011   LABBENZ NONE DETECTED 05/19/2014 2011   AMPHETMU NONE DETECTED 05/19/2014 2011   THCU NONE DETECTED 05/19/2014 2011   LABBARB NONE DETECTED 05/19/2014 2011     Recent Labs Lab 05/19/14 1958  Blairs <5   I have personally reviewed the radiological images below and agree with the  radiology interpretations.  Ct Head Wo Contrast 05/19/2014    1. No definite acute intracranial abnormalities. 2. Mild cerebral atrophy with chronic microvascular ischemic changes in the cerebral white matter, and old lacunar infarctions in the left cerebellar hemisphere.  Ct Chest Wo Contrast 05/20/2014    Stable multiple bilateral pulmonary nodules, all measuring less than 3 mm. An additional six-month follow-up could be considered for 1 year assessment.      MRI HEAD 05/20/2014   Acute small RIGHT middle cerebral artery territory infarct.  Moderate to severe white matter changes suggest chronic small vessel ischemic disease. Remote bilateral small inferior cerebellar infarcts.  By my  read, acute infarct in right ACA territory.  MRA HEAD 05/20/2014   Focal mid to high-grade stenosis of proximal RIGHT M2 branch without large vessel occlusion.  Mild luminal irregularity of the mid to distal cerebral arteries most consistent with atherosclerosis.  Patient has aplastic right A1.  CUS - Bilateral: 1-39% ICA stenosis. Vertebral artery flow is antegrade.  2D echo - - Normal LV size and systolic function, EF 74-25%. Normal RV size and systolic function. No significant valvular abnormalities.  TEE - - Left ventricle: Systolic function was normal. The estimated ejection fraction was in the range of 55% to 60%. - Aortic valve: No evidence of vegetation. There was trivial regurgitation. - Mitral valve: No evidence of vegetation. - Left atrium: No evidence of thrombus in the appendage. - Right atrium: No evidence of thrombus in the atrial cavity or appendage. - Atrial septum: No defect or patent foramen ovale was identified. Echo contrast study showed no right-to-left atrial level shunt, following an increase in RA pressure induced by provocative maneuvers. - Tricuspid valve: No evidence of vegetation. - Pulmonic valve: No evidence of vegetation. - Superior vena cava: The study excluded a thrombus.   PHYSICAL EXAM  Temp:  [97.3 F (36.3 C)-98.8 F (37.1 C)] 98.3 F (36.8 C) (03/31 1505) Pulse Rate:  [57-81] 60 (03/31 1505) Resp:  [17-31] 18 (03/31 1505) BP: (125-172)/(64-96) 142/79 mmHg (03/31 1505) SpO2:  [95 %-99 %] 98 % (03/31 1505)  General - Well nourished, well developed, in no apparent distress.  Ophthalmologic - Sharp disc margins OU.  Cardiovascular - Regular rate and rhythm with no murmur.  Mental Status -  Level of arousal and orientation to time, place, and person were intact. Language including expression, naming, repetition, comprehension was assessed and found intact. Fund of Knowledge was assessed and was intact.  Cranial Nerves II  - XII - II - Visual field intact OU. III, IV, VI - Extraocular movements intact. V - Facial sensation intact bilaterally. VII - Facial movement intact bilaterally. VIII - Hearing & vestibular intact bilaterally. X - Palate elevates symmetrically. XI - Chin turning & shoulder shrug intact bilaterally. XII - Tongue protrusion intact.  Motor Strength - The patient's strength was normal in all extremities and pronator drift was absent.  Bulk was normal and fasciculations were absent.   Motor Tone - Muscle tone was assessed at the neck and appendages and was normal.  Reflexes - The patient's reflexes were 1+ in all extremities and he had no pathological reflexes.  Sensory - Light touch, temperature/pinprick, vibration and proprioception, and Romberg testing were assessed and were symmetrical.    Coordination - The patient had normal movements in the hands and feet with no ataxia or dysmetria.  Tremor was absent.  Gait and Station - The patient's transfers, posture, gait, station, and turns were observed as slow but  steady.    ASSESSMENT/PLAN [name]. is a 74 y.o. male with history of DM, HTN, hypercholesterolemia, CAD, ED, and nephrolithiasis presenting with left sided weakness. He did not receive IV t-PA due to delay in arrival.   Stroke:  Non-dominant right ACA infarct in setting of azygous ACA, infarct felt to be embolic secondary to unknown source   Resultant  Left hemiparesis resolved but decreased stamina  MRI  R ACA infarct  MRA  High grade stenosis proximal R M2 branch, azygous ACA (fills from left)  Carotid Doppler  unremarkable   2D Echo unremarkable  TEE unremarkable, no PFO. Loop recorder placed.  SCDs for VTE prophylaxis Diet Carb Modified Fluid consistency:: Thin; Room service appropriate?: Yes  no antithrombotic prior to admission, now on aspirin 325 mg orally every day. Continue aspirin 325 for stroke prevention.  Wife and Patient counseled to be  compliant with his antithrombotic medications  Ongoing aggressive stroke risk factor management  Therapy recommendations:  Outpatient PT OT   Disposition:  Home   Hypertension  Home meds:   lisinopril  Stable Permissive hypertension (OK if < 220/120) but gradually normalize in 5-7 days   Hyperlipidemia  Home meds:  zocor 40, resumed in hospital  LDL 78, goal < 70  Continue statin at discharge  Diabetes, type II  HgbA1c 7.3, goal < 7.0  Home meds - metformin, glipizide  Home meds resumed  DM education  SSI  Follow-up with PCP closely  Other Stroke Risk Factors  Advanced age  Cigarette smoker, quit smoking 50 years ago   Other Pertinent History  Known lung nodule  Hospital day # 2  Neurology will sign off. Please call with questions. Pt will follow up with Dr. Erlinda Hong at [facility] in about 1 month. Thanks for the consult.   Tony Hawking, MD PhD Stroke Neurology 05/21/2014 4:06 PM       To contact Stroke Continuity provider, please refer to [email or web address] After hours, contact General Neurology

## 2014-05-21 NOTE — Progress Notes (Signed)
D/C orders received. Pt and spouse educated on d/c instructions and stroke prevention. Pt verbalized understanding. IV and tele removed. Pt handed d/c packet and prescription. Pt taken downstairs by staff via wheelchair.

## 2014-05-21 NOTE — Progress Notes (Signed)
Physical Therapy Treatment Patient Details Name: Tony Jackson. MRN: [id number] DOB: [birth year] Today's Date: 05/21/2014    History of Present Illness 74 yo male with DM2, htn, hyperlipidemia, CAD, c/o left sided weakness, starting at about noon on 05/19/14. Pt also noted that he was stumbling and fell x3 prior to coming to the hospital. Denies numbness, tingling, dysarthria, vision change or dysphagia. Pt presented to the ED for evaluation and CT brain negative for acute process on 05/19/14, but MRA on 05/20/14 indicated acute small right MCA territory infarct.    PT Comments    Patient agreeable to ambulation even though just finishing with OT and about to go down for TEE. We were able to practice steps again and patient appeared to have more control and safety with steps this session. Wife and patient educated on safety at home and need for supervision with mobility at this time. Patient educated on signs and symptoms of CVA and appropriate response to take if occurs again. Patient safe to D/C from a mobility standpoint based on progression towards goals set on PT eval.    Follow Up Recommendations  Outpatient PT;Other (comment)     Equipment Recommendations       Recommendations for Other Services       Precautions / Restrictions Precautions Precautions: Fall Precaution Comments:  some Lt side neglect noted    Mobility  Bed Mobility               General bed mobility comments: Up in recliner before and after session  Transfers Overall transfer level: Needs assistance Equipment used: None   Sit to Stand: Supervision         General transfer comment: S for safety  Ambulation/Gait Ambulation/Gait assistance: Supervision Ambulation Distance (Feet): 600 Feet Assistive device: None   Gait velocity: grossly WNL   General Gait Details: Overall walking well on level floor with minimal distractions; some stagger with head turns but no ntoed LOB   Stairs    Stairs assistance: Min guard Stair Management: Step to pattern;One rail Right;Forwards Number of Stairs: 10 General stair comments: Better balance and control of steps today. [place] for safety. Wife and patient educated to not attempt steps without assistance at home.   Wheelchair Mobility    Modified Rankin (Stroke Patients Only)       Balance                                    Cognition Arousal/Alertness: Awake/alert Behavior During Therapy: WFL for tasks assessed/performed Overall Cognitive Status: Impaired/Different from baseline Area of Impairment: Memory     Memory: Decreased short-term memory              Exercises      General Comments        Pertinent Vitals/Pain Pain Assessment: No/denies pain    Home Living                      Prior Function            PT Goals (current goals can now be found in the care plan section) Progress towards PT goals: Progressing toward goals    Frequency  Min 4X/week    PT Plan Current plan remains appropriate    Co-evaluation             End of Session Equipment Utilized During Treatment: Gait belt  Activity Tolerance: Patient tolerated treatment well Patient left: in chair;with family/visitor present     Time: 1000-1015 PT Time Calculation (min) (ACUTE ONLY): 15 min  Charges:  $Gait Training: 8-22 mins                    G Codes:      Jacqualyn Posey 05/21/2014, 1:52 PM  05/21/2014 Jacqualyn Posey PTA [phone] pager [phone] office

## 2014-05-21 NOTE — H&P (View-Only) (Signed)
ELECTROPHYSIOLOGY CONSULT NOTE  Patient ID: Tony Jackson. MRN: [id number], DOB/AGE: 74-Oct-1942   Admit date: 05/19/2014 Date of Consult: 05/20/2014  Primary Physician: Viviana Simpler, MD Primary Cardiologist: NONE Reason for Consultation: Cryptogenic stroke; recommendations regarding Implantable Loop Recorder  History of Present Illness 74 year old male,Tony Jackson. was admitted on 05/19/2014 with Lt hemiparesis.  Imaging demonstrated acute small infarctinvolving the right MCA territory. MRA head showed focal mid to high-grade stenosis of proximal RIGHT M2 branch without large vessel occlusion.  He is undergoing workup for stroke including echocardiogram and carotid dopplers.  The patient has been monitored on telemetry which has demonstrated sinus rhythm with no arrhythmias.  Inpatient stroke work-up is to be completed with a TEE.   He has a hx of dm2, htn, hyperlipidemia, CAD, c/o left sided weakness, starting at about noon. Pt also noted that he was stumbling. Denied numbness, tingling, dysarthria, vision change.  Echocardiogram this admission demonstrated Pending.  Lab work is remarkable for Cr. 1.30, TG of 279 with LDL 71, neg troponin, TSH 2.955.  Prior to admission, the patient denies chest pain, shortness of breath, dizziness, palpitations, or syncope.  They are recovering from their stroke with plans still undecided at discharge for outpt rehab vs. Inpt.   EP has been asked to evaluate for placement of an implantable loop recorder to monitor for atrial fibrillation.  ROS is negative except as outlined above. No chest pain, possible rapid HR on occasion.   Past Medical History  Diagnosis Date  . Diabetes mellitus   . Hyperlipidemia   . Psoriasis   . Hypertension   . ED (erectile dysfunction)   . History of nephrolithiasis   . Elevated PSA   . Frequency of urination   . Lung nodule      Surgical History:  Past Surgical History  Procedure Laterality Date  .  Tonsillectomy  1960  . Nm myoview ltd      normal EF 56% 03/08  . Prostate biopsy  01/05/2012    Procedure: BIOPSY TRANSRECTAL ULTRASONIC PROSTATE (TUBP);  Surgeon: [name], MD;  Location[facility];  Service: Urology;  Laterality: N/A;  . Cataract extraction w/ intraocular lens implant Right 03/30/12    Dr Kathrin Penner     Prescriptions prior to admission  Medication Sig Dispense Refill Last Dose  . GLIPIZIDE XL 5 MG 24 hr tablet TAKE 1 TABLET DAILY 90 tablet 0 Past Week at Unknown time  . ketoconazole (NIZORAL) 2 % shampoo Apply 1 application topically 2 (two) times a week. 120 mL 5 Past Week at Unknown time  . lisinopril (PRINIVIL,ZESTRIL) 10 MG tablet Take 1 tablet (10 mg total) by mouth daily. 90 tablet 3 Past Week at Unknown time  . metFORMIN (GLUCOPHAGE) 1000 MG tablet Take 1 tablet (1,000 mg total) by mouth 2 (two) times daily with a meal. 180 tablet 3 Past Week at Unknown time  . mometasone (ELOCON) 0.1 % cream APPLY TO THE AFFECTED AREA TWO TIMES A DAY AS NEEDED 45 g 0 Past Week at Unknown time  . sildenafil (VIAGRA) 100 MG tablet Take 50-100 mg by mouth daily as needed.    Past Month at Unknown time  . simvastatin (ZOCOR) 40 MG tablet Take 1 tablet (40 mg total) by mouth at bedtime. 90 tablet 3 05/18/2014 at Unknown time  . triamcinolone cream (KENALOG) 0.1 % Apply 1 application topically 2 (two) times daily as needed. 45 g 3 Past Week at Unknown  time    Inpatient Medications:  . aspirin  300 mg Rectal Daily   Or  . aspirin  325 mg Oral Daily  . glipiZIDE  5 mg Oral Q breakfast  . insulin aspart  0-5 Units Subcutaneous QHS  . insulin aspart  0-9 Units Subcutaneous TID WC  . lisinopril  10 mg Oral Daily  . metFORMIN  1,000 mg Oral BID WC  . simvastatin  40 mg Oral QHS    Allergies: No Known Allergies  History   Social History  . Marital Status: Married    Spouse Name: N/A  . Number of Children: 2  . Years of Education: N/A   Occupational History    . Architect- paving    Social History Main Topics  . Smoking status: Former Smoker -- 1.00 packs/day for 5 years    Types: Cigarettes  . Smokeless tobacco: Current User    Types: Chew     Comment: QUIT SMOKING CIGARETTES 50 YRS AGO--  CHEWED TOBACCO FOR 40 YRS  . Alcohol Use: No  . Drug Use: No  . Sexual Activity: Not Currently   Other Topics Concern  . Not on file   Social History Narrative   No living will   Requests wife as health care POA.   Would accept resuscitation attempts   Not sure about tube feeds     Family History  Problem Relation Age of Onset  . Cancer Mother     breast cancer  . Diabetes Mother   . Heart disease Neg Hx      Physical Exam: Filed Vitals:   05/20/14 0715 05/20/14 0928 05/20/14 1133 05/20/14 1324  BP: 146/89 145/82 140/78 151/84  Pulse: 56 70 63 64  Temp: 98.2 F (36.8 C) 98.6 F (37 C) 98.2 F (36.8 C) 97.7 F (36.5 C)  TempSrc: Oral Oral Oral Oral  Resp: 18 18 20 20   Height:      Weight:      SpO2: 96% 97% 98% 100%    GEN- The patient is well appearing, alert and oriented x 3 today.   Head- normocephalic, atraumatic Eyes-  Sclera clear, conjunctiva pink Ears- hearing intact Neck- supple, no JVD no bruits Lungs- Clear to ausculation bilaterally, normal work of breathing Heart- Regular rate and rhythm, no murmurs, rubs or gallops, PMI not laterally displaced GI- soft, NT, + BS, do not palpate liver spleen or masses Extremities- no clubbing, cyanosis, or edema MS- no significant deformity or atrophy Skin- no rash or lesion Psych- euthymic mood, full affect   Labs:   Lab Results  Component Value Date   WBC 5.8 05/20/2014   HGB 13.6 05/20/2014   HCT 40.5 05/20/2014   MCV 90.8 05/20/2014   PLT 155 05/20/2014    Recent Labs Lab 05/20/14 0514  NA 141  K 4.2  CL 107  CO2 31  BUN 23  CREATININE 1.30  CALCIUM 9.2  PROT 5.9*  BILITOT 0.8  ALKPHOS 40  ALT 29  AST 22  GLUCOSE 133*   Lab Results  Component  Value Date   TROPONINI <0.30 10/08/2013   Lab Results  Component Value Date   CHOL 168 05/20/2014   CHOL 163 05/20/2014   CHOL 141 10/10/2013   Lab Results  Component Value Date   HDL 41 05/20/2014   HDL 41 05/20/2014   HDL 38* 10/10/2013   Lab Results  Component Value Date   LDLCALC 71 05/20/2014   [place] 78 05/20/2014  LDLCALC 65 10/10/2013   Lab Results  Component Value Date   TRIG 279* 05/20/2014   TRIG 218* 05/20/2014   TRIG 192* 10/10/2013   Lab Results  Component Value Date   CHOLHDL 4.1 05/20/2014   CHOLHDL 4.0 05/20/2014   CHOLHDL 3.7 10/10/2013   Lab Results  Component Value Date   LDLDIRECT 90.4 02/16/2009   LDLDIRECT 155.1 12/07/2006    Lab Results  Component Value Date   DDIMER 1.62* 10/07/2013     Radiology/Studies: Ct Head Wo Contrast  05/19/2014   CLINICAL DATA:  74 year old male with left-sided weakness since noon today. Poor balance and multiple falls since noon.  EXAM: CT HEAD WITHOUT CONTRAST  TECHNIQUE: Contiguous axial images were obtained from the base of the skull through the vertex without intravenous contrast.  COMPARISON:  No priors.  FINDINGS: Mild cerebral atrophy. Patchy and confluent areas of decreased attenuation are noted throughout the deep and periventricular white matter of the cerebral hemispheres bilaterally, compatible with chronic microvascular ischemic disease. Relatively well-defined foci of low attenuation in the medial aspect of the left cerebellar hemisphere adjacent to the fourth ventricle (image 7 of series 201), and in the inferior aspect of the left cerebellar hemisphere (image 4 of series 201), presumably from remote infarctions. No definite acute intracranial abnormalities. Specifically, no evidence of acute intracranial hemorrhage, no definite findings of acute/subacute cerebral ischemia, no mass, mass effect, hydrocephalus or abnormal intra or extra-axial fluid collections. Visualized paranasal sinuses and mastoids are  well pneumatized. No acute displaced skull fractures are identified.  IMPRESSION: 1. No definite acute intracranial abnormalities. 2. Mild cerebral atrophy with chronic microvascular ischemic changes in the cerebral white matter, and old lacunar infarctions in the left cerebellar hemisphere, as above.   Electronically Signed   By: Vinnie Langton M.D.   On: 05/19/2014 20:50   Ct Chest Wo Contrast  05/20/2014   CLINICAL DATA:  Follow up lung nodules.  EXAM: CT CHEST WITHOUT CONTRAST  TECHNIQUE: Multidetector CT imaging of the chest was performed following the standard protocol without IV contrast.  COMPARISON:  Chest CT 10/09/2013  FINDINGS: Multiple tiny nodules are scattered throughout both lungs that are unchanged from prior exam. These all measure less than 3 mm. No new nodules are seen. No dominant mass. Mild hypoventilatory change at the lung bases. No consolidation to suggest pneumonia.  The heart size is normal. Thoracic aorta is normal in caliber. No mediastinal or evidence of hilar adenopathy. No pleural or pericardial effusion. No acute abnormality in the included upper abdomen. There are no acute or suspicious osseous abnormalities.  IMPRESSION: Stable multiple bilateral pulmonary nodules, all measuring less than 3 mm. An additional six-month follow-up could be considered for 1 year assessment.   Electronically Signed   By: Jeb Levering M.D.   On: 05/20/2014 01:04   Mr Brain Wo Contrast  05/20/2014   CLINICAL DATA:  LEFT-sided weakness beginning at noon today, fell 3 times since noon today due to gait imbalance. History of diabetes, hyperlipidemia.  EXAM: MRI HEAD WITHOUT CONTRAST  MRA HEAD WITHOUT CONTRAST  TECHNIQUE: Multiplanar, multiecho pulse sequences of the brain and surrounding structures were obtained without intravenous contrast. Angiographic images of the head were obtained using MRA technique without contrast.  COMPARISON:  CT of the head May 19, 2014 at 2026 hours  FINDINGS: MRI  HEAD FINDINGS  Mildly motion degraded examination. Patchy 1.9 x 1.2 cm area of reduced diffusion in RIGHT frontal convexity involving the pre central gyrus. Corresponding low  ADC values. No susceptibility artifact to suggest hemorrhage.  Ventricles and sulci are normal for patient's age. No midline shift or mass effect. Patchy to confluent supratentorial and pontine white matter T2 hyperintensities. Remote bilateral small inferior cerebellar infarcts. Tiny bilateral basal ganglia prominent perivascular spaces.  No abnormal extra-axial fluid collections. Status post RIGHT ocular lens implant. Paranasal sinuses and mastoid air cells are well aerated. No abnormal sellar expansion. No cerebellar tonsillar ectopia. Bright T1 bone marrow signal most consistent osteopenia.  MRA HEAD FINDINGS  Anterior circulation: Normal flow related enhancement of the included cervical, petrous, cavernous and supra clinoid internal carotid arteries. Patent anterior communicating artery. Normal flow related enhancement of the anterior and middle cerebral arteries, including more distal segments.  No large vessel occlusion, aneurysm. Focal mid to high-grade stenosis of proximal RIGHT M2 branch. Mild luminal irregularity of the mid to distal anterior and middle cerebral arteries.  Posterior circulation: Codominant vertebral arteries. Basilar artery is patent, with normal flow related enhancement of the main branch vessels. Diminutive RIGHT P1 segment with compensatory rib best RIGHT posterior communicating artery. Normal flow related enhancement of the posterior cerebral arteries.  No large vessel occlusion, high-grade stenosis, aneurysm. Mild luminal irregularity of the mid to distal posterior cerebral arteries bilaterally.  IMPRESSION: MRI HEAD: Acute small RIGHT middle cerebral artery territory infarct.  Moderate to severe white matter changes suggest chronic small vessel ischemic disease. Remote bilateral small inferior cerebellar  infarcts.  MRA HEAD: Focal mid to high-grade stenosis of proximal RIGHT M2 branch without large vessel occlusion.  Mild luminal irregularity of the mid to distal cerebral arteries most consistent with atherosclerosis.   Electronically Signed   By: Elon Alas   On: 05/20/2014 01:52   Mr Jodene Nam Head/brain Wo Cm  05/20/2014   CLINICAL DATA:  LEFT-sided weakness beginning at noon today, fell 3 times since noon today due to gait imbalance. History of diabetes, hyperlipidemia.  EXAM: MRI HEAD WITHOUT CONTRAST  MRA HEAD WITHOUT CONTRAST  TECHNIQUE: Multiplanar, multiecho pulse sequences of the brain and surrounding structures were obtained without intravenous contrast. Angiographic images of the head were obtained using MRA technique without contrast.  COMPARISON:  CT of the head May 19, 2014 at 2026 hours  FINDINGS: MRI HEAD FINDINGS  Mildly motion degraded examination. Patchy 1.9 x 1.2 cm area of reduced diffusion in RIGHT frontal convexity involving the pre central gyrus. Corresponding low ADC values. No susceptibility artifact to suggest hemorrhage.  Ventricles and sulci are normal for patient's age. No midline shift or mass effect. Patchy to confluent supratentorial and pontine white matter T2 hyperintensities. Remote bilateral small inferior cerebellar infarcts. Tiny bilateral basal ganglia prominent perivascular spaces.  No abnormal extra-axial fluid collections. Status post RIGHT ocular lens implant. Paranasal sinuses and mastoid air cells are well aerated. No abnormal sellar expansion. No cerebellar tonsillar ectopia. Bright T1 bone marrow signal most consistent osteopenia.  MRA HEAD FINDINGS  Anterior circulation: Normal flow related enhancement of the included cervical, petrous, cavernous and supra clinoid internal carotid arteries. Patent anterior communicating artery. Normal flow related enhancement of the anterior and middle cerebral arteries, including more distal segments.  No large vessel  occlusion, aneurysm. Focal mid to high-grade stenosis of proximal RIGHT M2 branch. Mild luminal irregularity of the mid to distal anterior and middle cerebral arteries.  Posterior circulation: Codominant vertebral arteries. Basilar artery is patent, with normal flow related enhancement of the main branch vessels. Diminutive RIGHT P1 segment with compensatory rib best RIGHT posterior communicating artery. Normal flow  related enhancement of the posterior cerebral arteries.  No large vessel occlusion, high-grade stenosis, aneurysm. Mild luminal irregularity of the mid to distal posterior cerebral arteries bilaterally.  IMPRESSION: MRI HEAD: Acute small RIGHT middle cerebral artery territory infarct.  Moderate to severe white matter changes suggest chronic small vessel ischemic disease. Remote bilateral small inferior cerebellar infarcts.  MRA HEAD: Focal mid to high-grade stenosis of proximal RIGHT M2 branch without large vessel occlusion.  Mild luminal irregularity of the mid to distal cerebral arteries most consistent with atherosclerosis.   Electronically Signed   By: Elon Alas   On: 05/20/2014 01:52    12-lead ECG SR with 1st degree AV block PR 233ms.  No acute changes from previous.  All prior EKG's in EPIC reviewed with no documented atrial fibrillation only S tach  Telemetry SR to SB no atrial fib.  Assessment and Plan:  1. Cryptogenic stroke The patient presents with cryptogenic stroke.  The patient has a TEE planned for this AM.  I spoke at length with the patient about monitoring for afib with either a 30 day event monitor or an implantable loop recorder.  If  Reason for CVA noted prior to [facility] he will not need it to be placed. Risks, benefits, and alteratives to implantable loop recorder were discussed with the patient and wife today.   At this time, the patient is very clear in their decision to proceed with implantable loop recorder.    Please call with questions.  Cecilie Kicks,  FNP-C 05/20/2014 3:10 PM   I have seen, examined the patient, and reviewed the above assessment and plan.  On exam, RRR. Changes to above are made where necessary.   If TEE is unrevealing, will proceed with ILR implant later today.  Co Sign: [name], MD 05/21/2014 10:02 AM

## 2014-05-21 NOTE — Progress Notes (Signed)
CARE MANAGEMENT NOTE 05/21/2014  Patient:  Tony Jackson, Tony Jackson   Account Number:  [id number]  Date Initiated:  05/20/2014  Documentation initiated by:  Carles Collet  Subjective/Objective Assessment:   +cva, prior to admission lived at home with family     Action/Plan:   will continue to follow for discharge needs   Anticipated DC Date:  05/22/2014   Anticipated DC Plan:  [place]  CM consult      Choice offered to / List presented to:  C-1 Patient           Status of service:  Completed, signed off Medicare Important Message given?   (If response is "NO", the following Medicare IM given date fields will be blank) Date Medicare IM given:   Medicare IM given by:   Date Additional Medicare IM given:   Additional Medicare IM given by:    Discharge Disposition:    Per UR Regulation:    If discussed at Long Length of Stay Meetings, dates discussed:    Comments:  05-21-14 Provided pt with choice of [facility] or [facility]. Pt chose [facility]. Order, facesheet, request for PT and OT Carles Collet RN BSN CM

## 2014-05-21 NOTE — Progress Notes (Signed)
  Echocardiogram Echocardiogram Transesophageal has been performed.  Philipp Deputy 05/21/2014, 1:19 PM

## 2014-05-21 NOTE — Interval H&P Note (Signed)
History and Physical Interval Note:  05/21/2014 12:00 PM  Tony Jackson.  has presented today for surgery, with the diagnosis of STROKE  The various methods of treatment have been discussed with the patient and family. After consideration of risks, benefits and other options for treatment, the patient has consented to  Procedure(s): TRANSESOPHAGEAL ECHOCARDIOGRAM (TEE) (N/A) as a surgical intervention .  The patient's history has been reviewed, patient examined, no change in status, stable for surgery.  I have reviewed the patient's chart and labs.  Questions were answered to the patient's satisfaction.     SKAINS, MARK

## 2014-05-21 NOTE — CV Procedure (Signed)
    TEE  Indications: Stroke  Findings: No thrombus, Negative bubble study, normal EF.  Candee Furbish, MD

## 2014-05-22 ENCOUNTER — Encounter (HOSPITAL_COMMUNITY): Payer: Self-pay | Admitting: Cardiology

## 2014-05-22 ENCOUNTER — Telehealth: Payer: Self-pay | Admitting: Internal Medicine

## 2014-05-22 MED ORDER — METFORMIN HCL 1000 MG PO TABS: 1000.0000 mg | ORAL_TABLET | Freq: Two times a day (BID) | ORAL | Status: DC

## 2014-05-22 MED ORDER — SIMVASTATIN 40 MG PO TABS: 40.0000 mg | ORAL_TABLET | Freq: Every day | ORAL | Status: DC

## 2014-05-22 NOTE — Telephone Encounter (Signed)
Discussed his hospital stay Has the event monitor in No symptoms right now  [facility], He needs appt with me next week He also needs 10 days of some of his prescriptions---still awaiting his mail order. Please have someone send these for him (he doesn't know which ones he needs at this point)

## 2014-05-22 NOTE — Telephone Encounter (Signed)
Patient scheduled appointment on 05/27/14.

## 2014-05-22 NOTE — Telephone Encounter (Signed)
I spoke with pt and he needs a 10 day supply of metformin 1000mg , and simvastatin 40mg  sent to [facility] [address]. I will send rx's electronically

## 2014-05-22 NOTE — Telephone Encounter (Signed)
Rxs sent

## 2014-05-27 ENCOUNTER — Ambulatory Visit (INDEPENDENT_AMBULATORY_CARE_PROVIDER_SITE_OTHER): Payer: Medicare HMO | Admitting: Internal Medicine

## 2014-05-27 ENCOUNTER — Encounter: Payer: Self-pay | Admitting: Internal Medicine

## 2014-05-27 VITALS — BP 140/80 | HR 67 | Temp 98.2°F | Wt 177.0 lb

## 2014-05-27 DIAGNOSIS — I63429 Cerebral infarction due to embolism of unspecified anterior cerebral artery: Secondary | ICD-10-CM | POA: Diagnosis not present

## 2014-05-27 DIAGNOSIS — I10 Essential (primary) hypertension: Secondary | ICD-10-CM

## 2014-05-27 DIAGNOSIS — E1159 Type 2 diabetes mellitus with other circulatory complications: Secondary | ICD-10-CM

## 2014-05-27 NOTE — Assessment & Plan Note (Addendum)
Small MCA infarct TEE and carotids okay DM, BP and cholesterol all well controlled Has loop recorder in--will need more aggressive anticoagulation if any arrhythmia. On ASA for now

## 2014-05-27 NOTE — Progress Notes (Signed)
Subjective:    Patient ID: Tony Jackson., male    DOB: 1940-05-10, 74 y.o.   MRN: [id number]  HPI Here for follow up of recent hospitalization for RCA stroke Had sudden left sided weakness--delayed presentation (so no tPA) Left arm lying at side when walking also No sensory changes No speech or swallowing problems  Left arm now close to back to normal Still feels unsteady getting up and down Is back to work No falls since the initial symptoms  Has loop recorder implanted Has had some occasional sensation of fast heartbeat in evening at rest---no skips No chest pain  No SOB  Current Outpatient Prescriptions on File Prior to Visit  Medication Sig Dispense Refill  . aspirin EC 325 MG tablet Take 1 tablet (325 mg total) by mouth daily. 30 tablet 0  . GLIPIZIDE XL 5 MG 24 hr tablet TAKE 1 TABLET DAILY 90 tablet 0  . ketoconazole (NIZORAL) 2 % shampoo Apply 1 application topically 2 (two) times a week. 120 mL 5  . lisinopril (PRINIVIL,ZESTRIL) 10 MG tablet Take 1 tablet (10 mg total) by mouth daily. 90 tablet 3  . metFORMIN (GLUCOPHAGE) 1000 MG tablet Take 1 tablet (1,000 mg total) by mouth 2 (two) times daily with a meal. 20 tablet 1  . mometasone (ELOCON) 0.1 % cream APPLY TO THE AFFECTED AREA TWO TIMES A DAY AS NEEDED 45 g 0  . sildenafil (VIAGRA) 100 MG tablet Take 50-100 mg by mouth daily as needed.     . simvastatin (ZOCOR) 40 MG tablet Take 1 tablet (40 mg total) by mouth at bedtime. 10 tablet 1  . triamcinolone cream (KENALOG) 0.1 % Apply 1 application topically 2 (two) times daily as needed. 45 g 3   No current facility-administered medications on file prior to visit.    No Known Allergies  Past Medical History  Diagnosis Date  . Diabetes mellitus   . Hyperlipidemia   . Psoriasis   . Hypertension   . ED (erectile dysfunction)   . History of nephrolithiasis   . Elevated PSA   . Frequency of urination   . Lung nodule     Past Surgical History  Procedure  Laterality Date  . Tonsillectomy  1960  . Nm myoview ltd      normal EF 56% 03/08  . Prostate biopsy  01/05/2012    Procedure: BIOPSY TRANSRECTAL ULTRASONIC PROSTATE (TUBP);  Surgeon: [name], MD;  Location: [facility];  Service: Urology;  Laterality: N/A;  . Cataract extraction w/ intraocular lens implant Right 03/30/12    Dr Kathrin Penner  . Tee without cardioversion N/A 05/21/2014    Procedure: TRANSESOPHAGEAL ECHOCARDIOGRAM (TEE);  Surgeon: Jerline Pain, MD;  Location: [facility] ENDOSCOPY;  Service: Cardiovascular;  Laterality: N/A;    Family History  Problem Relation Age of Onset  . Cancer Mother     breast cancer  . Diabetes Mother   . Heart disease Neg Hx     History   Social History  . Marital Status: Married    Spouse Name: N/A  . Number of Children: 2  . Years of Education: N/A   Occupational History  . Architect- paving    Social History Main Topics  . Smoking status: Former Smoker -- 1.00 packs/day for 5 years    Types: Cigarettes  . Smokeless tobacco: Current User    Types: Chew     Comment: QUIT SMOKING CIGARETTES 50 YRS AGO--  CHEWED TOBACCO FOR 40  YRS  . Alcohol Use: No  . Drug Use: No  . Sexual Activity: Not Currently   Other Topics Concern  . Not on file   Social History Narrative   No living will   Requests wife as health care POA.   Would accept resuscitation attempts   Not sure about tube feeds   Review of Systems  Sleeping okay Bowels are fine Appetite is good     Objective:   Physical Exam  Constitutional: He is oriented to person, place, and time. He appears well-developed and well-nourished. No distress.  Neck: Normal range of motion. Neck supple. No thyromegaly present.  Cardiovascular: Normal rate, regular rhythm and normal heart sounds.  Exam reveals no gallop.   No murmur heard. Pulmonary/Chest: Effort normal and breath sounds normal. No respiratory distress. He has no wheezes. He has no rales.  Musculoskeletal: He  exhibits no edema or tenderness.  Lymphadenopathy:    He has no cervical adenopathy.  Neurological: He is alert and oriented to person, place, and time. He displays no atrophy and no tremor. No cranial nerve deficit. He exhibits normal muscle tone. He displays a negative Romberg sign. Coordination and gait normal.  Slight left arm weakness only  Psychiatric: He has a normal mood and affect. His behavior is normal.          Assessment & Plan:

## 2014-05-27 NOTE — Assessment & Plan Note (Signed)
Lab Results  Component Value Date   HGBA1C 7.3* 05/20/2014   Acceptable control No changes needed

## 2014-05-27 NOTE — Assessment & Plan Note (Signed)
BP Readings from Last 3 Encounters:  05/27/14 140/80  05/21/14 142/79  10/21/13 140/70   Reasonable control

## 2014-06-01 ENCOUNTER — Ambulatory Visit (INDEPENDENT_AMBULATORY_CARE_PROVIDER_SITE_OTHER): Payer: Medicare HMO | Admitting: *Deleted

## 2014-06-01 ENCOUNTER — Other Ambulatory Visit: Payer: Self-pay

## 2014-06-01 DIAGNOSIS — I639 Cerebral infarction, unspecified: Secondary | ICD-10-CM

## 2014-06-01 LAB — MDC_IDC_ENUM_SESS_TYPE_INCLINIC
Date Time Interrogation Session: 20160411121721
Zone Setting Detection Interval: 2000 ms
Zone Setting Detection Interval: 3000 ms
Zone Setting Detection Interval: 350 ms

## 2014-06-01 MED ORDER — SIMVASTATIN 40 MG PO TABS: 40.0000 mg | ORAL_TABLET | Freq: Every day | ORAL | Status: DC

## 2014-06-01 MED ORDER — METFORMIN HCL 1000 MG PO TABS: 1000.0000 mg | ORAL_TABLET | Freq: Two times a day (BID) | ORAL | Status: DC

## 2014-06-01 MED ORDER — LISINOPRIL 10 MG PO TABS: 10.0000 mg | ORAL_TABLET | Freq: Every day | ORAL | Status: DC

## 2014-06-01 NOTE — Telephone Encounter (Signed)
Tony Jackson called needs refills to [facility] for lisinopril,metformin and simvastatin, advised Tony Jackson done.

## 2014-06-01 NOTE — Progress Notes (Signed)
Wound check in clinic s/p ILR implant. Wound well healed without redness or edema.  Pt with 0 tachy episodes; 0 brady episodes; 0 asystole; 0 symptom; 0 AF episodes.  Plan to continue [facility] f/u QMO and f/u w/ JA prn.

## 2014-06-09 ENCOUNTER — Other Ambulatory Visit: Payer: Self-pay | Admitting: Internal Medicine

## 2014-06-16 ENCOUNTER — Encounter: Payer: Self-pay | Admitting: Internal Medicine

## 2014-06-19 ENCOUNTER — Ambulatory Visit (INDEPENDENT_AMBULATORY_CARE_PROVIDER_SITE_OTHER): Payer: Medicare HMO | Admitting: *Deleted

## 2014-06-19 DIAGNOSIS — I639 Cerebral infarction, unspecified: Secondary | ICD-10-CM

## 2014-06-24 NOTE — Progress Notes (Signed)
Loop recorder 

## 2014-07-02 ENCOUNTER — Encounter: Payer: Self-pay | Admitting: Cardiology

## 2014-07-06 LAB — HM DIABETES EYE EXAM

## 2014-07-07 ENCOUNTER — Encounter: Payer: Self-pay | Admitting: Internal Medicine

## 2014-07-09 ENCOUNTER — Encounter: Payer: Self-pay | Admitting: Cardiology

## 2014-07-09 LAB — CUP PACEART REMOTE DEVICE CHECK
MDC IDC SESS DTM: 20160403035641
MDC IDC SET ZONE DETECTION INTERVAL: 2000 ms
Zone Setting Detection Interval: 3000 ms
Zone Setting Detection Interval: 350 ms

## 2014-07-17 ENCOUNTER — Emergency Department (HOSPITAL_COMMUNITY): Payer: Medicare HMO

## 2014-07-17 ENCOUNTER — Emergency Department (HOSPITAL_COMMUNITY)
Admission: EM | Admit: 2014-07-17 | Discharge: 2014-07-17 | Disposition: A | Discharge status: Home / Self Care | Payer: Medicare HMO | Attending: Emergency Medicine | Admitting: Emergency Medicine

## 2014-07-17 ENCOUNTER — Encounter (HOSPITAL_COMMUNITY): Payer: Self-pay | Admitting: Emergency Medicine

## 2014-07-17 DIAGNOSIS — Z79899 Other long term (current) drug therapy: Secondary | ICD-10-CM | POA: Diagnosis not present

## 2014-07-17 DIAGNOSIS — Z87442 Personal history of urinary calculi: Secondary | ICD-10-CM | POA: Diagnosis not present

## 2014-07-17 DIAGNOSIS — I480 Paroxysmal atrial fibrillation: Secondary | ICD-10-CM | POA: Insufficient documentation

## 2014-07-17 DIAGNOSIS — N529 Male erectile dysfunction, unspecified: Secondary | ICD-10-CM | POA: Insufficient documentation

## 2014-07-17 DIAGNOSIS — Z872 Personal history of diseases of the skin and subcutaneous tissue: Secondary | ICD-10-CM | POA: Insufficient documentation

## 2014-07-17 DIAGNOSIS — I10 Essential (primary) hypertension: Secondary | ICD-10-CM | POA: Diagnosis not present

## 2014-07-17 DIAGNOSIS — E785 Hyperlipidemia, unspecified: Secondary | ICD-10-CM | POA: Diagnosis not present

## 2014-07-17 DIAGNOSIS — E119 Type 2 diabetes mellitus without complications: Secondary | ICD-10-CM | POA: Diagnosis not present

## 2014-07-17 DIAGNOSIS — R42 Dizziness and giddiness: Secondary | ICD-10-CM | POA: Diagnosis present

## 2014-07-17 DIAGNOSIS — Z87891 Personal history of nicotine dependence: Secondary | ICD-10-CM | POA: Diagnosis not present

## 2014-07-17 DIAGNOSIS — Z7982 Long term (current) use of aspirin: Secondary | ICD-10-CM | POA: Diagnosis not present

## 2014-07-17 DIAGNOSIS — R531 Weakness: Secondary | ICD-10-CM

## 2014-07-17 DIAGNOSIS — I471 Supraventricular tachycardia: Secondary | ICD-10-CM | POA: Insufficient documentation

## 2014-07-17 HISTORY — DX: Cerebral infarction, unspecified: I63.9

## 2014-07-17 LAB — TROPONIN I: Troponin I: <0.03 ng/mL (ref <0.031)

## 2014-07-17 LAB — CBC
HEMATOCRIT: 45.5 % (ref 39.0–52.0)
Hemoglobin: 15.8 g/dL (ref 13.0–17.0)
MCH: 31.3 pg (ref 26.0–34.0)
MCHC: 34.7 g/dL (ref 30.0–36.0)
MCV: 90.3 fL (ref 78.0–100.0)
PLATELETS: 221 10*3/uL (ref 150–400)
RBC: 5.04 MIL/uL (ref 4.22–5.81)
RDW: 13.5 % (ref 11.5–15.5)
WBC: 10.7 10*3/uL — ABNORMAL HIGH (ref 4.0–10.5)

## 2014-07-17 LAB — URINALYSIS, ROUTINE W REFLEX MICROSCOPIC
Bilirubin Urine: NEGATIVE
GLUCOSE, UA: 100 mg/dL — AB
HGB URINE DIPSTICK: NEGATIVE
Ketones, ur: NEGATIVE mg/dL
Leukocytes, UA: NEGATIVE
NITRITE: NEGATIVE
PH: 5 (ref 5.0–8.0)
PROTEIN: NEGATIVE mg/dL
SPECIFIC GRAVITY, URINE: 1.018 (ref 1.005–1.030)
Urobilinogen, UA: 0.2 mg/dL (ref 0.0–1.0)

## 2014-07-17 LAB — BASIC METABOLIC PANEL
Anion gap: 13 (ref 5–15)
BUN: 53 mg/dL — AB (ref 6–20)
CHLORIDE: 106 mmol/L (ref 101–111)
CO2: 18 mmol/L — AB (ref 22–32)
CREATININE: 2.52 mg/dL — AB (ref 0.61–1.24)
Calcium: 10 mg/dL (ref 8.9–10.3)
GFR, EST AFRICAN AMERICAN: 28 mL/min — AB (ref >60)
GFR, EST NON AFRICAN AMERICAN: 24 mL/min — AB (ref >60)
Glucose, Bld: 263 mg/dL — ABNORMAL HIGH (ref 65–99)
POTASSIUM: 4.6 mmol/L (ref 3.5–5.1)
SODIUM: 137 mmol/L (ref 135–145)

## 2014-07-17 LAB — I-STAT CG4 LACTIC ACID, ED
Lactic Acid, Venous: 2.51 mmol/L (ref 0.5–2.0)
Lactic Acid, Venous: 1.88 mmol/L (ref 0.5–2.0)

## 2014-07-17 LAB — I-STAT TROPONIN, ED: Troponin i, poc: 0.03 ng/mL (ref 0.00–0.08)

## 2014-07-17 MED ORDER — SODIUM CHLORIDE 0.9 % IV SOLN
1000.0000 mL | Freq: Once | INTRAVENOUS | Status: AC
  Administered 2014-07-17: 1000 mL via INTRAVENOUS

## 2014-07-17 MED ORDER — ADENOSINE 6 MG/2ML IV SOLN
6.0000 mg | Freq: Once | INTRAVENOUS | Status: AC
Start: 2014-07-17 — End: 2014-07-17
  Administered 2014-07-17: 6 mg via INTRAVENOUS
  Filled 2014-07-17: qty 2

## 2014-07-17 MED ORDER — DILTIAZEM HCL ER COATED BEADS 120 MG PO CP24: 120.0000 mg | ORAL_CAPSULE | Freq: Every day | ORAL | Status: DC

## 2014-07-17 MED ORDER — DILTIAZEM HCL 100 MG IV SOLR
5.0000 mg/h | INTRAVENOUS | Status: DC
  Administered 2014-07-17: 5 mg/h via INTRAVENOUS

## 2014-07-17 MED ORDER — SODIUM CHLORIDE 0.9 % IV SOLN
1000.0000 mL | INTRAVENOUS | Status: DC
  Administered 2014-07-17: 1000 mL via INTRAVENOUS

## 2014-07-17 MED ORDER — DILTIAZEM LOAD VIA INFUSION
10.0000 mg | Freq: Once | INTRAVENOUS | Status: AC
  Administered 2014-07-17: 10 mg via INTRAVENOUS
  Filled 2014-07-17: qty 10

## 2014-07-17 NOTE — ED Notes (Signed)
After adenosine, pt converted to NSR @ rate of 80. BP 115/69. Pt alert and oriented. NAD.

## 2014-07-17 NOTE — ED Provider Notes (Signed)
Medical screening examination/treatment/procedure(s) were conducted as a shared visit with non-physician practitioner(s) and myself.  I personally evaluated the patient during the encounter.   EKG Interpretation   Date/Time:  Friday Jul 17 2014 10:03:39 EDT Ventricular Rate:  141 PR Interval:  244 QRS Duration: 77 QT Interval:  297 QTC Calculation: 455 R Axis:   -8 Text Interpretation:  Atrial fibrillation Ventricular premature complex  RSR' in V1 or V2, right VCD or RVH similar to prior Confirmed by [facility]   MD, TREY ([id number]) on 07/17/2014 1:26:29 PM      74 yo male with dizziness and malaise, found to be tachycardic.  On exam, well appearing, nontoxic, not distressed, normal respiratory effort, heart sounds normal with tachycardic rate and grossly regular rhythm.  His underlying rhythm was difficult to determine, but ultimately felt to be SVT.  Adenosine given and he converted to NSR.  Cardiology felt he was stable for discharge with outpatient treatment.   Clinical Impression: SVT   Serita Grit, MD 07/17/14 1445

## 2014-07-17 NOTE — ED Notes (Signed)
Clarified with PA to give cardizem with pt BP currently 86/70. Recommends to still give loading dose and continuous infusion.

## 2014-07-17 NOTE — Discharge Instructions (Signed)
Check blood pressure/heart rate daily  Have labs done next week with Dr Silvio Pate - need to recheck kidney function Stay adequately hydrated Hold Lisinopril for now and start Cardizem daily Follow up with cardiology in 4 weeks - see appointment schedule Supraventricular Tachycardia Supraventricular tachycardia (SVT) is an abnormal heart rhythm (arrhythmia) that causes the heart to beat very fast (tachycardia). This kind of fast heartbeat originates in the upper chambers of the heart (atria). SVT can cause the heart to beat greater than 100 beats per minute. SVT can have a rapid burst of heartbeats. This can start and stop suddenly without warning and is called nonsustained. SVT can also be sustained, in which the heart beats at a continuous fast rate.  CAUSES  There can be different causes of SVT. Some of these include:  Heart valve problems such as mitral valve prolapse.  An enlarged heart (hypertrophic cardiomyopathy).  Congenital heart problems.  Heart inflammation (pericarditis).  Hyperthyroidism.  Low potassium or magnesium levels.  Caffeine.  Drug use such as cocaine, methamphetamines, or stimulants.  Some over-the-counter medicines such as:  Decongestants.  Diet medicines.  Herbal medicines. SYMPTOMS  Symptoms of SVT can vary. Symptoms depend on whether the SVT is sustained or nonsustained. You may experience:  No symptoms (asymptomatic).  An awareness of your heart beating rapidly (palpitations).  Shortness of breath.  Chest pain or pressure. If your blood pressure drops because of the SVT, you may experience:  Fainting or near fainting.  Weakness.  Dizziness. DIAGNOSIS  Different tests can be performed to diagnose SVT, such as:  An electrocardiogram (EKG). This is a painless test that records the electrical activity of your heart.  Holter monitor. This is a 24 hour recording of your heart rhythm. You will be given a diary. Write down all symptoms that you  have and what you were doing at the time you experienced symptoms.  Arrhythmia monitor. This is a small device that your wear for several weeks. It records the heart rhythm when you have symptoms.  Echocardiogram. This is an imaging test to help detect abnormal heart structure such as congenital abnormalities, heart valve problems, or heart enlargement.  Stress test. This test can help determine if the SVT is related to exercise.  Electrophysiology study (EPS). This is a procedure that evaluates your heart's electrical system and can help your caregiver find the cause of your SVT. TREATMENT  Treatment of SVT depends on the symptoms, how often it recurs, and whether there are any underlying heart problems.   If symptoms are rare and no other cardiac disease is present, no treatment may be needed.  Blood work may be done to check potassium, magnesium, and thyroid hormone levels to see if they are abnormal. If these levels are abnormal, treatment to correct the problems will occur. Medicines Your caregiver may use oral medicines to treat SVT. These medicines are given for long-term control of SVT. Medicines may be used alone or in combination with other treatments. These medicines work to slow nerve impulses in the heart muscle. These medicines can also be used to treat high blood pressure. Some of these medicines may include:  Calcium channel blockers.  Beta blockers.  Digoxin. Nonsurgical procedures Nonsurgical techniques may be used if oral medicines do not work. Some examples include:  Cardioversion. This technique uses either drugs or an electrical shock to restore a normal heart rhythm.  Cardioversion drugs may be given through an intravenous (IV) line to help "reset" the heart rhythm.  In  electrical cardioversion, the caregiver shocks your heart to stop its beat for a split second. This helps to reset the heart to a normal rhythm.  Ablation. This procedure is done under mild  sedation. High frequency radio wave energy is used to destroy the area of heart tissue responsible for the SVT. HOME CARE INSTRUCTIONS   Do not smoke.  Only take medicines prescribed by your caregiver. Check with your caregiver before using over-the-counter medicines.  Check with your caregiver about how much alcohol and caffeine (coffee, tea, colas, or chocolate) you may have.  It is very important to keep all follow-up referrals and appointments in order to properly manage this problem. SEEK IMMEDIATE MEDICAL CARE IF:  You have dizziness.  You faint or nearly faint.  You have shortness of breath.  You have chest pain or pressure.  You have sudden nausea or vomiting.  You have profuse sweating.  You are concerned about how long your symptoms last.  You are concerned about the frequency of your SVT episodes. If you have the above symptoms, call your local emergency services (911 in [place] immediately. Do not drive yourself to the hospital. MAKE SURE YOU:   Understand these instructions.  Will watch your condition.  Will get help right away if you are not doing well or get worse. Document Released: 02/06/2005 Document Revised: 05/01/2011 Document Reviewed: 05/21/2008 [facility] Patient Information 2015 [place], [place]. This information is not intended to replace advice given to you by your health care provider. Make sure you discuss any questions you have with your health care provider.

## 2014-07-17 NOTE — ED Provider Notes (Signed)
CSN: [id number]     Arrival date & time 07/17/14  0757 History   First MD Initiated Contact with Patient 07/17/14 0802     No chief complaint on file.    (Consider location/radiation/quality/duration/timing/severity/associated sxs/prior Treatment) Patient is a 74 y.o. male presenting with weakness. The history is provided by the patient. No language interpreter was used.  Weakness This is a new problem. The current episode started yesterday. The problem occurs constantly. The problem has been gradually worsening. Associated symptoms include weakness. Pertinent negatives include no chest pain or coughing. Nothing aggravates the symptoms. The treatment provided moderate relief.   Pt complains of feeling weak, Pt feels like he is dehydrated,   Pt reports heart is beating fast. Past Medical History  Diagnosis Date  . Diabetes mellitus   . Hyperlipidemia   . Psoriasis   . Hypertension   . ED (erectile dysfunction)   . History of nephrolithiasis   . Elevated PSA   . Frequency of urination   . Lung nodule    Past Surgical History  Procedure Laterality Date  . Tonsillectomy  1960  . Nm myoview ltd      normal EF 56% 03/08  . Prostate biopsy  01/05/2012    Procedure: BIOPSY TRANSRECTAL ULTRASONIC PROSTATE (TUBP);  Surgeon: [name], MD;  Location: [facility];  Service: Urology;  Laterality: N/A;  . Cataract extraction w/ intraocular lens implant Right 03/30/12    Dr Kathrin Penner  . Tee without cardioversion N/A 05/21/2014    Procedure: TRANSESOPHAGEAL ECHOCARDIOGRAM (TEE);  Surgeon: Jerline Pain, MD;  Location: [facility] ENDOSCOPY;  Service: Cardiovascular;  Laterality: N/A;   Family History  Problem Relation Age of Onset  . Cancer Mother     breast cancer  . Diabetes Mother   . Heart disease Neg Hx    History  Substance Use Topics  . Smoking status: Former Smoker -- 1.00 packs/day for 5 years    Types: Cigarettes  . Smokeless tobacco: Current User    Types: Chew      Comment: QUIT SMOKING CIGARETTES 50 YRS AGO--  CHEWED TOBACCO FOR 40 YRS  . Alcohol Use: No    Review of Systems  Respiratory: Negative for cough and shortness of breath.   Cardiovascular: Negative for chest pain.  Neurological: Positive for weakness.  All other systems reviewed and are negative.     Allergies  Review of patient's allergies indicates no known allergies.  Home Medications   Prior to Admission medications   Medication Sig Start Date End Date Taking? Authorizing Provider  aspirin EC 325 MG tablet Take 1 tablet (325 mg total) by mouth daily. 05/21/14   Padmaja Vasireddy, MD  GLIPIZIDE XL 5 MG 24 hr tablet TAKE 1 TABLET DAILY 05/18/14   [name], MD  ketoconazole (NIZORAL) 2 % shampoo Apply 1 application topically 2 (two) times a week. 07/25/13   [name], MD  lisinopril (PRINIVIL,ZESTRIL) 10 MG tablet Take 1 tablet (10 mg total) by mouth daily. 06/01/14   [name], MD  metFORMIN (GLUCOPHAGE) 1000 MG tablet TAKE 1 TABLET (1,000 MG TOTAL) BY MOUTH 2 (TWO) TIMES DAILY WITH A MEAL. 06/10/14   [name], MD  mometasone (ELOCON) 0.1 % cream APPLY TO THE AFFECTED AREA TWO TIMES A DAY AS NEEDED 05/18/14   [name], MD  sildenafil (VIAGRA) 100 MG tablet Take 50-100 mg by mouth daily as needed.     Historical Provider, MD  simvastatin (ZOCOR)  40 MG tablet TAKE 1 TABLET (40 MG TOTAL) BY MOUTH AT BEDTIME. 06/10/14   [name], MD  triamcinolone cream (KENALOG) 0.1 % Apply 1 application topically 2 (two) times daily as needed. 07/25/13   [name], MD   There were no vitals taken for this visit. Physical Exam  Constitutional: He is oriented to person, place, and time. He appears well-developed and well-nourished.  HENT:  Head: Normocephalic and atraumatic.  Right Ear: External ear normal.  Nose: Nose normal.  Mouth/Throat: Oropharynx is clear and moist.  Eyes: EOM are normal. Pupils are equal, round, and reactive to light.  Neck:  Normal range of motion.  Cardiovascular: Normal heart sounds and intact distal pulses.   Tachycardia 150  Pulmonary/Chest: Effort normal.  Abdominal: He exhibits no distension.  Musculoskeletal: Normal range of motion.  Neurological: He is alert and oriented to person, place, and time.  Skin: Skin is warm.  Psychiatric: He has a normal mood and affect.  Nursing note and vitals reviewed.   ED Course  CRITICAL CARE Performed by: Fransico Meadow Authorized by: Fransico Meadow Total critical care time: 45 minutes Critical care time was exclusive of separately billable procedures and treating other patients and teaching time. Critical care was necessary to treat or prevent imminent or life-threatening deterioration of the following conditions: dehydration. Critical care was time spent personally by me on the following activities: development of treatment plan with patient or surrogate, discussions with consultants, interpretation of cardiac output measurements, evaluation of patient's response to treatment, examination of patient, obtaining history from patient or surrogate, ordering and performing treatments and interventions, ordering and review of laboratory studies, ordering and review of radiographic studies, pulse oximetry, re-evaluation of patient's condition and review of old charts. Subsequent provider of critical care: I assumed direction of critical care for this patient from another provider of my specialty.   (including critical care time) Labs Review Labs Reviewed - No data to display  Imaging Review Dg Chest [facility] 1 View  07/17/2014   CLINICAL DATA:  Dizziness, tachycardia, hypotension, history of diabetes and previous tobacco use, known multiple pulmonary nodules measuring 3 mm in diameter or less.  EXAM: PORTABLE CHEST - 1 VIEW  COMPARISON:  CT scan of the chest of May 20, 2014 portable chest x-ray of October 08, 2013.  FINDINGS: The lungs are adequately inflated and clear. No  infiltrates or abnormal nodules are demonstrated. The heart and pulmonary vascularity are normal. There is an implantable cardiac monitoring device present. There is no pleural effusion or pneumothorax. There is old deformity of the posterior aspects of the right fifth through seventh ribs.  IMPRESSION: There is no active cardiopulmonary disease. There are known multiple tiny pulmonary nodules noted on the previous CT scan. A follow-up regimen has been outlined.   Electronically Signed   By: David  Martinique M.D.   On: 07/17/2014 08:39     EKG Interpretation   Date/Time:  Friday Jul 17 2014 13:09:04 EDT Ventricular Rate:  79 PR Interval:  236 QRS Duration: 75 QT Interval:  344 QTC Calculation: 394 R Axis:   12 Text Interpretation:  Sinus rhythm Prolonged PR interval Abnormal R-wave  progression, early transition Baseline wander in lead(s) V2 now clearly  sinus rhythm with normal rate Confirmed by [facility]  MD, TREY ([id number]) on  07/17/2014 1:27:01 PM    .cc  MDM  Pt given Iv ns x 2 liters,  Pt's heart rate decreased from 160 to 140's.  Labs returned.  Troponin normal.  Pt has elevated bun and creatine.  Lactic acid 2.51.   Pt given cardizem 10  And then drip at 5.   Pt reports feeling much better.  I spoke to cardiology who will see and evaluate.  Cardiology evaluated pt and chose to give adenosine.  Heart rate 80 post adenosine.  Cardiology does not feel pt has afib.   Dr. Caryl Comes advised discharge with follow up instruction.   Pt given rx for cardizem 120 CD Final diagnoses:  Weakness  Paroxysmal atrial fibrillation        Fransico Meadow, PA-C 07/17/14 1352  Serita Grit, MD 07/21/14 1202

## 2014-07-17 NOTE — Consult Note (Signed)
ELECTROPHYSIOLOGY CONSULT NOTE    Patient ID: Tony Jackson. MRN: [id number], DOB/AGE: 07/26/1940 74 y.o.  Admit date: 07/17/2014 Date of Consult: 07/17/2014  Primary Physician: Viviana Simpler, MD Electrophysiologist: Allred  Reason for Consultation: SVT  HPI:  Tony Jackson. is a 74 y.o. male with a past medical history significant for diabetes, hypertension, hyperlipidemia, and stroke. For the past 2 days, he has had increasing weakness, fatigue, dizziness and pre-syncope.  He checked his blood pressure this morning and his blood pressure was low and his HR was 160.  He presented to the ER for further evaluation.  He was placed on a Cardizem drip with slowing of his heart rate but without termination of tachycardia.   Echocardiogram 05/20/14 demonstrated EF 60-65%, no RWMA, grade 1 diastolic dysfunction, PA pressure 26, LA 38.   Lab work is notable for normal troponin, creat 2.52, initially elevated lactic acid but repeat normal.   He currently denies chest pain, shortness of breath, lower extremity edema, no recent fevers or chills.  He has a non-productive cough.  He has no awareness of palpitations.   He was hospitalized in Sept for "dehydration with HR 130"  ECG review demonstrates SVT consistent w AVNRT at that time as well :(((  He hasd stroke 3/16 from which he has largely recovered   Past Medical History  Diagnosis Date  . Diabetes mellitus   . Hyperlipidemia   . Psoriasis   . Hypertension   . ED (erectile dysfunction)   . History of nephrolithiasis   . Elevated PSA   . Frequency of urination   . Lung nodule      Surgical History:  Past Surgical History  Procedure Laterality Date  . Tonsillectomy  1960  . Nm myoview ltd      normal EF 56% 03/08  . Prostate biopsy  01/05/2012    Procedure: BIOPSY TRANSRECTAL ULTRASONIC PROSTATE (TUBP);  Surgeon: [name], MD;  Location: [facility];  Service: Urology;  Laterality: N/A;  . Cataract  extraction w/ intraocular lens implant Right 03/30/12    Dr Kathrin Penner  . Tee without cardioversion N/A 05/21/2014    Procedure: TRANSESOPHAGEAL ECHOCARDIOGRAM (TEE);  Surgeon: Jerline Pain, MD;  Location: [facility] ENDOSCOPY;  Service: Cardiovascular;  Laterality: N/A;     Current outpatient prescriptions:  .  aspirin EC 325 MG tablet, Take 1 tablet (325 mg total) by mouth daily., Disp: 30 tablet, Rfl: 0 .  GLIPIZIDE XL 5 MG 24 hr tablet, TAKE 1 TABLET DAILY, Disp: 90 tablet, Rfl: 0 .  ketoconazole (NIZORAL) 2 % shampoo, Apply 1 application topically 2 (two) times a week., Disp: 120 mL, Rfl: 5 .  lisinopril (PRINIVIL,ZESTRIL) 10 MG tablet, Take 1 tablet (10 mg total) by mouth daily., Disp: 90 tablet, Rfl: 1 .  metFORMIN (GLUCOPHAGE) 1000 MG tablet, TAKE 1 TABLET (1,000 MG TOTAL) BY MOUTH 2 (TWO) TIMES DAILY WITH A MEAL., Disp: 60 tablet, Rfl: 8 .  simvastatin (ZOCOR) 40 MG tablet, TAKE 1 TABLET (40 MG TOTAL) BY MOUTH AT BEDTIME., Disp: 30 tablet, Rfl: 8 .  mometasone (ELOCON) 0.1 % cream, APPLY TO THE AFFECTED AREA TWO TIMES A DAY AS NEEDED (Patient taking differently: APPLY TO THE AFFECTED AREA TWO TIMES A DAY AS NEEDED FOR IRRITATION), Disp: 45 g, Rfl: 0 .  sildenafil (VIAGRA) 100 MG tablet, Take 50-100 mg by mouth daily as needed for erectile dysfunction. , Disp: , Rfl:  .  triamcinolone cream (KENALOG) 0.1 %,  Apply 1 application topically 2 (two) times daily as needed. (Patient taking differently: Apply 1 application topically 2 (two) times daily as needed (Irritation). ), Disp: 45 g, Rfl: 3   (Not in a hospital admission)  Inpatient Medications:  . adenosine (ADENOCARD) IV  6 mg Intravenous Once    Allergies: No Known Allergies  History   Social History  . Marital Status: Married    Spouse Name: N/A  . Number of Children: 2  . Years of Education: N/A   Occupational History  . Architect- paving    Social History Main Topics  . Smoking status: Former Smoker -- 1.00 packs/day for  5 years    Types: Cigarettes  . Smokeless tobacco: Current User    Types: Chew     Comment: QUIT SMOKING CIGARETTES 50 YRS AGO--  CHEWED TOBACCO FOR 40 YRS  . Alcohol Use: No  . Drug Use: No  . Sexual Activity: Not Currently   Other Topics Concern  . Not on file   Social History Narrative   No living will   Requests wife as health care POA.   Would accept resuscitation attempts   Not sure about tube feeds     Family History  Problem Relation Age of Onset  . Cancer Mother     breast cancer  . Diabetes Mother   . Heart disease Neg Hx      Review of Systems: All other systems reviewed and are otherwise negative except as noted above.  Physical Exam: Filed Vitals:   07/17/14 1126 07/17/14 1129 07/17/14 1130 07/17/14 1145  BP: 92/61 92/61 91/65  96/54  Pulse:      Temp:      TempSrc:      Resp:    21  Height:      Weight:      SpO2: 96% 97% 96% 93%    GEN- The patient is elderly appearing, alert and oriented x 3 today.   HEENT: normocephalic, atraumatic; sclera clear, conjunctiva pink; hearing intact; oropharynx clear; neck supple, no JVP Lymph- no cervical lymphadenopathy Lungs- Clear to ausculation bilaterally, normal work of breathing.  No wheezes, rales, rhonchi Heart- Tachycardic regular rate and rhythm  GI- soft, non-tender, non-distended, bowel sounds present  Extremities- no clubbing, cyanosis, or edema; DP/PT/radial pulses 2+ bilaterally MS- no significant deformity or atrophy Skin- warm and dry, no rash or lesion Psych- euthymic mood, full affect Neuro- strength and sensation are intact  Labs:   Lab Results  Component Value Date   WBC 10.7* 07/17/2014   HGB 15.8 07/17/2014   HCT 45.5 07/17/2014   MCV 90.3 07/17/2014   PLT 221 07/17/2014    Recent Labs Lab 07/17/14 0824  NA 137  K 4.6  CL 106  CO2 18*  BUN 53*  CREATININE 2.52*  CALCIUM 10.0  GLUCOSE 263*      Radiology/Studies: Dg Chest Port 1 View 07/17/2014   CLINICAL DATA:   Dizziness, tachycardia, hypotension, history of diabetes and previous tobacco use, known multiple pulmonary nodules measuring 3 mm in diameter or less.  EXAM: PORTABLE CHEST - 1 VIEW  COMPARISON:  CT scan of the chest of May 20, 2014 portable chest x-ray of October 08, 2013.  FINDINGS: The lungs are adequately inflated and clear. No infiltrates or abnormal nodules are demonstrated. The heart and pulmonary vascularity are normal. There is an implantable cardiac monitoring device present. There is no pleural effusion or pneumothorax. There is old deformity of the posterior aspects of the  right fifth through seventh ribs.  IMPRESSION: There is no active cardiopulmonary disease. There are known multiple tiny pulmonary nodules noted on the previous CT scan. A follow-up regimen has been outlined.   Electronically Signed   By: David  Martinique M.D.   On: 07/17/2014 08:39    TJQ:ZESPQ RP tachycardia, rate 160,narrow QRS  TELEMETRY: short RP tachycardia with PVC's  DEVICE HISTORY: MDT LINQ implanted 04/2014 for cryptogenic stroke   Assessment/Plan: 1.  Short RP tachycardia-recurrent  The patient has a short RP tachycardia that has not been responsive to Cardizem, vagal maneuvers, and carotid massage.  6mg  of adenosine given which terminated tachycardia Will reprogram LINQ to capture burden of SVT Discontinue Lisinopril and start Cardizem 120mg  daily If he has recurrent SVT on Cardizem, will consider ablation Follow up with me in 4 weeks.  2.  Acute renal insufficiency Likely due to prolonged SVT Discontinue Lisinopril as above Will check BMET next week  3. Cryptogenic stroke Continue ASA, statin Continue LINQ monitoring for atrial fibrillation  4.  HTN Stable No change required today  5.  Diabetes Hgb A1C 7.3 in March Continue Glipizide Working with PCP on glycemic control If renal function stabilizes, will need to restart ACE-I  Ok to discharge home from ER after fluid bolus complete.    Will check BMET next week and follow up with me in the office in 4 weeks. Hold Lisinopril and start Cardizem 120mg  daily today   Signed, Chanetta Marshall, NP 07/17/2014 12:29 PM  As above as amended Notably this has been recurrent and assoc ( giuven paucity of symptoms) with significant disruption of rfenal function He should prob proceed towards RFCA as primary therapy Hydrarte recheckBMETY Med changes as listed above Will arrang cardiac fokllowup

## 2014-07-17 NOTE — ED Notes (Addendum)
Cardiology PA at bedside. Requesting to push 6 mg adenosine. Ordered to stop Cardizem at this current time. Pt placed on zoll monitor. Waiting to push when MD is present. 91/56 BP HR @ 120. Pt alert and oriented x4. NAD.

## 2014-07-17 NOTE — ED Notes (Signed)
Pt c/o dizziness, low BP, high hr. Dizziness began yesterday. Visitor reports that pt bp was 78/56 and heart rate 162 today.

## 2014-07-18 LAB — URINE CULTURE: Colony Count: 7000

## 2014-07-21 ENCOUNTER — Ambulatory Visit (INDEPENDENT_AMBULATORY_CARE_PROVIDER_SITE_OTHER): Payer: Medicare HMO | Admitting: *Deleted

## 2014-07-21 DIAGNOSIS — I639 Cerebral infarction, unspecified: Secondary | ICD-10-CM

## 2014-07-22 ENCOUNTER — Encounter: Payer: Self-pay | Admitting: Internal Medicine

## 2014-07-22 ENCOUNTER — Ambulatory Visit (INDEPENDENT_AMBULATORY_CARE_PROVIDER_SITE_OTHER): Payer: Medicare HMO | Admitting: Internal Medicine

## 2014-07-22 VITALS — BP 138/80 | HR 77 | Temp 98.0°F

## 2014-07-22 DIAGNOSIS — I10 Essential (primary) hypertension: Secondary | ICD-10-CM | POA: Diagnosis not present

## 2014-07-22 DIAGNOSIS — I471 Supraventricular tachycardia: Secondary | ICD-10-CM

## 2014-07-22 NOTE — Assessment & Plan Note (Signed)
BP Readings from Last 3 Encounters:  07/22/14 138/80  07/17/14 109/85  05/27/14 140/80   Repeat 146/80 on right He is getting much higher numbers at home Asked him to bring his cuff in to visit with Dr Caryl Comes to correlate May need to restart the lisinopril

## 2014-07-22 NOTE — Progress Notes (Signed)
Subjective:    Patient ID: Tony Jackson., male    DOB: [birth year], 74 y.o.   MRN: [id number]  HPI Here for ER follow up Had busy day in the heat last week-- would awaken just as tired as he went to sleep Felt worse 5/27--- went to ER Found to be tachycardic Eventually given adenosine and converted from apparent SVT Still has monitor from the stroke--reprogrammed to look for SVT Now on diltiazem  BP still elevated--- 160-170/100 on his own machine No headache No chest pain No palpitations now Only slight dizziness-- no change since the stroke (unsteadiness) Did work yesterday  Current Outpatient Prescriptions on File Prior to Visit  Medication Sig Dispense Refill  . aspirin EC 325 MG tablet Take 1 tablet (325 mg total) by mouth daily. 30 tablet 0  . GLIPIZIDE XL 5 MG 24 hr tablet TAKE 1 TABLET DAILY 90 tablet 0  . ketoconazole (NIZORAL) 2 % shampoo Apply 1 application topically 2 (two) times a week. 120 mL 5  . lisinopril (PRINIVIL,ZESTRIL) 10 MG tablet Take 1 tablet (10 mg total) by mouth daily. 90 tablet 1  . metFORMIN (GLUCOPHAGE) 1000 MG tablet TAKE 1 TABLET (1,000 MG TOTAL) BY MOUTH 2 (TWO) TIMES DAILY WITH A MEAL. 60 tablet 8  . mometasone (ELOCON) 0.1 % cream APPLY TO THE AFFECTED AREA TWO TIMES A DAY AS NEEDED (Patient taking differently: APPLY TO THE AFFECTED AREA TWO TIMES A DAY AS NEEDED FOR IRRITATION) 45 g 0  . sildenafil (VIAGRA) 100 MG tablet Take 50-100 mg by mouth daily as needed for erectile dysfunction.     . triamcinolone cream (KENALOG) 0.1 % Apply 1 application topically 2 (two) times daily as needed. (Patient taking differently: Apply 1 application topically 2 (two) times daily as needed (Irritation). ) 45 g 3   No current facility-administered medications on file prior to visit.    No Known Allergies  Past Medical History  Diagnosis Date  . Diabetes mellitus   . Hyperlipidemia   . Psoriasis   . Hypertension   . ED (erectile dysfunction)   .  History of nephrolithiasis   . Elevated PSA   . Frequency of urination   . Lung nodule   . CVA (cerebral infarction)   . PSVT (paroxysmal supraventricular tachycardia)     Past Surgical History  Procedure Laterality Date  . Tonsillectomy  1960  . Nm myoview ltd      normal EF 56% 03/08  . Prostate biopsy  01/05/2012    Procedure: BIOPSY TRANSRECTAL ULTRASONIC PROSTATE (TUBP);  Surgeon: [name], MD;  Location: [facility];  Service: Urology;  Laterality: N/A;  . Cataract extraction w/ intraocular lens implant Right 03/30/12    Dr Kathrin Penner  . Tee without cardioversion N/A 05/21/2014    Procedure: TRANSESOPHAGEAL ECHOCARDIOGRAM (TEE);  Surgeon: Jerline Pain, MD;  Location: [facility] ENDOSCOPY;  Service: Cardiovascular;  Laterality: N/A;    Family History  Problem Relation Age of Onset  . Cancer Mother     breast cancer  . Diabetes Mother   . Heart disease Neg Hx     History   Social History  . Marital Status: Married    Spouse Name: N/A  . Number of Children: 2  . Years of Education: N/A   Occupational History  . Architect- paving    Social History Main Topics  . Smoking status: Former Smoker -- 1.00 packs/day for 5 years    Types: Cigarettes  .  Smokeless tobacco: Current User    Types: Chew     Comment: QUIT SMOKING CIGARETTES 50 YRS AGO--  CHEWED TOBACCO FOR 40 YRS  . Alcohol Use: No  . Drug Use: No  . Sexual Activity: Not Currently   Other Topics Concern  . Not on file   Social History Narrative   No living will   Requests wife as health care POA.   Would accept resuscitation attempts   Not sure about tube feeds   Review of Systems  Appetite is fine Sleeping fine     Objective:   Physical Exam  Constitutional: He appears well-developed and well-nourished.  Neck: Normal range of motion. Neck supple. No thyromegaly present.  Cardiovascular: Normal rate, regular rhythm, normal heart sounds and intact distal pulses.  Exam reveals no  gallop.   No murmur heard. Pulmonary/Chest: Effort normal and breath sounds normal. No respiratory distress. He has no wheezes. He has no rales.  Musculoskeletal: He exhibits no edema or tenderness.  Lymphadenopathy:    He has no cervical adenopathy.  Psychiatric: He has a normal mood and affect. His behavior is normal.          Assessment & Plan:

## 2014-07-22 NOTE — Assessment & Plan Note (Addendum)
Doesn't seem to have a recurrence since leaving ER Tolerating the diltiazem EKG shows sinus with 1st degree A-V block Will continue the diltiazem and he will keep follow up with Dr Caryl Comes

## 2014-07-22 NOTE — Progress Notes (Signed)
Pre visit review using our clinic review tool, if applicable. No additional management support is needed unless otherwise documented below in the visit note. 

## 2014-07-23 ENCOUNTER — Encounter: Payer: Self-pay | Admitting: Internal Medicine

## 2014-07-24 NOTE — Progress Notes (Signed)
Loop recorder 

## 2014-07-28 LAB — CUP PACEART REMOTE DEVICE CHECK
Date Time Interrogation Session: 20160527133551
MDC IDC SET ZONE DETECTION INTERVAL: 350 ms
Zone Setting Detection Interval: 2000 ms
Zone Setting Detection Interval: 3000 ms

## 2014-07-29 ENCOUNTER — Telehealth: Payer: Self-pay | Admitting: Internal Medicine

## 2014-07-29 NOTE — Telephone Encounter (Signed)
Patient ok to speak with wife. Advised wife to discuss with Dr. Silvio Pate about BP control.  (informed her that Dr. Caryl Comes is not in the office until next week, but that he also does not follow BP) She is agreeable to discuss with [place].

## 2014-07-29 NOTE — Telephone Encounter (Signed)
Pt c/o BP issue:  1. What are your last 3 BP readings?   169/100 at 11pm 160/91 at 6pm 174/99 at ? (  2. Are you having any other symptoms (ex. Dizziness, headache, blurred vision, passed out)? No  3. What is your medication issue? Pt was taken off of Lisinopril and was placed on Cartizem  Heart rate is fine but BP is fairly high

## 2014-07-31 ENCOUNTER — Encounter: Payer: Self-pay | Admitting: Family Medicine

## 2014-07-31 ENCOUNTER — Ambulatory Visit (INDEPENDENT_AMBULATORY_CARE_PROVIDER_SITE_OTHER): Payer: Medicare HMO | Admitting: Family Medicine

## 2014-07-31 ENCOUNTER — Encounter: Payer: Self-pay | Admitting: Cardiology

## 2014-07-31 VITALS — BP 162/86 | HR 67 | Temp 98.2°F | Ht 68.0 in | Wt 174.8 lb

## 2014-07-31 DIAGNOSIS — I10 Essential (primary) hypertension: Secondary | ICD-10-CM | POA: Diagnosis not present

## 2014-07-31 NOTE — Progress Notes (Signed)
Pre visit review using our clinic review tool, if applicable. No additional management support is needed unless otherwise documented below in the visit note. 

## 2014-07-31 NOTE — Progress Notes (Signed)
Subjective:    Patient ID: Tony Jackson., male    DOB: [birth year], 74 y.o.   MRN: [id number]  HPI Here for increased blood pressure   Recently changed from lisinopril to diltiazem due to a rhythm problem   Was supposed to get blood work  In hospital for high heart rate    Chemistry      Component Value Date/Time   NA 137 07/17/2014 0824   K 4.6 07/17/2014 0824   CL 106 07/17/2014 0824   CO2 18* 07/17/2014 0824   BUN 53* 07/17/2014 0824   CREATININE 2.52* 07/17/2014 0824      Component Value Date/Time   CALCIUM 10.0 07/17/2014 0824   ALKPHOS 40 05/20/2014 0514   AST 22 05/20/2014 0514   ALT 29 05/20/2014 0514   BILITOT 0.8 05/20/2014 0514     thinks he was dehydrated  No prior kidney problems   BP Readings from Last 3 Encounters:  07/31/14 162/86  07/22/14 138/80  07/17/14 109/85    BP at home are even higher than that - as high as 170/100 occ headache  Had a CVA several months ago   Patient Active Problem List   Diagnosis Date Noted  . PSVT (paroxysmal supraventricular tachycardia)   . SVT (supraventricular tachycardia)   . Lung nodules   . Essential hypertension   . Hyperlipidemia   . Cerebral infarction due to embolism of anterior cerebral artery   . Stroke 05/19/2014  . TIA (transient ischemic attack) 05/19/2014  . Sepsis 10/21/2013  . Pulmonary nodules 10/21/2013  . CAD (coronary artery disease) 10/08/2013  . Seborrhea 07/25/2013  . Prostatic intraepithelial neoplasia 04/12/2012  . Actinic keratosis 10/12/2011  . Routine general medical examination at a health care facility 10/07/2010  . NEPHROLITHIASIS, HX OF 03/18/2010  . ERECTILE DYSFUNCTION, ORGANIC 02/19/2008  . Essential hypertension, benign 12/07/2006  . Diabetes 11/13/2006  . HYPERLIPIDEMIA 11/13/2006  . PSORIASIS 11/13/2006   Past Medical History  Diagnosis Date  . Diabetes mellitus   . Hyperlipidemia   . Psoriasis   . Hypertension   . ED (erectile dysfunction)   . History of  nephrolithiasis   . Elevated PSA   . Frequency of urination   . Lung nodule   . CVA (cerebral infarction)   . PSVT (paroxysmal supraventricular tachycardia)    Past Surgical History  Procedure Laterality Date  . Tonsillectomy  1960  . Nm myoview ltd      normal EF 56% 03/08  . Prostate biopsy  01/05/2012    Procedure: BIOPSY TRANSRECTAL ULTRASONIC PROSTATE (TUBP);  Surgeon: [name], MD;  [facility];  Service: Urology;  Laterality: N/A;  . Cataract extraction w/ intraocular lens implant Right 03/30/12    Dr Kathrin Penner  . Tee without cardioversion N/A 05/21/2014    Procedure: TRANSESOPHAGEAL ECHOCARDIOGRAM (TEE);  Surgeon: Jerline Pain, MD;  Location: [facility] ENDOSCOPY;  Service: Cardiovascular;  Laterality: N/A;   History  Substance Use Topics  . Smoking status: Former Smoker -- 1.00 packs/day for 5 years    Types: Cigarettes  . Smokeless tobacco: Current User    Types: Chew     Comment: QUIT SMOKING CIGARETTES 50 YRS AGO--  CHEWED TOBACCO FOR 40 YRS  . Alcohol Use: No   Family History  Problem Relation Age of Onset  . Cancer Mother     breast cancer  . Diabetes Mother   . Heart disease Neg Hx    No Known Allergies Current  Outpatient Prescriptions on File Prior to Visit  Medication Sig Dispense Refill  . aspirin EC 325 MG tablet Take 1 tablet (325 mg total) by mouth daily. 30 tablet 0  . diltiazem (DILACOR XR) 120 MG 24 hr capsule Take 120 mg by mouth daily.  0  . GLIPIZIDE XL 5 MG 24 hr tablet TAKE 1 TABLET DAILY 90 tablet 0  . ketoconazole (NIZORAL) 2 % shampoo Apply 1 application topically 2 (two) times a week. 120 mL 5  . lisinopril (PRINIVIL,ZESTRIL) 10 MG tablet Take 1 tablet (10 mg total) by mouth daily. 90 tablet 1  . metFORMIN (GLUCOPHAGE) 1000 MG tablet TAKE 1 TABLET (1,000 MG TOTAL) BY MOUTH 2 (TWO) TIMES DAILY WITH A MEAL. 60 tablet 8  . mometasone (ELOCON) 0.1 % cream APPLY TO THE AFFECTED AREA TWO TIMES A DAY AS NEEDED (Patient taking  differently: APPLY TO THE AFFECTED AREA TWO TIMES A DAY AS NEEDED FOR IRRITATION) 45 g 0  . sildenafil (VIAGRA) 100 MG tablet Take 50-100 mg by mouth daily as needed for erectile dysfunction.     . triamcinolone cream (KENALOG) 0.1 % Apply 1 application topically 2 (two) times daily as needed. (Patient taking differently: Apply 1 application topically 2 (two) times daily as needed (Irritation). ) 45 g 3   No current facility-administered medications on file prior to visit.     Review of Systems    Review of Systems  Constitutional: Negative for fever, appetite change, fatigue and unexpected weight change.  Eyes: Negative for pain and visual disturbance.  Respiratory: Negative for cough and shortness of breath.   Cardiovascular: Negative for cp or palpitations    Gastrointestinal: Negative for nausea, diarrhea and constipation.  Genitourinary: Negative for urgency and frequency.  Skin: Negative for pallor or rash   Neurological: Negative for weakness, light-headedness, numbness and headaches.  Hematological: Negative for adenopathy. Does not bruise/bleed easily.  Psychiatric/Behavioral: Negative for dysphoric mood. The patient is not nervous/anxious.      Objective:   Physical Exam  Constitutional: He appears well-developed and well-nourished. No distress.  Well appearing elderly male  HENT:  Head: Normocephalic and atraumatic.  Mouth/Throat: Oropharynx is clear and moist.  Eyes: Conjunctivae and EOM are normal. Pupils are equal, round, and reactive to light.  Neck: Normal range of motion. Neck supple. No JVD present. Carotid bruit is not present. No thyromegaly present.  Cardiovascular: Normal rate, regular rhythm, normal heart sounds and intact distal pulses.  Exam reveals no gallop.   Pulmonary/Chest: Effort normal and breath sounds normal. No respiratory distress. He has no wheezes. He has no rales.  No crackles  Abdominal: Soft. Bowel sounds are normal. He exhibits no  distension, no abdominal bruit and no mass. There is no tenderness.  Musculoskeletal: He exhibits no edema.  Lymphadenopathy:    He has no cervical adenopathy.  Neurological: He is alert. He has normal reflexes.  Skin: Skin is warm and dry. No rash noted.  Psychiatric: He has a normal mood and affect.          Assessment & Plan:   Problem List Items Addressed This Visit    Essential hypertension, benign - Primary    BP is up off of ace  Diltiazem working for rate  Rev hx  Lab today to repeat renal panel from hospital (elevated cr- ? Renal insuff/ dehydration) Will re start ace - disc poss side eff and inst to update if dizziness/hypotension/cough or other problems F/u with PCP next week  Relevant Orders   Renal function panel (Completed)

## 2014-07-31 NOTE — Patient Instructions (Signed)
Labs today  Continue diltiazem  Add back the lisinopril to get your blood pressure down (also avoid salt /sodium in diet)  If dizziness or any other side effects let us know  Make sure to drink enough water  Labs today for kidney function  Please schedule a follow up appt with Dr Silvio Pate next week

## 2014-08-01 LAB — RENAL FUNCTION PANEL
Albumin: 4.2 g/dL (ref 3.5–5.2)
BUN: 31 mg/dL — AB (ref 6–23)
CALCIUM: 9.7 mg/dL (ref 8.4–10.5)
CO2: 22 meq/L (ref 19–32)
Chloride: 105 mEq/L (ref 96–112)
Creat: 1.25 mg/dL (ref 0.50–1.35)
Glucose, Bld: 89 mg/dL (ref 70–99)
POTASSIUM: 4.5 meq/L (ref 3.5–5.3)
Phosphorus: 2.7 mg/dL (ref 2.3–4.6)
SODIUM: 145 meq/L (ref 135–145)

## 2014-08-02 NOTE — Assessment & Plan Note (Addendum)
BP is up off of ace  Diltiazem working for rate  Rev hx  Lab today to repeat renal panel from hospital (elevated cr- ? Renal insuff/ dehydration) Will re start ace - disc poss side eff and inst to update if dizziness/hypotension/cough or other problems F/u with PCP next week

## 2014-08-03 ENCOUNTER — Ambulatory Visit: Payer: Medicare HMO | Admitting: Family Medicine

## 2014-08-04 ENCOUNTER — Ambulatory Visit (INDEPENDENT_AMBULATORY_CARE_PROVIDER_SITE_OTHER): Payer: Medicare HMO | Admitting: Internal Medicine

## 2014-08-04 ENCOUNTER — Encounter: Payer: Self-pay | Admitting: Internal Medicine

## 2014-08-04 VITALS — BP 138/80 | HR 70 | Temp 98.1°F | Wt 175.0 lb

## 2014-08-04 DIAGNOSIS — I471 Supraventricular tachycardia: Secondary | ICD-10-CM

## 2014-08-04 DIAGNOSIS — I10 Essential (primary) hypertension: Secondary | ICD-10-CM

## 2014-08-04 NOTE — Assessment & Plan Note (Signed)
No apparent recurrence 

## 2014-08-04 NOTE — Progress Notes (Signed)
Subjective:    Patient ID: Tony Jackson., male    DOB: 06/26/1940, 74 y.o.   MRN: [id number]  HPI Here for follow up of BP Did restart the lisinopril 4 days ago or so  He is still getting high readings at home 170/93, 171/104 Wife uses machine and numbers are normal  No headache No chest pain No SOB No palpitations  Current Outpatient Prescriptions on File Prior to Visit  Medication Sig Dispense Refill  . aspirin EC 325 MG tablet Take 1 tablet (325 mg total) by mouth daily. 30 tablet 0  . diltiazem (DILACOR XR) 120 MG 24 hr capsule Take 120 mg by mouth daily.  0  . GLIPIZIDE XL 5 MG 24 hr tablet TAKE 1 TABLET DAILY 90 tablet 0  . ketoconazole (NIZORAL) 2 % shampoo Apply 1 application topically 2 (two) times a week. 120 mL 5  . lisinopril (PRINIVIL,ZESTRIL) 10 MG tablet Take 1 tablet (10 mg total) by mouth daily. 90 tablet 1  . metFORMIN (GLUCOPHAGE) 1000 MG tablet TAKE 1 TABLET (1,000 MG TOTAL) BY MOUTH 2 (TWO) TIMES DAILY WITH A MEAL. 60 tablet 8  . sildenafil (VIAGRA) 100 MG tablet Take 50-100 mg by mouth daily as needed for erectile dysfunction.      No current facility-administered medications on file prior to visit.    No Known Allergies  Past Medical History  Diagnosis Date  . Diabetes mellitus   . Hyperlipidemia   . Psoriasis   . Hypertension   . ED (erectile dysfunction)   . History of nephrolithiasis   . Elevated PSA   . Frequency of urination   . Lung nodule   . CVA (cerebral infarction)   . PSVT (paroxysmal supraventricular tachycardia)     Past Surgical History  Procedure Laterality Date  . Tonsillectomy  1960  . Nm myoview ltd      normal EF 56% 03/08  . Prostate biopsy  01/05/2012    Procedure: BIOPSY TRANSRECTAL ULTRASONIC PROSTATE (TUBP);  Surgeon: [name], MD;  Location: [facility];  Service: Urology;  Laterality: N/A;  . Cataract extraction w/ intraocular lens implant Right 03/30/12    Dr Kathrin Penner  . Tee without  cardioversion N/A 05/21/2014    Procedure: TRANSESOPHAGEAL ECHOCARDIOGRAM (TEE);  Surgeon: Jerline Pain, MD;  Location: [facility] ENDOSCOPY;  Service: Cardiovascular;  Laterality: N/A;    Family History  Problem Relation Age of Onset  . Cancer Mother     breast cancer  . Diabetes Mother   . Heart disease Neg Hx     History   Social History  . Marital Status: Married    Spouse Name: N/A  . Number of Children: 2  . Years of Education: N/A   Occupational History  . Architect- paving    Social History Main Topics  . Smoking status: Former Smoker -- 1.00 packs/day for 5 years    Types: Cigarettes  . Smokeless tobacco: Current User    Types: Chew     Comment: QUIT SMOKING CIGARETTES 50 YRS AGO--  CHEWED TOBACCO FOR 40 YRS  . Alcohol Use: No  . Drug Use: No  . Sexual Activity: Not Currently   Other Topics Concern  . Not on file   Social History Narrative   No living will   Requests wife as health care POA.   Would accept resuscitation attempts   Not sure about tube feeds   Review of Systems Appetite is okay Sleeping okay  Objective:   Physical Exam  Constitutional: He appears well-developed and well-nourished. No distress.  Neck: Normal range of motion. Neck supple.  Cardiovascular: Normal rate, regular rhythm and normal heart sounds.  Exam reveals no gallop.   No murmur heard. Pulmonary/Chest: Effort normal and breath sounds normal. No respiratory distress. He has no wheezes. He has no rales.  Musculoskeletal: He exhibits no edema.  Lymphadenopathy:    He has no cervical adenopathy.          Assessment & Plan:

## 2014-08-04 NOTE — Patient Instructions (Signed)
Please bring your cuff in to the upcoming cardiology appointment so we can see if it is working correctly.

## 2014-08-04 NOTE — Assessment & Plan Note (Signed)
BP Readings from Last 3 Encounters:  08/04/14 138/80  07/31/14 162/86  07/22/14 138/80   Repeat on right 150/84  Puzzling that he is getting such higher numbers at home He forgot to bring his cuff in to correlate---he will bring into the cardiologist I am reluctant to further increase lisinopril since he just restarted---if he is still up in 2 weeks at cardiology, should probably increase to 20

## 2014-08-04 NOTE — Progress Notes (Signed)
Pre visit review using our clinic review tool, if applicable. No additional management support is needed unless otherwise documented below in the visit note. 

## 2014-08-05 ENCOUNTER — Encounter: Payer: Self-pay | Admitting: Cardiology

## 2014-08-05 ENCOUNTER — Encounter: Payer: Self-pay | Admitting: Internal Medicine

## 2014-08-12 ENCOUNTER — Encounter: Payer: Self-pay | Admitting: Cardiology

## 2014-08-15 NOTE — Progress Notes (Signed)
Electrophysiology Office Note Date: 08/17/2014  ID:  Tony Jackson., DOB [birth year], MRN [id number]  PCP: Viviana Simpler, MD Electrophysiologist: Rayann Heman  CC: SVT follow up  Tony Jackson. is a 74 y.o. male seen today for Dr Rayann Heman.  He was seen in the ER 06/2013 for increasing weakness and pre-syncope and was found to be in SVT that was terminated with adenosine.  He presents today for follow-up.  Since discharge, the patient reports doing very well.  He denies chest pain, palpitations, dyspnea, PND, orthopnea, nausea, vomiting, dizziness, syncope.  Past Medical History  Diagnosis Date  . Diabetes mellitus   . Hyperlipidemia   . Psoriasis   . Hypertension   . ED (erectile dysfunction)   . History of nephrolithiasis   . Elevated PSA   . Frequency of urination   . Lung nodule   . CVA (cerebral infarction)   . PSVT (paroxysmal supraventricular tachycardia)    Past Surgical History  Procedure Laterality Date  . Tonsillectomy  1960  . Nm myoview ltd      normal EF 56% 03/08  . Prostate biopsy  01/05/2012    Procedure: BIOPSY TRANSRECTAL ULTRASONIC PROSTATE (TUBP);  Surgeon: [name], MD;  Location: [facility];  Service: Urology;  Laterality: N/A;  . Cataract extraction w/ intraocular lens implant Right 03/30/12    Dr Kathrin Penner  . Tee without cardioversion N/A 05/21/2014    Procedure: TRANSESOPHAGEAL ECHOCARDIOGRAM (TEE);  Surgeon: Jerline Pain, MD;  Location: [facility] ENDOSCOPY;  Service: Cardiovascular;  Laterality: N/A;    Current Outpatient Prescriptions  Medication Sig Dispense Refill  . aspirin EC 325 MG tablet Take 1 tablet (325 mg total) by mouth daily. 30 tablet 0  . diltiazem (DILACOR XR) 180 MG 24 hr capsule Take 1 capsule (180 mg total) by mouth daily. 30 capsule 11  . glipiZIDE (GLUCOTROL XL) 5 MG 24 hr tablet Take 5 mg by mouth daily with breakfast.    . ketoconazole (NIZORAL) 2 % shampoo Apply 1 application topically 2 (two) times a week.  120 mL 5  . lisinopril (PRINIVIL,ZESTRIL) 10 MG tablet Take 1 tablet (10 mg total) by mouth daily. 90 tablet 1  . metFORMIN (GLUCOPHAGE) 1000 MG tablet TAKE 1 TABLET (1,000 MG TOTAL) BY MOUTH 2 (TWO) TIMES DAILY WITH A MEAL. 60 tablet 8  . mometasone (ELOCON) 0.1 % cream Apply 1 application topically 2 (two) times daily as needed (skin).     . sildenafil (VIAGRA) 100 MG tablet Take 50-100 mg by mouth daily as needed for erectile dysfunction.     . triamcinolone cream (KENALOG) 0.1 % Apply 1 application topically 2 (two) times daily.     No current facility-administered medications for this visit.    Allergies:   Review of patient's allergies indicates no known allergies.   Social History: History   Social History  . Marital Status: Married    Spouse Name: N/A  . Number of Children: 2  . Years of Education: N/A   Occupational History  . Architect- paving    Social History Main Topics  . Smoking status: Former Smoker -- 1.00 packs/day for 5 years    Types: Cigarettes  . Smokeless tobacco: Current User    Types: Chew     Comment: QUIT SMOKING CIGARETTES 50 YRS AGO--  CHEWED TOBACCO FOR 40 YRS  . Alcohol Use: No  . Drug Use: No  . Sexual Activity: Not Currently   Other Topics Concern  .  Not on file   Social History Narrative   No living will   Requests wife as health care POA.   Would accept resuscitation attempts   Not sure about tube feeds    Family History: Family History  Problem Relation Age of Onset  . Cancer Mother     breast cancer  . Diabetes Mother   . Heart disease Neg Hx     Review of Systems: All other systems reviewed and are otherwise negative except as noted above.   Physical Exam: VS:  BP 140/84 mmHg  Pulse 73  Ht 5\' 9"  (1.753 m)  Wt 173 lb 6.4 oz (78.654 kg)  BMI 25.60 kg/m2 , BMI Body mass index is 25.6 kg/(m^2). Wt Readings from Last 3 Encounters:  08/17/14 173 lb 6.4 oz (78.654 kg)  08/04/14 175 lb (79.379 kg)  07/31/14 174 lb 12  oz (79.266 kg)    GEN- The patient is well appearing, alert and oriented x 3 today.   HEENT: normocephalic, atraumatic; sclera clear, conjunctiva pink; hearing intact; oropharynx clear; neck supple  Lungs- Clear to ausculation bilaterally, normal work of breathing.  No wheezes, rales, rhonchi Heart- Regular rate and rhythm, no murmurs, rubs or gallops  GI- soft, non-tender, non-distended, bowel sounds present  Extremities- no clubbing, cyanosis, or edema; DP/PT/radial pulses 2+ bilaterally MS- no significant deformity or atrophy Skin- warm and dry, no rash or lesion  Psych- euthymic mood, full affect Neuro- strength and sensation are intact   EKG:  EKG is ordered today. The ekg ordered today shows sinus rhythm, 1st degree AV block (PR 214), non specific STT changes  Recent Labs: 05/20/2014: ALT 29; TSH 2.955 07/17/2014: Hemoglobin 15.8; Platelets 221 07/31/2014: BUN 31*; Creat 1.25; Potassium 4.5; Sodium 145    Other studies Reviewed: Additional studies/ records that were reviewed today include: ER records  Assessment and Plan: 1.  SVT The patient has adenosine sensitive short RP tachycardia at 160bpm most consistent with AVNRT.  He has had 2 evaluations in the hospital for weakness and pre-syncope which correspond with his SVT for which he is otherwise asymptomatic.  Treatment options including ablation were discussed with the patient and his wife today.  Risks, benefits were reviewed and he would like to proceed.  Will schedule at the next available time with Dr Rayann Heman with anesthesia  Increase Diltiazem to 180mg  daily in the meantime - hold for 48 hours prior to procedure  2.  Cryptogenic stroke No AF detected on LINQ Continue long term remote monitoring - reinforced need to keep monitor plugged in and transmitting today  Continue ASA 325mg  daily, statin  3.  HTN Improved - diltiazem increased today as above   Current medicines are reviewed at length with the patient today.     The patient does not have concerns regarding his medicines.  The following changes were made today:  Increase Diltiazem to 180mg  daily today  Labs/ tests ordered today include: none   Disposition:   Follow up with Dr Rayann Heman after ablation    Signed, Chanetta Marshall, NP 08/17/2014 10:09 AM   [facility] [address] [place] [address] [phone] (office) [phone] (fax)

## 2014-08-17 ENCOUNTER — Other Ambulatory Visit: Payer: Self-pay

## 2014-08-17 ENCOUNTER — Encounter: Payer: Self-pay | Admitting: *Deleted

## 2014-08-17 ENCOUNTER — Ambulatory Visit (INDEPENDENT_AMBULATORY_CARE_PROVIDER_SITE_OTHER): Payer: Medicare HMO | Admitting: Nurse Practitioner

## 2014-08-17 ENCOUNTER — Encounter: Payer: Self-pay | Admitting: Nurse Practitioner

## 2014-08-17 ENCOUNTER — Other Ambulatory Visit: Payer: Self-pay | Admitting: Internal Medicine

## 2014-08-17 VITALS — BP 140/84 | HR 73 | Ht 69.0 in | Wt 173.4 lb

## 2014-08-17 DIAGNOSIS — Z01812 Encounter for preprocedural laboratory examination: Secondary | ICD-10-CM

## 2014-08-17 DIAGNOSIS — I10 Essential (primary) hypertension: Secondary | ICD-10-CM

## 2014-08-17 DIAGNOSIS — I639 Cerebral infarction, unspecified: Secondary | ICD-10-CM

## 2014-08-17 DIAGNOSIS — I471 Supraventricular tachycardia, unspecified: Secondary | ICD-10-CM

## 2014-08-17 LAB — CUP PACEART INCLINIC DEVICE CHECK: MDC IDC SESS DTM: 20160706123121

## 2014-08-17 MED ORDER — DILTIAZEM HCL ER 180 MG PO CP24: 120.0000 mg | ORAL_CAPSULE | Freq: Every day | ORAL | Status: DC

## 2014-08-17 NOTE — Patient Instructions (Signed)
Medication Instructions:  INCREASE DILTIAZEM TO  180 MG   Labwork: AUG  1  OR  FEW  DAYS  BEFORE BMET  CBC  AND  INR  Testing/Procedures: Your physician has recommended that you have an ablation. Catheter ablation is a medical procedure used to treat some cardiac arrhythmias (irregular heartbeats). During catheter ablation, a long, thin, flexible tube is put into a blood vessel in your groin (upper thigh), or neck. This tube is called an ablation catheter. It is then guided to your heart through the blood vessel. Radio frequency waves destroy small areas of heart tissue where abnormal heartbeats may cause an arrhythmia to start. Please see the instruction sheet given to you today.   Follow-Up: AFTER  PROCEDURE   Any Other Special Instructions Will Be Listed Below (If Applicable).

## 2014-08-18 ENCOUNTER — Encounter: Payer: Self-pay | Admitting: Internal Medicine

## 2014-08-19 ENCOUNTER — Ambulatory Visit (INDEPENDENT_AMBULATORY_CARE_PROVIDER_SITE_OTHER): Payer: Medicare HMO | Admitting: *Deleted

## 2014-08-19 ENCOUNTER — Encounter: Payer: Self-pay | Admitting: Internal Medicine

## 2014-08-19 DIAGNOSIS — I639 Cerebral infarction, unspecified: Secondary | ICD-10-CM | POA: Diagnosis not present

## 2014-08-19 DIAGNOSIS — I10 Essential (primary) hypertension: Secondary | ICD-10-CM | POA: Diagnosis not present

## 2014-08-20 NOTE — Progress Notes (Signed)
Loop recorder 

## 2014-08-25 ENCOUNTER — Ambulatory Visit (INDEPENDENT_AMBULATORY_CARE_PROVIDER_SITE_OTHER): Payer: Medicare HMO | Admitting: Neurology

## 2014-08-25 ENCOUNTER — Encounter: Payer: Self-pay | Admitting: Neurology

## 2014-08-25 VITALS — BP 177/89 | HR 66 | Ht 69.0 in | Wt 173.0 lb

## 2014-08-25 DIAGNOSIS — I639 Cerebral infarction, unspecified: Secondary | ICD-10-CM

## 2014-08-25 NOTE — Progress Notes (Signed)
[facility] [address] [place]. [place] [address] [phone]       OFFICE FOLLOW-UP NOTE  [name]. Date of Birth:  1940/06/27 Medical Record Number:  [id number]   HPI: 74 year old male seen today for the first office follow-up visit for hospital admission for stroke on 05/19/14. 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 He stumbled and fell a couple of times. He presented beyond time window for intervention. MRI scan of the brain showed a small acute right frontal MCA branch infarct. MRA of the brain showed focal mid to high-grade stenosis of proximal right M2 branch but without large vessel occlusion. Echocardiogram showed normal ejection fraction. TEE showed no cardiac source of embolism or PFO. Carotid ultrasound showed no significant extra cranial stenosis. Urine drug screen was negative. Hemoglobin A1c was elevated at 7.3. LDL cholesterol was 71, HDL 41, triglycerides were elevated at 279 and total cholesterol was 168 mg percent. Patient was started on aspirin for stroke prevention and had a loop recorder inserted. He states he is had significant improvement in his left-sided strength and has only minor diminished fine motor skills in his left hand and does occasionally get tired. He was readmitted a few weeks later with supraventricular tachycardia and has been started on diltiazem. He has not been found to have atrial fibrillation on loop recorder monitor yet .He has an upcoming appointment for electrical physiological testing but has concerns about that. He states his blood pressure has been slightly elevated since his blood pressure medicine was changed to diltiazem. He is tolerating aspirin well without bleeding or bruising. He states his cholesterol has been under good control as have been her sugars.  ROS:   14 system review of systems is positive for  memory loss,  dizziness and all other systems negative  PMH:  Past Medical History  Diagnosis Date  . Diabetes mellitus   . Hyperlipidemia   . Psoriasis   . Hypertension   . ED (erectile dysfunction)   . History of nephrolithiasis   . Elevated PSA   . Frequency of urination   . Lung nodule   . CVA (cerebral infarction)   . PSVT (paroxysmal supraventricular tachycardia)          Social History:  History   Social History  . Marital Status: Married    Spouse Name: Peter Congo  . Number of Children: 2  . Years of Education: HS   Occupational History  . Architect- paving    Social History Main Topics  . Smoking status: Former Smoker -- 1.00 packs/day for 5 years    Types: Cigarettes  . Smokeless tobacco: Current User    Types: Chew     Comment: QUIT SMOKING CIGARETTES 50 YRS AGO--  CHEWED TOBACCO FOR 40 YRS  . Alcohol Use: No  . Drug Use: No  . Sexual Activity: Not Currently   Other Topics Concern  . Not on file   Social History Narrative   No living will   Requests wife as health care POA.   Would accept resuscitation attempts   Not sure about tube feeds   Caffeine Use    Medications:   Current Outpatient Prescriptions on File Prior to Visit  Medication Sig Dispense Refill  . aspirin EC 325 MG tablet Take 1 tablet (325 mg total) by mouth daily. 30 tablet 0  . diltiazem (DILACOR XR) 180 MG 24  hr capsule Take 1 capsule (180 mg total) by mouth daily. 30 capsule 11  . glipiZIDE (GLUCOTROL XL) 5 MG 24 hr tablet Take 5 mg by mouth daily with breakfast.    . ketoconazole (NIZORAL) 2 % shampoo Apply 1 application topically 2 (two) times a week. 120 mL 5  . lisinopril (PRINIVIL,ZESTRIL) 10 MG tablet Take 1 tablet (10 mg total) by mouth daily. 90 tablet 1  . metFORMIN (GLUCOPHAGE) 1000 MG tablet TAKE 1 TABLET (1,000 MG TOTAL) BY MOUTH 2 (TWO) TIMES DAILY WITH A MEAL. 60 tablet 8  . mometasone (ELOCON) 0.1 % cream Apply 1 application topically 2 (two) times daily as needed (skin).       . sildenafil (VIAGRA) 100 MG tablet Take 50-100 mg by mouth daily as needed for erectile dysfunction.     . triamcinolone cream (KENALOG) 0.1 % Apply 1 application topically 2 (two) times daily.     No current facility-administered medications on file prior to visit.    Allergies:  No Known Allergies  Physical Exam General: well developed, well nourished, seated, in no evident distress Head: head normocephalic and atraumatic.  Neck: supple with no carotid or supraclavicular bruits Cardiovascular: regular rate and rhythm, no murmurs Musculoskeletal: no deformity Skin:  no rash/petichiae Vascular:  Normal pulses all extremities Filed Vitals:   08/25/14 0907  BP: 177/89  Pulse: 66   Neurologic Exam Mental Status: Awake and fully alert. Oriented to place and time. Recent and remote memory intact. Attention span, concentration and fund of knowledge appropriate. Mood and affect appropriate. Mini-Mental status exam scored 27/30 with deficits in attention, following three-step commands. Animal fluency test 14. Cranial Nerves: Fundoscopic exam reveals sharp disc margins. Pupils equal, briskly reactive to light. Extraocular movements full without nystagmus. Visual fields full to confrontation. Hearing intact. Facial sensation intact. Face, tongue, palate moves normally and symmetrically.  Motor: Normal bulk and tone. Normal strength in all tested extremity muscles. Diminished fine finger movements on the left. Orbits right over left upper extremity. Sensory.: intact to touch ,pinprick .position and vibratory sensation.  Coordination: Rapid alternating movements normal in all extremities. Finger-to-nose and heel-to-shin performed accurately bilaterally. Gait and Station: Arises from chair without difficulty. Stance is normal. Gait demonstrates normal stride length and balance . Able to heel, toe and tandem walk without difficulty.  Reflexes: 1+ and symmetric. Toes downgoing.   NIHSS  0 Modified  Rankin  1   ASSESSMENT: 89 year Caucasian male with right middle cerebral artery branch embolic infarct likely of cryptogenic etiology in March 2016 was doing quite well. Vascular risk factors of hypertension, hyperlipidemia, diabetes and remote smoking. Recent diagnosis of supraventricular tachycardia but atrial fibrillation has not yet been found    PLAN: I had a long d/w patient about his recent stroke, risk for recurrent stroke/TIAs, personally independently reviewed imaging studies and stroke evaluation results and answered questions.Continue aspirin 325 mg orally every day  for secondary stroke prevention and maintain strict control of hypertension with blood pressure goal below 130/90, diabetes with hemoglobin A1c goal below 6.5% and lipids with LDL cholesterol goal below 70 mg/dL. I also advised the patient to eat a healthy diet with plenty of whole grains, cereals, fruits and vegetables, exercise regularly and maintain ideal body weight. He may also consider possible participation in the [place] trial if interested. He was given written information to review at home. Followup in the future with me in  6 months or call earlier if necessary.  Antony Contras, MD  Note: This document was prepared with digital dictation and possible smart phrase technology. Any transcriptional errors that result from this process are unintentional

## 2014-08-25 NOTE — Patient Instructions (Signed)
I had a long d/w patient about his recent stroke, risk for recurrent stroke/TIAs, personally independently reviewed imaging studies and stroke evaluation results and answered questions.Continue aspirin 325 mg orally every day  for secondary stroke prevention and maintain strict control of hypertension with blood pressure goal below 130/90, diabetes with hemoglobin A1c goal below 6.5% and lipids with LDL cholesterol goal below 70 mg/dL. I also advised the patient to eat a healthy diet with plenty of whole grains, cereals, fruits and vegetables, exercise regularly and maintain ideal body weight. He may also consider possible participation in the [place] trial if interested. He was given written information to review at home. Followup in the future with me in  6 months or call earlier if necessary. Stroke Prevention Some medical conditions and behaviors are associated with an increased chance of having a stroke. You may prevent a stroke by making healthy choices and managing medical conditions. HOW CAN I REDUCE MY RISK OF HAVING A STROKE?   Stay physically active. Get at least 30 minutes of activity on most or all days.  Do not smoke. It may also be helpful to avoid exposure to secondhand smoke.  Limit alcohol use. Moderate alcohol use is considered to be:  No more than 2 drinks per day for men.  No more than 1 drink per day for nonpregnant women.  Eat healthy foods. This involves:  Eating 5 or more servings of fruits and vegetables a day.  Making dietary changes that address high blood pressure (hypertension), high cholesterol, diabetes, or obesity.  Manage your cholesterol levels.  Making food choices that are high in fiber and low in saturated fat, trans fat, and cholesterol may control cholesterol levels.  Take any prescribed medicines to control cholesterol as directed by your health care provider.  Manage your diabetes.  Controlling your carbohydrate and sugar intake is  recommended to manage diabetes.  Take any prescribed medicines to control diabetes as directed by your health care provider.  Control your hypertension.  Making food choices that are low in salt (sodium), saturated fat, trans fat, and cholesterol is recommended to manage hypertension.  Take any prescribed medicines to control hypertension as directed by your health care provider.  Maintain a healthy weight.  Reducing calorie intake and making food choices that are low in sodium, saturated fat, trans fat, and cholesterol are recommended to manage weight.  Stop drug abuse.  Avoid taking birth control pills.  Talk to your health care provider about the risks of taking birth control pills if you are over 2 years old, smoke, get migraines, or have ever had a blood clot.  Get evaluated for sleep disorders (sleep apnea).  Talk to your health care provider about getting a sleep evaluation if you snore a lot or have excessive sleepiness.  Take medicines only as directed by your health care provider.  For some people, aspirin or blood thinners (anticoagulants) are helpful in reducing the risk of forming abnormal blood clots that can lead to stroke. If you have the irregular heart rhythm of atrial fibrillation, you should be on a blood thinner unless there is a good reason you cannot take them.  Understand all your medicine instructions.  Make sure that other conditions (such as anemia or atherosclerosis) are addressed. SEEK IMMEDIATE MEDICAL CARE IF:   You have sudden weakness or numbness of the face, arm, or leg, especially on one side of the body.  Your face or eyelid droops to one side.  You have sudden  confusion.  You have trouble speaking (aphasia) or understanding.  You have sudden trouble seeing in one or both eyes.  You have sudden trouble walking.  You have dizziness.  You have a loss of balance or coordination.  You have a sudden, severe headache with no known  cause.  You have new chest pain or an irregular heartbeat. Any of these symptoms may represent a serious problem that is an emergency. Do not wait to see if the symptoms will go away. Get medical help at once. Call your local emergency services (911 in [place] Do not drive yourself to the hospital. Document Released: 03/16/2004 Document Revised: 06/23/2013 Document Reviewed: 08/09/2012 [facility] Patient Information 2015 [place], [place]. This information is not intended to replace advice given to you by your health care provider. Make sure you discuss any questions you have with your health care provider.

## 2014-08-26 ENCOUNTER — Encounter: Payer: Self-pay | Admitting: Internal Medicine

## 2014-08-27 ENCOUNTER — Encounter: Payer: Self-pay | Admitting: Cardiology

## 2014-08-27 LAB — CUP PACEART REMOTE DEVICE CHECK: Date Time Interrogation Session: 20160707155326

## 2014-08-31 ENCOUNTER — Other Ambulatory Visit: Payer: Self-pay | Admitting: *Deleted

## 2014-08-31 MED ORDER — GLIPIZIDE ER 5 MG PO TB24: 5.0000 mg | ORAL_TABLET | Freq: Every day | ORAL | Status: DC

## 2014-09-03 ENCOUNTER — Encounter: Payer: Self-pay | Admitting: Internal Medicine

## 2014-09-03 ENCOUNTER — Ambulatory Visit (INDEPENDENT_AMBULATORY_CARE_PROVIDER_SITE_OTHER): Payer: Medicare HMO | Admitting: Internal Medicine

## 2014-09-03 VITALS — BP 130/86 | HR 73 | Temp 98.7°F | Wt 172.8 lb

## 2014-09-03 DIAGNOSIS — I10 Essential (primary) hypertension: Secondary | ICD-10-CM | POA: Diagnosis not present

## 2014-09-03 MED ORDER — TRIAMCINOLONE ACETONIDE 0.1 % EX CREA: 1.0000 "application " | TOPICAL_CREAM | Freq: Two times a day (BID) | CUTANEOUS | Status: DC | PRN

## 2014-09-03 MED ORDER — LISINOPRIL 20 MG PO TABS: 20.0000 mg | ORAL_TABLET | Freq: Every day | ORAL | Status: DC

## 2014-09-03 NOTE — Progress Notes (Signed)
Subjective:    Patient ID: Tony Jackson., male    DOB: [birth year], 74 y.o.   MRN: [id number]  HPI Here for follow up of BP Stopped taking it at home No problems with medications  Due for ablation of PSVT circuit next month Discussed that this is a good idea to proceed  No chest pain No SOB--but fairly sedentary at this point  Current Outpatient Prescriptions on File Prior to Visit  Medication Sig Dispense Refill  . aspirin EC 325 MG tablet Take 1 tablet (325 mg total) by mouth daily. 30 tablet 0  . diltiazem (DILACOR XR) 180 MG 24 hr capsule Take 1 capsule (180 mg total) by mouth daily. 30 capsule 11  . glipiZIDE (GLUCOTROL XL) 5 MG 24 hr tablet Take 1 tablet (5 mg total) by mouth daily with breakfast. 90 tablet 3  . ketoconazole (NIZORAL) 2 % shampoo Apply 1 application topically 2 (two) times a week. 120 mL 5  . lisinopril (PRINIVIL,ZESTRIL) 10 MG tablet Take 1 tablet (10 mg total) by mouth daily. 90 tablet 1  . metFORMIN (GLUCOPHAGE) 1000 MG tablet TAKE 1 TABLET (1,000 MG TOTAL) BY MOUTH 2 (TWO) TIMES DAILY WITH A MEAL. 60 tablet 8  . mometasone (ELOCON) 0.1 % cream Apply 1 application topically 2 (two) times daily as needed (skin).     . sildenafil (VIAGRA) 100 MG tablet Take 50-100 mg by mouth daily as needed for erectile dysfunction.     . simvastatin (ZOCOR) 10 MG tablet Take 10 mg by mouth daily.    Marland Kitchen triamcinolone cream (KENALOG) 0.1 % Apply 1 application topically 2 (two) times daily.     No current facility-administered medications on file prior to visit.    No Known Allergies  Past Medical History  Diagnosis Date  . Diabetes mellitus   . Hyperlipidemia   . Psoriasis   . Hypertension   . ED (erectile dysfunction)   . History of nephrolithiasis   . Elevated PSA   . Frequency of urination   . Lung nodule   . CVA (cerebral infarction)   . PSVT (paroxysmal supraventricular tachycardia)   . Atrial fibrillation     Past Surgical History  Procedure  Laterality Date  . Tonsillectomy  1960  . Nm myoview ltd      normal EF 56% 03/08  . Prostate biopsy  01/05/2012    Procedure: BIOPSY TRANSRECTAL ULTRASONIC PROSTATE (TUBP);  Surgeon: [name], MD;  [facility]: [facility];  Service: Urology;  Laterality: N/A;  . Cataract extraction w/ intraocular lens implant Right 03/30/12    Dr Kathrin Penner  . Tee without cardioversion N/A 05/21/2014    Procedure: TRANSESOPHAGEAL ECHOCARDIOGRAM (TEE);  Surgeon: Jerline Pain, MD;  Location: [facility] ENDOSCOPY;  Service: Cardiovascular;  Laterality: N/A;    Family History  Problem Relation Age of Onset  . Cancer Mother     breast cancer  . Diabetes Mother   . Heart disease Neg Hx     History   Social History  . Marital Status: Married    Spouse Name: Peter Congo  . Number of Children: 2  . Years of Education: HS   Occupational History  . Architect- paving    Social History Main Topics  . Smoking status: Former Smoker -- 1.00 packs/day for 5 years    Types: Cigarettes  . Smokeless tobacco: Current User    Types: Chew     Comment: QUIT SMOKING CIGARETTES 50 YRS AGO--  CHEWED TOBACCO  FOR 40 YRS  . Alcohol Use: No  . Drug Use: No  . Sexual Activity: Not Currently   Other Topics Concern  . Not on file   Social History Narrative   No living will   Requests wife as health care POA.   Would accept resuscitation attempts   Not sure about tube feeds   Caffeine Use   Review of Systems Occasional headaches--clears if he holds his head back Appetite is fine Sleeps well    Objective:   Physical Exam  Constitutional: He appears well-developed and well-nourished. No distress.  Neck: Normal range of motion. Neck supple. No thyromegaly present.  Cardiovascular: Normal rate, regular rhythm and normal heart sounds.  Exam reveals no gallop.   No murmur heard. Pulmonary/Chest: Effort normal and breath sounds normal. No respiratory distress. He has no wheezes. He has no rales.    Musculoskeletal: He exhibits no edema.  Lymphadenopathy:    He has no cervical adenopathy.          Assessment & Plan:

## 2014-09-03 NOTE — Assessment & Plan Note (Signed)
BP Readings from Last 3 Encounters:  09/03/14 130/86  08/25/14 177/89  08/17/14 140/84   Repeat on right 142/80 He is very labile I believe Will increase the lisinopril to 20---esp given recent SPRINT results He can get it rechecked when at his ablation in ~3 weeks

## 2014-09-03 NOTE — Progress Notes (Signed)
Pre visit review using our clinic review tool, if applicable. No additional management support is needed unless otherwise documented below in the visit note. 

## 2014-09-08 ENCOUNTER — Encounter: Payer: Self-pay | Admitting: Internal Medicine

## 2014-09-09 ENCOUNTER — Encounter: Payer: Self-pay | Admitting: Internal Medicine

## 2014-09-11 ENCOUNTER — Encounter: Payer: Self-pay | Admitting: Internal Medicine

## 2014-09-14 ENCOUNTER — Telehealth: Payer: Self-pay | Admitting: Internal Medicine

## 2014-09-14 NOTE — Telephone Encounter (Signed)
Spoke with Mrs. Brosnahan and answered her questions.  He husband is nervous in regards to the ablation.  I tried to reassure her

## 2014-09-14 NOTE — Telephone Encounter (Signed)
New message    Wife calling has some questions regarding  Upcoming  ablation.

## 2014-09-18 ENCOUNTER — Ambulatory Visit (INDEPENDENT_AMBULATORY_CARE_PROVIDER_SITE_OTHER): Payer: Medicare HMO | Admitting: *Deleted

## 2014-09-18 ENCOUNTER — Encounter: Payer: Self-pay | Admitting: Internal Medicine

## 2014-09-18 DIAGNOSIS — I639 Cerebral infarction, unspecified: Secondary | ICD-10-CM | POA: Diagnosis not present

## 2014-09-21 ENCOUNTER — Other Ambulatory Visit (INDEPENDENT_AMBULATORY_CARE_PROVIDER_SITE_OTHER): Payer: Medicare HMO | Admitting: *Deleted

## 2014-09-21 DIAGNOSIS — Z5181 Encounter for therapeutic drug level monitoring: Secondary | ICD-10-CM | POA: Diagnosis not present

## 2014-09-21 DIAGNOSIS — I10 Essential (primary) hypertension: Secondary | ICD-10-CM | POA: Diagnosis not present

## 2014-09-21 DIAGNOSIS — Z01812 Encounter for preprocedural laboratory examination: Secondary | ICD-10-CM | POA: Diagnosis not present

## 2014-09-21 LAB — CBC WITH DIFFERENTIAL/PLATELET
BASOS ABS: 0.1 10*3/uL (ref 0.0–0.1)
Basophils Relative: 0.9 % (ref 0.0–3.0)
EOS PCT: 1.8 % (ref 0.0–5.0)
Eosinophils Absolute: 0.1 10*3/uL (ref 0.0–0.7)
HEMATOCRIT: 39.2 % (ref 39.0–52.0)
HEMOGLOBIN: 13.3 g/dL (ref 13.0–17.0)
LYMPHS PCT: 15.9 % (ref 12.0–46.0)
Lymphs Abs: 1 10*3/uL (ref 0.7–4.0)
MCHC: 34.1 g/dL (ref 30.0–36.0)
MCV: 90.3 fl (ref 78.0–100.0)
Monocytes Absolute: 0.5 10*3/uL (ref 0.1–1.0)
Monocytes Relative: 8.6 % (ref 3.0–12.0)
Neutro Abs: 4.5 10*3/uL (ref 1.4–7.7)
Neutrophils Relative %: 72.8 % (ref 43.0–77.0)
Platelets: 184 10*3/uL (ref 150.0–400.0)
RBC: 4.34 Mil/uL (ref 4.22–5.81)
RDW: 13.5 % (ref 11.5–15.5)
WBC: 6.2 10*3/uL (ref 4.0–10.5)

## 2014-09-21 LAB — BASIC METABOLIC PANEL WITH GFR
BUN: 25 mg/dL — ABNORMAL HIGH (ref 6–23)
CO2: 25 meq/L (ref 19–32)
Calcium: 10.1 mg/dL (ref 8.4–10.5)
Chloride: 106 meq/L (ref 96–112)
Creatinine, Ser: 1.3 mg/dL (ref 0.40–1.50)
GFR: 57.39 mL/min — ABNORMAL LOW
Glucose, Bld: 164 mg/dL — ABNORMAL HIGH (ref 70–99)
Potassium: 4.5 meq/L (ref 3.5–5.1)
Sodium: 139 meq/L (ref 135–145)

## 2014-09-21 LAB — PROTIME-INR
INR: 1 ratio (ref 0.8–1.0)
PROTHROMBIN TIME: 10.8 s (ref 9.6–13.1)

## 2014-09-23 NOTE — Progress Notes (Signed)
Loop recorder 

## 2014-09-24 ENCOUNTER — Encounter (HOSPITAL_COMMUNITY)
Admission: RE | Disposition: A | Discharge status: Home / Self Care | Payer: Self-pay | Source: Ambulatory Visit | Attending: Internal Medicine

## 2014-09-24 ENCOUNTER — Encounter (HOSPITAL_COMMUNITY): Payer: Self-pay | Admitting: Anesthesiology

## 2014-09-24 ENCOUNTER — Ambulatory Visit (HOSPITAL_COMMUNITY): Payer: Medicare HMO | Admitting: Anesthesiology

## 2014-09-24 ENCOUNTER — Ambulatory Visit (HOSPITAL_COMMUNITY)
Admission: RE | Admit: 2014-09-24 | Discharge: 2014-09-24 | Disposition: A | Discharge status: Home / Self Care | Payer: Medicare HMO | Source: Ambulatory Visit | Attending: Internal Medicine | Admitting: Internal Medicine

## 2014-09-24 DIAGNOSIS — L409 Psoriasis, unspecified: Secondary | ICD-10-CM | POA: Insufficient documentation

## 2014-09-24 DIAGNOSIS — I472 Ventricular tachycardia: Secondary | ICD-10-CM | POA: Diagnosis not present

## 2014-09-24 DIAGNOSIS — E785 Hyperlipidemia, unspecified: Secondary | ICD-10-CM | POA: Diagnosis not present

## 2014-09-24 DIAGNOSIS — I471 Supraventricular tachycardia: Secondary | ICD-10-CM | POA: Diagnosis present

## 2014-09-24 DIAGNOSIS — Z87891 Personal history of nicotine dependence: Secondary | ICD-10-CM | POA: Insufficient documentation

## 2014-09-24 DIAGNOSIS — Z8673 Personal history of transient ischemic attack (TIA), and cerebral infarction without residual deficits: Secondary | ICD-10-CM | POA: Diagnosis not present

## 2014-09-24 DIAGNOSIS — E119 Type 2 diabetes mellitus without complications: Secondary | ICD-10-CM | POA: Insufficient documentation

## 2014-09-24 DIAGNOSIS — I10 Essential (primary) hypertension: Secondary | ICD-10-CM | POA: Insufficient documentation

## 2014-09-24 DIAGNOSIS — I4891 Unspecified atrial fibrillation: Secondary | ICD-10-CM | POA: Diagnosis not present

## 2014-09-24 DIAGNOSIS — Z9119 Patient's noncompliance with other medical treatment and regimen: Secondary | ICD-10-CM | POA: Insufficient documentation

## 2014-09-24 DIAGNOSIS — Z79899 Other long term (current) drug therapy: Secondary | ICD-10-CM | POA: Insufficient documentation

## 2014-09-24 DIAGNOSIS — Z7982 Long term (current) use of aspirin: Secondary | ICD-10-CM | POA: Diagnosis not present

## 2014-09-24 HISTORY — PX: ELECTROPHYSIOLOGIC STUDY: SHX172A

## 2014-09-24 LAB — GLUCOSE, CAPILLARY
GLUCOSE-CAPILLARY: 331 mg/dL — AB (ref 65–99)
GLUCOSE-CAPILLARY: 208 mg/dL — AB (ref 65–99)
Glucose-Capillary: 284 mg/dL — ABNORMAL HIGH (ref 65–99)
Glucose-Capillary: 246 mg/dL — ABNORMAL HIGH (ref 65–99)

## 2014-09-24 SURGERY — A-FLUTTER/A-TACH/SVT ABLATION
Anesthesia: Monitor Anesthesia Care

## 2014-09-24 MED ORDER — INSULIN ASPART 100 UNIT/ML ~~LOC~~ SOLN
SUBCUTANEOUS | Status: DC | PRN
  Administered 2014-09-24: 8 [IU] via SUBCUTANEOUS

## 2014-09-24 MED ORDER — HYDROCODONE-ACETAMINOPHEN 5-325 MG PO TABS: 1.0000 | ORAL_TABLET | ORAL | Status: DC | PRN

## 2014-09-24 MED ORDER — ASPIRIN EC 325 MG PO TBEC: 325.0000 mg | DELAYED_RELEASE_TABLET | Freq: Every day | ORAL | Status: DC

## 2014-09-24 MED ORDER — SODIUM CHLORIDE 0.9 % IV SOLN
INTRAVENOUS | Status: DC
  Administered 2014-09-24: 07:00:00 via INTRAVENOUS

## 2014-09-24 MED ORDER — ONDANSETRON HCL 4 MG/2ML IJ SOLN
INTRAMUSCULAR | Status: DC | PRN
  Administered 2014-09-24: 4 mg via INTRAVENOUS

## 2014-09-24 MED ORDER — MIDAZOLAM HCL 5 MG/5ML IJ SOLN
INTRAMUSCULAR | Status: DC | PRN
  Administered 2014-09-24 (×2): 1 mg via INTRAVENOUS

## 2014-09-24 MED ORDER — FENTANYL CITRATE (PF) 100 MCG/2ML IJ SOLN
INTRAMUSCULAR | Status: DC | PRN
  Administered 2014-09-24: 50 ug via INTRAVENOUS

## 2014-09-24 MED ORDER — PROPOFOL INFUSION 10 MG/ML OPTIME
INTRAVENOUS | Status: DC | PRN
  Administered 2014-09-24: 75 ug/kg/min via INTRAVENOUS

## 2014-09-24 MED ORDER — SODIUM CHLORIDE 0.9 % IJ SOLN: 3.0000 mL | INTRAMUSCULAR | Status: DC | PRN

## 2014-09-24 MED ORDER — METFORMIN HCL 500 MG PO TABS
1000.0000 mg | ORAL_TABLET | Freq: Two times a day (BID) | ORAL | Status: DC
  Filled 2014-09-24 (×2): qty 2

## 2014-09-24 MED ORDER — SODIUM CHLORIDE 0.9 % IV SOLN
2.0000 ug/min | INTRAVENOUS | Status: AC
  Administered 2014-09-24: 2 ug/min via INTRAVENOUS
  Filled 2014-09-24: qty 2

## 2014-09-24 MED ORDER — GLIPIZIDE ER 5 MG PO TB24
5.0000 mg | ORAL_TABLET | Freq: Every day | ORAL | Status: DC
  Filled 2014-09-24: qty 1

## 2014-09-24 MED ORDER — BUPIVACAINE HCL (PF) 0.25 % IJ SOLN
INTRAMUSCULAR | Status: DC | PRN
  Administered 2014-09-24: 25 mL

## 2014-09-24 MED ORDER — BUPIVACAINE HCL (PF) 0.25 % IJ SOLN
INTRAMUSCULAR | Status: AC
  Filled 2014-09-24: qty 30

## 2014-09-24 MED ORDER — ONDANSETRON HCL 4 MG/2ML IJ SOLN: 4.0000 mg | Freq: Four times a day (QID) | INTRAMUSCULAR | Status: DC | PRN

## 2014-09-24 MED ORDER — ACETAMINOPHEN 325 MG PO TABS: 650.0000 mg | ORAL_TABLET | ORAL | Status: DC | PRN

## 2014-09-24 MED ORDER — SODIUM CHLORIDE 0.9 % IJ SOLN: 3.0000 mL | Freq: Two times a day (BID) | INTRAMUSCULAR | Status: DC

## 2014-09-24 MED ORDER — SODIUM CHLORIDE 0.9 % IV SOLN: 250.0000 mL | INTRAVENOUS | Status: DC | PRN

## 2014-09-24 SURGICAL SUPPLY — 26 items
BAG SNAP BAND KOVER 36X36 (MISCELLANEOUS) ×2 IMPLANT
BLANKET WARM UNDERBOD FULL ACC (MISCELLANEOUS) ×1 IMPLANT
CATH CELSIUS THERM D CV 7F (ABLATOR) ×1 IMPLANT
CATH DIAG 6FR PIGTAIL (CATHETERS) ×1 IMPLANT
CATH HEXAPOLAR DAMATO 6F (CATHETERS) ×1 IMPLANT
CATH JOSEPH QUAD ALLRED 6F REP (CATHETERS) ×2 IMPLANT
CATH NAVISTAR SMARTTOUCH DF (ABLATOR) ×1 IMPLANT
CATH SOUNDSTAR 3D IMAGING (CATHETERS) ×1 IMPLANT
CATH WEBSTER BI DIR CS D-F CRV (CATHETERS) ×1 IMPLANT
COVER SWIFTLINK CONNECTOR (BAG) ×1 IMPLANT
NDL TRANSEP BRK 71CM 407200 (NEEDLE) ×1 IMPLANT
NEEDLE TRANSEP BRK 71CM 407200 (NEEDLE) IMPLANT
PACK EP LATEX FREE (CUSTOM PROCEDURE TRAY) ×2
PACK EP LF (CUSTOM PROCEDURE TRAY) ×1 IMPLANT
PAD DEFIB LIFELINK (PAD) ×2 IMPLANT
PATCH CARTO3 (PAD) ×1 IMPLANT
SHEATH AVANTI 11F 11CM (SHEATH) ×1 IMPLANT
SHEATH PINNACLE 6F 10CM (SHEATH) ×4 IMPLANT
SHEATH PINNACLE 7F 10CM (SHEATH) ×1 IMPLANT
SHEATH PINNACLE 8F 10CM (SHEATH) ×2 IMPLANT
SHEATH PINNACLE 9F 10CM (SHEATH) ×1 IMPLANT
SHEATH SWARTZ TS SL2 63CM 8.5F (SHEATH) ×1 IMPLANT
SHIELD RADPAD SCOOP 12X17 (MISCELLANEOUS) ×2 IMPLANT
SYR MEDRAD MARK V 150ML (SYRINGE) ×1 IMPLANT
TUBING CONTRAST HIGH PRESS 48 (TUBING) ×1 IMPLANT
TUBING SMART ABLATE COOLFLOW (TUBING) ×1 IMPLANT

## 2014-09-24 NOTE — Progress Notes (Addendum)
Site area: right groin a 6 french X2 and 8 french venous sheath was removed   Site Prior to Removal:  Level 0  Pressure Applied For 15 MINUTES    Minutes Beginning at 0945a  Manual:   Yes.    Patient Status During Pull:  stable  Post Pull Groin Site:  Level 0  Post Pull Instructions Given:  Yes.    Post Pull Pulses Present:  Yes.    Dressing Applied:  Yes.    Comments:  Pt resting with eyes close and responds to voice.  Pt denies any discomfort at site at this time

## 2014-09-24 NOTE — Anesthesia Procedure Notes (Signed)
Procedure Name: MAC Date/Time: 09/24/2014 7:50 AM Performed by: Jenne Campus Pre-anesthesia Checklist: Timeout performed, Emergency Drugs available, Patient identified, Suction available and Patient being monitored Patient Re-evaluated:Patient Re-evaluated prior to inductionOxygen Delivery Method: Simple face mask

## 2014-09-24 NOTE — Discharge Instructions (Signed)
No driving for 3 days. No lifting over 5 lbs for 1 week. No sexual activity for 1 week. You may return to work in 1 week.  Keep procedure site clean & dry. If you notice increased pain, swelling, bleeding or pus, call/return!  You may shower, but no soaking baths/hot tubs/pools for 1 week.  ° ° °

## 2014-09-24 NOTE — Progress Notes (Addendum)
Site area: right Internal Jugular a 6 french venous sheath was removed  Site Prior to Removal:  Level 0  Pressure Applied For 10 MINUTES    Minutes Beginning at 1005a  Manual:   Yes.    Patient Status During Pull:  stable  Post Pull Groin Site:  Level 0  Post Pull Instructions Given:  Yes.    Post Pull Pulses Present:  Yes.    Dressing Applied:  Yes.    Comments:  VS remain stable during sheath pull.  Pt resting with eyes close and responding to voice.  No complaint of discomfort at this time.Marland Kitchen

## 2014-09-24 NOTE — Transfer of Care (Signed)
Immediate Anesthesia Transfer of Care Note  Patient: Tony Jackson.  Procedure(s) Performed: Procedure(s): SVT Ablation (N/A)  Patient Location: Cath Lab  Anesthesia Type:MAC  Level of Consciousness: awake, oriented and patient cooperative  Airway & Oxygen Therapy: Patient Spontanous Breathing and Patient connected to face mask oxygen  Post-op Assessment: Report given to RN and Post -op Vital signs reviewed and stable  Post vital signs: Reviewed  Last Vitals:  Filed Vitals:   09/24/14 0613  BP: 138/91  Pulse: 70  Temp: 36.8 C  Resp: 18    Complications: No apparent anesthesia complications

## 2014-09-24 NOTE — Progress Notes (Signed)
Doing well s/p ablation No concerns  Exam is benign.  Vitals are stable.  Stop diltiazem  DC to home after bed rest Follow-up with me in 4 weeks.  [name] MD, [facility] 09/24/2014 2:47 PM

## 2014-09-24 NOTE — Anesthesia Postprocedure Evaluation (Signed)
  Anesthesia Post-op Note  Patient: Tony Jackson.  Procedure(s) Performed: Procedure(s): SVT Ablation (N/A)  Patient Location: PACU  Anesthesia Type: MAC  Level of Consciousness: awake and alert   Airway and Oxygen Therapy: Patient Spontanous Breathing  Post-op Pain: Controlled  Post-op Assessment: Post-op Vital signs reviewed, Patient's Cardiovascular Status Stable and Respiratory Function Stable  Post-op Vital Signs: Reviewed  Filed Vitals:   09/24/14 1015  BP: 123/71  Pulse: 75  Temp:   Resp: 17    Complications: No apparent anesthesia complications

## 2014-09-24 NOTE — Progress Notes (Signed)
Inpatient Diabetes Program Recommendations  AACE/ADA: New Consensus Statement on Inpatient Glycemic Control (2013)  Target Ranges:  Prepandial:   less than 140 mg/dL      Peak postprandial:   less than 180 mg/dL (1-2 hours)      Critically ill patients:  140 - 180 mg/dL   Results for Tony Jackson, Tony Jackson (MRN [id number]) as of 09/24/2014 11:13  Ref. Range 09/24/2014 06:40 09/24/2014 08:51 09/24/2014 09:42  Glucose-Capillary Latest Ref Range: 65-99 mg/dL 331 (H) 284 (H) 246 (H)    Diabetes history: DM2 Outpatient Diabetes medications: Glipizide 5 mg QAM Metformin 1000 mg BID Current orders for Inpatient glycemic control:  Glipizide 5 mg QAM Metformin 1000 mg BID  Inpatient Diabetes Program Recommendations Correction (SSI): While inpatient please consider ordering CBGs with Novolog moderate correction scale ACHS. HgbA1C: Please consider ordering an A1C to evaluate glycemic control over the past 2-3 months.  Thanks, Barnie Alderman, RN, MSN, CCRN, CDE Diabetes Coordinator Inpatient Diabetes Program [phone] (Team Pager from [place] to [place]) [phone] (AP office) [phone] [facility] office) [phone] [facility] office)

## 2014-09-24 NOTE — H&P (Signed)
Tony Jackson. is a 74 y.o. Male who presents today for SVt ablation. He was seen in the ER 06/2013 for increasing weakness and pre-syncope and was found to be in SVT that was terminated with adenosine.   He denies chest pain, palpitations, dyspnea, PND, orthopnea, nausea, vomiting, dizziness, syncope.  Past Medical History  Diagnosis Date  . Diabetes mellitus   . Hyperlipidemia   . Psoriasis   . Hypertension   . ED (erectile dysfunction)   . History of nephrolithiasis   . Elevated PSA   . Frequency of urination   . Lung nodule   . CVA (cerebral infarction)   . PSVT (paroxysmal supraventricular tachycardia)    Past Surgical History  Procedure Laterality Date  . Tonsillectomy  1960  . Nm myoview ltd      normal EF 56% 03/08  . Prostate biopsy  01/05/2012    Procedure: BIOPSY TRANSRECTAL ULTRASONIC PROSTATE (TUBP); Surgeon: [name], MD; Location: [facility]; Service: Urology; Laterality: N/A;  . Cataract extraction w/ intraocular lens implant Right 03/30/12    Dr Kathrin Penner  . Tee without cardioversion N/A 05/21/2014    Procedure: TRANSESOPHAGEAL ECHOCARDIOGRAM (TEE); Surgeon: Jerline Pain, MD; Location: [facility] ENDOSCOPY; Service: Cardiovascular; Laterality: N/A;    Current Outpatient Prescriptions  Medication Sig Dispense Refill  . aspirin EC 325 MG tablet Take 1 tablet (325 mg total) by mouth daily. 30 tablet 0  . diltiazem (DILACOR XR) 180 MG 24 hr capsule Take 1 capsule (180 mg total) by mouth daily. 30 capsule 11  . glipiZIDE (GLUCOTROL XL) 5 MG 24 hr tablet Take 5 mg by mouth daily with breakfast.    . ketoconazole (NIZORAL) 2 % shampoo Apply 1 application topically 2 (two) times a week. 120 mL 5  . lisinopril (PRINIVIL,ZESTRIL) 10 MG tablet Take 1 tablet (10 mg total) by mouth daily. 90 tablet 1  . metFORMIN (GLUCOPHAGE) 1000 MG tablet TAKE  1 TABLET (1,000 MG TOTAL) BY MOUTH 2 (TWO) TIMES DAILY WITH A MEAL. 60 tablet 8  . mometasone (ELOCON) 0.1 % cream Apply 1 application topically 2 (two) times daily as needed (skin).     . sildenafil (VIAGRA) 100 MG tablet Take 50-100 mg by mouth daily as needed for erectile dysfunction.     . triamcinolone cream (KENALOG) 0.1 % Apply 1 application topically 2 (two) times daily.     No current facility-administered medications for this visit.    Allergies: Review of patient's allergies indicates no known allergies.   Social History: History   Social History  . Marital Status: Married    Spouse Name: N/A  . Number of Children: 2  . Years of Education: N/A   Occupational History  . Architect- paving    Social History Main Topics  . Smoking status: Former Smoker -- 1.00 packs/day for 5 years    Types: Cigarettes  . Smokeless tobacco: Current User    Types: Chew     Comment: QUIT SMOKING CIGARETTES 50 YRS AGO-- CHEWED TOBACCO FOR 40 YRS  . Alcohol Use: No  . Drug Use: No  . Sexual Activity: Not Currently   Other Topics Concern  . Not on file   Social History Narrative   No living will   Requests wife as health care POA.   Would accept resuscitation attempts   Not sure about tube feeds    Family History: Family History  Problem Relation Age of Onset  . Cancer Mother  breast cancer  . Diabetes Mother   . Heart disease Neg Hx                Filed Vitals:   09/24/14 0613  BP: 138/91  Pulse: 70  Temp: 98.3 F (36.8 C)  Resp: 18  GEN- The patient is well appearing, alert and oriented x 3 today.  HEENT: normocephalic, atraumatic; sclera clear, conjunctiva pink; hearing intact; oropharynx clear; neck supple  Lungs- Clear to ausculation bilaterally, normal work of breathing. No wheezes, rales, rhonchi Heart- Regular rate and rhythm, no murmurs,  rubs or gallops  GI- soft, non-tender, non-distended, bowel sounds present  Extremities- no clubbing, cyanosis, or edema; DP/PT/radial pulses 2+ bilaterally MS- no significant deformity or atrophy Skin- warm and dry, no rash or lesion  Psych- euthymic mood, full affect Neuro- strength and sensation are intact  Recent Labs: 05/20/2014: ALT 29; TSH 2.955 07/17/2014: Hemoglobin 15.8; Platelets 221 07/31/2014: BUN 31*; Creat 1.25; Potassium 4.5; Sodium 145   Other studies Reviewed: Additional studies/ records that were reviewed today include: ER records  Assessment and Plan: 1. SVT The patient has adenosine sensitive short RP tachycardia at 160bpm most consistent with AVNRT. He has had 2 evaluations in the hospital for weakness and pre-syncope which correspond with his SVT for which he is otherwise asymptomatic. Therapeutic strategies for supraventricular tachycardia including medicine and ablation were discussed in detail with the patient today. Risk, benefits, and alternatives to EP study and radiofrequency ablation were also discussed in detail today. These risks include but are not limited to stroke, bleeding, vascular damage, tamponade, perforation, damage to the heart and other structures, AV block requiring pacemaker, worsening renal function, and death. The patient understands these risk and wishes to proceed.    2. Diabetes Noncompliant with therapy BS today is > 300.  I have encouraged him to follow-up with his primary care provider.  [name] MD, [facility] 09/24/2014 7:37 AM

## 2014-09-24 NOTE — Anesthesia Preprocedure Evaluation (Addendum)
Anesthesia Evaluation  Patient identified by MRN, date of birth, ID band Patient awake    Reviewed: Allergy & Precautions, H&P , NPO status , Patient's Chart, lab work & pertinent test results  Airway Mallampati: II  TM Distance: <3 FB Neck ROM: Full    Dental no notable dental hx. (+) Teeth Intact, Dental Advisory Given   Pulmonary former smoker,  breath sounds clear to auscultation  Pulmonary exam normal       Cardiovascular hypertension, Pt. on medications + dysrhythmias Atrial Fibrillation and Supra Ventricular Tachycardia Rhythm:Irregular Rate:Normal     Neuro/Psych TIACVA, No Residual Symptoms negative psych ROS   GI/Hepatic negative GI ROS, Neg liver ROS,   Endo/Other  diabetes, Type 2, Oral Hypoglycemic Agents  Renal/GU negative Renal ROS  negative genitourinary   Musculoskeletal   Abdominal   Peds  Hematology negative hematology ROS (+)   Anesthesia Other Findings   Reproductive/Obstetrics negative OB ROS                          Anesthesia Physical Anesthesia Plan  ASA: III  Anesthesia Plan: MAC   Post-op Pain Management:    Induction: Intravenous  Airway Management Planned: Simple Face Mask  Additional Equipment:   Intra-op Plan:   Post-operative Plan:   Informed Consent: I have reviewed the patients History and Physical, chart, labs and discussed the procedure including the risks, benefits and alternatives for the proposed anesthesia with the patient or authorized representative who has indicated his/her understanding and acceptance.   Dental advisory given  Plan Discussed with: CRNA  Anesthesia Plan Comments:        Anesthesia Quick Evaluation

## 2014-09-25 ENCOUNTER — Telehealth: Payer: Self-pay

## 2014-09-25 ENCOUNTER — Telehealth: Payer: Self-pay | Admitting: *Deleted

## 2014-09-25 NOTE — Telephone Encounter (Signed)
Tony Jackson left [name]; pt had cardiac ablation on 09/24/14; BS on 09/24/14 was 331; Dr Rayann Heman was going to send note to Dr Silvio Pate about pts BS; 09/25/14 BS was 411. [name] request cb ASAP about what to do about possibly increasing Metformin.Please advise.

## 2014-09-25 NOTE — Telephone Encounter (Signed)
Left detailed message on VM with results, advised wife or pt to return my call that they received my message.

## 2014-09-25 NOTE — Telephone Encounter (Signed)
Let her know that Dr Rayann Heman had let me know but I thought it might just be up temporarily from the stress of the procedure. He is on the most metformin I recommend but he can increase the glipizide to 5mg  twice a day--with breakfast and dinner If the sugars are still up over 200 in a week, I will need to see him

## 2014-09-25 NOTE — Telephone Encounter (Signed)
.  left message to have patient's wife return my call.  

## 2014-09-25 NOTE — Telephone Encounter (Signed)
Spoke to patient about ILR remote from today (8-5). I explained to him that the counters state that there are (2) tachy episodes, but there are no ECGs. I asked patient to send a manual transmission. Patient voiced understanding and plans to send this weekend.

## 2014-10-01 ENCOUNTER — Encounter: Payer: Self-pay | Admitting: Internal Medicine

## 2014-10-13 ENCOUNTER — Encounter: Payer: Self-pay | Admitting: Internal Medicine

## 2014-10-19 ENCOUNTER — Ambulatory Visit (INDEPENDENT_AMBULATORY_CARE_PROVIDER_SITE_OTHER): Payer: Medicare HMO | Admitting: *Deleted

## 2014-10-19 DIAGNOSIS — I639 Cerebral infarction, unspecified: Secondary | ICD-10-CM | POA: Diagnosis not present

## 2014-10-21 NOTE — Progress Notes (Signed)
Loop recorder 

## 2014-10-22 ENCOUNTER — Encounter: Payer: Self-pay | Admitting: Cardiology

## 2014-10-30 ENCOUNTER — Encounter: Payer: Self-pay | Admitting: Cardiology

## 2014-11-05 ENCOUNTER — Encounter: Payer: Self-pay | Admitting: Cardiology

## 2014-11-09 ENCOUNTER — Ambulatory Visit (INDEPENDENT_AMBULATORY_CARE_PROVIDER_SITE_OTHER): Payer: Medicare HMO | Admitting: Internal Medicine

## 2014-11-09 ENCOUNTER — Encounter: Payer: Self-pay | Admitting: Internal Medicine

## 2014-11-09 VITALS — BP 152/90 | HR 72 | Ht 70.0 in | Wt 176.4 lb

## 2014-11-09 DIAGNOSIS — I471 Supraventricular tachycardia: Secondary | ICD-10-CM

## 2014-11-09 DIAGNOSIS — I10 Essential (primary) hypertension: Secondary | ICD-10-CM | POA: Diagnosis not present

## 2014-11-09 DIAGNOSIS — I63429 Cerebral infarction due to embolism of unspecified anterior cerebral artery: Secondary | ICD-10-CM

## 2014-11-09 LAB — CUP PACEART REMOTE DEVICE CHECK: MDC IDC SESS DTM: 20160919163121

## 2014-11-09 NOTE — Patient Instructions (Addendum)
Medication Instructions:  Your physician recommends that you continue on your current medications as directed. Please refer to the Current Medication list given to you today.   Labwork: None ordered  Testing/Procedures: None ordered  Follow-Up: Your physician recommends that you schedule a follow-up appointment as needed with Dr Allred   Any Other Special Instructions Will Be Listed Below (If Applicable).   

## 2014-11-09 NOTE — Progress Notes (Signed)
Electrophysiology Office Note Date: 11/09/2014  ID:  Tony Canner., DOB Sep 13, 1940, MRN [id number]  PCP: Viviana Simpler, MD Electrophysiologist: Rayann Heman  CC: SVT follow up  Tony Jackson. is a 74 y.o. male seen today for Dr Rayann Heman.  Doing well wthout any further SVT.  Denies procedure related complications post avnrt ablation.  Since discharge, the patient reports doing very well.  He denies chest pain, palpitations, dyspnea, PND, orthopnea, nausea, vomiting, dizziness, syncope.  Past Medical History  Diagnosis Date  . Diabetes mellitus   . Hyperlipidemia   . Psoriasis   . Hypertension   . ED (erectile dysfunction)   . History of nephrolithiasis   . Elevated PSA   . Frequency of urination   . Lung nodule   . CVA (cerebral infarction)   . PSVT (paroxysmal supraventricular tachycardia)    Past Surgical History  Procedure Laterality Date  . Tonsillectomy  1960  . Nm myoview ltd      normal EF 56% 03/08  . Prostate biopsy  01/05/2012    Procedure: BIOPSY TRANSRECTAL ULTRASONIC PROSTATE (TUBP);  Surgeon: [name], MD;  Location: [facility];  Service: Urology;  Laterality: N/A;  . Cataract extraction w/ intraocular lens implant Right 03/30/12    Dr Kathrin Penner  . Tee without cardioversion N/A 05/21/2014    Procedure: TRANSESOPHAGEAL ECHOCARDIOGRAM (TEE);  Surgeon: Jerline Pain, MD;  Location: [facility] ENDOSCOPY;  Service: Cardiovascular;  Laterality: N/A;  . Electrophysiologic study N/A 09/24/2014    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    Current Outpatient Prescriptions  Medication Sig Dispense Refill  . aspirin EC 325 MG tablet Take 1 tablet (325 mg total) by mouth daily. 30 tablet 0  . glipiZIDE (GLUCOTROL XL) 5 MG 24 hr tablet Take 1 tablet (5 mg total) by mouth daily with breakfast. 90 tablet 3  . ketoconazole (NIZORAL) 2 % shampoo Apply 1 application topically 2 (two) times a week. 120 mL 5  . lisinopril (PRINIVIL,ZESTRIL) 20 MG tablet Take 1 tablet (20 mg  total) by mouth daily. 90 tablet 3  . metFORMIN (GLUCOPHAGE) 1000 MG tablet TAKE 1 TABLET (1,000 MG TOTAL) BY MOUTH 2 (TWO) TIMES DAILY WITH A MEAL. 60 tablet 8  . mometasone (ELOCON) 0.1 % cream Apply 1 application topically 2 (two) times daily as needed (skin).     . sildenafil (VIAGRA) 100 MG tablet Take 50-100 mg by mouth daily as needed for erectile dysfunction.     . simvastatin (ZOCOR) 40 MG tablet Take 40 mg by mouth at bedtime.    . triamcinolone cream (KENALOG) 0.1 % Apply 1 application topically 2 (two) times daily as needed (itching).     No current facility-administered medications for this visit.    Allergies:   Review of patient's allergies indicates no known allergies.   Social History: Social History   Social History  . Marital Status: Married    Spouse Name: Tony Jackson  . Number of Children: 2  . Years of Education: HS   Occupational History  . Architect- paving    Social History Main Topics  . Smoking status: Former Smoker -- 1.00 packs/day for 5 years    Types: Cigarettes  . Smokeless tobacco: Current User    Types: Chew     Comment: QUIT SMOKING CIGARETTES 50 YRS AGO--  CHEWED TOBACCO FOR 40 YRS  . Alcohol Use: No  . Drug Use: No  . Sexual Activity: Not Currently   Other Topics Concern  .  Not on file   Social History Narrative   No living will   Requests wife as health care POA.   Would accept resuscitation attempts   Not sure about tube feeds   Caffeine Use    Family History: Family History  Problem Relation Age of Onset  . Cancer Mother     breast cancer  . Diabetes Mother   . Heart disease Neg Hx     Review of Systems: All other systems reviewed and are otherwise negative except as noted above.   Physical Exam: VS:  There were no vitals taken for this visit. , BMI There is no weight on file to calculate BMI. Wt Readings from Last 3 Encounters:  11/09/14 176 lb 6.4 oz (80.015 kg)  09/24/14 173 lb (78.472 kg)  09/03/14 172 lb 12 oz  (78.359 kg)    GEN- The patient is well appearing, alert and oriented x 3 today.   HEENT: normocephalic, atraumatic; sclera clear, conjunctiva pink; hearing intact; oropharynx clear; neck supple  Lungs- Clear to ausculation bilaterally, normal work of breathing.  No wheezes, rales, rhonchi Heart- Regular rate and rhythm, no murmurs, rubs or gallops  GI- soft, non-tender, non-distended, bowel sounds present  Extremities- no clubbing, cyanosis, or edema; DP/PT/radial pulses 2+ bilaterally MS- no significant deformity or atrophy Skin- warm and dry, no rash or lesion  Psych- euthymic mood, full affect Neuro- strength and sensation are intact   EKG:  EKG is ordered today. The ekg ordered today shows sinus rhyth, PR 214, otherwise normal ekg   Recent Labs: 05/20/2014: ALT 29; TSH 2.955 09/21/2014: BUN 25*; Creatinine, Ser 1.30; Hemoglobin 13.3; Platelets 184.0; Potassium 4.5; Sodium 139   Assessment and Plan: 1.  SVT Resolved post ablation  2.  Cryptogenic stroke No AF detected on LINQ Continue long term remote monitoring - reinforced need to keep monitor plugged in and transmitting today  Continue ASA 325mg  daily, statin  3.  HTN Stable No change required today   Current medicines are reviewed at length with the patient today.   The patient does not have concerns regarding his medicines.  The following changes were made today:  Increase Diltiazem to 180mg  daily today  Labs/ tests ordered today include: none  Will follow remotely and see in the office prn  Signed, [name] MD  11/09/2014 3:52 PM   [address] [place] [address] [phone] (office) [phone] (fax)

## 2014-11-10 NOTE — Progress Notes (Signed)
Entered in error See appropriate note

## 2014-11-13 NOTE — Addendum Note (Signed)
Addended by: Freada Bergeron on: 11/13/2014 05:53 PM   Modules accepted: Orders

## 2014-11-17 ENCOUNTER — Ambulatory Visit (INDEPENDENT_AMBULATORY_CARE_PROVIDER_SITE_OTHER): Payer: Medicare HMO | Admitting: *Deleted

## 2014-11-17 DIAGNOSIS — I63429 Cerebral infarction due to embolism of unspecified anterior cerebral artery: Secondary | ICD-10-CM

## 2014-11-18 LAB — CUP PACEART REMOTE DEVICE CHECK: MDC IDC SESS DTM: 20160828190615

## 2014-11-18 NOTE — Progress Notes (Signed)
Carelink summary report received. Battery status OK. Normal device function. No new symptom, brady, pause, or AF episodes, +ASA 325mg . 2 tachy episodes, no EGMs available, SVT ablation on 09/24/14. Monthly summary reports and ROV with JA on 11/09/14 at 3:00pm.

## 2014-11-19 NOTE — Progress Notes (Signed)
Loop recorder 

## 2014-11-26 ENCOUNTER — Encounter: Payer: Self-pay | Admitting: Internal Medicine

## 2014-12-02 ENCOUNTER — Other Ambulatory Visit: Payer: Self-pay | Admitting: *Deleted

## 2014-12-02 MED ORDER — SIMVASTATIN 40 MG PO TABS: 40.0000 mg | ORAL_TABLET | Freq: Every day | ORAL | Status: DC

## 2014-12-02 MED ORDER — METFORMIN HCL 1000 MG PO TABS: 1000.0000 mg | ORAL_TABLET | Freq: Two times a day (BID) | ORAL | Status: DC

## 2014-12-02 NOTE — Telephone Encounter (Signed)
Ok to fill simvastatin?

## 2014-12-02 NOTE — Telephone Encounter (Signed)
Approved: okay a year for both

## 2014-12-10 ENCOUNTER — Encounter: Payer: Self-pay | Admitting: Internal Medicine

## 2014-12-14 LAB — CUP PACEART REMOTE DEVICE CHECK: MDC IDC SESS DTM: 20160927190711

## 2014-12-14 NOTE — Progress Notes (Signed)
Carelink summary report received. Battery status OK. Normal device function. No new symptom episodes, tachy episodes, brady, or pause episodes. 3 AF episodes, inappropriate, ECGs suggest ?ST with occasional undersensing, +ASA 325mg . 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 Monthly summary reports and ROV with JA PRN.

## 2014-12-17 ENCOUNTER — Ambulatory Visit (INDEPENDENT_AMBULATORY_CARE_PROVIDER_SITE_OTHER): Payer: Medicare HMO | Admitting: *Deleted

## 2014-12-17 DIAGNOSIS — I639 Cerebral infarction, unspecified: Secondary | ICD-10-CM | POA: Diagnosis not present

## 2014-12-18 NOTE — Progress Notes (Signed)
Loop recorder 

## 2014-12-31 ENCOUNTER — Encounter: Payer: Self-pay | Admitting: Internal Medicine

## 2015-01-01 ENCOUNTER — Encounter: Payer: Self-pay | Admitting: Internal Medicine

## 2015-01-13 ENCOUNTER — Encounter: Payer: Self-pay | Admitting: Internal Medicine

## 2015-01-15 LAB — CUP PACEART REMOTE DEVICE CHECK: MDC IDC SESS DTM: 20161027193633

## 2015-01-15 NOTE — Progress Notes (Signed)
Carelink summary report received. Battery status OK. Normal device function. No new symptom episodes, tachy episodes, brady, or pause episodes. 1 AF episode, inappropriate, SR w/occasional undersensing due to variable R-wave amplitude, +ASA 325mg . Monthly summary reports and ROV with JA PRN.

## 2015-01-18 ENCOUNTER — Ambulatory Visit (INDEPENDENT_AMBULATORY_CARE_PROVIDER_SITE_OTHER): Payer: Medicare HMO | Admitting: *Deleted

## 2015-01-18 DIAGNOSIS — I639 Cerebral infarction, unspecified: Secondary | ICD-10-CM | POA: Diagnosis not present

## 2015-01-21 NOTE — Progress Notes (Signed)
LOOP RECORDER  

## 2015-01-27 ENCOUNTER — Ambulatory Visit (INDEPENDENT_AMBULATORY_CARE_PROVIDER_SITE_OTHER): Payer: Medicare HMO | Admitting: Internal Medicine

## 2015-01-27 ENCOUNTER — Encounter: Payer: Self-pay | Admitting: Internal Medicine

## 2015-01-27 VITALS — BP 140/80 | HR 74 | Temp 98.4°F | Ht 70.0 in | Wt 175.0 lb

## 2015-01-27 DIAGNOSIS — E1121 Type 2 diabetes mellitus with diabetic nephropathy: Secondary | ICD-10-CM

## 2015-01-27 DIAGNOSIS — E1122 Type 2 diabetes mellitus with diabetic chronic kidney disease: Secondary | ICD-10-CM

## 2015-01-27 DIAGNOSIS — I471 Supraventricular tachycardia: Secondary | ICD-10-CM

## 2015-01-27 DIAGNOSIS — N183 Chronic kidney disease, stage 3 unspecified: Secondary | ICD-10-CM

## 2015-01-27 DIAGNOSIS — Z23 Encounter for immunization: Secondary | ICD-10-CM | POA: Diagnosis not present

## 2015-01-27 DIAGNOSIS — N1832 Chronic kidney disease, stage 3b: Secondary | ICD-10-CM | POA: Insufficient documentation

## 2015-01-27 DIAGNOSIS — N1831 Chronic kidney disease, stage 3a: Secondary | ICD-10-CM | POA: Insufficient documentation

## 2015-01-27 DIAGNOSIS — E785 Hyperlipidemia, unspecified: Secondary | ICD-10-CM | POA: Diagnosis not present

## 2015-01-27 DIAGNOSIS — Z0000 Encounter for general adult medical examination without abnormal findings: Secondary | ICD-10-CM | POA: Diagnosis not present

## 2015-01-27 DIAGNOSIS — I63429 Cerebral infarction due to embolism of unspecified anterior cerebral artery: Secondary | ICD-10-CM

## 2015-01-27 DIAGNOSIS — Z7189 Other specified counseling: Secondary | ICD-10-CM | POA: Insufficient documentation

## 2015-01-27 LAB — CBC WITH DIFFERENTIAL/PLATELET
BASOS ABS: 0.1 10*3/uL (ref 0.0–0.1)
Basophils Relative: 0.7 % (ref 0.0–3.0)
EOS ABS: 0.2 10*3/uL (ref 0.0–0.7)
Eosinophils Relative: 3.1 % (ref 0.0–5.0)
HEMATOCRIT: 47.1 % (ref 39.0–52.0)
Hemoglobin: 15.5 g/dL (ref 13.0–17.0)
LYMPHS PCT: 14.7 % (ref 12.0–46.0)
Lymphs Abs: 1.1 10*3/uL (ref 0.7–4.0)
MCHC: 33 g/dL (ref 30.0–36.0)
MCV: 90.7 fl (ref 78.0–100.0)
Monocytes Absolute: 0.6 10*3/uL (ref 0.1–1.0)
Monocytes Relative: 7.6 % (ref 3.0–12.0)
NEUTROS ABS: 5.6 10*3/uL (ref 1.4–7.7)
NEUTROS PCT: 73.9 % (ref 43.0–77.0)
PLATELETS: 196 10*3/uL (ref 150.0–400.0)
RBC: 5.19 Mil/uL (ref 4.22–5.81)
RDW: 13.4 % (ref 11.5–15.5)
WBC: 7.6 10*3/uL (ref 4.0–10.5)

## 2015-01-27 LAB — LIPID PANEL
Cholesterol: 204 mg/dL — ABNORMAL HIGH (ref 0–200)
HDL: 45.3 mg/dL (ref >39.00)
NonHDL: 158.88
Total CHOL/HDL Ratio: 5
Triglycerides: 228 mg/dL — ABNORMAL HIGH (ref 0.0–149.0)
VLDL: 45.6 mg/dL — ABNORMAL HIGH (ref 0.0–40.0)

## 2015-01-27 LAB — COMPREHENSIVE METABOLIC PANEL
ALBUMIN: 4.3 g/dL (ref 3.5–5.2)
ALK PHOS: 47 U/L (ref 39–117)
ALT: 26 U/L (ref 0–53)
AST: 17 U/L (ref 0–37)
BILIRUBIN TOTAL: 0.7 mg/dL (ref 0.2–1.2)
BUN: 26 mg/dL — ABNORMAL HIGH (ref 6–23)
CALCIUM: 10.1 mg/dL (ref 8.4–10.5)
CO2: 28 meq/L (ref 19–32)
CREATININE: 1.25 mg/dL (ref 0.40–1.50)
Chloride: 106 mEq/L (ref 96–112)
GFR: 59.99 mL/min — ABNORMAL LOW (ref >60.00)
Glucose, Bld: 158 mg/dL — ABNORMAL HIGH (ref 70–99)
Potassium: 4.8 mEq/L (ref 3.5–5.1)
Sodium: 142 mEq/L (ref 135–145)
TOTAL PROTEIN: 6.9 g/dL (ref 6.0–8.3)

## 2015-01-27 LAB — HM DIABETES FOOT EXAM

## 2015-01-27 LAB — HEMOGLOBIN A1C: Hgb A1c MFr Bld: 7.1 % — ABNORMAL HIGH (ref 4.6–6.5)

## 2015-01-27 LAB — T4, FREE: Free T4: 0.76 ng/dL (ref 0.60–1.60)

## 2015-01-27 LAB — LDL CHOLESTEROL, DIRECT: LDL DIRECT: 117 mg/dL

## 2015-01-27 MED ORDER — LISINOPRIL 40 MG PO TABS: 40.0000 mg | ORAL_TABLET | Freq: Every day | ORAL | Status: DC

## 2015-01-27 NOTE — Assessment & Plan Note (Signed)
Left arm is better Some balance issues On appropriate secondary prevention with ASA, statin

## 2015-01-27 NOTE — Addendum Note (Signed)
Addended by: Despina Hidden on: 01/27/2015 10:13 AM   Modules accepted: Orders, Medications

## 2015-01-27 NOTE — Assessment & Plan Note (Signed)
On lisinopril Will increase dose since his readings are high at home

## 2015-01-27 NOTE — Patient Instructions (Signed)
Please increase the lisinopril to 40mg  daily. You can use 2 of the 20mg  tabs till they run out.  Diabetes and Exercise Exercising regularly is important. It is not just about losing weight. It has many health benefits, such as:  Improving your overall fitness, flexibility, and endurance.  Increasing your bone density.  Helping with weight control.  Decreasing your body fat.  Increasing your muscle strength.  Reducing stress and tension.  Improving your overall health. People with diabetes who exercise gain additional benefits because exercise:  Reduces appetite.  Improves the body's use of blood sugar (glucose).  Helps lower or control blood glucose.  Decreases blood pressure.  Helps control blood lipids (such as cholesterol and triglycerides).  Improves the body's use of the hormone insulin by:  Increasing the body's insulin sensitivity.  Reducing the body's insulin needs.  Decreases the risk for heart disease because exercising:  Lowers cholesterol and triglycerides levels.  Increases the levels of good cholesterol (such as high-density lipoproteins [HDL]) in the body.  Lowers blood glucose levels. YOUR ACTIVITY PLAN  Choose an activity that you enjoy, and set realistic goals. To exercise safely, you should begin practicing any new physical activity slowly, and gradually increase the intensity of the exercise over time. Your health care provider or diabetes educator can help create an activity plan that works for you. General recommendations include:  Encouraging children to engage in at least 60 minutes of physical activity each day.  Stretching and performing strength training exercises, such as yoga or weight lifting, at least 2 times per week.  Performing a total of at least 150 minutes of moderate-intensity exercise each week, such as brisk walking or water aerobics.  Exercising at least 3 days per week, making sure you allow no more than 2 consecutive days  to pass without exercising.  Avoiding long periods of inactivity (90 minutes or more). When you have to spend an extended period of time sitting down, take frequent breaks to walk or stretch. RECOMMENDATIONS FOR EXERCISING WITH TYPE 1 OR TYPE 2 DIABETES   Check your blood glucose before exercising. If blood glucose levels are greater than 240 mg/dL, check for urine ketones. Do not exercise if ketones are present.  Avoid injecting insulin into areas of the body that are going to be exercised. For example, avoid injecting insulin into:  The arms when playing tennis.  The legs when jogging.  Keep a record of:  Food intake before and after you exercise.  Expected peak times of insulin action.  Blood glucose levels before and after you exercise.  The type and amount of exercise you have done.  Review your records with your health care provider. Your health care provider will help you to develop guidelines for adjusting food intake and insulin amounts before and after exercising.  If you take insulin or oral hypoglycemic agents, watch for signs and symptoms of hypoglycemia. They include:  Dizziness.  Shaking.  Sweating.  Chills.  Confusion.  Drink plenty of water while you exercise to prevent dehydration or heat stroke. Body water is lost during exercise and must be replaced.  Talk to your health care provider before starting an exercise program to make sure it is safe for you. Remember, almost any type of activity is better than none.   This information is not intended to replace advice given to you by your health care provider. Make sure you discuss any questions you have with your health care provider.   Document Released: 04/29/2003 Document  Revised: 06/23/2014 Document Reviewed: 07/16/2012 Elsevier Interactive Patient Education [employer].

## 2015-01-27 NOTE — Assessment & Plan Note (Signed)
See social history 

## 2015-01-27 NOTE — Assessment & Plan Note (Signed)
Had ablation No paroxysms that he is aware of

## 2015-01-27 NOTE — Progress Notes (Signed)
Pre visit review using our clinic review tool, if applicable. No additional management support is needed unless otherwise documented below in the visit note. 

## 2015-01-27 NOTE — Progress Notes (Signed)
   Subjective:    Patient ID: Tony Jackson., male    DOB: 02/11/1941, 74 y.o.   MRN: [id number]  HPI Here for Medicare wellness and follow up of chronic medical conditions Reviewed form and advanced directives Other doctors-- Dr Rayann Heman for cardiology, Dr Kennyth Lose, Dr Adrian Prows No alcohol Still chews tobacco-- did quit in his 62's but not clear he is ready. Counseled No exercise--discussed Vision and hearing are okay Did have the ablation in the past year Mild memory problems Independent with instrumental ADLs No sig depression or anhedonia  Feels blah No energy since March stroke Left arm is back to normal  Does have some balance issues since the stroke. Had fall trying to avoid fresh concrete. Minor injuries  Checks BP occasionally Still high at home at times 170/100 --but not taking it at rest No chest pain No palpitations No SOB No edema  Checks sugars sporadically 89/123/180 No hypoglycemic reactions No numbness, pain or sores in feet UTD with eye doctor Review of Systems Appetite is okay Weight is stable Sleeps well Wears seat belt Teeth are okay--regular with dentist Psoriasis still--uses creams. No suspicious lesions Bowels are okay Occasional headaches    Objective:   Physical Exam  Constitutional: He is oriented to person, place, and time. He appears well-developed and well-nourished. No distress.  HENT:  Mouth/Throat: Oropharynx is clear and moist. No oropharyngeal exudate.  Neck: Normal range of motion. Neck supple. No thyromegaly present.  Cardiovascular: Normal rate, regular rhythm, normal heart sounds and intact distal pulses.  Exam reveals no gallop.   No murmur heard. Pulmonary/Chest: Effort normal and breath sounds normal. No respiratory distress. He has no wheezes. He has no rales.  Abdominal: Soft. There is no tenderness.  Musculoskeletal: He exhibits no edema or tenderness.  Lymphadenopathy:    He has no cervical  adenopathy.  Neurological: He is alert and oriented to person, place, and time.  President-- "Obama, Bush, CLinton" [phone] [id number] Recall 2/3  Normal sensation in feet  Skin: No rash noted. No erythema.  Psychiatric: He has a normal mood and affect. His behavior is normal.          Assessment & Plan:

## 2015-01-27 NOTE — Addendum Note (Signed)
Addended by: Marchia Bond on: 01/27/2015 02:14 PM   Modules accepted: Miquel Dunn

## 2015-01-27 NOTE — Assessment & Plan Note (Signed)
I have personally reviewed the Medicare Annual Wellness questionnaire and have noted 1. The patient's medical and social history 2. Their use of alcohol, tobacco or illicit drugs 3. Their current medications and supplements 4. The patient's functional ability including ADL's, fall risks, home safety risks and hearing or visual             impairment. 5. Diet and physical activities 6. Evidence for depression or mood disorders  The patients weight, height, BMI and visual acuity have been recorded in the chart I have made referrals, counseling and provided education to the patient based review of the above and I have provided the pt with a written personalized care plan for preventive services.  I have provided you with a copy of your personalized plan for preventive services. Please take the time to review along with your updated medication list.  Needs flu and prevnar today Sees Dr Alinda Money for PSAs due to minor biopsy abnormality. Should probably stop after 75 Probably no more colons-- last 2011 Needs to exercise

## 2015-01-27 NOTE — Assessment & Plan Note (Signed)
Hopefully still good control On ACEI

## 2015-02-11 ENCOUNTER — Encounter: Payer: Self-pay | Admitting: Internal Medicine

## 2015-02-16 ENCOUNTER — Ambulatory Visit (INDEPENDENT_AMBULATORY_CARE_PROVIDER_SITE_OTHER): Payer: Medicare HMO | Admitting: *Deleted

## 2015-02-16 DIAGNOSIS — I639 Cerebral infarction, unspecified: Secondary | ICD-10-CM

## 2015-02-16 NOTE — Progress Notes (Signed)
Carelink Summary Report / Loop Recorder 

## 2015-02-28 LAB — CUP PACEART REMOTE DEVICE CHECK: Date Time Interrogation Session: 20161125050500

## 2015-03-05 ENCOUNTER — Encounter: Payer: Self-pay | Admitting: Internal Medicine

## 2015-03-08 ENCOUNTER — Encounter: Payer: Self-pay | Admitting: Nurse Practitioner

## 2015-03-08 ENCOUNTER — Ambulatory Visit (INDEPENDENT_AMBULATORY_CARE_PROVIDER_SITE_OTHER): Payer: Medicare HMO | Admitting: Nurse Practitioner

## 2015-03-08 VITALS — BP 167/92 | HR 77 | Ht 70.0 in | Wt 172.8 lb

## 2015-03-08 DIAGNOSIS — E7800 Pure hypercholesterolemia, unspecified: Secondary | ICD-10-CM

## 2015-03-08 DIAGNOSIS — I2510 Atherosclerotic heart disease of native coronary artery without angina pectoris: Secondary | ICD-10-CM | POA: Diagnosis not present

## 2015-03-08 DIAGNOSIS — I10 Essential (primary) hypertension: Secondary | ICD-10-CM

## 2015-03-08 DIAGNOSIS — I63429 Cerebral infarction due to embolism of unspecified anterior cerebral artery: Secondary | ICD-10-CM | POA: Diagnosis not present

## 2015-03-08 NOTE — Patient Instructions (Addendum)
Continue aspirin 325 mg daily for secondary stroke prevention Monitor blood pressure ideal reading 130/90 today's reading167/92 LDL cholesterol less than 70 Diabetes in good control with hemoglobin A1c less than 6.5 Exercise walking daily Healthy diet low-fat stay well-hydrated Follow-up in 6 months

## 2015-03-08 NOTE — Progress Notes (Addendum)
[facility]  PATIENT: Tony Jackson. DOB: [birth year]   REASON FOR VISIT: Follow-up for cryptogenic stroke HISTORY FROM: Patient    HISTORY OF PRESENT ILLNESS: Tony Jackson 75 year old male returns for follow-up. He was last seen in the office by Dr. Leonie Man 08/25/2014. He has a history of cryptogenic stroke admitted to the hospital on 05/19/2014. He had sudden onset of inability to move the left side and heaviness while pushing a wheelbarrow. He has risk factors of diabetes, hypertension, hyperlipidemia. He is currently on aspirin 325 daily without further stroke or TIA symptoms. Blood pressure is elevated today at 167/92. Patient does check his blood pressure at home and he claims he had changes to his lisinopril a couple weeks ago to 40 mg daily. He exercises by walking, a mile several times a week. He says he is going to rejoin the gym. He's had a cardiac ablation several months ago. He returns for reevaluation HISTORY PS [birth year]73 year old male seen today for the first office follow-up visit for hospital admission for stroke on 05/19/14. 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 He stumbled and fell a couple of times. He presented beyond time window for intervention. MRI scan of the brain showed a small acute right frontal MCA branch infarct. MRA of the brain showed focal mid to high-grade stenosis of proximal right M2 branch but without large vessel occlusion. Echocardiogram showed normal ejection fraction. TEE showed no cardiac source of embolism or PFO. Carotid ultrasound showed no significant extra cranial stenosis. Urine drug screen was negative. Hemoglobin A1c was elevated at 7.3. LDL cholesterol was 71, HDL 41, triglycerides were elevated at 279 and total cholesterol was 168 mg percent. Patient was started on aspirin for stroke prevention and had a loop recorder inserted. He states he is  had significant improvement in his left-sided strength and has only minor diminished fine motor skills in his left hand and does occasionally get tired. He was readmitted a few weeks later with supraventricular tachycardia and has been started on diltiazem. He has not been found to have atrial fibrillation on loop recorder monitor yet .He has an upcoming appointment for electrical physiological testing but has concerns about that. He states his blood pressure has been slightly elevated since his blood pressure medicine was changed to diltiazem. He is tolerating aspirin well without bleeding or bruising. He states his cholesterol has been under good control as have been her sugars.   REVIEW OF SYSTEMS: Full 14 system review of systems performed and notable only for those listed, all others are neg:  Constitutional: neg  Cardiovascular: neg Ear/Nose/Throat: neg  Skin: neg Eyes: neg Respiratory: neg Gastroitestinal: Urinary frequency Hematology/Lymphatic: neg  Endocrine: neg Musculoskeletal:neg Allergy/Immunology: neg Neurological: Memory loss, dizziness Psychiatric: neg Sleep : neg   ALLERGIES: No Known Allergies  HOME MEDICATIONS: Outpatient Prescriptions Prior to Visit  Medication Sig Dispense Refill  . aspirin EC 325 MG tablet Take 1 tablet (325 mg total) by mouth daily. 30 tablet 0  . glipiZIDE (GLUCOTROL XL) 5 MG 24 hr tablet Take 1 tablet (5 mg total) by mouth daily with breakfast. 90 tablet 3  . ketoconazole (NIZORAL) 2 % shampoo Apply 1 application topically 2 (two) times a week. 120 mL 5  . lisinopril (PRINIVIL,ZESTRIL) 40 MG tablet Take 1 tablet (40 mg total) by mouth daily. 90 tablet 3  . metFORMIN (GLUCOPHAGE) 1000 MG tablet Take 1 tablet (1,000 mg  total) by mouth 2 (two) times daily with a meal. 180 tablet 3  . mometasone (ELOCON) 0.1 % cream Apply 1 application topically 2 (two) times daily as needed (skin).     . sildenafil (VIAGRA) 100 MG tablet Take 50-100 mg by mouth  daily as needed for erectile dysfunction.     . simvastatin (ZOCOR) 40 MG tablet Take 1 tablet (40 mg total) by mouth at bedtime. 90 tablet 3  . triamcinolone cream (KENALOG) 0.1 % Apply 1 application topically 2 (two) times daily as needed (itching).     No facility-administered medications prior to visit.    PAST MEDICAL HISTORY: Past Medical History  Diagnosis Date  . Diabetes mellitus   . Hyperlipidemia   . Psoriasis   . Hypertension   . ED (erectile dysfunction)   . History of nephrolithiasis   . Elevated PSA   . Frequency of urination   . Lung nodule   . CVA (cerebral infarction)   . PSVT (paroxysmal supraventricular tachycardia) ([place])     PAST SURGICAL HISTORY: Past Surgical History  Procedure Laterality Date  . Tonsillectomy  1960  . Nm myoview ltd      normal EF 56% 03/08  . Prostate biopsy  01/05/2012    Procedure: BIOPSY TRANSRECTAL ULTRASONIC PROSTATE (TUBP);  Surgeon: [name], MD;  [facility];  Service: Urology;  Laterality: N/A;  . Cataract extraction w/ intraocular lens implant Right 03/30/12    Dr Kathrin Penner  . Tee without cardioversion N/A 05/21/2014    Procedure: TRANSESOPHAGEAL ECHOCARDIOGRAM (TEE);  Surgeon: Jerline Pain, MD;  Location: [facility] ENDOSCOPY;  Service: Cardiovascular;  Laterality: N/A;  . 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 Cancer Mother     breast cancer  . Diabetes Mother   . Heart disease Neg Hx     SOCIAL HISTORY: Social History   Social History  . Marital Status: Married    Spouse Name: Peter Congo  . Number of Children: 2  . Years of Education: HS   Occupational History  . 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 Smoking status: Former Smoker -- 1.00 packs/day for 5 years    Types: Cigarettes  . Smokeless tobacco: Current User    Types: Chew      Comment: QUIT SMOKING CIGARETTES 50 YRS AGO--  CHEWED TOBACCO FOR 40 YRS  . Alcohol Use: No  . Drug Use: No  . Sexual Activity: Not Currently   Other Topics Concern  . Not on file   Social History Narrative   Has living will   Requests wife as health care POA.   Would accept resuscitation attempts   Not sure about tube feeds        PHYSICAL EXAM  Filed Vitals:   03/08/15 0836  BP: 167/92  Pulse: 77  Height: 5\' 10"  (1.778 m)  Weight: 172 lb 12.8 oz (78.382 kg)   Body mass index is 24.79 kg/(m^2). General: well developed, well nourished, seated, in no evident distress Head: head normocephalic and atraumatic.  Neck: supple with no carotid or supraclavicular bruits Cardiovascular: regular rate and rhythm, no murmurs Musculoskeletal: no deformity Skin: no rash/petichiae Vascular: Normal pulses all extremities  Neurologic Exam Mental Status: Awake and fully alert. Oriented to place and time. Recent and remote memory intact. Attention span,  concentration and fund of knowledge appropriate. Mood and affect appropriate. Mini-Mental status exam scored 29/30 with deficits in drawing a figure. Animal fluency test 12. Clock drawing 4/4.  Cranial Nerves: Fundoscopic exam reveals sharp disc margins. Pupils equal, briskly reactive to light. Extraocular movements full without nystagmus. Visual fields full to confrontation. Hearing intact. Facial sensation intact. Face, tongue, palate moves normally and symmetrically.  Motor: Normal bulk and tone. Normal strength in all tested extremity muscles. Diminished fine finger movements on the left. Orbits right over left upper extremity. Sensory.: intact to touch ,pinprick .position and vibratory sensation.  Coordination: Rapid alternating movements normal in all extremities. Finger-to-nose and heel-to-shin performed accurately bilaterally. Gait and Station: Arises from chair without difficulty. Stance is normal. Gait demonstrates normal stride  length and balance . Able to heel, toe and tandem walk without difficulty.  Reflexes: 1+ and symmetric. Toes downgoing.   DIAGNOSTIC DATA (LABS, IMAGING, TESTING) - I reviewed patient records, labs, notes, testing and imaging myself where available.  Lab Results  Component Value Date   WBC 7.6 01/27/2015   HGB 15.5 01/27/2015   HCT 47.1 01/27/2015   MCV 90.7 01/27/2015   PLT 196.0 01/27/2015      Component Value Date/Time   NA 142 01/27/2015 0933   K 4.8 01/27/2015 0933   CL 106 01/27/2015 0933   CO2 28 01/27/2015 0933   GLUCOSE 158* 01/27/2015 0933   BUN 26* 01/27/2015 0933   CREATININE 1.25 01/27/2015 0933   CREATININE 1.25 07/31/2014 1633   CALCIUM 10.1 01/27/2015 0933   PROT 6.9 01/27/2015 0933   ALBUMIN 4.3 01/27/2015 0933   AST 17 01/27/2015 0933   ALT 26 01/27/2015 0933   ALKPHOS 47 01/27/2015 0933   BILITOT 0.7 01/27/2015 0933   GFRNONAA 24* 07/17/2014 0824   GFRAA 28* 07/17/2014 0824   Lab Results  Component Value Date   CHOL 204* 01/27/2015   HDL 45.30 01/27/2015   LDLCALC 71 05/20/2014   LDLDIRECT 117.0 01/27/2015   TRIG 228.0* 01/27/2015   CHOLHDL 5 01/27/2015   Lab Results  Component Value Date   HGBA1C 7.1* 01/27/2015   No results found for: [id number] Lab Results  Component Value Date   TSH 2.955 05/20/2014      ASSESSMENT AND PLAN 4 year Caucasian male with right middle cerebral artery branch embolic infarct likely of cryptogenic etiology in March 2016 was doing quite well. Vascular risk factors of hypertension, hyperlipidemia, diabetes.Recent diagnosis of supraventricular tachycardia but atrial fibrillation has not yet been found. Cardiac ablation last September 2016, SPT resolved post-ablation. No atrial fib detected on LINQ. The patient is a current patient of Dr. Leonie Man  who is out of the office today . This note is sent to the work in doctor.     Continue aspirin 325 mg daily for secondary stroke prevention Monitor blood pressure ideal  reading 130/90 today's reading167/92 LDL cholesterol less than 70 continue statin drug Keep diabetes in good control with hemoglobin A1c less than 6.5 most recent 7.1 on 01/27/15 Exercise by  walking daily Healthy diet low-fat stay well-hydrated Follow-up in 6 months Dennie Bible, [facility], [facility] [address], [facility] [address] [place], [place] [address] [phone]  I reviewed the above note and documentation by the Nurse Practitioner and agree with the history, physical exam, assessment and plan as outlined above. I was immediately available for face-to-face consultation. Star Age, MD, PhD [facility])

## 2015-03-10 ENCOUNTER — Encounter: Payer: Self-pay | Admitting: Internal Medicine

## 2015-03-17 ENCOUNTER — Ambulatory Visit (INDEPENDENT_AMBULATORY_CARE_PROVIDER_SITE_OTHER): Payer: Medicare HMO | Admitting: *Deleted

## 2015-03-17 DIAGNOSIS — I639 Cerebral infarction, unspecified: Secondary | ICD-10-CM | POA: Diagnosis not present

## 2015-03-18 NOTE — Progress Notes (Signed)
Carelink Summary Report / Loop Recorder 

## 2015-03-19 DIAGNOSIS — R972 Elevated prostate specific antigen [PSA]: Secondary | ICD-10-CM | POA: Diagnosis not present

## 2015-03-24 ENCOUNTER — Telehealth: Payer: Self-pay | Admitting: *Deleted

## 2015-03-24 NOTE — Telephone Encounter (Signed)
Attempted to reach patient at home and cell numbers, no answer, unable to leave message.  Trying to reach patient to request that he send a manual transmission.

## 2015-03-25 DIAGNOSIS — H25812 Combined forms of age-related cataract, left eye: Secondary | ICD-10-CM | POA: Diagnosis not present

## 2015-03-25 DIAGNOSIS — H5310 Unspecified subjective visual disturbances: Secondary | ICD-10-CM | POA: Diagnosis not present

## 2015-03-25 DIAGNOSIS — H43813 Vitreous degeneration, bilateral: Secondary | ICD-10-CM | POA: Diagnosis not present

## 2015-03-25 DIAGNOSIS — E119 Type 2 diabetes mellitus without complications: Secondary | ICD-10-CM | POA: Diagnosis not present

## 2015-03-25 LAB — HM DIABETES EYE EXAM

## 2015-03-26 DIAGNOSIS — N401 Enlarged prostate with lower urinary tract symptoms: Secondary | ICD-10-CM | POA: Diagnosis not present

## 2015-03-26 DIAGNOSIS — N5201 Erectile dysfunction due to arterial insufficiency: Secondary | ICD-10-CM | POA: Diagnosis not present

## 2015-03-26 DIAGNOSIS — R972 Elevated prostate specific antigen [PSA]: Secondary | ICD-10-CM | POA: Diagnosis not present

## 2015-03-26 DIAGNOSIS — Z0000 Encounter for general adult medical examination without abnormal findings: Secondary | ICD-10-CM | POA: Diagnosis not present

## 2015-03-26 DIAGNOSIS — R3916 Straining to void: Secondary | ICD-10-CM | POA: Diagnosis not present

## 2015-03-27 LAB — CUP PACEART REMOTE DEVICE CHECK: MDC IDC SESS DTM: 20161226200839

## 2015-03-29 ENCOUNTER — Encounter: Payer: Self-pay | Admitting: Internal Medicine

## 2015-03-30 NOTE — Telephone Encounter (Signed)
[facility] requesting that patient send a manual transmission for review.  Gave device clinic number for questions/concerns.

## 2015-04-06 NOTE — Telephone Encounter (Signed)
Able to reach patient.  Requested that he send a manual transmission for review.  Patient states he will send one tonight.  He denies questions or concerns at this time.

## 2015-04-09 NOTE — Telephone Encounter (Signed)
LMOM advising that transmission still has not been received.  Gave device clinic phone number for questions or concerns.

## 2015-04-12 ENCOUNTER — Encounter: Payer: Self-pay | Admitting: Internal Medicine

## 2015-04-16 ENCOUNTER — Ambulatory Visit (INDEPENDENT_AMBULATORY_CARE_PROVIDER_SITE_OTHER): Payer: Medicare HMO | Admitting: *Deleted

## 2015-04-16 DIAGNOSIS — I639 Cerebral infarction, unspecified: Secondary | ICD-10-CM

## 2015-04-19 NOTE — Progress Notes (Signed)
Carelink Summary Report / Loop Recorder 

## 2015-04-20 ENCOUNTER — Encounter: Payer: Self-pay | Admitting: Internal Medicine

## 2015-04-20 NOTE — Telephone Encounter (Signed)
Manual transmission received on 04/12/15.  Episodes printed and placed in LINQ for review by Dr. Rayann Heman.

## 2015-04-28 ENCOUNTER — Encounter: Payer: Self-pay | Admitting: Internal Medicine

## 2015-05-03 LAB — CUP PACEART REMOTE DEVICE CHECK: Date Time Interrogation Session: 20170125203812

## 2015-05-11 ENCOUNTER — Encounter: Payer: Self-pay | Admitting: Internal Medicine

## 2015-05-16 ENCOUNTER — Encounter: Payer: Self-pay | Admitting: Internal Medicine

## 2015-05-17 ENCOUNTER — Ambulatory Visit (INDEPENDENT_AMBULATORY_CARE_PROVIDER_SITE_OTHER): Payer: Medicare HMO | Admitting: *Deleted

## 2015-05-17 DIAGNOSIS — I639 Cerebral infarction, unspecified: Secondary | ICD-10-CM

## 2015-05-17 NOTE — Progress Notes (Signed)
Carelink Summary Report / Loop Recorder 

## 2015-05-21 ENCOUNTER — Other Ambulatory Visit: Payer: Self-pay | Admitting: Internal Medicine

## 2015-05-24 ENCOUNTER — Encounter: Payer: Self-pay | Admitting: Internal Medicine

## 2015-06-15 ENCOUNTER — Ambulatory Visit (INDEPENDENT_AMBULATORY_CARE_PROVIDER_SITE_OTHER): Payer: Medicare HMO | Admitting: *Deleted

## 2015-06-15 DIAGNOSIS — I639 Cerebral infarction, unspecified: Secondary | ICD-10-CM | POA: Diagnosis not present

## 2015-06-16 NOTE — Progress Notes (Signed)
Carelink Summary Report / Loop Recorder 

## 2015-06-25 ENCOUNTER — Telehealth: Payer: Self-pay | Admitting: Cardiology

## 2015-06-25 NOTE — Telephone Encounter (Signed)
Spoke w/ pt and requested that he send a manual transmission b/c his home monitor has not updated in at least 14 days.   

## 2015-07-02 ENCOUNTER — Encounter: Payer: Self-pay | Admitting: Cardiology

## 2015-07-02 LAB — CUP PACEART REMOTE DEVICE CHECK: Date Time Interrogation Session: 20170224203742

## 2015-07-02 NOTE — Progress Notes (Signed)
Carelink summary report received. Battery status OK. Normal device function. No new symptom episodes, tachy, brady, or pause episodes. 6 AF-0% of time ECGs appear SR w/ PVCs, occasional vent. undersensing noted.   Monthly summary reports and ROV/PRN

## 2015-07-15 ENCOUNTER — Encounter: Payer: Medicare HMO | Admitting: *Deleted

## 2015-07-17 LAB — CUP PACEART REMOTE DEVICE CHECK: MDC IDC SESS DTM: 20170326210541

## 2015-07-17 NOTE — Progress Notes (Signed)
Carelink summary report received. Battery status OK. Normal device function. No new symptom episodes, tachy episodes, brady, or pause episodes. 4 AF episodes, likely SR with PAC's. Monthly summary reports and ROV/PRN

## 2015-07-19 LAB — CUP PACEART REMOTE DEVICE CHECK: MDC IDC SESS DTM: 20170425213720

## 2015-07-19 NOTE — Progress Notes (Signed)
Carelink summary report received. Battery status OK. Normal device function. No new symptom episodes, tachy episodes, brady, or pause episodes. 2 AF episodes, SR w PVC's.  Monthly summary reports and ROV/PRN

## 2015-07-22 ENCOUNTER — Ambulatory Visit (INDEPENDENT_AMBULATORY_CARE_PROVIDER_SITE_OTHER): Payer: Medicare HMO | Admitting: Urgent Care

## 2015-07-22 VITALS — BP 160/90 | HR 72 | Temp 98.0°F | Resp 18 | Ht 70.0 in | Wt 170.6 lb

## 2015-07-22 DIAGNOSIS — I10 Essential (primary) hypertension: Secondary | ICD-10-CM

## 2015-07-22 DIAGNOSIS — R030 Elevated blood-pressure reading, without diagnosis of hypertension: Secondary | ICD-10-CM

## 2015-07-22 DIAGNOSIS — L989 Disorder of the skin and subcutaneous tissue, unspecified: Secondary | ICD-10-CM

## 2015-07-22 DIAGNOSIS — L089 Local infection of the skin and subcutaneous tissue, unspecified: Secondary | ICD-10-CM | POA: Diagnosis not present

## 2015-07-22 DIAGNOSIS — R2242 Localized swelling, mass and lump, left lower limb: Secondary | ICD-10-CM

## 2015-07-22 LAB — POCT CBC
Granulocyte percent: 67.5 %G (ref 37–80)
HEMATOCRIT: 43.1 % — AB (ref 43.5–53.7)
Hemoglobin: 15.3 g/dL (ref 14.1–18.1)
LYMPH, POC: 1.4 (ref 0.6–3.4)
MCH, POC: 31.3 pg — AB (ref 27–31.2)
MCHC: 35.5 g/dL — AB (ref 31.8–35.4)
MCV: 88.1 fL (ref 80–97)
MID (CBC): 0.6 (ref 0–0.9)
MPV: 7.1 fL (ref 0–99.8)
POC GRANULOCYTE: 4.1 (ref 2–6.9)
POC LYMPH %: 23.2 % (ref 10–50)
POC MID %: 9.3 %M (ref 0–12)
Platelet Count, POC: 188 10*3/uL (ref 142–424)
RBC: 4.89 M/uL (ref 4.69–6.13)
RDW, POC: 13.6 %
WBC: 6 10*3/uL (ref 4.6–10.2)

## 2015-07-22 MED ORDER — DOXYCYCLINE HYCLATE 100 MG PO CAPS: 100.0000 mg | ORAL_CAPSULE | Freq: Two times a day (BID) | ORAL | Status: DC

## 2015-07-22 NOTE — Patient Instructions (Addendum)
Incision and Drainage, Care After Refer to this sheet in the next few weeks. These instructions provide you with information on caring for yourself after your procedure. Your caregiver may also give you more specific instructions. Your treatment has been planned according to current medical practices, but problems sometimes occur. Call your caregiver if you have any problems or questions after your procedure. HOME CARE INSTRUCTIONS   If antibiotic medicine is given, take it as directed. Finish it even if you start to feel better.  Only take over-the-counter or prescription medicines for pain, discomfort, or fever as directed by your caregiver.  Keep all follow-up appointments as directed by your caregiver.  Change any bandages (dressings) as directed by your caregiver. Replace old dressings with clean dressings.  Wash your hands before and after caring for your wound. You will receive specific instructions for cleansing and caring for your wound.  SEEK MEDICAL CARE IF:   You have increased pain, swelling, or redness around the wound.  You have increased drainage, smell, or bleeding from the wound.  You have muscle aches, chills, or you feel generally sick.  You have a fever. MAKE SURE YOU:   Understand these instructions.  Will watch your condition.  Will get help right away if you are not doing well or get worse.   This information is not intended to replace advice given to you by your health care provider. Make sure you discuss any questions you have with your health care provider.   Document Released: 05/01/2011 Document Revised: 02/27/2014 Document Reviewed: 05/01/2011 Elsevier Interactive Patient Education 2016 Elsevier Inc[employer]     IF you received an x-ray today, you will receive an invoice from [facility]. Please contact [facility] at [phone] with questions or concerns regarding your invoice.   IF you received labwork today, you will receive an  invoice from [facility]. Please contact [facility] at [phone] with questions or concerns regarding your invoice.   Our billing staff will not be able to assist you with questions regarding bills from these companies.  You will be contacted with the lab results as soon as they are available. The fastest way to get your results is to activate your My Chart account. Instructions are located on the last page of this paperwork. If you have not heard from us regarding the results in 2 weeks, please contact this office.      

## 2015-07-22 NOTE — Progress Notes (Signed)
    MRN: [id number] DOB: 28-Jun-1940  Subjective:   Tony Jackson. is a 75 y.o. male presenting for chief complaint of sore on leg  Reports 1 year history of sore over his left lower leg. States that it has gotten significantly worse in the past 2 weeks. Specifically, it is more tender, more discolored. Patient has tried an antibiotic cream which he had left over from a previous prescription but this has not helped. Denies fever, drainage of pus or bleeding. Denies smoking cigarettes, chews tobacco. Admits non-compliance with his blood pressure medication. Reports having had a stroke 04/2014. Has a PCP, Dr. Silvio Pate. Denies chest pain, shob, heart racing, lower leg swelling, n/v, abdominal pain, hematuria. Denies alcohol use. Has a history of diabetes which is very well controlled.  Tony Jackson has a current medication list which includes the following prescription(s): aspirin ec, glipizide, ketoconazole, lisinopril, metformin, mometasone, simvastatin, triamcinolone cream, and sildenafil. Also has No Known Allergies.  Tony Jackson  has a past medical history of Diabetes mellitus; Hyperlipidemia; Psoriasis; Hypertension; ED (erectile dysfunction); History of nephrolithiasis; Elevated PSA; Frequency of urination; Lung nodule; CVA (cerebral infarction); and PSVT (paroxysmal supraventricular tachycardia) ([place]). Also  has past surgical history that includes Tonsillectomy (1960); NM MYOVIEW LTD; Prostate biopsy (01/05/2012); Cataract extraction w/ intraocular lens implant (Right, 03/30/12); TEE without cardioversion (N/A, 05/21/2014); and Cardiac catheterization (N/A, 09/24/2014).  Objective:   Vitals: BP 160/90 mmHg  Pulse 72  Temp(Src) 98 F (36.7 C) (Oral)  Resp 18  Ht 5\' 10"  (1.778 m)  Wt 170 lb 9.6 oz (77.384 kg)  BMI 24.48 kg/m2  SpO2 98%  Physical Exam  Constitutional: He is oriented to person, place, and time. He appears well-developed and well-nourished.  HENT:  Mouth/Throat: Oropharynx is clear and  moist.  Eyes: Pupils are equal, round, and reactive to light. No scleral icterus.  Cardiovascular: Normal rate, regular rhythm and intact distal pulses.  Exam reveals no gallop and no friction rub.   No murmur heard. Pulmonary/Chest: No respiratory distress. He has no wheezes. He has no rales.  Musculoskeletal: He exhibits no edema.  Neurological: He is alert and oriented to person, place, and time.   PROCEDURE NOTE: mass excision Verbal consent obtained. Local anesthesia with 1cc of 2% lidocaine with epinephrine. Sterile prep and drape. An elliptical incision was made extending ~0.5cm using a 15 blade, the mass was dissected from the surrounding tissue with curved hemostats, forceps and iris scissors. Wound closed with #1 5-0 Prolene sutures. Cleansed and dressed.  Assessment and Plan :   1. Mass of left lower leg 2. Sore on leg - Mass sent to derm path. Start doxycycline for infectious process. RTC in 7-10 days for suture removal.  3. Essential hypertension 4. Elevated blood pressure reading - Patient refused medication adjustments, states that he will f/u with his PCP.  Jaynee Eagles, PA-C [facility] [phone] 07/22/2015 4:08 PM

## 2015-07-25 LAB — WOUND CULTURE
GRAM STAIN: NONE SEEN
GRAM STAIN: NONE SEEN
Gram Stain: NONE SEEN

## 2015-07-28 ENCOUNTER — Ambulatory Visit (INDEPENDENT_AMBULATORY_CARE_PROVIDER_SITE_OTHER): Payer: Medicare HMO | Admitting: Internal Medicine

## 2015-07-28 ENCOUNTER — Encounter: Payer: Self-pay | Admitting: Internal Medicine

## 2015-07-28 VITALS — BP 142/84 | HR 80 | Temp 97.7°F | Wt 170.0 lb

## 2015-07-28 DIAGNOSIS — F39 Unspecified mood [affective] disorder: Secondary | ICD-10-CM

## 2015-07-28 DIAGNOSIS — E1121 Type 2 diabetes mellitus with diabetic nephropathy: Secondary | ICD-10-CM | POA: Diagnosis not present

## 2015-07-28 DIAGNOSIS — I471 Supraventricular tachycardia: Secondary | ICD-10-CM | POA: Diagnosis not present

## 2015-07-28 DIAGNOSIS — R69 Illness, unspecified: Secondary | ICD-10-CM | POA: Diagnosis not present

## 2015-07-28 DIAGNOSIS — I10 Essential (primary) hypertension: Secondary | ICD-10-CM | POA: Diagnosis not present

## 2015-07-28 LAB — RENAL FUNCTION PANEL
ALBUMIN: 4.5 g/dL (ref 3.5–5.2)
BUN: 33 mg/dL — ABNORMAL HIGH (ref 6–23)
CO2: 28 mEq/L (ref 19–32)
Calcium: 10.5 mg/dL (ref 8.4–10.5)
Chloride: 105 mEq/L (ref 96–112)
Creatinine, Ser: 1.35 mg/dL (ref 0.40–1.50)
GFR: 54.82 mL/min — AB (ref >60.00)
Glucose, Bld: 181 mg/dL — ABNORMAL HIGH (ref 70–99)
PHOSPHORUS: 2.8 mg/dL (ref 2.3–4.6)
Potassium: 4.4 mEq/L (ref 3.5–5.1)
SODIUM: 142 meq/L (ref 135–145)

## 2015-07-28 LAB — HEMOGLOBIN A1C: HEMOGLOBIN A1C: 7.4 % — AB (ref 4.6–6.5)

## 2015-07-28 NOTE — Assessment & Plan Note (Signed)
No recurrence. 

## 2015-07-28 NOTE — Progress Notes (Signed)
Subjective:    Patient ID: Tony Jackson., male    DOB: 05/29/1940, 75 y.o.   MRN: [id number]  HPI Here for follow up of diabetes and other medical conditions  Still doesn't feel great---notes this over 2 weeks Might be ready to retire Not exercising  No palpitations or racing heart ?slight heart pain last week--very vague ("not really a pain") No SOB Some dizziness at times---if he gets up quick. 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 aspirin EC 325 MG tablet Take 1 tablet (325 mg total) by mouth daily. 30 tablet 0  . doxycycline (VIBRAMYCIN) 100 MG capsule Take 1 capsule (100 mg total) by mouth 2 (two) times daily. 20 capsule 0  . glipiZIDE (GLUCOTROL XL) 5 MG 24 hr tablet Take 1 tablet (5 mg total) by mouth daily with breakfast. 90 tablet 3  . ketoconazole (NIZORAL) 2 % shampoo APPLY 1 APPLICATION TOPICALLY 2 (TWO) TIMES A WEEK. 120 mL 2  . lisinopril (PRINIVIL,ZESTRIL) 40 MG tablet Take 1 tablet (40 mg total) by mouth daily. 90 tablet 3  . metFORMIN (GLUCOPHAGE) 1000 MG tablet Take 1 tablet (1,000 mg total) by mouth 2 (two) times daily with a meal. 180 tablet 3  . mometasone (ELOCON) 0.1 % cream Apply 1 application topically 2 (two) times daily as needed (skin).     . sildenafil (VIAGRA) 100 MG tablet Take 50-100 mg by mouth daily as needed for erectile dysfunction. Reported on 07/22/2015    . simvastatin (ZOCOR) 40 MG tablet Take 1 tablet (40 mg total) by mouth at bedtime. 90 tablet 3  . triamcinolone cream (KENALOG) 0.1 % Apply 1 application topically 2 (two) times daily as needed (itching).     No current  facility-administered medications on file prior to visit.    No Known Allergies  Past Medical History  Diagnosis Date  . Diabetes mellitus   . Hyperlipidemia   . Psoriasis   . Hypertension   . ED (erectile dysfunction)   . History of nephrolithiasis   . Elevated PSA   . Frequency of urination   . Lung nodule   . CVA (cerebral infarction)   . PSVT (paroxysmal supraventricular tachycardia) [facility])     Past Surgical History  Procedure Laterality Date  . Tonsillectomy  1960  . Nm myoview ltd      normal EF 56% 03/08  . Prostate biopsy  01/05/2012    Procedure: BIOPSY TRANSRECTAL ULTRASONIC PROSTATE (TUBP);  Surgeon: [name], MD;  Location: [facility];  Service: Urology;  Laterality: N/A;  . Cataract extraction w/ intraocular lens implant Right 03/30/12    Dr Kathrin Penner  . Tee without cardioversion N/A 05/21/2014    Procedure: TRANSESOPHAGEAL ECHOCARDIOGRAM (TEE);  Surgeon: Jerline Pain, MD;  Location: [facility]COPY;  Service: Cardiovascular;  Laterality: N/A;  . 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 Cancer Mother     breast cancer  . Diabetes Mother   . Heart disease Neg Hx     Social History   Social History  . Marital  Status: Married    Spouse Name: Peter Congo  . Number of Children: 2  . Years of Education: HS   Occupational History  . 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 Smoking status: Former Smoker -- 1.00 packs/day for 5 years    Types: Cigarettes  . Smokeless tobacco: Current User    Types: Chew     Comment: QUIT SMOKING CIGARETTES 50 YRS AGO--  CHEWED TOBACCO FOR 40 YRS  . Alcohol Use: No  . Drug Use: No  . Sexual Activity: Not Currently   Other Topics Concern  . Not on file   Social History Narrative   Has living will   Requests wife as health care POA.   Would accept resuscitation attempts   Not  sure about tube feeds      Review of Systems Lesion removed left calf last week--- path negative. Some strep. Finishing doxy Had been a hard place there for many months    Objective:   Physical Exam  Constitutional: He appears well-developed and well-nourished. No distress.  Neck: Normal range of motion. Neck supple. No thyromegaly present.  Cardiovascular: Normal rate, regular rhythm, normal heart sounds and intact distal pulses.  Exam reveals no gallop.   No murmur heard. Pulmonary/Chest: Effort normal and breath sounds normal. No respiratory distress. He has no wheezes. He has no rales.  Musculoskeletal: He exhibits no edema or tenderness.  Lymphadenopathy:    He has no cervical adenopathy.  Skin:  Scabbed lesion on lateral left calf--no infection No foot lesions  Psychiatric:  Mild depression but normal appearance and speech          Assessment & Plan:

## 2015-07-28 NOTE — Assessment & Plan Note (Addendum)
Mild dysthymia meds not indicated Discussed restarting exercise

## 2015-07-28 NOTE — Assessment & Plan Note (Signed)
BP Readings from Last 3 Encounters:  07/28/15 142/84  07/22/15 160/90  03/08/15 167/92   Better No change in meds now--always higher at office and does have mild orthostatic symptoms

## 2015-07-28 NOTE — Assessment & Plan Note (Signed)
Hopefully still good control Will check labs On ACEI

## 2015-07-28 NOTE — Progress Notes (Signed)
Pre visit review using our clinic review tool, if applicable. No additional management support is needed unless otherwise documented below in the visit note. 

## 2015-07-30 ENCOUNTER — Encounter: Payer: Medicare HMO | Admitting: Urgent Care

## 2015-07-30 ENCOUNTER — Encounter: Payer: Self-pay | Admitting: Cardiology

## 2015-07-30 NOTE — Patient Instructions (Signed)
     IF you received an x-ray today, you will receive an invoice from [facility]. Please contact [facility] at [phone] with questions or concerns regarding your invoice.   IF you received labwork today, you will receive an invoice from [facility]. Please contact [facility] at [phone] with questions or concerns regarding your invoice.   Our billing staff will not be able to assist you with questions regarding bills from these companies.  You will be contacted with the lab results as soon as they are available. The fastest way to get your results is to activate your My Chart account. Instructions are located on the last page of this paperwork. If you have not heard from us regarding the results in 2 weeks, please contact this office.      

## 2015-07-30 NOTE — Progress Notes (Deleted)
    MRN: [id number] DOB: 09-16-1940  Subjective:   Tony Jackson. is a 76 y.o. male presenting for suture removal and follow up of mass excision of his left lower leg.   Patient was seen for this last on 07/22/2015. He was started on doxycycline for skin infection of his left lower leg. Today, he reports  Tony Jackson has a current medication list which includes the following prescription(s): aspirin ec, doxycycline, glipizide, ketoconazole, lisinopril, metformin, mometasone, sildenafil, simvastatin, and triamcinolone cream. Also has No Known Allergies.  Tony Jackson  has a past medical history of Diabetes mellitus; Hyperlipidemia; Psoriasis; Hypertension; ED (erectile dysfunction); History of nephrolithiasis; Elevated PSA; Frequency of urination; Lung nodule; CVA (cerebral infarction); and PSVT (paroxysmal supraventricular tachycardia) ([place]). Also  has past surgical history that includes Tonsillectomy (1960); NM MYOVIEW LTD; Prostate biopsy (01/05/2012); Cataract extraction w/ intraocular lens implant (Right, 03/30/12); TEE without cardioversion (N/A, 05/21/2014); and Cardiac catheterization (N/A, 09/24/2014).  Objective:   Vitals: BP 130/80 mmHg  Pulse 88  Temp(Src) 98 F (36.7 C) (Oral)  Resp 16  Ht 5\' 10"  (1.778 m)  Wt 173 lb (78.472 kg)  BMI 24.82 kg/m2  SpO2 97%  Physical Exam  No results found for this or any previous visit (from the past 24 hour(s)).  Assessment and Plan :     Jaynee Eagles, PA-C [facility] [phone] 07/30/2015 3:55 PM

## 2015-07-30 NOTE — Progress Notes (Signed)
Patient left without being seen. He waited in the clinic for 25 minutes before leaving. Told CMA staff that he would come back another day. This encounter was created in error - please disregard.

## 2015-08-16 ENCOUNTER — Ambulatory Visit (INDEPENDENT_AMBULATORY_CARE_PROVIDER_SITE_OTHER): Payer: Medicare HMO | Admitting: Urgent Care

## 2015-08-16 VITALS — BP 144/94 | HR 79 | Temp 98.4°F | Resp 17 | Ht 70.0 in | Wt 174.0 lb

## 2015-08-16 DIAGNOSIS — R2242 Localized swelling, mass and lump, left lower limb: Secondary | ICD-10-CM

## 2015-08-16 DIAGNOSIS — Z4802 Encounter for removal of sutures: Secondary | ICD-10-CM

## 2015-08-16 NOTE — Patient Instructions (Addendum)
Suture Removal, Care After Refer to this sheet in the next few weeks. These instructions provide you with information on caring for yourself after your procedure. Your health care provider may also give you more specific instructions. Your treatment has been planned according to current medical practices, but problems sometimes occur. Call your health care provider if you have any problems or questions after your procedure. WHAT TO EXPECT AFTER THE PROCEDURE After your stitches (sutures) are removed, it is typical to have the following:  Some discomfort and swelling in the wound area.  Slight redness in the area. HOME CARE INSTRUCTIONS   If you have skin adhesive strips over the wound area, do not take the strips off. They will fall off on their own in a few days. If the strips remain in place after 14 days, you may remove them.  Change any bandages (dressings) at least once a day or as directed by your health care provider. If the bandage sticks, soak it off with warm, soapy water.  Apply cream or ointment only as directed by your health care provider. If using cream or ointment, wash the area with soap and water 2 times a day to remove all the cream or ointment. Rinse off the soap and pat the area dry with a clean towel.  Keep the wound area dry and clean. If the bandage becomes wet or dirty, or if it develops a bad smell, change it as soon as possible.  Continue to protect the wound from injury.  Use sunscreen when out in the sun. New scars become sunburned easily. SEEK MEDICAL CARE IF:  You have increasing redness, swelling, or pain in the wound.  You see pus coming from the wound.  You have a fever.  You notice a bad smell coming from the wound or dressing.  Your wound breaks open (edges not staying together).   This information is not intended to replace advice given to you by your health care provider. Make sure you discuss any questions you have with your health care  provider.   Document Released: 11/01/2000 Document Revised: 11/27/2012 Document Reviewed: 09/18/2012 Elsevier Interactive Patient Education 2016 [employer].     IF you received an x-ray today, you will receive an invoice from [facility]. Please contact [facility] at [phone] with questions or concerns regarding your invoice.   IF you received labwork today, you will receive an invoice from [employer]. Please contact [facility] at [phone] with questions or concerns regarding your invoice.   Our billing staff will not be able to assist you with questions regarding bills from these companies.  You will be contacted with the lab results as soon as they are available. The fastest way to get your results is to activate your My Chart account. Instructions are located on the last page of this paperwork. If you have not heard from [place] regarding the results in 2 weeks, please contact this office.

## 2015-08-16 NOTE — Progress Notes (Signed)
   Patient: Tony Jackson [id number]  Subjective: Grayer is returning for suture removal. Patient was initially seen 07/22/2015 and had incision and drainage, mass removal, 1 suture placed thereafter. Biopsy was negative. Denies fever, drainage of pus or blood, wound dehiscence, edema, pain.   Objective: Physical Exam  Constitutional: He is oriented to person, place, and time and well-developed, well-nourished, and in no distress.  Cardiovascular: Normal rate.   Pulmonary/Chest: Effort normal.  Musculoskeletal:       Legs: Neurological: He is alert and oriented to person, place, and time.    #1 sutures removed without incident. Patient tolerated this well.  Assessment and Plan: Well-healed wound. Anticipatory guidance provided. Return to clinic as needed.  Jaynee Eagles, PA-C [facility] [phone] 08/16/2015  9:07 AM

## 2015-09-06 ENCOUNTER — Encounter: Payer: Self-pay | Admitting: Nurse Practitioner

## 2015-09-06 ENCOUNTER — Ambulatory Visit (INDEPENDENT_AMBULATORY_CARE_PROVIDER_SITE_OTHER): Payer: Medicare HMO | Admitting: Nurse Practitioner

## 2015-09-06 VITALS — BP 163/93 | HR 68 | Ht 70.0 in | Wt 174.0 lb

## 2015-09-06 DIAGNOSIS — I63429 Cerebral infarction due to embolism of unspecified anterior cerebral artery: Secondary | ICD-10-CM

## 2015-09-06 DIAGNOSIS — I10 Essential (primary) hypertension: Secondary | ICD-10-CM

## 2015-09-06 DIAGNOSIS — E7800 Pure hypercholesterolemia, unspecified: Secondary | ICD-10-CM

## 2015-09-06 NOTE — Patient Instructions (Signed)
Continue aspirin 325 mg daily for secondary stroke prevention Monitor blood pressure ideal reading 130/90 today's reading167/92 continue Lisinopril Keep LDL cholesterol less than 70 continue statin drug Zocor Keep diabetes in good control with hemoglobin A1c less than 6.5 most recent 7.1 on 07/28/15 Exercise by walking daily Healthy diet low-fat stay well-hydrated Follow-up in 1 year

## 2015-09-06 NOTE — Progress Notes (Signed)
I agree with the above plan 

## 2015-09-06 NOTE — Progress Notes (Signed)
[facility]  PATIENT: Tony Jackson. DOB: [birth year]   REASON FOR VISIT:  Follow up for cryptogenic CVA, March 2016, risk factors of hypertension,  Hyperlipidemia, DM HISTORY FROM:patient    HISTORY OF PRESENT ILLNESS:Tony Jackson 74 year old male returns for 6 month follow-up.  He has a history of cryptogenic stroke admitted to the hospital on 05/19/2014. He had sudden onset of inability to move the left side and heaviness while pushing a wheelbarrow. He has risk factors of diabetes, hypertension, hyperlipidemia. He is currently on aspirin 325 daily without further stroke or TIA symptoms. Blood pressure is elevated today at 168/93. Patient does not check his blood pressure at home and he claims he takes Lisinopril 40 mg daily. He exercises some by walking .  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 He's had a cardiac ablation. He returns for reevaluation. He has not had further stroke or TIA symptoms HISTORY PS [birth year] year old male seen today for the first office follow-up visit for hospital admission for stroke on 05/19/14. 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 He stumbled and fell a couple of times. He presented beyond time window for intervention. MRI scan of the brain showed a small acute right frontal MCA branch infarct. MRA of the brain showed focal mid to high-grade stenosis of proximal right M2 branch but without large vessel occlusion. Echocardiogram showed normal ejection fraction. TEE showed no cardiac source of embolism or PFO. Carotid ultrasound showed no significant extra cranial stenosis. Urine drug screen was negative. Hemoglobin A1c was elevated at 7.3. LDL cholesterol was 71, HDL 41, triglycerides were elevated at 279 and total cholesterol was 168 mg percent. Patient was started on aspirin for stroke prevention and had a loop recorder inserted. He states he  is had significant improvement in his left-sided strength and has only minor diminished fine motor skills in his left hand and does occasionally get tired. He was readmitted a few weeks later with supraventricular tachycardia and has been started on diltiazem. He has not been found to have atrial fibrillation on loop recorder monitor yet .He has an upcoming appointment for electrical physiological testing but has concerns about that. He states his blood pressure has been slightly elevated since his blood pressure medicine was changed to diltiazem. He is tolerating aspirin well without bleeding or bruising. He states his cholesterol has been under good control as have been her sugars.   REVIEW OF SYSTEMS: Full 14 system review of systems performed and notable only for those listed, all others are neg:  Constitutional: neg  Cardiovascular: neg Ear/Nose/Throat: neg  Skin: neg Eyes: neg Respiratory: neg Gastroitestinal: urinary urgency Hematology/Lymphatic: neg  Endocrine: neg Musculoskeletal:neg Allergy/Immunology: neg Neurological:  Memory loss dizziness, occasional headache Psychiatric: neg Sleep : neg   ALLERGIES: No Known Allergies  HOME MEDICATIONS: Outpatient Prescriptions Prior to Visit  Medication Sig Dispense Refill  . aspirin EC 325 MG tablet Take 1 tablet (325 mg total) by mouth daily. 30 tablet 0  . glipiZIDE (GLUCOTROL XL) 5 MG 24 hr tablet Take 1 tablet (5 mg total) by mouth daily with breakfast. 90 tablet 3  . ketoconazole (NIZORAL) 2 % shampoo APPLY 1 APPLICATION TOPICALLY 2 (TWO) TIMES A WEEK. 120 mL 2  . lisinopril (PRINIVIL,ZESTRIL) 40 MG tablet Take 1 tablet (40 mg total) by mouth daily. 90 tablet 3  . metFORMIN (GLUCOPHAGE) 1000 MG tablet Take 1 tablet (1,000 mg  total) by mouth 2 (two) times daily with a meal. 180 tablet 3  . mometasone (ELOCON) 0.1 % cream Apply 1 application topically 2 (two) times daily as needed (skin).     . sildenafil (VIAGRA) 100 MG tablet  Take 50-100 mg by mouth daily as needed for erectile dysfunction. Reported on 07/22/2015    . simvastatin (ZOCOR) 40 MG tablet Take 1 tablet (40 mg total) by mouth at bedtime. 90 tablet 3  . triamcinolone cream (KENALOG) 0.1 % Apply 1 application topically 2 (two) times daily as needed (itching).    Marland Kitchen doxycycline (VIBRAMYCIN) 100 MG capsule Take 1 capsule (100 mg total) by mouth 2 (two) times daily. (Patient not taking: Reported on 09/06/2015) 20 capsule 0   No facility-administered medications prior to visit.    PAST MEDICAL HISTORY: Past Medical History  Diagnosis Date  . Diabetes mellitus   . Hyperlipidemia   . Psoriasis   . Hypertension   . ED (erectile dysfunction)   . History of nephrolithiasis   . Elevated PSA   . Frequency of urination   . Lung nodule   . CVA (cerebral infarction)   . PSVT (paroxysmal supraventricular tachycardia) ([place])     PAST SURGICAL HISTORY: Past Surgical History  Procedure Laterality Date  . Tonsillectomy  1960  . Nm myoview ltd      normal EF 56% 03/08  . Prostate biopsy  01/05/2012    Procedure: BIOPSY TRANSRECTAL ULTRASONIC PROSTATE (TUBP);  Surgeon: [name], MD;  Location: [facility];  Service: Urology;  Laterality: N/A;  . Cataract extraction w/ intraocular lens implant Right 03/30/12    Dr Kathrin Penner  . Tee without cardioversion N/A 05/21/2014    Procedure: TRANSESOPHAGEAL ECHOCARDIOGRAM (TEE);  Surgeon: Jerline Pain, MD;  Location: [facility];  Service: Cardiovascular;  Laterality: N/A;  . 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 Cancer Mother     breast cancer  . Diabetes Mother   . Heart disease Neg Hx     SOCIAL HISTORY: Social History   Social History  . Marital Status: Married    Spouse Name: Tony Jackson  . Number of Children: 2  . Years of Education: HS   Occupational History  . 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 Smoking status: Former Smoker -- 1.00 packs/day for 5 years    Types: Cigarettes  . Smokeless tobacco: Current User    Types: Chew     Comment: QUIT SMOKING CIGARETTES 50 YRS AGO--  CHEWED TOBACCO FOR 40 YRS  . Alcohol Use: No  . Drug Use: No  . Sexual Activity: Not Currently   Other Topics Concern  . Not on file   Social History Narrative   Has living will   Requests wife as health care POA.   Would accept resuscitation attempts   Not sure about tube feeds        PHYSICAL EXAM  Filed Vitals:   09/06/15 0821  BP: 163/93  Pulse: 68  Height: 5\' 10"  (1.778 m)  Weight: 174 lb (78.926 kg)   Body mass index is 24.97 kg/(m^2). General: well developed, well nourished, seated, in no evident distress Head: head normocephalic and atraumatic.  Neck: supple with no carotid or supraclavicular bruits Cardiovascular: regular rate and rhythm, no murmurs Musculoskeletal: no deformity Skin:  no rash/ minimal brising Vascular: Normal pulses all extremities  Neurologic Exam Mental Status: Awake and fully alert. Oriented to place and time. Recent and remote memory intact. Attention span, concentration and fund of knowledge appropriate. Mood and affect appropriate. Mini-Mental status exam scored 29/30 with deficits in 1 of 3 recall. Animal fluency test 13. Clock drawing 4/4.  Cranial Nerves: Fundoscopic exam deferred. Pupils equal, briskly reactive to light. Extraocular movements full without nystagmus. Visual fields full to confrontation. Hearing intact. Facial sensation intact. Face, tongue, palate moves normally and symmetrically.  Motor: Normal bulk and tone. Normal strength in all tested extremity muscles. Diminished fine finger movements on the left.  Sensory.: intact to touch ,pinprick . and vibratory sensation in  upper and lower extremities.  Coordination: Rapid alternating movements normal in all extremities.  Finger-to-nose and heel-to-shin performed accurately bilaterally. Gait and Station: Arises from chair without difficulty. Stance is normal. Gait demonstrates normal stride length and balance . Able to heel, toe and tandem walk without difficulty.  Reflexes: 1+ and symmetric. Toes downgoing.   DIAGNOSTIC DATA (LABS, IMAGING, TESTING) - I reviewed patient records, labs, notes, testing and imaging myself where available.  Lab Results  Component Value Date   WBC 6.0 07/22/2015   HGB 15.3 07/22/2015   HCT 43.1* 07/22/2015   MCV 88.1 07/22/2015   PLT 196.0 01/27/2015      Component Value Date/Time   NA 142 07/28/2015 0832   K 4.4 07/28/2015 0832   CL 105 07/28/2015 0832   CO2 28 07/28/2015 0832   GLUCOSE 181* 07/28/2015 0832   BUN 33* 07/28/2015 0832   CREATININE 1.35 07/28/2015 0832   CREATININE 1.25 07/31/2014 1633   CALCIUM 10.5 07/28/2015 0832   PROT 6.9 01/27/2015 0933   ALBUMIN 4.5 07/28/2015 0832   AST 17 01/27/2015 0933   ALT 26 01/27/2015 0933   ALKPHOS 47 01/27/2015 0933   BILITOT 0.7 01/27/2015 0933   GFRNONAA 24* 07/17/2014 0824   GFRAA 28* 07/17/2014 0824   Lab Results  Component Value Date   CHOL 204* 01/27/2015   HDL 45.30 01/27/2015   LDLCALC 71 05/20/2014   LDLDIRECT 117.0 01/27/2015   TRIG 228.0* 01/27/2015   CHOLHDL 5 01/27/2015   Lab Results  Component Value Date   HGBA1C 7.4* 07/28/2015    ASSESSMENT AND PLAN 23 year Caucasian male with right middle cerebral artery branch embolic infarct likely of cryptogenic etiology in March 2016 . Vascular risk factors of hypertension, hyperlipidemia, diabetes.Recent diagnosis of supraventricular tachycardia but atrial fibrillation has not yet been found. Cardiac ablation last September 2016, SPT resolved post-ablation. No atrial fib detected on LINQ.  Continue aspirin 325 mg daily for secondary stroke prevention Monitor blood pressure ideal reading 130/90 today's reading168/93 LDL cholesterol less than 70  continue statin drug Keep diabetes in good control with hemoglobin A1c less than 6.5 most recent 7.4 on 07/28/15 Exercise by walking daily Healthy diet low-fat stay well-hydrated Follow-up in 1 year Dennie Bible, [facility], [facility], [facility] [address] [place], [place] [address] [phone]

## 2015-10-27 DIAGNOSIS — R972 Elevated prostate specific antigen [PSA]: Secondary | ICD-10-CM | POA: Diagnosis not present

## 2015-11-03 DIAGNOSIS — R350 Frequency of micturition: Secondary | ICD-10-CM | POA: Diagnosis not present

## 2015-11-03 DIAGNOSIS — N5201 Erectile dysfunction due to arterial insufficiency: Secondary | ICD-10-CM | POA: Diagnosis not present

## 2015-11-03 DIAGNOSIS — N401 Enlarged prostate with lower urinary tract symptoms: Secondary | ICD-10-CM | POA: Diagnosis not present

## 2015-11-03 DIAGNOSIS — R972 Elevated prostate specific antigen [PSA]: Secondary | ICD-10-CM | POA: Diagnosis not present

## 2015-11-12 ENCOUNTER — Other Ambulatory Visit: Payer: Self-pay

## 2015-11-12 MED ORDER — GLIPIZIDE ER 5 MG PO TB24: 5.0000 mg | ORAL_TABLET | Freq: Every day | ORAL | 3 refills | Status: DC

## 2015-11-12 NOTE — Telephone Encounter (Signed)
Mrs Lehne [facility] signed) requesting refill glipizide to [facility]. Pt last seen 07/28/15. Refilled per protocol and Mrs Younkins voiced understanding.

## 2015-12-21 ENCOUNTER — Encounter: Payer: Self-pay | Admitting: Internal Medicine

## 2015-12-21 ENCOUNTER — Ambulatory Visit (INDEPENDENT_AMBULATORY_CARE_PROVIDER_SITE_OTHER): Payer: Medicare HMO | Admitting: Internal Medicine

## 2015-12-21 VITALS — BP 154/100 | HR 75 | Temp 98.8°F | Wt 171.0 lb

## 2015-12-21 DIAGNOSIS — I63421 Cerebral infarction due to embolism of right anterior cerebral artery: Secondary | ICD-10-CM

## 2015-12-21 DIAGNOSIS — E1165 Type 2 diabetes mellitus with hyperglycemia: Secondary | ICD-10-CM | POA: Diagnosis not present

## 2015-12-21 DIAGNOSIS — I10 Essential (primary) hypertension: Secondary | ICD-10-CM | POA: Diagnosis not present

## 2015-12-21 LAB — COMPREHENSIVE METABOLIC PANEL
ALBUMIN: 4.6 g/dL (ref 3.5–5.2)
ALK PHOS: 46 U/L (ref 39–117)
ALT: 23 U/L (ref 0–53)
AST: 17 U/L (ref 0–37)
BUN: 24 mg/dL — ABNORMAL HIGH (ref 6–23)
CALCIUM: 10.7 mg/dL — AB (ref 8.4–10.5)
CHLORIDE: 106 meq/L (ref 96–112)
CO2: 28 mEq/L (ref 19–32)
CREATININE: 1.3 mg/dL (ref 0.40–1.50)
GFR: 57.19 mL/min — ABNORMAL LOW (ref >60.00)
Glucose, Bld: 216 mg/dL — ABNORMAL HIGH (ref 70–99)
POTASSIUM: 4.7 meq/L (ref 3.5–5.1)
Sodium: 142 mEq/L (ref 135–145)
TOTAL PROTEIN: 7 g/dL (ref 6.0–8.3)
Total Bilirubin: 0.8 mg/dL (ref 0.2–1.2)

## 2015-12-21 LAB — POCT CBG (FASTING - GLUCOSE)-MANUAL ENTRY: Glucose Fasting, POC: 239 mg/dL — AB (ref 70–99)

## 2015-12-21 LAB — HEMOGLOBIN A1C: HEMOGLOBIN A1C: 7.2 % — AB (ref 4.6–6.5)

## 2015-12-21 MED ORDER — AMLODIPINE BESYLATE 5 MG PO TABS: 5.0000 mg | ORAL_TABLET | Freq: Every day | ORAL | 0 refills | Status: DC

## 2015-12-21 NOTE — Progress Notes (Signed)
Subjective:    Patient ID: Tony Jackson., male    DOB: May 21, 1940, 74 y.o.   MRN: [id number]  HPI  Pt presents to the clinic today with c/o elevated blood pressure. He started noticing that his his blood pressure was higher over the last week. They have been ranging 175/110- 194/107. He reports his BP normally ranges to 150/90.  He has been feeling very lightheaded but denies blurred vision, chest pain or shortness of breath. He has had a CVA in the past with residual difficulty with speech and left sided weakness, but denies worsening speech difficulty, difficulty swallowing, confusion or increased weakness. He is taking Lisinopril 40 mg daily, but reports he has not had has BP medication today (because he did not fix his box). His BP today is 154/100.  He also c/o hyperglycemia. He reports he checks his BS 2 x day. His BS normally ranges 150-170, but reports lately it has been as high as 304. He has been taking Metformin 1000 mg BID and Glipizide 5 mg daily. He was out of his Metformin for about 1 week but this has been over a month ago. He denies recent changes in diet, medication or activity level. His last A1C was 7.4%, 07/2015.  Review of Systems  Past Medical History:  Diagnosis Date  . CVA (cerebral infarction)   . Diabetes mellitus   . ED (erectile dysfunction)   . Elevated PSA   . Frequency of urination   . History of nephrolithiasis   . Hyperlipidemia   . Hypertension   . Lung nodule   . Psoriasis   . PSVT (paroxysmal supraventricular tachycardia) ([facility])     Current Outpatient Prescriptions  Medication Sig Dispense Refill  . aspirin EC 325 MG tablet Take 1 tablet (325 mg total) by mouth daily. 30 tablet 0  . glipiZIDE (GLUCOTROL XL) 5 MG 24 hr tablet Take 1 tablet (5 mg total) by mouth daily with breakfast. 90 tablet 3  . ketoconazole (NIZORAL) 2 % shampoo APPLY 1 APPLICATION TOPICALLY 2 (TWO) TIMES A WEEK. 120 mL 2  . lisinopril (PRINIVIL,ZESTRIL) 40 MG tablet Take 1  tablet (40 mg total) by mouth daily. 90 tablet 3  . metFORMIN (GLUCOPHAGE) 1000 MG tablet Take 1 tablet (1,000 mg total) by mouth 2 (two) times daily with a meal. 180 tablet 3  . mometasone (ELOCON) 0.1 % cream Apply 1 application topically 2 (two) times daily as needed (skin).     . sildenafil (VIAGRA) 100 MG tablet Take 50-100 mg by mouth daily as needed for erectile dysfunction. Reported on 07/22/2015    . simvastatin (ZOCOR) 40 MG tablet Take 1 tablet (40 mg total) by mouth at bedtime. 90 tablet 3  . triamcinolone cream (KENALOG) 0.1 % Apply 1 application topically 2 (two) times daily as needed (itching).     No current facility-administered medications for this visit.     No Known Allergies  Family History  Problem Relation Age of Onset  . Cancer Mother     breast cancer  . Diabetes Mother   . Heart disease Neg Hx     Social History   Social History  . Marital status: Married    Spouse name: Peter Congo  . Number of children: 2  . Years of education: HS   Occupational History  . 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 Smoking status: Former Smoker    Packs/day: 1.00  Years: 5.00    Types: Cigarettes  . Smokeless tobacco: Current User    Types: Chew     Comment: QUIT SMOKING CIGARETTES 50 YRS AGO--  CHEWED TOBACCO FOR 40 YRS  . Alcohol use No  . Drug use: No  . Sexual activity: Not Currently   Other Topics Concern  . Not on file   Social History Narrative   Has living will   Requests wife as health care POA.   Would accept resuscitation attempts   Not sure about tube feeds        Constitutional: Denies fever, malaise, fatigue, headache or abrupt weight changes.  HEENT: Denies eye pain, eye redness, ear pain, ringing in the ears, wax buildup, runny nose, nasal congestion, bloody nose, or sore throat. Respiratory: Denies difficulty breathing, shortness of breath, cough or sputum production.   Cardiovascular:  Denies chest pain, chest tightness, palpitations or swelling in the hands or feet.  Musculoskeletal: Pt reports left side weakness (baseline for him). Denies decrease in range of motion, difficulty with gait, muscle pain or joint pain and swelling.  Neurological: Pt reports difficulty with speech (baseline for him). Denies dizziness, difficulty with memory, difficulty with speech or problems with balance and coordination.    No other specific complaints in a complete review of systems (except as listed in HPI above).     Objective:   Physical Exam  BP (!) 154/100   Pulse 75   Temp 98.8 F (37.1 C) (Oral)   Wt 171 lb (77.6 kg)   SpO2 98%   BMI 24.54 kg/m  Wt Readings from Last 3 Encounters:  12/21/15 171 lb (77.6 kg)  09/06/15 174 lb (78.9 kg)  08/16/15 174 lb (78.9 kg)    General: Appears his stated age, well developed, well nourished in NAD. HEENT: Head: normal shape and size; Eyes: sclera white, no icterus, conjunctiva pink, PERRLA and EOMs intact;  Cardiovascular: Normal rate and rhythm. S1,S2 noted.  No murmur, rubs or gallops noted. No JVD or BLE edema.  Pulmonary/Chest: Normal effort and positive vesicular breath sounds. No respiratory distress. No wheezes, rales or ronchi noted.  Musculoskeletal: Strength 4/5 LUE, 5/5 RUE. No difficulty with gait.  Neurological: Alert and oriented. Coordination normal just slowed.   BMET    Component Value Date/Time   NA 142 07/28/2015 0832   K 4.4 07/28/2015 0832   CL 105 07/28/2015 0832   CO2 28 07/28/2015 0832   GLUCOSE 181 (H) 07/28/2015 0832   BUN 33 (H) 07/28/2015 0832   CREATININE 1.35 07/28/2015 0832   CREATININE 1.25 07/31/2014 1633   CALCIUM 10.5 07/28/2015 0832   GFRNONAA 24 (L) 07/17/2014 0824   GFRAA 28 (L) 07/17/2014 0824    Lipid Panel     Component Value Date/Time   CHOL 204 (H) 01/27/2015 0933   TRIG 228.0 (H) 01/27/2015 0933   HDL 45.30 01/27/2015 0933   CHOLHDL 5 01/27/2015 0933   VLDL 45.6 (H)  01/27/2015 0933   LDLCALC 71 05/20/2014 1310    CBC    Component Value Date/Time   WBC 6.0 07/22/2015 1642   WBC 7.6 01/27/2015 0933   RBC 4.89 07/22/2015 1642   RBC 5.19 01/27/2015 0933   HGB 15.3 07/22/2015 1642   HGB 15.5 01/27/2015 0933   HCT 43.1 (A) 07/22/2015 1642   HCT 47.1 01/27/2015 0933   PLT 196.0 01/27/2015 0933   MCV 88.1 07/22/2015 1642   MCH 31.3 (A) 07/22/2015 1642   MCH 31.3 07/17/2014 [id number]  MCHC 35.5 (A) 07/22/2015 1642   MCHC 33.0 01/27/2015 0933   RDW 13.4 01/27/2015 0933   LYMPHSABS 1.1 01/27/2015 0933   MONOABS 0.6 01/27/2015 0933   EOSABS 0.2 01/27/2015 0933   BASOSABS 0.1 01/27/2015 0933    Hgb A1C Lab Results  Component Value Date   HGBA1C 7.4 (H) 07/28/2015         Assessment & Plan:   HTN:  BP not at goal Given history of CVA, will add Amlodipine 5 mg daily  DM 2 with hyperglycemia:  CMET and A1C today If A1C > 8, will increase Glipizide to 10 mg daily Continue Metformin 1000 mg BID  RTC in 2 weeks for BP check Bela Nyborg, NP

## 2015-12-21 NOTE — Patient Instructions (Signed)
Hypertension Hypertension, commonly called high blood pressure, is when the force of blood pumping through your arteries is too strong. Your arteries are the blood vessels that carry blood from your heart throughout your body. A blood pressure reading consists of a higher number over a lower number, such as 110/72. The higher number (systolic) is the pressure inside your arteries when your heart pumps. The lower number (diastolic) is the pressure inside your arteries when your heart relaxes. Ideally you want your blood pressure below 120/80. Hypertension forces your heart to work harder to pump blood. Your arteries may become narrow or stiff. Having untreated or uncontrolled hypertension can cause heart attack, stroke, kidney disease, and other problems. RISK FACTORS Some risk factors for high blood pressure are controllable. Others are not.  Risk factors you cannot control include:   Race. You may be at higher risk if you are African American.  Age. Risk increases with age.  Gender. Men are at higher risk than women before age 45 years. After age 65, women are at higher risk than men. Risk factors you can control include:  Not getting enough exercise or physical activity.  Being overweight.  Getting too much fat, sugar, calories, or salt in your diet.  Drinking too much alcohol. SIGNS AND SYMPTOMS Hypertension does not usually cause signs or symptoms. Extremely high blood pressure (hypertensive crisis) may cause headache, anxiety, shortness of breath, and nosebleed. DIAGNOSIS To check if you have hypertension, your health care provider will measure your blood pressure while you are seated, with your arm held at the level of your heart. It should be measured at least twice using the same arm. Certain conditions can cause a difference in blood pressure between your right and left arms. A blood pressure reading that is higher than normal on one occasion does not mean that you need treatment. If  it is not clear whether you have high blood pressure, you may be asked to return on a different day to have your blood pressure checked again. Or, you may be asked to monitor your blood pressure at home for 1 or more weeks. TREATMENT Treating high blood pressure includes making lifestyle changes and possibly taking medicine. Living a healthy lifestyle can help lower high blood pressure. You may need to change some of your habits. Lifestyle changes may include:  Following the DASH diet. This diet is high in fruits, vegetables, and whole grains. It is low in salt, red meat, and added sugars.  Keep your sodium intake below 2,300 mg per day.  Getting at least 30-45 minutes of aerobic exercise at least 4 times per week.  Losing weight if necessary.  Not smoking.  Limiting alcoholic beverages.  Learning ways to reduce stress. Your health care provider may prescribe medicine if lifestyle changes are not enough to get your blood pressure under control, and if one of the following is true:  You are 18-59 years of age and your systolic blood pressure is above 140.  You are 60 years of age or older, and your systolic blood pressure is above 150.  Your diastolic blood pressure is above 90.  You have diabetes, and your systolic blood pressure is over 140 or your diastolic blood pressure is over 90.  You have kidney disease and your blood pressure is above 140/90.  You have heart disease and your blood pressure is above 140/90. Your personal target blood pressure may vary depending on your medical conditions, your age, and other factors. HOME CARE INSTRUCTIONS    Have your blood pressure rechecked as directed by your health care provider.   Take medicines only as directed by your health care provider. Follow the directions carefully. Blood pressure medicines must be taken as prescribed. The medicine does not work as well when you skip doses. Skipping doses also puts you at risk for  problems.  Do not smoke.   Monitor your blood pressure at home as directed by your health care provider. SEEK MEDICAL CARE IF:   You think you are having a reaction to medicines taken.  You have recurrent headaches or feel dizzy.  You have swelling in your ankles.  You have trouble with your vision. SEEK IMMEDIATE MEDICAL CARE IF:  You develop a severe headache or confusion.  You have unusual weakness, numbness, or feel faint.  You have severe chest or abdominal pain.  You vomit repeatedly.  You have trouble breathing. MAKE SURE YOU:   Understand these instructions.  Will watch your condition.  Will get help right away if you are not doing well or get worse.   This information is not intended to replace advice given to you by your health care provider. Make sure you discuss any questions you have with your health care provider.   Document Released: 02/06/2005 Document Revised: 06/23/2014 Document Reviewed: 11/29/2012 Elsevier Interactive Patient Education 2016 [employer] Inc[employer]  

## 2016-01-05 ENCOUNTER — Ambulatory Visit (INDEPENDENT_AMBULATORY_CARE_PROVIDER_SITE_OTHER): Payer: Medicare HMO | Admitting: Internal Medicine

## 2016-01-05 ENCOUNTER — Encounter: Payer: Self-pay | Admitting: Internal Medicine

## 2016-01-05 VITALS — BP 138/90 | HR 76 | Temp 98.4°F | Wt 172.0 lb

## 2016-01-05 DIAGNOSIS — I10 Essential (primary) hypertension: Secondary | ICD-10-CM

## 2016-01-05 MED ORDER — AMLODIPINE BESYLATE 5 MG PO TABS: 5.0000 mg | ORAL_TABLET | Freq: Every day | ORAL | 3 refills | Status: DC

## 2016-01-05 NOTE — Progress Notes (Signed)
Pre visit review using our clinic review tool, if applicable. No additional management support is needed unless otherwise documented below in the visit note. 

## 2016-01-05 NOTE — Assessment & Plan Note (Signed)
BP Readings from Last 3 Encounters:  01/05/16 138/90  12/21/15 (!) 154/100  09/06/15 (!) 163/93   Good response to the amlodipine Will continue

## 2016-01-05 NOTE — Progress Notes (Signed)
Subjective:    Patient ID: Tony Jackson., male    DOB: [birth year], 75 y.o.   MRN: [id number]  HPI Here for follow up of elevated BP  Seems to be better now Not really checking himself lately Feels better--no symptoms like he had when BP was up  Still feels clumsy, etc Hasn't been exercising (still works, keeps up [employer], etc)  No chest pain No SOB Will get dizzy if he leans over---balance not great No edema  Current Outpatient Prescriptions on File Prior to Visit  Medication Sig Dispense Refill  . amLODipine (NORVASC) 5 MG tablet Take 1 tablet (5 mg total) by mouth daily. 30 tablet 0  . aspirin EC 325 MG tablet Take 1 tablet (325 mg total) by mouth daily. 30 tablet 0  . glipiZIDE (GLUCOTROL XL) 5 MG 24 hr tablet Take 1 tablet (5 mg total) by mouth daily with breakfast. 90 tablet 3  . ketoconazole (NIZORAL) 2 % shampoo APPLY 1 APPLICATION TOPICALLY 2 (TWO) TIMES A WEEK. 120 mL 2  . lisinopril (PRINIVIL,ZESTRIL) 40 MG tablet Take 1 tablet (40 mg total) by mouth daily. 90 tablet 3  . metFORMIN (GLUCOPHAGE) 1000 MG tablet Take 1 tablet (1,000 mg total) by mouth 2 (two) times daily with a meal. 180 tablet 3  . mometasone (ELOCON) 0.1 % cream Apply 1 application topically 2 (two) times daily as needed (skin).     . sildenafil (VIAGRA) 100 MG tablet Take 50-100 mg by mouth daily as needed for erectile dysfunction. Reported on 07/22/2015    . simvastatin (ZOCOR) 40 MG tablet Take 1 tablet (40 mg total) by mouth at bedtime. 90 tablet 3  . triamcinolone cream (KENALOG) 0.1 % Apply 1 application topically 2 (two) times daily as needed (itching).     No current facility-administered medications on file prior to visit.     No Known Allergies  Past Medical History:  Diagnosis Date  . CVA (cerebral infarction)   . Diabetes mellitus   . ED (erectile dysfunction)   . Elevated PSA   . Frequency of urination   . History of nephrolithiasis   . Hyperlipidemia   . Hypertension   .  Lung nodule   . Psoriasis   . PSVT (paroxysmal supraventricular tachycardia) ([place])     Past Surgical History:  Procedure Laterality Date  . CATARACT EXTRACTION W/ INTRAOCULAR LENS IMPLANT Right 03/30/12   Dr Kathrin Penner  . 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 NM MYOVIEW LTD     normal EF 56% 03/08  . PROSTATE BIOPSY  01/05/2012   Procedure: BIOPSY TRANSRECTAL ULTRASONIC PROSTATE (TUBP);  Surgeon: [name], MD;  Location[facility];  Service: Urology;  Laterality: N/A;  . TEE WITHOUT CARDIOVERSION N/A 05/21/2014   Procedure: TRANSESOPHAGEAL ECHOCARDIOGRAM (TEE);  Surgeon: Jerline Pain, MD;  Location: [facility] ENDOSCOPY;  Service: Cardiovascular;  Laterality: N/A;  . TONSILLECTOMY  1960    Family History  Problem Relation Age of Onset  . Cancer Mother     breast cancer  . Diabetes Mother   . Heart disease Neg Hx     Social History   Social History  . Marital status: Married    Spouse name: Tony Jackson  . Number of children: 2  . Years of education: HS   Occupational History  . 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 Smoking status:  Former Smoker    Packs/day: 1.00    Years: 5.00    Types: Cigarettes  . Smokeless tobacco: Current User    Types: Chew     Comment: QUIT SMOKING CIGARETTES 50 YRS AGO--  CHEWED TOBACCO FOR 40 YRS  . Alcohol use No  . Drug use: No  . Sexual activity: Not Currently   Other Topics Concern  . Not on file   Social History Narrative   Has living will   Requests wife as health care POA.   Would accept resuscitation attempts   Not sure about tube feeds      Review of Systems Occasional headaches--not new Some trouble with urinary incontinence--needs diapers    Objective:   Physical Exam  Constitutional: He appears well-developed. No distress.  Cardiovascular: Normal rate, regular rhythm and normal heart sounds.  Exam reveals no gallop.   No  murmur heard. Pulmonary/Chest: Effort normal and breath sounds normal. No respiratory distress. He has no wheezes. He has no rales.  Musculoskeletal: He exhibits no edema.  Psychiatric:  Still emotionally affected by the stroke          Assessment & Plan:

## 2016-02-18 ENCOUNTER — Encounter: Payer: Self-pay | Admitting: Internal Medicine

## 2016-02-18 ENCOUNTER — Ambulatory Visit (INDEPENDENT_AMBULATORY_CARE_PROVIDER_SITE_OTHER): Payer: Medicare HMO | Admitting: Internal Medicine

## 2016-02-18 ENCOUNTER — Telehealth: Payer: Self-pay

## 2016-02-18 VITALS — BP 150/90 | HR 80 | Ht 68.0 in | Wt 167.0 lb

## 2016-02-18 DIAGNOSIS — Z8673 Personal history of transient ischemic attack (TIA), and cerebral infarction without residual deficits: Secondary | ICD-10-CM | POA: Diagnosis not present

## 2016-02-18 DIAGNOSIS — N183 Chronic kidney disease, stage 3 unspecified: Secondary | ICD-10-CM

## 2016-02-18 DIAGNOSIS — I10 Essential (primary) hypertension: Secondary | ICD-10-CM

## 2016-02-18 DIAGNOSIS — I471 Supraventricular tachycardia: Secondary | ICD-10-CM

## 2016-02-18 DIAGNOSIS — Z0000 Encounter for general adult medical examination without abnormal findings: Secondary | ICD-10-CM

## 2016-02-18 DIAGNOSIS — E1122 Type 2 diabetes mellitus with diabetic chronic kidney disease: Secondary | ICD-10-CM

## 2016-02-18 DIAGNOSIS — Z7189 Other specified counseling: Secondary | ICD-10-CM

## 2016-02-18 DIAGNOSIS — E1121 Type 2 diabetes mellitus with diabetic nephropathy: Secondary | ICD-10-CM | POA: Diagnosis not present

## 2016-02-18 LAB — HM DIABETES FOOT EXAM

## 2016-02-18 LAB — COMPREHENSIVE METABOLIC PANEL
ALK PHOS: 48 U/L (ref 39–117)
ALT: 28 U/L (ref 0–53)
AST: 21 U/L (ref 0–37)
Albumin: 4.6 g/dL (ref 3.5–5.2)
BUN: 29 mg/dL — AB (ref 6–23)
CHLORIDE: 106 meq/L (ref 96–112)
CO2: 29 meq/L (ref 19–32)
Calcium: 10.4 mg/dL (ref 8.4–10.5)
Creatinine, Ser: 1.25 mg/dL (ref 0.40–1.50)
GFR: 59.82 mL/min — AB (ref >60.00)
GLUCOSE: 172 mg/dL — AB (ref 70–99)
POTASSIUM: 4.9 meq/L (ref 3.5–5.1)
SODIUM: 141 meq/L (ref 135–145)
Total Bilirubin: 0.6 mg/dL (ref 0.2–1.2)
Total Protein: 7 g/dL (ref 6.0–8.3)

## 2016-02-18 LAB — LIPID PANEL
CHOL/HDL RATIO: 3
Cholesterol: 166 mg/dL (ref 0–200)
HDL: 48.4 mg/dL (ref >39.00)
LDL Cholesterol: 85 mg/dL (ref 0–99)
NONHDL: 117.8
Triglycerides: 162 mg/dL — ABNORMAL HIGH (ref 0.0–149.0)
VLDL: 32.4 mg/dL (ref 0.0–40.0)

## 2016-02-18 LAB — CBC WITH DIFFERENTIAL/PLATELET
BASOS PCT: 0.7 % (ref 0.0–3.0)
Basophils Absolute: 0.1 10*3/uL (ref 0.0–0.1)
EOS PCT: 2.8 % (ref 0.0–5.0)
Eosinophils Absolute: 0.2 10*3/uL (ref 0.0–0.7)
HCT: 42 % (ref 39.0–52.0)
Hemoglobin: 14.7 g/dL (ref 13.0–17.0)
LYMPHS ABS: 1.2 10*3/uL (ref 0.7–4.0)
Lymphocytes Relative: 15.3 % (ref 12.0–46.0)
MCHC: 35 g/dL (ref 30.0–36.0)
MCV: 89 fl (ref 78.0–100.0)
MONO ABS: 0.6 10*3/uL (ref 0.1–1.0)
MONOS PCT: 7.2 % (ref 3.0–12.0)
NEUTROS ABS: 5.9 10*3/uL (ref 1.4–7.7)
NEUTROS PCT: 74 % (ref 43.0–77.0)
Platelets: 237 10*3/uL (ref 150.0–400.0)
RBC: 4.72 Mil/uL (ref 4.22–5.81)
RDW: 13.4 % (ref 11.5–15.5)
WBC: 8 10*3/uL (ref 4.0–10.5)

## 2016-02-18 LAB — T4, FREE: Free T4: 0.84 ng/dL (ref 0.60–1.60)

## 2016-02-18 MED ORDER — GLIPIZIDE ER 5 MG PO TB24: 5.0000 mg | ORAL_TABLET | Freq: Every day | ORAL | 3 refills | Status: DC

## 2016-02-18 MED ORDER — SIMVASTATIN 40 MG PO TABS: 40.0000 mg | ORAL_TABLET | Freq: Every day | ORAL | 3 refills | Status: DC

## 2016-02-18 MED ORDER — LISINOPRIL 40 MG PO TABS: 40.0000 mg | ORAL_TABLET | Freq: Every day | ORAL | 3 refills | Status: DC

## 2016-02-18 MED ORDER — KETOCONAZOLE 2 % EX SHAM: MEDICATED_SHAMPOO | CUTANEOUS | 2 refills | Status: DC

## 2016-02-18 MED ORDER — AMLODIPINE BESYLATE 10 MG PO TABS: 10.0000 mg | ORAL_TABLET | Freq: Every day | ORAL | 3 refills | Status: DC

## 2016-02-18 MED ORDER — TRIAMCINOLONE ACETONIDE 0.1 % EX CREA: 1.0000 "application " | TOPICAL_CREAM | Freq: Two times a day (BID) | CUTANEOUS | 1 refills | Status: DC | PRN

## 2016-02-18 MED ORDER — METFORMIN HCL 1000 MG PO TABS: 1000.0000 mg | ORAL_TABLET | Freq: Two times a day (BID) | ORAL | 3 refills | Status: DC

## 2016-02-18 MED ORDER — MOMETASONE FUROATE 0.1 % EX CREA: 1.0000 "application " | TOPICAL_CREAM | Freq: Two times a day (BID) | CUTANEOUS | 1 refills | Status: DC | PRN

## 2016-02-18 NOTE — Assessment & Plan Note (Signed)
BP Readings from Last 3 Encounters:  02/18/16 (!) 150/90  01/05/16 138/90  12/21/15 (!) 154/100   Up to 160/98 on recheck on right Will increase the amlodipine and recheck DASH Restart exercise

## 2016-02-18 NOTE — Telephone Encounter (Signed)
Spoke to pharmacy. Advised them we will monitor him

## 2016-02-18 NOTE — Progress Notes (Signed)
Pre visit review using our clinic review tool, if applicable. No additional management support is needed unless otherwise documented below in the visit note. 

## 2016-02-18 NOTE — Telephone Encounter (Signed)
[facility] left v/m requesting a cb about possible interaction between amlodipine and simvastatin. Needs to verify will be monitored due to potential increased side effects from simvastatin and amlodipine use. Please advise.

## 2016-02-18 NOTE — Assessment & Plan Note (Signed)
See social history 

## 2016-02-18 NOTE — Assessment & Plan Note (Signed)
I have personally reviewed the Medicare Annual Wellness questionnaire and have noted 1. The patient's medical and social history 2. Their use of alcohol, tobacco or illicit drugs 3. Their current medications and supplements 4. The patient's functional ability including ADL's, fall risks, home safety risks and hearing or visual             impairment. 5. Diet and physical activities 6. Evidence for depression or mood disorders  The patients weight, height, BMI and visual acuity have been recorded in the chart I have made referrals, counseling and provided education to the patient based review of the above and I have provided the pt with a written personalized care plan for preventive services.  I have provided you with a copy of your personalized plan for preventive services. Please take the time to review along with your updated medication list.  Colon not due till 2021--probably won't repeat May go for 1 more PSA with Dr Alinda Money Discussed restarting in the gym UTD on imms--- getting flu vaccine at pharmacy

## 2016-02-18 NOTE — Assessment & Plan Note (Signed)
Hopefully still good control ??early neuropathy Will check

## 2016-02-18 NOTE — Patient Instructions (Signed)
Please increase the amlodipine to 10mg  daily.  DASH Eating Plan DASH stands for "Dietary Approaches to Stop Hypertension." The DASH eating plan is a healthy eating plan that has been shown to reduce high blood pressure (hypertension). Additional health benefits may include reducing the risk of type 2 diabetes mellitus, heart disease, and stroke. The DASH eating plan may also help with weight loss. What do I need to know about the DASH eating plan? For the DASH eating plan, you will follow these general guidelines:  Choose foods with less than 150 milligrams of sodium per serving (as listed on the food label).  Use salt-free seasonings or herbs instead of table salt or sea salt.  Check with your health care provider or pharmacist before using salt substitutes.  Eat lower-sodium products. These are often labeled as "low-sodium" or "no salt added."  Eat fresh foods. Avoid eating a lot of canned foods.  Eat more vegetables, fruits, and low-fat dairy products.  Choose whole grains. Look for the word "whole" as the first word in the ingredient list.  Choose fish and skinless chicken or [place] more often than red meat. Limit fish, poultry, and meat to 6 oz (170 g) each day.  Limit sweets, desserts, sugars, and sugary drinks.  Choose heart-healthy fats.  Eat more home-cooked food and less restaurant, buffet, and fast food.  Limit fried foods.  Do not fry foods. Cook foods using methods such as baking, boiling, grilling, and broiling instead.  When eating at a restaurant, ask that your food be prepared with less salt, or no salt if possible. What foods can I eat? Seek help from a dietitian for individual calorie needs. Grains  Whole grain or whole wheat bread. Brown rice. Whole grain or whole wheat pasta. Quinoa, bulgur, and whole grain cereals. Low-sodium cereals. Corn or whole wheat flour tortillas. Whole grain cornbread. Whole grain crackers. Low-sodium crackers. Vegetables  Fresh or  frozen vegetables (raw, steamed, roasted, or grilled). Low-sodium or reduced-sodium tomato and vegetable juices. Low-sodium or reduced-sodium tomato sauce and paste. Low-sodium or reduced-sodium canned vegetables. Fruits  All fresh, canned (in natural juice), or frozen fruits. Meat and Other Protein Products  Ground beef (85% or leaner), grass-fed beef, or beef trimmed of fat. Skinless chicken or [place]. Ground chicken or [place]. Pork trimmed of fat. All fish and seafood. Eggs. Dried beans, peas, or lentils. Unsalted nuts and seeds. Unsalted canned beans. Dairy  Low-fat dairy products, such as skim or 1% milk, 2% or reduced-fat cheeses, low-fat ricotta or cottage cheese, or plain low-fat yogurt. Low-sodium or reduced-sodium cheeses. Fats and Oils  Tub margarines without trans fats. Light or reduced-fat mayonnaise and salad dressings (reduced sodium). Avocado. Safflower, olive, or canola oils. Natural peanut or almond butter. Other  Unsalted popcorn and pretzels. The items listed above may not be a complete list of recommended foods or beverages. Contact your dietitian for more options.  What foods are not recommended? Grains  White bread. White pasta. White rice. Refined cornbread. Bagels and croissants. Crackers that contain trans fat. Vegetables  Creamed or fried vegetables. Vegetables in a cheese sauce. Regular canned vegetables. Regular canned tomato sauce and paste. Regular tomato and vegetable juices. Fruits  Canned fruit in light or heavy syrup. Fruit juice. Meat and Other Protein Products  Fatty cuts of meat. Ribs, chicken wings, bacon, sausage, bologna, salami, chitterlings, fatback, hot dogs, bratwurst, and packaged luncheon meats. Salted nuts and seeds. Canned beans with salt. Dairy  Whole or 2% milk, cream, half-and-half, and  cream cheese. Whole-fat or sweetened yogurt. Full-fat cheeses or blue cheese. Nondairy creamers and whipped toppings. Processed cheese, cheese spreads, or  cheese curds. Condiments  Onion and garlic salt, seasoned salt, table salt, and sea salt. Canned and packaged gravies. Worcestershire sauce. Tartar sauce. Barbecue sauce. Teriyaki sauce. Soy sauce, including reduced sodium. Steak sauce. Fish sauce. Oyster sauce. Cocktail sauce. Horseradish. Ketchup and mustard. Meat flavorings and tenderizers. Bouillon cubes. Hot sauce. Tabasco sauce. Marinades. Taco seasonings. Relishes. Fats and Oils  Butter, stick margarine, lard, shortening, ghee, and bacon fat. Coconut, palm kernel, or palm oils. Regular salad dressings. Other  Pickles and olives. Salted popcorn and pretzels. The items listed above may not be a complete list of foods and beverages to avoid. Contact your dietitian for more information.  Where can I find more information? National Heart, Lung, and Blood Institute: travelstabloid.com This information is not intended to replace advice given to you by your health care provider. Make sure you discuss any questions you have with your health care provider. Document Released: 01/26/2011 Document Revised: 07/15/2015 Document Reviewed: 12/11/2012 Elsevier Interactive Patient Education  2017 [employer].

## 2016-02-18 NOTE — Assessment & Plan Note (Signed)
On ACEI  Will recheck labs 

## 2016-02-18 NOTE — Addendum Note (Signed)
Addended by: Pilar Grammes on: 02/18/2016 10:20 AM   Modules accepted: Orders

## 2016-02-18 NOTE — Telephone Encounter (Signed)
Let them know that we will monitor--he has been on the amlodipine for many months already. (just dose change)

## 2016-02-18 NOTE — Assessment & Plan Note (Signed)
No symptomatic recurrence since ablation

## 2016-02-18 NOTE — Progress Notes (Signed)
Subjective:    Patient ID: Tony Jackson., male    DOB: [birth year], 75 y.o.   MRN: [id number]  HPI Here for Medicare wellness visit and follow up of chronic health conditions Reviewed form and advanced directives Reviewed other doctors No alcohol or tobacco Hasn't been exercising--discussed. Plans to go back to gym 1 or 2 falls--doesn't remember. No injury Vision is okay-- check up due February He feels his hearing is fine--wife has some concerns Independent with instrumental ADLs No depression or anhedonia. No longer having anxiety  Ongoing monitoring from the stroke Slightly clumsiness--- has to be careful going up stairs No weakness Chronic problems with name recall, etc--but no true cognitive problems Still has implanted monitor--no notice about a fib  No recent palpitations No chest pain No SOB No dizziness or syncope---- but does not some "lightheadedness" (mostly with bending over--will tend to fall forward) No edema  Hasn't been checking sugars lately No hypoglycemic reactions No foot sores. Slight numbness in great toes but no pain  Current Outpatient Prescriptions on File Prior to Visit  Medication Sig Dispense Refill  . amLODipine (NORVASC) 5 MG tablet Take 1 tablet (5 mg total) by mouth daily. 90 tablet 3  . aspirin EC 325 MG tablet Take 1 tablet (325 mg total) by mouth daily. 30 tablet 0  . glipiZIDE (GLUCOTROL XL) 5 MG 24 hr tablet Take 1 tablet (5 mg total) by mouth daily with breakfast. 90 tablet 3  . ketoconazole (NIZORAL) 2 % shampoo APPLY 1 APPLICATION TOPICALLY 2 (TWO) TIMES A WEEK. 120 mL 2  . lisinopril (PRINIVIL,ZESTRIL) 40 MG tablet Take 1 tablet (40 mg total) by mouth daily. 90 tablet 3  . metFORMIN (GLUCOPHAGE) 1000 MG tablet Take 1 tablet (1,000 mg total) by mouth 2 (two) times daily with a meal. 180 tablet 3  . mometasone (ELOCON) 0.1 % cream Apply 1 application topically 2 (two) times daily as needed (skin).     . sildenafil (VIAGRA) 100 MG  tablet Take 50-100 mg by mouth daily as needed for erectile dysfunction. Reported on 07/22/2015    . simvastatin (ZOCOR) 40 MG tablet Take 1 tablet (40 mg total) by mouth at bedtime. 90 tablet 3  . triamcinolone cream (KENALOG) 0.1 % Apply 1 application topically 2 (two) times daily as needed (itching).     No current facility-administered medications on file prior to visit.     No Known Allergies  Past Medical History:  Diagnosis Date  . CVA (cerebral infarction)   . Diabetes mellitus   . ED (erectile dysfunction)   . Elevated PSA   . Frequency of urination   . History of nephrolithiasis   . Hyperlipidemia   . Hypertension   . Lung nodule   . Psoriasis   . PSVT (paroxysmal supraventricular tachycardia) ([place])     Past Surgical History:  Procedure Laterality Date  . CATARACT EXTRACTION W/ INTRAOCULAR LENS IMPLANT Right 03/30/12   Dr Kathrin Penner  . 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 NM MYOVIEW LTD     normal EF 56% 03/08  . PROSTATE BIOPSY  01/05/2012   Procedure: BIOPSY TRANSRECTAL ULTRASONIC PROSTATE (TUBP);  Surgeon: [name], MD;  [facility];  Service: Urology;  Laterality: N/A;  . TEE WITHOUT CARDIOVERSION N/A 05/21/2014   Procedure: TRANSESOPHAGEAL ECHOCARDIOGRAM (TEE);  Surgeon: Jerline Pain, MD;  Location: [place];  Service: Cardiovascular;  Laterality: N/A;  . TONSILLECTOMY  1960  Family History  Problem Relation Age of Onset  . Cancer Mother     breast cancer  . Diabetes Mother   . Heart disease Neg Hx     Social History   Social History  . Marital status: Married    Spouse name: Tony Jackson  . Number of children: 2  . Years of education: HS   Occupational History  . 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 Smoking status: Former Smoker    Packs/day: 1.00    Years: 5.00    Types: Cigarettes  . Smokeless tobacco: Current User    Types:  Chew     Comment: QUIT SMOKING CIGARETTES 50 YRS AGO--  CHEWED TOBACCO FOR 40 YRS  . Alcohol use No  . Drug use: No  . Sexual activity: Not Currently   Other Topics Concern  . Not on file   Social History Narrative   Has living will   Requests wife as health care POA.   Would accept resuscitation attempts   Not sure about tube feeds      Review of Systems  Appetite is good. Being careful so weight down a bit Sleeps well Wears seat belt Teeth are okay--keeps up with dentist Randol Kern) Bowels are fine. No blood in stool Voids okay. Some increased frequency at times. Stream is okay Chronic skin rash---psoriasis mostly on elbows--uses cream.  No suspicious lesions No sig back or joint pain No heartburn or swallowing problems    Objective:   Physical Exam  Constitutional: He is oriented to person, place, and time. He appears well-developed and well-nourished. No distress.  HENT:  Mouth/Throat: Oropharynx is clear and moist. No oropharyngeal exudate.  Neck: Normal range of motion. Neck supple. No thyromegaly present.  Cardiovascular: Normal rate, regular rhythm, normal heart sounds and intact distal pulses.  Exam reveals no gallop.   No murmur heard. Pulmonary/Chest: Effort normal and breath sounds normal. No respiratory distress. He has no wheezes. He has no rales.  Abdominal: Soft. There is no tenderness.  Musculoskeletal: He exhibits no edema or tenderness.  Lymphadenopathy:    He has no cervical adenopathy.  Neurological: He is alert and oriented to person, place, and time.  President--- "Dwaine Deter, Bush" [phone] [id number] Recall 3/3  Normal sensation in feet  Skin:  Mild psoriatic changes--mostly elbows  No foot lesions  Psychiatric: He has a normal mood and affect. His behavior is normal.          Assessment & Plan:

## 2016-02-18 NOTE — Assessment & Plan Note (Signed)
Doing well On asa and statin

## 2016-03-17 DIAGNOSIS — Z6826 Body mass index (BMI) 26.0-26.9, adult: Secondary | ICD-10-CM | POA: Diagnosis not present

## 2016-03-17 DIAGNOSIS — Z0000 Encounter for general adult medical examination without abnormal findings: Secondary | ICD-10-CM | POA: Diagnosis not present

## 2016-03-17 DIAGNOSIS — L409 Psoriasis, unspecified: Secondary | ICD-10-CM | POA: Diagnosis not present

## 2016-03-17 DIAGNOSIS — Z7982 Long term (current) use of aspirin: Secondary | ICD-10-CM | POA: Diagnosis not present

## 2016-03-17 DIAGNOSIS — E119 Type 2 diabetes mellitus without complications: Secondary | ICD-10-CM | POA: Diagnosis not present

## 2016-03-17 DIAGNOSIS — N402 Nodular prostate without lower urinary tract symptoms: Secondary | ICD-10-CM | POA: Diagnosis not present

## 2016-03-17 DIAGNOSIS — E784 Other hyperlipidemia: Secondary | ICD-10-CM | POA: Diagnosis not present

## 2016-03-17 DIAGNOSIS — I10 Essential (primary) hypertension: Secondary | ICD-10-CM | POA: Diagnosis not present

## 2016-03-17 DIAGNOSIS — R69 Illness, unspecified: Secondary | ICD-10-CM | POA: Diagnosis not present

## 2016-03-17 DIAGNOSIS — Z7984 Long term (current) use of oral hypoglycemic drugs: Secondary | ICD-10-CM | POA: Diagnosis not present

## 2016-03-24 DIAGNOSIS — R69 Illness, unspecified: Secondary | ICD-10-CM | POA: Diagnosis not present

## 2016-03-27 DIAGNOSIS — H52202 Unspecified astigmatism, left eye: Secondary | ICD-10-CM | POA: Diagnosis not present

## 2016-03-27 DIAGNOSIS — H43813 Vitreous degeneration, bilateral: Secondary | ICD-10-CM | POA: Diagnosis not present

## 2016-03-27 DIAGNOSIS — H25812 Combined forms of age-related cataract, left eye: Secondary | ICD-10-CM | POA: Diagnosis not present

## 2016-03-27 DIAGNOSIS — E103393 Type 1 diabetes mellitus with moderate nonproliferative diabetic retinopathy without macular edema, bilateral: Secondary | ICD-10-CM | POA: Diagnosis not present

## 2016-03-27 LAB — HM DIABETES EYE EXAM

## 2016-03-28 ENCOUNTER — Encounter: Payer: Self-pay | Admitting: Internal Medicine

## 2016-04-26 DIAGNOSIS — R972 Elevated prostate specific antigen [PSA]: Secondary | ICD-10-CM | POA: Diagnosis not present

## 2016-04-29 ENCOUNTER — Telehealth: Payer: Self-pay | Admitting: Internal Medicine

## 2016-04-29 ENCOUNTER — Encounter: Payer: Self-pay | Admitting: Family Medicine

## 2016-04-29 ENCOUNTER — Ambulatory Visit (INDEPENDENT_AMBULATORY_CARE_PROVIDER_SITE_OTHER): Payer: Medicare HMO | Admitting: Family Medicine

## 2016-04-29 DIAGNOSIS — J988 Other specified respiratory disorders: Secondary | ICD-10-CM | POA: Diagnosis not present

## 2016-04-29 MED ORDER — AZITHROMYCIN 250 MG PO TABS: ORAL_TABLET | ORAL | 0 refills | Status: DC

## 2016-04-29 MED ORDER — HYDROCOD POLST-CPM POLST ER 10-8 MG/5ML PO SUER: 5.0000 mL | Freq: Two times a day (BID) | ORAL | 0 refills | Status: DC | PRN

## 2016-04-29 NOTE — Progress Notes (Signed)
Pre visit review using our clinic review tool, if applicable. No additional management support is needed unless otherwise documented below in the visit note. 

## 2016-04-29 NOTE — Assessment & Plan Note (Signed)
New problem. Likely early bronchitis. Given age, and comorbidities, treating with azithromycin and Tussionex.

## 2016-04-29 NOTE — Patient Instructions (Signed)
Medications as prescribed.  Take care  Dr. Lacinda Axon    Acute Bronchitis, Adult Acute bronchitis is sudden (acute) swelling of the air tubes (bronchi) in the lungs. Acute bronchitis causes these tubes to fill with mucus, which can make it hard to breathe. It can also cause coughing or wheezing. In adults, acute bronchitis usually goes away within 2 weeks. A cough caused by bronchitis may last up to 3 weeks. Smoking, allergies, and asthma can make the condition worse. Repeated episodes of bronchitis may cause further lung problems, such as chronic obstructive pulmonary disease (COPD). What are the causes? This condition can be caused by germs and by substances that irritate the lungs, including:  Cold and flu viruses. This condition is most often caused by the same virus that causes a cold.  Bacteria.  Exposure to tobacco smoke, dust, fumes, and air pollution. What increases the risk? This condition is more likely to develop in people who:  Have close contact with someone with acute bronchitis.  Are exposed to lung irritants, such as tobacco smoke, dust, fumes, and vapors.  Have a weak immune system.  Have a respiratory condition such as asthma. What are the signs or symptoms? Symptoms of this condition include:  A cough.  Coughing up clear, yellow, or green mucus.  Wheezing.  Chest congestion.  Shortness of breath.  A fever.  Body aches.  Chills.  A sore throat. How is this diagnosed? This condition is usually diagnosed with a physical exam. During the exam, your health care provider may order tests, such as chest X-rays, to rule out other conditions. He or she may also:  Test a sample of your mucus for bacterial infection.  Check the level of oxygen in your blood. This is done to check for pneumonia.  Do a chest X-ray or lung function testing to rule out pneumonia and other conditions.  Perform blood tests. Your health care provider will also ask about your  symptoms and medical history. How is this treated? Most cases of acute bronchitis clear up over time without treatment. Your health care provider may recommend:  Drinking more fluids. Drinking more makes your mucus thinner, which may make it easier to breathe.  Taking a medicine for a fever or cough.  Taking an antibiotic medicine.  Using an inhaler to help improve shortness of breath and to control a cough.  Using a cool mist vaporizer or humidifier to make it easier to breathe. Follow these instructions at home: Medicines   Take over-the-counter and prescription medicines only as told by your health care provider.  If you were prescribed an antibiotic, take it as told by your health care provider. Do not stop taking the antibiotic even if you start to feel better. General instructions   Get plenty of rest.  Drink enough fluids to keep your urine clear or pale yellow.  Avoid smoking and secondhand smoke. Exposure to cigarette smoke or irritating chemicals will make bronchitis worse. If you smoke and you need help quitting, ask your health care provider. Quitting smoking will help your lungs heal faster.  Use an inhaler, cool mist vaporizer, or humidifier as told by your health care provider.  Keep all follow-up visits as told by your health care provider. This is important. How is this prevented? To lower your risk of getting this condition again:  Wash your hands often with soap and water. If soap and water are not available, use hand sanitizer.  Avoid contact with people who have cold symptoms.  Try not to touch your hands to your mouth, nose, or eyes.  Make sure to get the flu shot every year. Contact a health care provider if:  Your symptoms do not improve in 2 weeks of treatment. Get help right away if:  You cough up blood.  You have chest pain.  You have severe shortness of breath.  You become dehydrated.  You faint or keep feeling like you are going to  faint.  You keep vomiting.  You have a severe headache.  Your fever or chills gets worse. This information is not intended to replace advice given to you by your health care provider. Make sure you discuss any questions you have with your health care provider. Document Released: 03/16/2004 Document Revised: 09/01/2015 Document Reviewed: 07/28/2015 Elsevier Interactive Patient Education  2017 [employer].

## 2016-04-29 NOTE — Telephone Encounter (Signed)
°  Patient Name: Tony Jackson  DOB: [birth year]    Initial Comment Caller states husband is coughing and congested in his chest.    Nurse Assessment  Nurse: Julien Girt, RN, Almyra Free Date/Time Eilene Ghazi Time): 04/29/2016 10:01:08 AM  Confirm and document reason for call. If symptomatic, describe symptoms. ---Caller states her husband is coughing and has chest congestion. No fever. States she was seen on Friday, dx with Bronchitis and is wanting the same meds she was prescribed called in for him.  Does the patient have any new or worsening symptoms? ---Yes  Will a triage be completed? ---Yes  Related visit to physician within the last 2 weeks? ---No  Does the PT have any chronic conditions? (i.e. diabetes, asthma, etc.) ---Yes  List chronic conditions. ---Htn, Type 2 diabetes  Is this a behavioral health or substance abuse call? ---No     Guidelines    Guideline Title Affirmed Question Affirmed Notes  Cough - Acute Non-Productive SEVERE coughing spells (e.g., whooping sound after coughing, vomiting after coughing)    Final Disposition User   See Physician within [place], RN, Almyra Free    Referrals  GO TO FACILITY UNDECIDED  GO TO FACILITY UNDECIDED  [facility]   Disagree/Comply: [facility] Phone DateTime Result/Outcome Message Type Notes Thersa Salt - DO [phone] 04/29/2016 10:42:25 AM Called On Call Provider - Reached Doctor Paged Thersa Salt - DO 04/29/2016 10:43:08 AM Spoke with On Call - General Message Result Information provided. Pt' was requesting a Zpack and Inhaler. He agrees the pt will have to be seen today.

## 2016-04-29 NOTE — Progress Notes (Signed)
Subjective:  Patient ID: Tony Cohen., male    DOB: 1940-12-02  Age: 76 y.o. MRN: [id number]  CC: Cough, body aches  HPI:  76 year old male with a history of tobacco abuse, DM 2, stroke, CAD, hypertension, hyperlipidemia presents with the above complaints.  Patient states that he's been sick for the past 3 days. He's had dry cough, body aches. No fever. No shortness of breath. His wife is sick with similar symptoms. No known exacerbating or relieving factors. No other associated symptoms. No other complaints or concerns at this time.  Social Hx   Social History   Social History  . Marital status: Married    Spouse name: Tony Jackson  . Number of children: 2  . Years of education: HS   Occupational History  . 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 Smoking status: Former Smoker    Packs/day: 1.00    Years: 5.00    Types: Cigarettes  . Smokeless tobacco: Current User    Types: Chew     Comment: QUIT SMOKING CIGARETTES 50 YRS AGO--  CHEWED TOBACCO FOR 40 YRS  . Alcohol use No  . Drug use: No  . Sexual activity: Not Currently   Other Topics Concern  . None   Social History Narrative   Has living will   Requests wife as health care POA.   Would accept resuscitation attempts   Not sure about tube feeds       Review of Systems  Constitutional: Positive for fever.  Musculoskeletal:       Body aches   Objective:  BP 134/76   Pulse 85   Temp 98.3 F (36.8 C) (Oral)   Wt 170 lb 1.9 oz (77.2 kg)   SpO2 96%   BMI 25.87 kg/m   BP/Weight 04/29/2016 02/18/2016 27/04/5007  Systolic BP 381 829 937  Diastolic BP 76 90 90  Wt. (Lbs) 170.12 167 172  BMI 25.87 25.39 24.68    Physical Exam  Constitutional: He is oriented to person, place, and time. He appears well-developed. No distress.  HENT:  Mouth/Throat: Oropharynx is clear and moist.  Cardiovascular: Normal rate and regular rhythm.   Pulmonary/Chest: Effort  normal and breath sounds normal.  Neurological: He is alert and oriented to person, place, and time.  Psychiatric: He has a normal mood and affect.  Vitals reviewed.   Lab Results  Component Value Date   WBC 8.0 02/18/2016   HGB 14.7 02/18/2016   HCT 42.0 02/18/2016   PLT 237.0 02/18/2016   GLUCOSE 172 (H) 02/18/2016   CHOL 166 02/18/2016   TRIG 162.0 (H) 02/18/2016   HDL 48.40 02/18/2016   LDLDIRECT 117.0 01/27/2015   LDLCALC 85 02/18/2016   ALT 28 02/18/2016   AST 21 02/18/2016   NA 141 02/18/2016   K 4.9 02/18/2016   CL 106 02/18/2016   CREATININE 1.25 02/18/2016   BUN 29 (H) 02/18/2016   CO2 29 02/18/2016   TSH 2.955 05/20/2014   PSA 8.52 (H) 08/14/2008   INR 1.0 09/21/2014   HGBA1C 7.2 (H) 12/21/2015   MICROALBUR 2.9 (H) 10/07/2010    Assessment & Plan:   Problem List Items Addressed This Visit    Respiratory infection    New problem. Likely early bronchitis. Given age, and comorbidities, treating with azithromycin and Tussionex.      Relevant Medications   azithromycin (ZITHROMAX) 250 MG tablet  Meds ordered this encounter  Medications  . azithromycin (ZITHROMAX) 250 MG tablet    Sig: 2 tablets on Day 1, then 1 tablet daily x Day 2-5.    Dispense:  6 tablet    Refill:  0  . chlorpheniramine-HYDROcodone (TUSSIONEX PENNKINETIC ER) 10-8 MG/5ML SUER    Sig: Take 5 mLs by mouth every 12 (twelve) hours as needed.    Dispense:  115 mL    Refill:  0    Follow-up: PRN  [place]

## 2016-05-01 NOTE — Telephone Encounter (Signed)
Okay 

## 2016-05-01 NOTE — Telephone Encounter (Signed)
Per chart review tab pt seen Sat Clinic. 

## 2016-05-03 DIAGNOSIS — N401 Enlarged prostate with lower urinary tract symptoms: Secondary | ICD-10-CM | POA: Diagnosis not present

## 2016-05-03 DIAGNOSIS — R350 Frequency of micturition: Secondary | ICD-10-CM | POA: Diagnosis not present

## 2016-05-03 DIAGNOSIS — R972 Elevated prostate specific antigen [PSA]: Secondary | ICD-10-CM | POA: Diagnosis not present

## 2016-05-17 ENCOUNTER — Ambulatory Visit (INDEPENDENT_AMBULATORY_CARE_PROVIDER_SITE_OTHER): Payer: Medicare HMO | Admitting: Internal Medicine

## 2016-05-17 ENCOUNTER — Encounter: Payer: Self-pay | Admitting: Internal Medicine

## 2016-05-17 VITALS — BP 136/84 | HR 87 | Temp 98.3°F | Wt 169.0 lb

## 2016-05-17 DIAGNOSIS — I471 Supraventricular tachycardia: Secondary | ICD-10-CM | POA: Diagnosis not present

## 2016-05-17 DIAGNOSIS — I10 Essential (primary) hypertension: Secondary | ICD-10-CM

## 2016-05-17 DIAGNOSIS — N183 Chronic kidney disease, stage 3 unspecified: Secondary | ICD-10-CM

## 2016-05-17 DIAGNOSIS — E1122 Type 2 diabetes mellitus with diabetic chronic kidney disease: Secondary | ICD-10-CM

## 2016-05-17 DIAGNOSIS — E1121 Type 2 diabetes mellitus with diabetic nephropathy: Secondary | ICD-10-CM | POA: Diagnosis not present

## 2016-05-17 LAB — HEMOGLOBIN A1C: HEMOGLOBIN A1C: 7.4 % — AB (ref 4.6–6.5)

## 2016-05-17 LAB — RENAL FUNCTION PANEL
Albumin: 4.4 g/dL (ref 3.5–5.2)
BUN: 28 mg/dL — ABNORMAL HIGH (ref 6–23)
CALCIUM: 10 mg/dL (ref 8.4–10.5)
CO2: 28 mEq/L (ref 19–32)
CREATININE: 1.32 mg/dL (ref 0.40–1.50)
Chloride: 104 mEq/L (ref 96–112)
GFR: 56.13 mL/min — ABNORMAL LOW (ref >60.00)
GLUCOSE: 256 mg/dL — AB (ref 70–99)
POTASSIUM: 4.6 meq/L (ref 3.5–5.1)
Phosphorus: 2.9 mg/dL (ref 2.3–4.6)
SODIUM: 139 meq/L (ref 135–145)

## 2016-05-17 NOTE — Patient Instructions (Signed)
Please try stopping the chewing tobacco---use a 14 or 21mg  nicotine patch daily to get over the cravings.

## 2016-05-17 NOTE — Assessment & Plan Note (Signed)
On ACEI  BP better now

## 2016-05-17 NOTE — Assessment & Plan Note (Signed)
BP Readings from Last 3 Encounters:  05/17/16 136/84  04/29/16 134/76  02/18/16 (!) 150/90   Better now Will just recheck renal profile

## 2016-05-17 NOTE — Progress Notes (Signed)
Pre visit review using our clinic review tool, if applicable. No additional management support is needed unless otherwise documented below in the visit note. 

## 2016-05-17 NOTE — Assessment & Plan Note (Signed)
No symptoms of recurrence 

## 2016-05-17 NOTE — Assessment & Plan Note (Signed)
Hasn't been checking  Will check A1c

## 2016-05-17 NOTE — Progress Notes (Signed)
Subjective:    Patient ID: Tony Jackson., male    DOB: Jun 09, 1940, 76 y.o.   MRN: [id number]  HPI Here for follow up of BP after increased meds Also of diabetes  No problems with the BP meds No recent checks No headaches, chest pain, SOB No dizziness  No regular cough  Still chews tobacco Has been thinking about stopping Discussed using patch and then stopping  Not checking sugars lately  Current Outpatient Prescriptions on File Prior to Visit  Medication Sig Dispense Refill  . amLODipine (NORVASC) 10 MG tablet Take 1 tablet (10 mg total) by mouth daily. 90 tablet 3  . aspirin EC 325 MG tablet Take 1 tablet (325 mg total) by mouth daily. 30 tablet 0  . chlorpheniramine-HYDROcodone (TUSSIONEX PENNKINETIC ER) 10-8 MG/5ML SUER Take 5 mLs by mouth every 12 (twelve) hours as needed. 115 mL 0  . glipiZIDE (GLUCOTROL XL) 5 MG 24 hr tablet Take 1 tablet (5 mg total) by mouth daily with breakfast. 90 tablet 3  . ketoconazole (NIZORAL) 2 % shampoo APPLY 1 APPLICATION TOPICALLY 2 (TWO) TIMES A WEEK. 120 mL 2  . lisinopril (PRINIVIL,ZESTRIL) 40 MG tablet Take 1 tablet (40 mg total) by mouth daily. 90 tablet 3  . metFORMIN (GLUCOPHAGE) 1000 MG tablet Take 1 tablet (1,000 mg total) by mouth 2 (two) times daily with a meal. 180 tablet 3  . mometasone (ELOCON) 0.1 % cream Apply 1 application topically 2 (two) times daily as needed (skin). 45 g 1  . sildenafil (VIAGRA) 100 MG tablet Take 50-100 mg by mouth daily as needed for erectile dysfunction. Reported on 07/22/2015    . simvastatin (ZOCOR) 40 MG tablet Take 1 tablet (40 mg total) by mouth at bedtime. 90 tablet 3  . triamcinolone cream (KENALOG) 0.1 % Apply 1 application topically 2 (two) times daily as needed (itching). 30 g 1   No current facility-administered medications on file prior to visit.     No Known Allergies  Past Medical History:  Diagnosis Date  . CVA (cerebral infarction)   . Diabetes mellitus   . ED (erectile  dysfunction)   . Elevated PSA   . Frequency of urination   . History of nephrolithiasis   . Hyperlipidemia   . Hypertension   . Lung nodule   . Psoriasis   . PSVT (paroxysmal supraventricular tachycardia) ([place])     Past Surgical History:  Procedure Laterality Date  . CATARACT EXTRACTION W/ INTRAOCULAR LENS IMPLANT Right 03/30/12   Dr Kathrin Penner  . 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 NM MYOVIEW LTD     normal EF 56% 03/08  . PROSTATE BIOPSY  01/05/2012   Procedure: BIOPSY TRANSRECTAL ULTRASONIC PROSTATE (TUBP);  Surgeon: [name], MD;  Location: [facility];  Service: Urology;  Laterality: N/A;  . TEE WITHOUT CARDIOVERSION N/A 05/21/2014   Procedure: TRANSESOPHAGEAL ECHOCARDIOGRAM (TEE);  Surgeon: Jerline Pain, MD;  Location: [facility] ENDOSCOPY;  Service: Cardiovascular;  Laterality: N/A;  . TONSILLECTOMY  1960    Family History  Problem Relation Age of Onset  . Cancer Mother     breast cancer  . Diabetes Mother   . Heart disease Neg Hx     Social History   Social History  . Marital status: Married    Spouse name: Peter Congo  . Number of children: 2  . Years of education: HS   Occupational History  . 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 Smoking status: Former Smoker    Packs/day: 1.00    Years: 5.00    Types: Cigarettes  . Smokeless tobacco: Current User    Types: Chew     Comment: QUIT SMOKING CIGARETTES 50 YRS AGO--  CHEWED TOBACCO FOR 40 YRS  . Alcohol use No  . Drug use: No  . Sexual activity: Not Currently   Other Topics Concern  . Not on file   Social History Narrative   Has living will   Requests wife as health care POA.   Would accept resuscitation attempts   Not sure about tube feeds      Review of Systems Recent respiratory infection--got z-pak other clinic. Feels better now Appetite is down some Weight down slightly    Objective:   Physical  Exam  Constitutional: No distress.  Cardiovascular: Normal rate, regular rhythm, normal heart sounds and intact distal pulses.  Exam reveals no gallop.   No murmur heard. Pulmonary/Chest: Effort normal and breath sounds normal. No respiratory distress. He has no wheezes. He has no rales.  Musculoskeletal: He exhibits no edema.          Assessment & Plan:

## 2016-05-18 ENCOUNTER — Ambulatory Visit: Payer: Medicare HMO | Admitting: Internal Medicine

## 2016-08-03 ENCOUNTER — Telehealth: Payer: Self-pay | Admitting: Internal Medicine

## 2016-08-03 DIAGNOSIS — N179 Acute kidney failure, unspecified: Secondary | ICD-10-CM | POA: Insufficient documentation

## 2016-08-03 DIAGNOSIS — Z885 Allergy status to narcotic agent status: Secondary | ICD-10-CM | POA: Diagnosis not present

## 2016-08-03 DIAGNOSIS — E875 Hyperkalemia: Secondary | ICD-10-CM | POA: Diagnosis not present

## 2016-08-03 DIAGNOSIS — Z7982 Long term (current) use of aspirin: Secondary | ICD-10-CM | POA: Diagnosis not present

## 2016-08-03 DIAGNOSIS — E1065 Type 1 diabetes mellitus with hyperglycemia: Secondary | ICD-10-CM | POA: Diagnosis not present

## 2016-08-03 DIAGNOSIS — Z91128 Patient's intentional underdosing of medication regimen for other reason: Secondary | ICD-10-CM | POA: Diagnosis not present

## 2016-08-03 DIAGNOSIS — Z7984 Long term (current) use of oral hypoglycemic drugs: Secondary | ICD-10-CM | POA: Diagnosis not present

## 2016-08-03 DIAGNOSIS — R739 Hyperglycemia, unspecified: Secondary | ICD-10-CM | POA: Diagnosis not present

## 2016-08-03 DIAGNOSIS — I10 Essential (primary) hypertension: Secondary | ICD-10-CM | POA: Diagnosis not present

## 2016-08-03 DIAGNOSIS — E1101 Type 2 diabetes mellitus with hyperosmolarity with coma: Secondary | ICD-10-CM | POA: Diagnosis not present

## 2016-08-03 DIAGNOSIS — R42 Dizziness and giddiness: Secondary | ICD-10-CM | POA: Diagnosis not present

## 2016-08-03 DIAGNOSIS — Z79899 Other long term (current) drug therapy: Secondary | ICD-10-CM | POA: Diagnosis not present

## 2016-08-03 DIAGNOSIS — R69 Illness, unspecified: Secondary | ICD-10-CM | POA: Diagnosis not present

## 2016-08-03 DIAGNOSIS — Z9114 Patient's other noncompliance with medication regimen: Secondary | ICD-10-CM | POA: Diagnosis not present

## 2016-08-04 ENCOUNTER — Telehealth: Payer: Self-pay | Admitting: Internal Medicine

## 2016-08-04 DIAGNOSIS — E1101 Type 2 diabetes mellitus with hyperosmolarity with coma: Secondary | ICD-10-CM | POA: Diagnosis not present

## 2016-08-04 DIAGNOSIS — Z91148 Patient's other noncompliance with medication regimen for other reason: Secondary | ICD-10-CM | POA: Insufficient documentation

## 2016-08-04 DIAGNOSIS — Z9114 Patient's other noncompliance with medication regimen: Secondary | ICD-10-CM | POA: Diagnosis not present

## 2016-08-04 DIAGNOSIS — I10 Essential (primary) hypertension: Secondary | ICD-10-CM | POA: Diagnosis not present

## 2016-08-04 DIAGNOSIS — E875 Hyperkalemia: Secondary | ICD-10-CM | POA: Diagnosis not present

## 2016-08-04 DIAGNOSIS — N179 Acute kidney failure, unspecified: Secondary | ICD-10-CM | POA: Diagnosis not present

## 2016-08-04 NOTE — Telephone Encounter (Signed)
Pt is on vacation at [facility] and has had a couple of very unsteady moments where he almost fell. Family is worried about his sugars vs another possible stroke. Family took him to local hospital to get checked and they are keeping him overnight. He also has not eaten well and did not bring his glucometer so not sure what his sugars are running.   Pt daughter, Tony Jackson wanted me to inform you of Tony Jackson hospital trip. Pt is scheduled for Monday at 4pm. If you have any questions or concerns please call Tony Jackson at [phone].

## 2016-08-04 NOTE — Telephone Encounter (Signed)
PLEASE NOTE: All timestamps contained within this report are represented as [place] Standard Time. CONFIDENTIALTY NOTICE: This fax transmission is intended only for the addressee. It contains information that is legally privileged, confidential or otherwise protected from use or disclosure. If you are not the intended recipient, you are strictly prohibited from reviewing, disclosing, copying using or disseminating any of this information or taking any action in reliance on or regarding this information. If you have received this fax in error, please notify us immediately by telephone so that we can arrange for its return to [place]. Phone: [phone], Toll-Free: [phone], Fax: [phone] Page: 1 of 1 Call Id: [id number] [place] Patient Name: Tony Jackson Gender: Male DOB: Apr 06, 1940 Age: 76 Y 51 M 26 D Return Phone Number: [phone] (Primary) [place] [address] Client [facility] Night - Client Client Site [place] - Night Physician Viviana Simpler - MD Who Is Calling Patient / Member / Family / Caregiver Call Type Triage / Clinical Caller Name Mellody Dance Relationship To Patient Daughter Return Phone Number [phone] (Primary) Chief Complaint BLOOD SUGAR HIGH - 500 or greater Reason for Call Request to Speak to a Physician Initial Comment Callers BS is really high is over 500 and is on his why to the hospital and they wont a list of the meds he is on No Triage Reason Other Nurse Assessment Nurse: Laqueta Due, RN, Metallurgist (Eastern Time): 08/03/2016 6:42:24 PM Confirm and document reason for call. If symptomatic, describe symptoms. ---Caller states that pt has BS over 500 and currently on way via ambulance to hospital. needs list of medications. Select reason for no triage. ---Other Please document clinical  information provided and list any resource used. ---Pt already going into ER via ambulance Guidelines Guideline Title [place]. Time Eilene Ghazi Time) Disposition Final User 08/03/2016 6:53:45 PM Clinical Call Yes Laqueta Due, RN, Amber Comments User: Letta Moynahan, RN Date/Time Eilene Ghazi Time): 08/03/2016 6:47:58 PM This nurse doesn't have access to EPIC and Donnie Aho RN went into [facility] chart to access medications. User: Letta Moynahan, RN Date/Time Eilene Ghazi Time): 08/03/2016 6:53:32 PM Notified caller of patient's medication list.

## 2016-08-04 NOTE — Telephone Encounter (Signed)
No records available yet Did have not from yesterday about hyperosmolar coma (which doesn't make sense with his good control) Will await further information and try to touch base next week

## 2016-08-04 NOTE — Telephone Encounter (Signed)
[facility] called to let Dr.Letvak know patient is at their Hospital.  I asked her to fax  H and P, Test Results and Discharge Summary to [facility].

## 2016-08-04 NOTE — Telephone Encounter (Signed)
Any [place] will have access to his records and meds Will see what happens with the ER visit

## 2016-08-07 ENCOUNTER — Ambulatory Visit (INDEPENDENT_AMBULATORY_CARE_PROVIDER_SITE_OTHER): Payer: Medicare HMO | Admitting: Internal Medicine

## 2016-08-07 ENCOUNTER — Encounter: Payer: Self-pay | Admitting: Internal Medicine

## 2016-08-07 DIAGNOSIS — E1100 Type 2 diabetes mellitus with hyperosmolarity without nonketotic hyperglycemic-hyperosmolar coma (NKHHC): Secondary | ICD-10-CM | POA: Insufficient documentation

## 2016-08-07 MED ORDER — GLUCOSE BLOOD VI STRP: ORAL_STRIP | 12 refills | Status: DC

## 2016-08-07 NOTE — Addendum Note (Signed)
Addended by: Modena Nunnery on: 08/07/2016 05:06 PM   Modules accepted: Orders

## 2016-08-07 NOTE — Progress Notes (Signed)
Subjective:    Patient ID: Tony Jackson., male    DOB: 1940/11/11, 76 y.o.   MRN: [id number]  HPI Here with wife  On vacation at [facility] Had his diabetes meds but stopped them while on vacation--- and had been inconsistent with them for a while before then No clear reason for this  Went to ER at [place] facility there Having jerky movements and couldn't walk right Found to be in hyperosmolar crisis without acidemia  Admitted to ICU after glucose found to be over 1000 Creatinine up to 1.47---but back to 1.2 after hydration Given IV insulin but sent back home on routine meds Reviewed their records on Epic Sugars still running high--checking consistently since discharge 3 days ago--- close to or over 300  Current Outpatient Prescriptions on File Prior to Visit  Medication Sig Dispense Refill  . amLODipine (NORVASC) 10 MG tablet Take 1 tablet (10 mg total) by mouth daily. 90 tablet 3  . aspirin EC 325 MG tablet Take 1 tablet (325 mg total) by mouth daily. 30 tablet 0  . glipiZIDE (GLUCOTROL XL) 5 MG 24 hr tablet Take 1 tablet (5 mg total) by mouth daily with breakfast. 90 tablet 3  . ketoconazole (NIZORAL) 2 % shampoo APPLY 1 APPLICATION TOPICALLY 2 (TWO) TIMES A WEEK. 120 mL 2  . lisinopril (PRINIVIL,ZESTRIL) 40 MG tablet Take 1 tablet (40 mg total) by mouth daily. 90 tablet 3  . metFORMIN (GLUCOPHAGE) 1000 MG tablet Take 1 tablet (1,000 mg total) by mouth 2 (two) times daily with a meal. 180 tablet 3  . mometasone (ELOCON) 0.1 % cream Apply 1 application topically 2 (two) times daily as needed (skin). 45 g 1  . sildenafil (VIAGRA) 100 MG tablet Take 50-100 mg by mouth daily as needed for erectile dysfunction. Reported on 07/22/2015    . simvastatin (ZOCOR) 40 MG tablet Take 1 tablet (40 mg total) by mouth at bedtime. 90 tablet 3  . triamcinolone cream (KENALOG) 0.1 % Apply 1 application topically 2 (two) times daily as needed (itching). 30 g 1   No current  facility-administered medications on file prior to visit.     No Known Allergies  Past Medical History:  Diagnosis Date  . CVA (cerebral infarction)   . Diabetes mellitus   . ED (erectile dysfunction)   . Elevated PSA   . Frequency of urination   . History of nephrolithiasis   . Hyperlipidemia   . Hypertension   . Lung nodule   . Psoriasis   . PSVT (paroxysmal supraventricular tachycardia) ([place])     Past Surgical History:  Procedure Laterality Date  . CATARACT EXTRACTION W/ INTRAOCULAR LENS IMPLANT Right 03/30/12   Dr Kathrin Penner  . 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 NM MYOVIEW LTD     normal EF 56% 03/08  . PROSTATE BIOPSY  01/05/2012   Procedure: BIOPSY TRANSRECTAL ULTRASONIC PROSTATE (TUBP);  Surgeon: [name], MD;  Location: [facility];  Service: Urology;  Laterality: N/A;  . TEE WITHOUT CARDIOVERSION N/A 05/21/2014   Procedure: TRANSESOPHAGEAL ECHOCARDIOGRAM (TEE);  Surgeon: Jerline Pain, MD;  Location: [facility] ENDOSCOPY;  Service: Cardiovascular;  Laterality: N/A;  . TONSILLECTOMY  1960    Family History  Problem Relation Age of Onset  . Cancer Mother        breast cancer  . Diabetes Mother   . Heart disease Neg Hx     Social History  Social History  . Marital status: Married    Spouse name: Peter Congo  . Number of children: 2  . Years of education: HS   Occupational History  . 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 Smoking status: Former Smoker    Packs/day: 1.00    Years: 5.00    Types: Cigarettes  . Smokeless tobacco: Current User    Types: Chew     Comment: QUIT SMOKING CIGARETTES 50 YRS AGO--  CHEWED TOBACCO FOR 40 YRS  . Alcohol use No  . Drug use: No  . Sexual activity: Not Currently   Other Topics Concern  . Not on file   Social History Narrative   Has living will   Requests wife as health care POA.   Would accept resuscitation attempts    Not sure about tube feeds      Review of Systems No chest pain No SOB Feels some better now    Objective:   Physical Exam  Constitutional: He appears well-nourished. No distress.  Neck: No thyromegaly present.  Cardiovascular: Normal rate, regular rhythm and normal heart sounds.  Exam reveals no gallop.   No murmur heard. Pulmonary/Chest: Effort normal and breath sounds normal. No respiratory distress. He has no wheezes. He has no rales.  Abdominal: Soft. There is no tenderness.  Musculoskeletal: Normal range of motion. He exhibits no edema.  Lymphadenopathy:    He has no cervical adenopathy.          Assessment & Plan:

## 2016-08-07 NOTE — Patient Instructions (Signed)
If your fasting morning sugars stay over 200 in 1 week, increase the glipizide to 5mg  twice a day (add one before dinner).

## 2016-08-07 NOTE — Assessment & Plan Note (Signed)
[place] Had been noncompliant with diet and stopped his meds Had urinary incontinence for a month--sugar probably high all that time Still running high Discussed proper eating and increase water If sugar stays up, will increase glipizide to 5 bid May need insulin Recheck 1 month

## 2016-08-10 ENCOUNTER — Telehealth: Payer: Self-pay | Admitting: *Deleted

## 2016-08-10 DIAGNOSIS — R69 Illness, unspecified: Secondary | ICD-10-CM | POA: Diagnosis not present

## 2016-08-10 MED ORDER — ONETOUCH VERIO W/DEVICE KIT: 1.0000 | PACK | Freq: Once | 0 refills | Status: AC

## 2016-08-10 MED ORDER — ONETOUCH DELICA LANCING DEV MISC: 1.0000 [IU] | Freq: Once | 0 refills | Status: AC

## 2016-08-10 MED ORDER — ONETOUCH DELICA LANCETS FINE MISC: 3 refills | Status: AC

## 2016-08-10 MED ORDER — GLUCOSE BLOOD VI STRP: ORAL_STRIP | 3 refills | Status: DC

## 2016-08-10 NOTE — Addendum Note (Signed)
Addended by: Pilar Grammes on: 08/10/2016 02:51 PM   Modules accepted: Orders

## 2016-08-10 NOTE — Telephone Encounter (Signed)
One Touch system sent to [facility]

## 2016-08-10 NOTE — Telephone Encounter (Signed)
Pharmacy left vm at triage indicating test strips requested are no longer manufactured. Lm on pts vm and advised per pharmacy. Pt advised to contact insurance company to confirm which meter and DM supplies are covered and to contact office back for new Rx

## 2016-08-10 NOTE — Telephone Encounter (Addendum)
Mrs Schoenberger left v/m that ins will pay for onetouch glucometer.Mrs Corkum request new glucometer with test strips.

## 2016-08-10 NOTE — Telephone Encounter (Signed)
Thank you. I will wait to hear from him.

## 2016-08-29 DIAGNOSIS — H43813 Vitreous degeneration, bilateral: Secondary | ICD-10-CM | POA: Diagnosis not present

## 2016-08-29 DIAGNOSIS — H35363 Drusen (degenerative) of macula, bilateral: Secondary | ICD-10-CM | POA: Diagnosis not present

## 2016-08-29 DIAGNOSIS — E113293 Type 2 diabetes mellitus with mild nonproliferative diabetic retinopathy without macular edema, bilateral: Secondary | ICD-10-CM | POA: Diagnosis not present

## 2016-09-05 ENCOUNTER — Ambulatory Visit: Payer: Medicare HMO | Admitting: Nurse Practitioner

## 2016-09-06 ENCOUNTER — Ambulatory Visit (INDEPENDENT_AMBULATORY_CARE_PROVIDER_SITE_OTHER): Payer: Medicare HMO | Admitting: Internal Medicine

## 2016-09-06 ENCOUNTER — Encounter: Payer: Self-pay | Admitting: Internal Medicine

## 2016-09-06 DIAGNOSIS — Z23 Encounter for immunization: Secondary | ICD-10-CM | POA: Diagnosis not present

## 2016-09-06 DIAGNOSIS — E1121 Type 2 diabetes mellitus with diabetic nephropathy: Secondary | ICD-10-CM | POA: Diagnosis not present

## 2016-09-06 NOTE — Assessment & Plan Note (Signed)
Back under control Okay to only take the glipizide daily---unless sugars are over 130 fasting regularly Counseled on compliance again

## 2016-09-06 NOTE — Patient Instructions (Signed)
It is fine to reduce the glipizide back to once a day--5mg . If you are consistently getting over 130 for fasting sugar, increase back to 2 a day.

## 2016-09-06 NOTE — Progress Notes (Signed)
Subjective:    Patient ID: Tony Jackson., male    DOB: [birth year], 76 y.o.   MRN: [id number]  HPI Here for follow up of diabetes---after non ketotic hyperosmolar crisis from stopping meds a month ago  Now taking both medications regularly again Had missed some doses before stopping altogether on vacation Now seeing very low sugars 65 this AM--felt fine (despite big meal last night)---did drink some juice and it went to 90 In past few weeks---mostly under 110 and rarely over 130 No clinical hypoglycemic spells He did increase the glipizide to bid---though sugars were better  Current Outpatient Prescriptions on File Prior to Visit  Medication Sig Dispense Refill  . amLODipine (NORVASC) 10 MG tablet Take 1 tablet (10 mg total) by mouth daily. 90 tablet 3  . aspirin EC 325 MG tablet Take 1 tablet (325 mg total) by mouth daily. 30 tablet 0  . glipiZIDE (GLUCOTROL XL) 5 MG 24 hr tablet Take 1 tablet (5 mg total) by mouth daily with breakfast. 90 tablet 3  . glucose blood (ONETOUCH VERIO) test strip Use 1 strip daily to check blood sugar Dx Code E11.21 100 each 3  . ketoconazole (NIZORAL) 2 % shampoo APPLY 1 APPLICATION TOPICALLY 2 (TWO) TIMES A WEEK. 120 mL 2  . lisinopril (PRINIVIL,ZESTRIL) 40 MG tablet Take 1 tablet (40 mg total) by mouth daily. 90 tablet 3  . metFORMIN (GLUCOPHAGE) 1000 MG tablet Take 1 tablet (1,000 mg total) by mouth 2 (two) times daily with a meal. 180 tablet 3  . mometasone (ELOCON) 0.1 % cream Apply 1 application topically 2 (two) times daily as needed (skin). 45 g 1  . ONETOUCH DELICA LANCETS FINE MISC Use 1 daily to obtain blood sample Dx Code E11.21 100 each 3  . sildenafil (VIAGRA) 100 MG tablet Take 50-100 mg by mouth daily as needed for erectile dysfunction. Reported on 07/22/2015    . simvastatin (ZOCOR) 40 MG tablet Take 1 tablet (40 mg total) by mouth at bedtime. 90 tablet 3  . triamcinolone cream (KENALOG) 0.1 % Apply 1 application topically 2 (two) times  daily as needed (itching). 30 g 1   No current facility-administered medications on file prior to visit.     No Known Allergies  Past Medical History:  Diagnosis Date  . CVA (cerebral infarction)   . Diabetes mellitus   . ED (erectile dysfunction)   . Elevated PSA   . Frequency of urination   . History of nephrolithiasis   . Hyperlipidemia   . Hypertension   . Lung nodule   . Psoriasis   . PSVT (paroxysmal supraventricular tachycardia) ([place])     Past Surgical History:  Procedure Laterality Date  . CATARACT EXTRACTION W/ INTRAOCULAR LENS IMPLANT Right 03/30/12   Dr Kathrin Penner  . 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 NM MYOVIEW LTD     normal EF 56% 03/08  . PROSTATE BIOPSY  01/05/2012   Procedure: BIOPSY TRANSRECTAL ULTRASONIC PROSTATE (TUBP);  Surgeon: [name], MD;  Location: [facility];  Service: Urology;  Laterality: N/A;  . TEE WITHOUT CARDIOVERSION N/A 05/21/2014   Procedure: TRANSESOPHAGEAL ECHOCARDIOGRAM (TEE);  Surgeon: Jerline Pain, MD;  Location: [facility] ENDOSCOPY;  Service: Cardiovascular;  Laterality: N/A;  . TONSILLECTOMY  1960    Family History  Problem Relation Age of Onset  . Cancer Mother        breast cancer  . Diabetes Mother   .  Heart disease Neg Hx     Social History   Social History  . Marital status: Married    Spouse name: Peter Congo  . Number of children: 2  . Years of education: HS   Occupational History  . 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 Smoking status: Former Smoker    Packs/day: 1.00    Years: 5.00    Types: Cigarettes  . Smokeless tobacco: Current User    Types: Chew     Comment: QUIT SMOKING CIGARETTES 50 YRS AGO--  CHEWED TOBACCO FOR 40 YRS  . Alcohol use No  . Drug use: No  . Sexual activity: Not Currently   Other Topics Concern  . Not on file   Social History Narrative   Has living will   Requests wife as health  care POA.   Would accept resuscitation attempts   Not sure about tube feeds      Review of Systems Appetite is good Weight up slightly    Objective:   Physical Exam  Constitutional: No distress.  Psychiatric: He has a normal mood and affect. His behavior is normal.          Assessment & Plan:

## 2016-09-06 NOTE — Addendum Note (Signed)
Addended by: Pilar Grammes on: 09/06/2016 11:10 AM   Modules accepted: Orders

## 2016-09-22 ENCOUNTER — Telehealth: Payer: Self-pay

## 2016-09-22 NOTE — Telephone Encounter (Signed)
Bailey with [facility] [place] left v/m wanting to ck on request for diabetic testing strips that was sent to Dr Silvio Pate; pt requested testing strips directly from [facility]. Bailey request cb.

## 2016-09-25 NOTE — Telephone Encounter (Signed)
Please check on this.

## 2016-09-25 NOTE — Telephone Encounter (Signed)
I do not recall seeing a fax for testing supplies. Dr Silvio Pate, have you seen anything?

## 2016-09-27 NOTE — Telephone Encounter (Signed)
I spoke with [place] at [facility] and she will resend the diabetic testing strips request form to fax [phone]. FYI to [employer]

## 2016-09-27 NOTE — Telephone Encounter (Signed)
Thank you. I will keep an eye out for them.

## 2016-10-10 NOTE — Telephone Encounter (Signed)
Form faxed to [facility].

## 2016-10-10 NOTE — Telephone Encounter (Signed)
Finally received the fax yesterday. Forms in Dr Alla German Inbox on his desk.

## 2016-10-10 NOTE — Telephone Encounter (Signed)
Form signed.

## 2016-10-13 DIAGNOSIS — E119 Type 2 diabetes mellitus without complications: Secondary | ICD-10-CM | POA: Diagnosis not present

## 2016-11-17 ENCOUNTER — Encounter: Payer: Self-pay | Admitting: Internal Medicine

## 2016-11-17 ENCOUNTER — Ambulatory Visit (INDEPENDENT_AMBULATORY_CARE_PROVIDER_SITE_OTHER): Payer: Medicare HMO | Admitting: Internal Medicine

## 2016-11-17 VITALS — BP 130/86 | HR 69 | Temp 97.9°F | Wt 167.0 lb

## 2016-11-17 DIAGNOSIS — Z23 Encounter for immunization: Secondary | ICD-10-CM

## 2016-11-17 DIAGNOSIS — I6930 Unspecified sequelae of cerebral infarction: Secondary | ICD-10-CM | POA: Diagnosis not present

## 2016-11-17 DIAGNOSIS — E1121 Type 2 diabetes mellitus with diabetic nephropathy: Secondary | ICD-10-CM

## 2016-11-17 DIAGNOSIS — N4231 Prostatic intraepithelial neoplasia: Secondary | ICD-10-CM

## 2016-11-17 DIAGNOSIS — E1122 Type 2 diabetes mellitus with diabetic chronic kidney disease: Secondary | ICD-10-CM | POA: Diagnosis not present

## 2016-11-17 DIAGNOSIS — I10 Essential (primary) hypertension: Secondary | ICD-10-CM

## 2016-11-17 DIAGNOSIS — N183 Chronic kidney disease, stage 3 unspecified: Secondary | ICD-10-CM

## 2016-11-17 LAB — HEMOGLOBIN A1C: HEMOGLOBIN A1C: 6.5 % (ref 4.6–6.5)

## 2016-11-17 MED ORDER — TRIAMCINOLONE ACETONIDE 0.1 % EX CREA: 1.0000 "application " | TOPICAL_CREAM | Freq: Two times a day (BID) | CUTANEOUS | 1 refills | Status: DC | PRN

## 2016-11-17 MED ORDER — KETOCONAZOLE 2 % EX SHAM: MEDICATED_SHAMPOO | CUTANEOUS | 2 refills | Status: DC

## 2016-11-17 NOTE — Assessment & Plan Note (Signed)
BP Readings from Last 3 Encounters:  11/17/16 130/86  09/06/16 132/84  08/07/16 (!) 144/70   Good control

## 2016-11-17 NOTE — Assessment & Plan Note (Signed)
On ACE-I 

## 2016-11-17 NOTE — Assessment & Plan Note (Signed)
Still keeps up with urologist

## 2016-11-17 NOTE — Assessment & Plan Note (Signed)
Mild balance problems only

## 2016-11-17 NOTE — Assessment & Plan Note (Signed)
Seems to have good control again back on meds Mild retinopathy as well

## 2016-11-17 NOTE — Addendum Note (Signed)
Addended by: Pilar Grammes on: 11/17/2016 08:58 AM   Modules accepted: Orders

## 2016-11-17 NOTE — Progress Notes (Signed)
Subjective:    Patient ID: Tony Jackson., male    DOB: June 03, 1940, 76 y.o.   MRN: [id number]  HPI Here for follow up of diabetes and other chronic health conditions  Doing okay Checking sugars most days Usually under 110---highest about 130 No apparent hypoglycemic spells  No chest pain  No SOB No syncope. Occasional mild dizziness No palpitations No edema  No problems with urination or incontinence with improvement of diabetes Does keep up with urologist due to abnormal biopsies  Current Outpatient Prescriptions on File Prior to Visit  Medication Sig Dispense Refill  . amLODipine (NORVASC) 10 MG tablet Take 1 tablet (10 mg total) by mouth daily. 90 tablet 3  . aspirin EC 325 MG tablet Take 1 tablet (325 mg total) by mouth daily. 30 tablet 0  . glipiZIDE (GLUCOTROL XL) 5 MG 24 hr tablet Take 1 tablet (5 mg total) by mouth daily with breakfast. 90 tablet 3  . glucose blood (ONETOUCH VERIO) test strip Use 1 strip daily to check blood sugar Dx Code E11.21 100 each 3  . ketoconazole (NIZORAL) 2 % shampoo APPLY 1 APPLICATION TOPICALLY 2 (TWO) TIMES A WEEK. 120 mL 2  . lisinopril (PRINIVIL,ZESTRIL) 40 MG tablet Take 1 tablet (40 mg total) by mouth daily. 90 tablet 3  . metFORMIN (GLUCOPHAGE) 1000 MG tablet Take 1 tablet (1,000 mg total) by mouth 2 (two) times daily with a meal. 180 tablet 3  . mometasone (ELOCON) 0.1 % cream Apply 1 application topically 2 (two) times daily as needed (skin). 45 g 1  . ONETOUCH DELICA LANCETS FINE MISC Use 1 daily to obtain blood sample Dx Code E11.21 100 each 3  . simvastatin (ZOCOR) 40 MG tablet Take 1 tablet (40 mg total) by mouth at bedtime. 90 tablet 3  . triamcinolone cream (KENALOG) 0.1 % Apply 1 application topically 2 (two) times daily as needed (itching). 30 g 1   No current facility-administered medications on file prior to visit.     No Known Allergies  Past Medical History:  Diagnosis Date  . CVA (cerebral infarction)   .  Diabetes mellitus   . ED (erectile dysfunction)   . Elevated PSA   . Frequency of urination   . History of nephrolithiasis   . Hyperlipidemia   . Hypertension   . Lung nodule   . Psoriasis   . PSVT (paroxysmal supraventricular tachycardia) ([place])     Past Surgical History:  Procedure Laterality Date  . CATARACT EXTRACTION W/ INTRAOCULAR LENS IMPLANT Right 03/30/12   Dr Kathrin Penner  . 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 NM MYOVIEW LTD     normal EF 56% 03/08  . PROSTATE BIOPSY  01/05/2012   Procedure: BIOPSY TRANSRECTAL ULTRASONIC PROSTATE (TUBP);  Surgeon: [name], MD;  Location: [facility];  Service: Urology;  Laterality: N/A;  . TEE WITHOUT CARDIOVERSION N/A 05/21/2014   Procedure: TRANSESOPHAGEAL ECHOCARDIOGRAM (TEE);  Surgeon: Jerline Pain, MD;  Location: [facility];  Service: Cardiovascular;  Laterality: N/A;  . TONSILLECTOMY  1960    Family History  Problem Relation Age of Onset  . Cancer Mother        breast cancer  . Diabetes Mother   . Heart disease Neg Hx     Social History   Social History  . Marital status: Married    Spouse name: Peter Congo  . Number of children: 2  . Years of education: HS  Occupational History  . 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 Smoking status: Former Smoker    Packs/day: 1.00    Years: 5.00    Types: Cigarettes  . Smokeless tobacco: Current User    Types: Chew     Comment: QUIT SMOKING CIGARETTES 50 YRS AGO--  CHEWED TOBACCO FOR 40 YRS  . Alcohol use No  . Drug use: No  . Sexual activity: Not Currently   Other Topics Concern  . Not on file   Social History Narrative   Has living will   Requests wife as health care POA.   Would accept resuscitation attempts   Not sure about tube feeds      Review of Systems No residual problems from stroke---feels a little "clumsy" No falls but close--discussed cane (he doesn't  feel he needs) Appetite is good Weight is stable Sleeps well    Objective:   Physical Exam  Constitutional: He appears well-nourished. No distress.  Cardiovascular: Normal rate, regular rhythm, normal heart sounds and intact distal pulses.  Exam reveals no gallop.   No murmur heard. Pulmonary/Chest: Effort normal and breath sounds normal. No respiratory distress. He has no wheezes. He has no rales.  Musculoskeletal: He exhibits no edema or tenderness.  Skin:  No foot lesions  Psychiatric: He has a normal mood and affect. His behavior is normal.          Assessment & Plan:

## 2016-12-08 DIAGNOSIS — R972 Elevated prostate specific antigen [PSA]: Secondary | ICD-10-CM | POA: Diagnosis not present

## 2016-12-15 DIAGNOSIS — R350 Frequency of micturition: Secondary | ICD-10-CM | POA: Diagnosis not present

## 2016-12-15 DIAGNOSIS — N401 Enlarged prostate with lower urinary tract symptoms: Secondary | ICD-10-CM | POA: Diagnosis not present

## 2016-12-15 DIAGNOSIS — R972 Elevated prostate specific antigen [PSA]: Secondary | ICD-10-CM | POA: Diagnosis not present

## 2016-12-25 DIAGNOSIS — H524 Presbyopia: Secondary | ICD-10-CM | POA: Diagnosis not present

## 2016-12-25 DIAGNOSIS — H25012 Cortical age-related cataract, left eye: Secondary | ICD-10-CM | POA: Diagnosis not present

## 2016-12-25 DIAGNOSIS — H43813 Vitreous degeneration, bilateral: Secondary | ICD-10-CM | POA: Diagnosis not present

## 2016-12-25 DIAGNOSIS — H52202 Unspecified astigmatism, left eye: Secondary | ICD-10-CM | POA: Diagnosis not present

## 2016-12-25 LAB — HM DIABETES EYE EXAM

## 2016-12-28 ENCOUNTER — Encounter: Payer: Self-pay | Admitting: Internal Medicine

## 2017-02-08 ENCOUNTER — Other Ambulatory Visit: Payer: Self-pay | Admitting: Internal Medicine

## 2017-02-27 DIAGNOSIS — H52202 Unspecified astigmatism, left eye: Secondary | ICD-10-CM | POA: Diagnosis not present

## 2017-02-27 DIAGNOSIS — E11319 Type 2 diabetes mellitus with unspecified diabetic retinopathy without macular edema: Secondary | ICD-10-CM | POA: Diagnosis not present

## 2017-02-27 DIAGNOSIS — H25812 Combined forms of age-related cataract, left eye: Secondary | ICD-10-CM | POA: Diagnosis not present

## 2017-02-27 DIAGNOSIS — H268 Other specified cataract: Secondary | ICD-10-CM | POA: Diagnosis not present

## 2017-03-13 DIAGNOSIS — E113293 Type 2 diabetes mellitus with mild nonproliferative diabetic retinopathy without macular edema, bilateral: Secondary | ICD-10-CM | POA: Diagnosis not present

## 2017-03-13 DIAGNOSIS — H43813 Vitreous degeneration, bilateral: Secondary | ICD-10-CM | POA: Diagnosis not present

## 2017-03-13 LAB — HM DIABETES EYE EXAM

## 2017-03-19 DIAGNOSIS — R69 Illness, unspecified: Secondary | ICD-10-CM | POA: Diagnosis not present

## 2017-03-21 ENCOUNTER — Encounter: Payer: Self-pay | Admitting: Internal Medicine

## 2017-03-30 DIAGNOSIS — Z7984 Long term (current) use of oral hypoglycemic drugs: Secondary | ICD-10-CM | POA: Diagnosis not present

## 2017-03-30 DIAGNOSIS — E785 Hyperlipidemia, unspecified: Secondary | ICD-10-CM | POA: Diagnosis not present

## 2017-03-30 DIAGNOSIS — E119 Type 2 diabetes mellitus without complications: Secondary | ICD-10-CM | POA: Diagnosis not present

## 2017-03-30 DIAGNOSIS — Z8249 Family history of ischemic heart disease and other diseases of the circulatory system: Secondary | ICD-10-CM | POA: Diagnosis not present

## 2017-03-30 DIAGNOSIS — Z7982 Long term (current) use of aspirin: Secondary | ICD-10-CM | POA: Diagnosis not present

## 2017-03-30 DIAGNOSIS — Z823 Family history of stroke: Secondary | ICD-10-CM | POA: Diagnosis not present

## 2017-03-30 DIAGNOSIS — Z833 Family history of diabetes mellitus: Secondary | ICD-10-CM | POA: Diagnosis not present

## 2017-03-30 DIAGNOSIS — Z8673 Personal history of transient ischemic attack (TIA), and cerebral infarction without residual deficits: Secondary | ICD-10-CM | POA: Diagnosis not present

## 2017-03-30 DIAGNOSIS — Z803 Family history of malignant neoplasm of breast: Secondary | ICD-10-CM | POA: Diagnosis not present

## 2017-03-30 DIAGNOSIS — I10 Essential (primary) hypertension: Secondary | ICD-10-CM | POA: Diagnosis not present

## 2017-05-09 ENCOUNTER — Ambulatory Visit (INDEPENDENT_AMBULATORY_CARE_PROVIDER_SITE_OTHER): Payer: Medicare HMO | Admitting: Internal Medicine

## 2017-05-09 ENCOUNTER — Encounter: Payer: Self-pay | Admitting: Internal Medicine

## 2017-05-09 VITALS — BP 140/88 | HR 72 | Temp 97.8°F | Ht 68.0 in | Wt 168.0 lb

## 2017-05-09 DIAGNOSIS — E1121 Type 2 diabetes mellitus with diabetic nephropathy: Secondary | ICD-10-CM

## 2017-05-09 DIAGNOSIS — Z0000 Encounter for general adult medical examination without abnormal findings: Secondary | ICD-10-CM

## 2017-05-09 DIAGNOSIS — Z7189 Other specified counseling: Secondary | ICD-10-CM

## 2017-05-09 DIAGNOSIS — I471 Supraventricular tachycardia: Secondary | ICD-10-CM | POA: Diagnosis not present

## 2017-05-09 DIAGNOSIS — N183 Chronic kidney disease, stage 3 (moderate): Secondary | ICD-10-CM | POA: Diagnosis not present

## 2017-05-09 DIAGNOSIS — D369 Benign neoplasm, unspecified site: Secondary | ICD-10-CM

## 2017-05-09 DIAGNOSIS — I6930 Unspecified sequelae of cerebral infarction: Secondary | ICD-10-CM

## 2017-05-09 DIAGNOSIS — E1122 Type 2 diabetes mellitus with diabetic chronic kidney disease: Secondary | ICD-10-CM | POA: Diagnosis not present

## 2017-05-09 DIAGNOSIS — N4231 Prostatic intraepithelial neoplasia: Secondary | ICD-10-CM | POA: Diagnosis not present

## 2017-05-09 LAB — CBC
HCT: 42.6 % (ref 39.0–52.0)
Hemoglobin: 14.6 g/dL (ref 13.0–17.0)
MCHC: 34.2 g/dL (ref 30.0–36.0)
MCV: 90.2 fl (ref 78.0–100.0)
Platelets: 242 10*3/uL (ref 150.0–400.0)
RBC: 4.72 Mil/uL (ref 4.22–5.81)
RDW: 13.6 % (ref 11.5–15.5)
WBC: 6.9 10*3/uL (ref 4.0–10.5)

## 2017-05-09 LAB — LIPID PANEL
Cholesterol: 176 mg/dL (ref 0–200)
HDL: 55.2 mg/dL (ref >39.00)
LDL Cholesterol: 86 mg/dL (ref 0–99)
NonHDL: 121.14
Total CHOL/HDL Ratio: 3
Triglycerides: 178 mg/dL — ABNORMAL HIGH (ref 0.0–149.0)
VLDL: 35.6 mg/dL (ref 0.0–40.0)

## 2017-05-09 LAB — COMPREHENSIVE METABOLIC PANEL
ALK PHOS: 50 U/L (ref 39–117)
ALT: 22 U/L (ref 0–53)
AST: 18 U/L (ref 0–37)
Albumin: 5.1 g/dL (ref 3.5–5.2)
BUN: 31 mg/dL — ABNORMAL HIGH (ref 6–23)
CO2: 23 meq/L (ref 19–32)
CREATININE: 1.34 mg/dL (ref 0.40–1.50)
Calcium: 11.1 mg/dL — ABNORMAL HIGH (ref 8.4–10.5)
Chloride: 109 mEq/L (ref 96–112)
GFR: 55.02 mL/min — ABNORMAL LOW (ref >60.00)
GLUCOSE: 159 mg/dL — AB (ref 70–99)
Potassium: 5 mEq/L (ref 3.5–5.1)
Sodium: 145 mEq/L (ref 135–145)
TOTAL PROTEIN: 7.5 g/dL (ref 6.0–8.3)
Total Bilirubin: 0.4 mg/dL (ref 0.2–1.2)

## 2017-05-09 LAB — HEMOGLOBIN A1C: HEMOGLOBIN A1C: 7 % — AB (ref 4.6–6.5)

## 2017-05-09 LAB — VITAMIN D 25 HYDROXY (VIT D DEFICIENCY, FRACTURES): VITD: 19.01 ng/mL — AB (ref 30.00–100.00)

## 2017-05-09 NOTE — Assessment & Plan Note (Signed)
See social history 

## 2017-05-09 NOTE — Assessment & Plan Note (Signed)
Mild balance and cognitive issues are stable Will decrease the asa dose

## 2017-05-09 NOTE — Assessment & Plan Note (Signed)
Follows with Dr Alinda Money

## 2017-05-09 NOTE — Progress Notes (Signed)
Hearing Screening   Method: Audiometry   125Hz  250Hz  500Hz  1000Hz  2000Hz  3000Hz  4000Hz  6000Hz  8000Hz   Right ear:   40 40 25  0    Left ear:   40 20 20  0    Vision Screening Comments: January 2019

## 2017-05-09 NOTE — Assessment & Plan Note (Signed)
Hopefully still reasonable control Will monitor labs

## 2017-05-09 NOTE — Progress Notes (Signed)
Subjective:    Patient ID: Tony Jackson., male    DOB: May 03, 1940, 77 y.o.   MRN: [id number]  HPI Here for Medicare wellness visit and follow up of chronic health conditions Reviewed form and advanced directives Reviewed other doctors No alcohol or tobacco Hasn't been exercising--discussed Vision is okay--had cataract surgery a couple of months ago [employer] [place] once on job---lost balance. No injury Now has retired No depression or anhedonia Mild memory issues without change Independent with instrumental ADLs  Checks sugars occasionally As high as 166---usually 130's or lower Tries to watch his eating--variable effectiveness No apparent renal issues or symptoms  No major residual from stroke Feels he doesn't walk right--but doesn't need cane Mild cognitive issues persist Still has implanted heart monitor--hasn't checked it  No palpitations or heart racing No chest pain No SOB No dizziness or syncope No edema  Keeps up with Dr Alinda Money Monitors PSA but no additional biopsy Slow urinary flow---but nothing worrisome Has some urinary frequency  Current Outpatient Medications on File Prior to Visit  Medication Sig Dispense Refill  . amLODipine (NORVASC) 10 MG tablet TAKE 1 TABLET BY MOUTH EVERY DAY 90 tablet 3  . aspirin EC 325 MG tablet Take 1 tablet (325 mg total) by mouth daily. 30 tablet 0  . glipiZIDE (GLUCOTROL XL) 5 MG 24 hr tablet TAKE 1 TABLET BY MOUTH EVERY DAY WITH BREAKFAST 90 tablet 3  . glucose blood (ONETOUCH VERIO) test strip Use 1 strip daily to check blood sugar Dx Code E11.21 100 each 3  . ketoconazole (NIZORAL) 2 % shampoo APPLY 1 APPLICATION TOPICALLY 2 (TWO) TIMES A WEEK. 120 mL 2  . lisinopril (PRINIVIL,ZESTRIL) 40 MG tablet TAKE 1 TABLET BY MOUTH EVERY DAY 90 tablet 3  . metFORMIN (GLUCOPHAGE) 1000 MG tablet TAKE 1 TABLET BY MOUTH TWICE A DAY WITH A MEAL 180 tablet 3  . mometasone (ELOCON) 0.1 % cream Apply 1 application topically 2 (two) times daily as  needed (skin). 45 g 1  . ONETOUCH DELICA LANCETS FINE MISC Use 1 daily to obtain blood sample Dx Code E11.21 100 each 3  . simvastatin (ZOCOR) 40 MG tablet TAKE 1 TABLET BY MOUTH EVERY DAY AT BEDTIME 90 tablet 3  . triamcinolone cream (KENALOG) 0.1 % Apply 1 application topically 2 (two) times daily as needed (itching). 30 g 1   No current facility-administered medications on file prior to visit.     No Known Allergies  Past Medical History:  Diagnosis Date  . CVA (cerebral infarction)   . Diabetes mellitus   . ED (erectile dysfunction)   . Elevated PSA   . Frequency of urination   . History of nephrolithiasis   . Hyperlipidemia   . Hypertension   . Lung nodule   . Psoriasis   . PSVT (paroxysmal supraventricular tachycardia) ([place])     Past Surgical History:  Procedure Laterality Date  . CATARACT EXTRACTION W/ INTRAOCULAR LENS IMPLANT Right 03/30/12   Dr Kathrin Penner  . 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 NM MYOVIEW LTD     normal EF 56% 03/08  . PROSTATE BIOPSY  01/05/2012   Procedure: BIOPSY TRANSRECTAL ULTRASONIC PROSTATE (TUBP);  Surgeon: [name], MD;  Location: [facility];  Service: Urology;  Laterality: N/A;  . TEE WITHOUT CARDIOVERSION N/A 05/21/2014   Procedure: TRANSESOPHAGEAL ECHOCARDIOGRAM (TEE);  Surgeon: Jerline Pain, MD;  Location: [facility];  Service: Cardiovascular;  Laterality: N/A;  .  TONSILLECTOMY  1960    Family History  Problem Relation Age of Onset  . Cancer Mother        breast cancer  . Diabetes Mother   . Heart disease Neg Hx     Social History   Socioeconomic History  . Marital status: Married    Spouse name: Tony Jackson  . Number of children: 2  . Years of education: HS  . Highest education level: Not on file  Social Needs  . Financial resource strain: Not on file  . Food insecurity - worry: Not on file  . Food insecurity - inability: Not on file  . Transportation needs - medical: Not  on file  . Transportation needs - non-medical: Not on file  Occupational History  . Occupation: Architect- paving    Comment: Retired  Tobacco Use  . Smoking status: Former Smoker    Packs/day: 1.00    Years: 5.00    Pack years: 5.00    Types: Cigarettes  . Smokeless tobacco: Current User    Types: Chew  . Tobacco comment: QUIT SMOKING CIGARETTES 50 YRS AGO--  CHEWED TOBACCO FOR 40 YRS  Substance and Sexual Activity  . Alcohol use: No    Alcohol/week: 0.0 oz  . Drug use: No  . Sexual activity: Not Currently  Other Topics Concern  . Not on file  Social History Narrative   Has living will   Requests wife as health care POA.   Would accept resuscitation attempts   Not sure about tube feeds   Review of Systems Hears noise in ear when he moves his jaw--no pain with chewing Hearing is okay No tinnitus Weight is stable Wears seat belt Sleeps okay Teeth okay---keeps up with dentist (needed fillings) Some psoriasis on low back--uses cream occasionally. No dermatologist Occasional fecal incontinence---- no blood Some low back pain--careful with bending, etc. No other sig joint issues Occasional heartburn/reflux--no meds. No dysphagia Bruises easily    Objective:   Physical Exam  Constitutional: He is oriented to person, place, and time. He appears well-developed. No distress.  HENT:  Mouth/Throat: Oropharynx is clear and moist. No oropharyngeal exudate.  TMs/canals normal   Neck: No thyromegaly present.  Cardiovascular: Normal rate, regular rhythm, normal heart sounds and intact distal pulses. Exam reveals no gallop.  No murmur heard. Pulmonary/Chest: Effort normal and breath sounds normal. No respiratory distress. He has no wheezes. He has no rales.  Abdominal: Soft. There is no tenderness.  Musculoskeletal: He exhibits no edema or tenderness.  Lymphadenopathy:    He has no cervical adenopathy.  Neurological: He is alert and oriented to person, place, and time.    President--- "Milinda Pointer, Obama, Ford----Bush" [id number] Recall 3/3  Normal sensation in feet  Skin: No erythema.  No foot lesions  Psychiatric: He has a normal mood and affect. His behavior is normal.          Assessment & Plan:

## 2017-05-09 NOTE — Assessment & Plan Note (Signed)
On ACEI Will recheck labs Check PTH, vitamin D as well

## 2017-05-09 NOTE — Assessment & Plan Note (Signed)
No symptoms since ablation On beta blocker

## 2017-05-09 NOTE — Assessment & Plan Note (Signed)
I have personally reviewed the Medicare Annual Wellness questionnaire and have noted 1. The patient's medical and social history 2. Their use of alcohol, tobacco or illicit drugs 3. Their current medications and supplements 4. The patient's functional ability including ADL's, fall risks, home safety risks and hearing or visual             impairment. 5. Diet and physical activities 6. Evidence for depression or mood disorders  The patients weight, height, BMI and visual acuity have been recorded in the chart I have made referrals, counseling and provided education to the patient based review of the above and I have provided the pt with a written personalized care plan for preventive services.  I have provided you with a copy of your personalized plan for preventive services. Please take the time to review along with your updated medication list.  Yearly flu vaccine Overdue for colon---will make referral Discussed exercise Consider shingrix

## 2017-05-10 ENCOUNTER — Other Ambulatory Visit: Payer: Self-pay | Admitting: Internal Medicine

## 2017-05-10 ENCOUNTER — Encounter: Payer: Self-pay | Admitting: Gastroenterology

## 2017-05-10 DIAGNOSIS — E213 Hyperparathyroidism, unspecified: Secondary | ICD-10-CM

## 2017-05-10 LAB — PARATHYROID HORMONE, INTACT (NO CA): PTH: 103 pg/mL — ABNORMAL HIGH (ref 14–64)

## 2017-05-10 NOTE — Progress Notes (Signed)
ndo re

## 2017-06-18 ENCOUNTER — Encounter: Payer: Self-pay | Admitting: Internal Medicine

## 2017-06-18 ENCOUNTER — Ambulatory Visit (INDEPENDENT_AMBULATORY_CARE_PROVIDER_SITE_OTHER): Payer: Medicare HMO | Admitting: Internal Medicine

## 2017-06-18 VITALS — BP 136/74 | HR 60 | Ht 68.0 in | Wt 168.6 lb

## 2017-06-18 DIAGNOSIS — E213 Hyperparathyroidism, unspecified: Secondary | ICD-10-CM | POA: Diagnosis not present

## 2017-06-18 DIAGNOSIS — E559 Vitamin D deficiency, unspecified: Secondary | ICD-10-CM

## 2017-06-18 NOTE — Patient Instructions (Signed)
Please start 4000 units vitamin D3 daily.  Please come back for labs in 2-2.5 months.  Please come back for a follow-up appointment in 4 months.  Hypercalcemia Hypercalcemia is having too much calcium in the blood. The body needs calcium to make bones and keep them strong. Calcium also helps the muscles, nerves, brain, and heart work the way they should. Most of the calcium in the body is in the bones. There is also some calcium in the blood. Hypercalcemia can happen when calcium comes out of the bones, or when the kidneys are not able to remove calcium from the blood. Hypercalcemia can be mild or severe. What are the causes? There are many possible causes of hypercalcemia. Common causes include:  Hyperparathyroidism. This is a condition in which the body produces too much parathyroid hormone. There are four parathyroid glands in your neck. These glands produce a chemical messenger (hormone) that helps the body absorb calcium from foods and helps your bones release calcium.  Certain kinds of cancer, such as lung cancer, breast cancer, or myeloma.  Less common causes of hypercalcemia include:  Getting too much calcium or vitamin D from your diet.  Kidney failure.  Hyperthyroidism.  Being on bed rest for a long time.  Certain medicines.  Infections.  Sarcoidosis.  What increases the risk? This condition is more likely to develop in:  Women.  People who are 60 years or older.  People who have a family history of hypercalcemia.  What are the signs or symptoms? Mild hypercalcemia that starts slowly may not cause symptoms. Severe, sudden hypercalcemia is more likely to cause symptoms, such as:  Loss of appetite.  Increased thirst and frequent urination.  Fatigue.  Nausea and vomiting.  Headache.  Abdominal pain.  Muscle pain, twitching, or weakness.  Constipation.  Blood in the urine.  Pain in the side of the back (flank pain).  Anxiety, confusion, or  depression.  Irregular heartbeat (arrhythmia).  Loss of consciousness.  How is this diagnosed? This condition may be diagnosed based on:  Your symptoms.  Blood tests.  Urine tests.  X-rays.  Ultrasound.  MRI.  CT scan.  How is this treated? Treatment for hypercalcemia depends on the cause. Treatment may include:  Receiving fluids through an IV tube.  Medicines that keep calcium levels steady after receiving fluids (loop diuretics).  Medicines that keep calcium in your bones (bisphosphonates).  Medicines that lower the calcium level in your blood.  Surgery to remove overactive parathyroid glands.  Follow these instructions at home:  Take over-the-counter and prescription medicines only as told by your health care provider.  Follow instructions from your health care provider about eating or drinking restrictions.  Drink enough fluid to keep your urine clear or pale yellow.  Stay active. Weight-bearing exercise helps to keep calcium in your bones. Follow instructions from your health care provider about what type and level of exercise is safe for you.  Keep all follow-up visits as told by your health care provider. This is important. Contact a health care provider if:  You have a fever.  You have flank or abdominal pain that is getting worse. Get help right away if:  You have severe abdominal or flank pain.  You have chest pain.  You have trouble breathing.  You become very confused and sleepy.  You lose consciousness. This information is not intended to replace advice given to you by your health care provider. Make sure you discuss any questions you have with your  health care provider. Document Released: 04/22/2004 Document Revised: 07/15/2015 Document Reviewed: 06/24/2014 Elsevier Interactive Patient Education  2018 [employer].

## 2017-06-18 NOTE — Progress Notes (Addendum)
Patient ID: Tony Hark., male   DOB: 1941-01-15, 77 y.o.   MRN: [id number]    HPI  Tony Jackson. is a 77 y.o.-year-old male, referred by his PCP, Dr. Silvio Pate, for evaluation for hypercalcemia/hyperparathyroidism.  He is here with his wife who offers part of the history, especially regarding his medical history and recent labs.  Pt was dx with hypercalcemia in 2017 per review of the chart, as patient is not aware of this problem. I reviewed pt's pertinent labs: Lab Results  Component Value Date   PTH 103 (H) 05/09/2017   CALCIUM 11.1 (H) 05/09/2017   CALCIUM 10.0 05/17/2016   CALCIUM 10.4 02/18/2016   CALCIUM 10.7 (H) 12/21/2015   CALCIUM 10.5 07/28/2015   CALCIUM 10.1 01/27/2015   CALCIUM 10.1 09/21/2014   CALCIUM 9.7 07/31/2014   CALCIUM 10.0 07/17/2014   CALCIUM 9.2 05/20/2014   08/04/2016: Magnesium 2.3, phosphorus 3.9, both normal; calcium 9.2  No history of osteoporosis.  No DXA scan is available for review. No fractures. H/o few falls (stumbles  -had a stroke ~2015)  + h/o kidney stones - 1x episode ~2014.  + h/o CKD 2/2 DM. Last BUN/Cr: Lab Results  Component Value Date   BUN 31 (H) 05/09/2017   BUN 28 (H) 05/17/2016   CREATININE 1.34 05/09/2017   CREATININE 1.32 05/17/2016   Pt has a history of hypertension, but s not on HCTZ.  + h/o vitamin D deficiency. Reviewed vit D levels: Lab Results  Component Value Date   VD25OH 19.01 (L) 05/09/2017   Pt is not on calcium and vitamin D.  Pt does not have a FH of hypercalcemia, pituitary tumors, thyroid cancer, or osteoporosis.   Pt. also has a history of DM2, HTN, HL.  Last HbA1c: Lab Results  Component Value Date   HGBA1C 7.0 (H) 05/09/2017   ROS: Constitutional: no weight gain/loss, + fatigue, no subjective hyperthermia/hypothermia, + nocturia Eyes: no blurry vision, no xerophthalmia ENT: no sore throat, no nodules palpated in throat, no dysphagia/odynophagia, no hoarseness Cardiovascular: no  CP/SOB/palpitations/leg swelling Respiratory: no cough/SOB Gastrointestinal: no N/V/D/C Musculoskeletal: no muscle/joint aches Skin: + rash Neurological: no tremors/numbness/tingling/dizziness Psychiatric: no depression/anxiety  + difficulty with erections , + Low libido  Past Medical History:  Diagnosis Date  . CVA (cerebral infarction)   . Diabetes mellitus   . ED (erectile dysfunction)   . Elevated PSA   . Frequency of urination   . History of nephrolithiasis   . Hyperlipidemia   . Hypertension   . Lung nodule   . Psoriasis   . PSVT (paroxysmal supraventricular tachycardia) ([place])    Past Surgical History:  Procedure Laterality Date  . CATARACT EXTRACTION W/ INTRAOCULAR LENS IMPLANT Right 03/30/12   Dr Kathrin Penner  . 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 NM MYOVIEW LTD     normal EF 56% 03/08  . PROSTATE BIOPSY  01/05/2012   Procedure: BIOPSY TRANSRECTAL ULTRASONIC PROSTATE (TUBP);  Surgeon: [name], MD;  Location: [facility];  Service: Urology;  Laterality: N/A;  . TEE WITHOUT CARDIOVERSION N/A 05/21/2014   Procedure: TRANSESOPHAGEAL ECHOCARDIOGRAM (TEE);  Surgeon: Jerline Pain, MD;  Location: [place];  Service: Cardiovascular;  Laterality: N/A;  . TONSILLECTOMY  1960   Social History   Socioeconomic History  . Marital status: Married    Spouse name: Tony Jackson  . Number of children: 2  . Years of education: HS  . Highest education level:  Not on file  Occupational History  . 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 Financial resource strain: Not on file  . Food insecurity:    Worry: Not on file    Inability: Not on file  . Transportation needs:    Medical: Not on file    Non-medical: Not on file  Tobacco Use  . Smoking status: Former Smoker    Packs/day: 1.00    Years: 5.00    Pack years: 5.00    Types: Cigarettes  . Smokeless tobacco: Current User    Types: Chew  .  Tobacco comment: QUIT SMOKING CIGARETTES 50 YRS AGO--  CHEWED TOBACCO FOR 40 YRS  Substance and Sexual Activity  . Alcohol use: No    Alcohol/week: 0.0 oz  . Drug use: No  . Sexual activity: Not Currently  Lifestyle  . Physical activity:    Days per week: Not on file    Minutes per session: Not on file  . Stress: Not on file  Relationships  . Social connections:    Talks on phone: Not on file    Gets together: Not on file    Attends religious service: Not on file    Active member of club or organization: Not on file    Attends meetings of clubs or organizations: Not on file    Relationship status: Not on file  . Intimate partner violence:    Fear of current or ex partner: Not on file    Emotionally abused: Not on file    Physically abused: Not on file    Forced sexual activity: Not on file  Other Topics Concern  . Not on file  Social History Narrative   Has living will   Requests wife as health care POA.   Would accept resuscitation attempts   Not sure about tube feeds   Current Outpatient Medications on File Prior to Visit  Medication Sig Dispense Refill  . amLODipine (NORVASC) 10 MG tablet TAKE 1 TABLET BY MOUTH EVERY DAY 90 tablet 3  . aspirin EC 81 MG tablet Take 81 mg by mouth daily.    Marland Kitchen glipiZIDE (GLUCOTROL XL) 5 MG 24 hr tablet TAKE 1 TABLET BY MOUTH EVERY DAY WITH BREAKFAST 90 tablet 3  . glucose blood (ONETOUCH VERIO) test strip Use 1 strip daily to check blood sugar Dx Code E11.21 100 each 3  . ketoconazole (NIZORAL) 2 % shampoo APPLY 1 APPLICATION TOPICALLY 2 (TWO) TIMES A WEEK. 120 mL 2  . lisinopril (PRINIVIL,ZESTRIL) 40 MG tablet TAKE 1 TABLET BY MOUTH EVERY DAY 90 tablet 3  . metFORMIN (GLUCOPHAGE) 1000 MG tablet TAKE 1 TABLET BY MOUTH TWICE A DAY WITH A MEAL 180 tablet 3  . mometasone (ELOCON) 0.1 % cream Apply 1 application topically 2 (two) times daily as needed (skin). 45 g 1  . ONETOUCH DELICA LANCETS FINE MISC Use 1 daily to obtain blood sample Dx Code  E11.21 100 each 3  . simvastatin (ZOCOR) 40 MG tablet TAKE 1 TABLET BY MOUTH EVERY DAY AT BEDTIME 90 tablet 3  . triamcinolone cream (KENALOG) 0.1 % Apply 1 application topically 2 (two) times daily as needed (itching). 30 g 1   No current facility-administered medications on file prior to visit.    No Known Allergies Family History  Problem Relation Age of Onset  . Cancer Mother        breast cancer  . Diabetes Mother   . Heart disease  Neg Hx     PE: BP 136/74   Pulse 60   Ht 5\' 8"  (1.727 m)   Wt 168 lb 9.6 oz (76.5 kg)   SpO2 98%   BMI 25.64 kg/m  Wt Readings from Last 3 Encounters:  06/18/17 168 lb 9.6 oz (76.5 kg)  05/09/17 168 lb (76.2 kg)  11/17/16 167 lb (75.8 kg)   Constitutional: Normal weight, in NAD. No kyphosis. Eyes: PERRLA, EOMI, no exophthalmos ENT: moist mucous membranes, no thyromegaly, no cervical lymphadenopathy Cardiovascular: RRR, No MRG Respiratory: CTA B Gastrointestinal: abdomen soft, NT, ND, BS+ Musculoskeletal: no deformities, strength intact in all 4 Skin: moist, warm, no rashes Neurological: no tremor with outstretched hands, DTR normal in all 4  Assessment: 1. Hypercalcemia/hyperparathyroidism  2. Vitamin D def  Plan: Patient has had 2 instances of elevated calcium, with the highest level being at 11.1 in 04/2017. A corresponding intact PTH level was also high, at 103.  However, at the same time, his vitamin D was very low, at 13. - Regarding possible complications from hypercalcemia: no h/o osteoporosis, no fractures; but he does have a h/o nephrolithiasis x1 episode. No abdominal pain, depression, bone pain. - I discussed with the patient about the physiology of calcium and parathyroid hormone, and possible side effects from increased PTH, including the above symptoms. - We discussed that we need to check whether his hyperparathyroidism is primary (Familial hypercalcemic hypocalciuria or parathyroid adenoma) or secondary (to conditions  like: vitamin D deficiency, calcium malabsorption, hypercalciuria, renal insufficiency, etc.). - I discussed with him that we first need to bring his vitamin D level to normal so we can further investigate the parathyroid status. I explained that in the setting of a low vitamin D, the parathyroid hormone can be elevated, which is not a pathologic finding. However, if the PTH is elevated in the setting of a normal vitamin D, we will further need to investigate him for primary or secondary hyperparathyroidism. - after we normalize the vitamin D level, we'll need to check: calcium level intact PTH ([facility]) Magnesium Phosphorus vitamin D- 25 HO and 1,25 HO Possibly even 24h urinary calcium/creatinine ratio -  discussed about instructions for urine collection - We discussed possible consequences of hyperparathyroidism: ~1/3 pts will develop complications over 15 years (OP, nephrolithiasis).  - If the tests indicate a parathyroid adenoma,  we discussed about possible surgery.  - criteria for parathyroid surgery:  Increased calcium by more than 1 mg/dL above the upper limit of normal  Kidney ds.  Osteoporosis (or Vb fx) Age <101 years old Newer criteria (2013): High UCa >400 mg/d and increased stone risk by biochemical stone risk analysis Presence of nephrolithiasis or nephrocalcinosis Pt's preference - We may need to check a DXA scan to see if he has osteoporosis (+ add a 33% distal radius for evaluation of cortical bone, which is predominantly affected by hyperparathyroidism).  - I will advise him about vitamin D supplement dose when the results of the vitamin D level are back. - I will see the patient back in 4 months  2. Vitamin D deficiency  - We reviewed together with patient and his wife his latest vitamin D level, which was quite low, at 65 - We will start 4000 units vitamin D daily  - We will have him back for repeat vitamin D level in 2 to 2-1/2 months  Orders Placed This Encounter   Procedures  . Parathyroid hormone, intact (no Ca)  . VITAMIN D 25 Hydroxy (Vit-D  Deficiency, Fractures)  . Vitamin D 1,25 dihydroxy  . BASIC METABOLIC PANEL WITH GFR   Component     Latest Ref Rng & Units 08/16/2017  Glucose     65 - 99 mg/dL 152 (H)  BUN     7 - 25 mg/dL 28 (H)  Creatinine     0.70 - 1.18 mg/dL 1.57 (H)  GFR, Est Non African American     > OR = 60 mL/min/1.40m2 42 (L)  GFR, Est African American     > OR = 60 mL/min/1.52m2 49 (L)  BUN/Creatinine Ratio     6 - 22 (calc) 18  Sodium     135 - 146 mmol/L 140  Potassium     3.5 - 5.3 mmol/L 4.7  Chloride     98 - 110 mmol/L 107  CO2     20 - 32 mmol/L 23  Calcium     8.6 - 10.3 mg/dL 10.1  Vitamin D 1, 25 (OH) Total     18 - 72 pg/mL 37  Vitamin D3 1, 25 (OH)     pg/mL 37  Vitamin D2 1, 25 (OH)     pg/mL <8  PTH, Intact     15 - 65 pg/mL 61  VITD     30.00 - 100.00 ng/mL 39.93   Ca level and PTH both normal after normalization of his vitamin D. Continue current vit D dose. Would not intervene for now, but repeat the labs at next visit. GFR lower >> will FWD results to PCP. Will advise him to stay hydrated.  Glu also slightly high.  Philemon Kingdom, MD PhD [facility] Endocrinology

## 2017-06-20 DIAGNOSIS — R972 Elevated prostate specific antigen [PSA]: Secondary | ICD-10-CM | POA: Diagnosis not present

## 2017-06-21 ENCOUNTER — Ambulatory Visit (AMBULATORY_SURGERY_CENTER): Payer: Self-pay | Admitting: *Deleted

## 2017-06-21 ENCOUNTER — Other Ambulatory Visit: Payer: Self-pay

## 2017-06-21 ENCOUNTER — Telehealth: Payer: Self-pay | Admitting: Gastroenterology

## 2017-06-21 VITALS — Ht 68.0 in | Wt 170.8 lb

## 2017-06-21 DIAGNOSIS — Z86010 Personal history of colonic polyps: Secondary | ICD-10-CM

## 2017-06-21 MED ORDER — PEG-KCL-NACL-NASULF-NA ASC-C 140 G PO SOLR: 1.0000 | ORAL | 0 refills | Status: DC

## 2017-06-21 NOTE — Progress Notes (Signed)
No egg or soy allergy known to patient   no intubation problems - Bp dropped after prostate procedure in hospital.  Patient was hospitalized overnight No diet pills per patient No home 02 use per patient  No blood thinners per patient  Pt denies issues with constipation on and off eats prunes  A fib  Had ablation or A flutter  EMMI video sent to pt's e mail pt. Declined  Sample of Plenvu given Lot [id number]  Exp. 07/2018

## 2017-06-21 NOTE — Telephone Encounter (Signed)
Called  [facility]--  Cancelled [facility] sent to [facility]- called home number- got answer machine that identifies pt by name -- instructed we cancelled prep since he got sample  [facility]

## 2017-06-22 ENCOUNTER — Encounter: Payer: Self-pay | Admitting: Gastroenterology

## 2017-06-27 DIAGNOSIS — R350 Frequency of micturition: Secondary | ICD-10-CM | POA: Diagnosis not present

## 2017-06-27 DIAGNOSIS — R972 Elevated prostate specific antigen [PSA]: Secondary | ICD-10-CM | POA: Diagnosis not present

## 2017-06-27 DIAGNOSIS — N401 Enlarged prostate with lower urinary tract symptoms: Secondary | ICD-10-CM | POA: Diagnosis not present

## 2017-06-29 ENCOUNTER — Other Ambulatory Visit: Payer: Self-pay | Admitting: Urology

## 2017-06-29 DIAGNOSIS — R972 Elevated prostate specific antigen [PSA]: Secondary | ICD-10-CM

## 2017-07-06 ENCOUNTER — Other Ambulatory Visit: Payer: Self-pay

## 2017-07-06 ENCOUNTER — Ambulatory Visit (AMBULATORY_SURGERY_CENTER): Payer: Medicare HMO | Admitting: Gastroenterology

## 2017-07-06 ENCOUNTER — Encounter: Payer: Self-pay | Admitting: Gastroenterology

## 2017-07-06 VITALS — BP 131/79 | HR 61 | Temp 98.6°F | Resp 10 | Ht 68.0 in | Wt 168.0 lb

## 2017-07-06 DIAGNOSIS — D123 Benign neoplasm of transverse colon: Secondary | ICD-10-CM | POA: Diagnosis not present

## 2017-07-06 DIAGNOSIS — Z86010 Personal history of colonic polyps: Secondary | ICD-10-CM | POA: Diagnosis not present

## 2017-07-06 DIAGNOSIS — D122 Benign neoplasm of ascending colon: Secondary | ICD-10-CM | POA: Diagnosis not present

## 2017-07-06 DIAGNOSIS — Z1211 Encounter for screening for malignant neoplasm of colon: Secondary | ICD-10-CM | POA: Diagnosis not present

## 2017-07-06 MED ORDER — SODIUM CHLORIDE 0.9 % IV SOLN: 500.0000 mL | Freq: Once | INTRAVENOUS | Status: DC

## 2017-07-06 NOTE — Patient Instructions (Signed)
YOU HAD AN ENDOSCOPIC PROCEDURE TODAY AT [facility]:   Refer to the procedure report that was given to you for any specific questions about what was found during the examination.  If the procedure report does not answer your questions, please call your gastroenterologist to clarify.  If you requested that your care partner not be given the details of your procedure findings, then the procedure report has been included in a sealed envelope for you to review at your convenience later.  YOU SHOULD EXPECT: Some feelings of bloating in the abdomen. Passage of more gas than usual.  Walking can help get rid of the air that was put into your GI tract during the procedure and reduce the bloating. If you had a lower endoscopy (such as a colonoscopy or flexible sigmoidoscopy) you may notice spotting of blood in your stool or on the toilet paper. If you underwent a bowel prep for your procedure, you may not have a normal bowel movement for a few days.  Please Note:  You might notice some irritation and congestion in your nose or some drainage.  This is from the oxygen used during your procedure.  There is no need for concern and it should clear up in a day or so.  SYMPTOMS TO REPORT IMMEDIATELY:   Following lower endoscopy (colonoscopy or flexible sigmoidoscopy):  Excessive amounts of blood in the stool  Significant tenderness or worsening of abdominal pains  Swelling of the abdomen that is new, acute  Fever of 100F or higher   For urgent or emergent issues, a gastroenterologist can be reached at any hour by calling [phone].   DIET:  We do recommend a small meal at first, but then you may proceed to your regular diet.  Drink plenty of fluids but you should avoid alcoholic beverages for 24 hours.  ACTIVITY:  You should plan to take it easy for the rest of today and you should NOT DRIVE or use heavy machinery until tomorrow (because of the sedation medicines used during the test).     FOLLOW UP: Our staff will call the number listed on your records the next business day following your procedure to check on you and address any questions or concerns that you may have regarding the information given to you following your procedure. If we do not reach you, we will leave a message.  However, if you are feeling well and you are not experiencing any problems, there is no need to return our call.  We will assume that you have returned to your regular daily activities without incident.  If any biopsies were taken you will be contacted by phone or by letter within the next 1-3 weeks.  Please call us at [phone] if you have not heard about the biopsies in 3 weeks.    SIGNATURES/CONFIDENTIALITY: You and/or your care partner have signed paperwork which will be entered into your electronic medical record.  These signatures attest to the fact that that the information above on your After Visit Summary has been reviewed and is understood.  Full responsibility of the confidentiality of this discharge information lies with you and/or your care-partner.  Polyp information given  Recall colonoscopy due in 6 months.  No aspirin, ibuprofen, naproxen, or other non steroidal anti inflammatory drugs for 7 days.

## 2017-07-06 NOTE — Op Note (Signed)
[facility] Patient Name: Tony Jackson Procedure Date: 07/06/2017 11:16 AM MRN: [id number] Endoscopist: Sterling. Loletha Carrow , MD Age: 77 Referring MD:  Date of Birth: 1940-12-14 Gender: Male Account #: [id number] Procedure:                Colonoscopy Indications:              Surveillance: Personal history of adenomatous                            polyps on last colonoscopy > 5 years ago (TA x 3,                            each under 44mm, 09/2009) Medicines:                Monitored Anesthesia Care Procedure:                Pre-Anesthesia Assessment:                           - Prior to the procedure, a History and Physical                            was performed, and patient medications and                            allergies were reviewed. The patient's tolerance of                            previous anesthesia was also reviewed. The risks                            and benefits of the procedure and the sedation                            options and risks were discussed with the patient.                            All questions were answered, and informed consent                            was obtained. Anticoagulants: The patient has taken                            aspirin. It was decided not to withhold this                            medication prior to the procedure. ASA Grade                            Assessment: III - A patient with severe systemic                            disease. After reviewing the risks and benefits,  the patient was deemed in satisfactory condition to                            undergo the procedure.                           After obtaining informed consent, the colonoscope                            was passed under direct vision. Throughout the                            procedure, the patient's blood pressure, pulse, and                            oxygen saturations were monitored continuously. The                             Model CF-HQ190L [id number] scope was introduced                            through the anus and advanced to the the cecum,                            identified by appendiceal orifice and ileocecal                            valve. The colonoscopy was performed without                            difficulty. The patient tolerated the procedure                            well. The quality of the bowel preparation was                            good. The ileocecal valve, appendiceal orifice, and                            rectum were photographed. The quality of the bowel                            preparation was evaluated using the BBPS [facility]                            Bowel Preparation Scale) with scores of: Right                            Colon = 2, Transverse Colon = 2 and Left Colon = 2.                            The total BBPS score equals 6. Scope In: 11:18:16 AM Scope Out: 11:59:25 AM Scope Withdrawal Time: 0 hours 38 minutes 36  seconds  Total Procedure Duration: 0 hours 41 minutes 9 seconds  Findings:                 The digital rectal exam findings include enlarged                            prostate and soft without palpable nodules.                           A 4 mm polyp was found in the ascending colon. The                            polyp was sessile. The polyp was removed with a                            cold snare. Resection and retrieval were complete.                           Two sessile polyps were found in the distal                            transverse colon. The polyps were 4 to 6 mm in                            size. These polyps were removed with a cold snare.                            Resection and retrieval were complete.                           A 25-30 mm polyp was found in the mid transverse                            colon. The polyp was multi-lobulated and sessile                            and overlapping a fold. Area was successfully                             injected with 15 mL saline for lesion assessment,                            and this injection appeared to lift the lesion                            adequately. The polyp was removed with a piecemeal                            technique using a hot and cold snare. Resection and                            retrieval appeared complete. Area 3-5cm distal to  polypectoy site was tattooed with an injection of 1                            mL of Spot (carbon black). A single hemoclip was                            placed on the site to decrease chance of                            post-polypectomy bleeding.                           The exam was otherwise without abnormality on                            direct and retroflexion views. Complications:            No immediate complications. Estimated Blood Loss:     Estimated blood loss was minimal. Impression:               - Enlarged prostate found on digital rectal exam.                           - One 4 mm polyp in the ascending colon, removed                            with a cold snare. Resected and retrieved.                           - Two 4 to 6 mm polyps in the distal transverse                            colon, removed with a cold snare. Resected and                            retrieved.                           - One 25-30 mm polyp in the mid transverse colon,                            removed piecemeal using a hot snare. Resected and                            retrieved. Injected. Tattooed. Clipped.                           - The examination was otherwise normal on direct                            and retroflexion views. Recommendation:           - Patient has a contact number available for  emergencies. The signs and symptoms of potential                            delayed complications were discussed with the                            patient. Return  to normal activities tomorrow.                            Written discharge instructions were provided to the                            patient.                           - Resume previous diet.                           - No aspirin, ibuprofen, naproxen, or other                            non-steroidal anti-inflammatory drugs for 7 days                            after polyp removal.                           - Await pathology results.                           - Repeat colonoscopy in 6 months for surveillance                            due to piecemeal removal. Henry L. Loletha Carrow, MD 07/06/2017 12:08:50 PM This report has been signed electronically.

## 2017-07-06 NOTE — Progress Notes (Signed)
A and O x3. Report to RN. Tolerated MAC anesthesia well.

## 2017-07-06 NOTE — Progress Notes (Signed)
Called to room to assist during endoscopic procedure.  Patient ID and intended procedure confirmed with present staff. Received instructions for my participation in the procedure from the performing physician.  

## 2017-07-09 ENCOUNTER — Telehealth: Payer: Self-pay

## 2017-07-09 NOTE — Telephone Encounter (Signed)
  Follow up Call-  Call back number 07/06/2017  Post procedure Call Back phone  # [phone]  Permission to leave phone message Yes  Some recent data might be hidden     Patient questions:  Do you have a fever, pain , or abdominal swelling? No. Pain Score  0 *  Have you tolerated food without any problems? Yes.    Have you been able to return to your normal activities? Yes.    Do you have any questions about your discharge instructions: Diet   No. Medications  No. Follow up visit  No.  Do you have questions or concerns about your Care? No.  Actions: * If pain score is 4 or above: No action needed, pain <4.

## 2017-07-09 NOTE — Telephone Encounter (Signed)
  Follow up Call-  Call back number 07/06/2017  Post procedure Call Back phone  # [phone]  Permission to leave phone message Yes  Some recent data might be hidden     Patient questions:  Do you have a fever, pain , or abdominal swelling? No. Pain Score  0 *  Have you tolerated food without any problems? Yes.    Have you been able to return to your normal activities? Yes.    Do you have any questions about your discharge instructions: Diet   No. Medications  No. Follow up visit  No.  Do you have questions or concerns about your Care? No.  Actions: * If pain score is 4 or above: No action needed, pain <4.

## 2017-07-17 ENCOUNTER — Other Ambulatory Visit: Payer: Medicare HMO

## 2017-07-17 ENCOUNTER — Ambulatory Visit
Admission: RE | Admit: 2017-07-17 | Discharge: 2017-07-17 | Disposition: A | Discharge status: Home / Self Care | Payer: Medicare HMO | Source: Ambulatory Visit | Attending: Urology | Admitting: Urology

## 2017-07-17 DIAGNOSIS — R972 Elevated prostate specific antigen [PSA]: Secondary | ICD-10-CM | POA: Diagnosis not present

## 2017-07-17 DIAGNOSIS — N401 Enlarged prostate with lower urinary tract symptoms: Secondary | ICD-10-CM | POA: Diagnosis not present

## 2017-07-17 MED ORDER — GADOBENATE DIMEGLUMINE 529 MG/ML IV SOLN
16.0000 mL | Freq: Once | INTRAVENOUS | Status: AC | PRN
  Administered 2017-07-17: 16 mL via INTRAVENOUS

## 2017-07-18 ENCOUNTER — Encounter: Payer: Self-pay | Admitting: Gastroenterology

## 2017-08-16 ENCOUNTER — Other Ambulatory Visit (INDEPENDENT_AMBULATORY_CARE_PROVIDER_SITE_OTHER): Payer: Medicare HMO

## 2017-08-16 DIAGNOSIS — E559 Vitamin D deficiency, unspecified: Secondary | ICD-10-CM | POA: Diagnosis not present

## 2017-08-16 DIAGNOSIS — E213 Hyperparathyroidism, unspecified: Secondary | ICD-10-CM | POA: Diagnosis not present

## 2017-08-16 LAB — VITAMIN D 25 HYDROXY (VIT D DEFICIENCY, FRACTURES): VITD: 39.93 ng/mL (ref 30.00–100.00)

## 2017-08-17 ENCOUNTER — Other Ambulatory Visit: Payer: Medicare HMO

## 2017-08-17 LAB — PARATHYROID HORMONE, INTACT (NO CA): PTH: 61 pg/mL (ref 15–65)

## 2017-08-19 LAB — BASIC METABOLIC PANEL WITH GFR
BUN / CREAT RATIO: 18 (calc) (ref 6–22)
BUN: 28 mg/dL — ABNORMAL HIGH (ref 7–25)
CHLORIDE: 107 mmol/L (ref 98–110)
CO2: 23 mmol/L (ref 20–32)
Calcium: 10.1 mg/dL (ref 8.6–10.3)
Creat: 1.57 mg/dL — ABNORMAL HIGH (ref 0.70–1.18)
GFR, Est African American: 49 mL/min/{1.73_m2} — ABNORMAL LOW (ref >=60)
GFR, Est Non African American: 42 mL/min/{1.73_m2} — ABNORMAL LOW (ref >=60)
GLUCOSE: 152 mg/dL — AB (ref 65–99)
Potassium: 4.7 mmol/L (ref 3.5–5.3)
SODIUM: 140 mmol/L (ref 135–146)

## 2017-08-19 LAB — VITAMIN D 1,25 DIHYDROXY
Vitamin D 1, 25 (OH)2 Total: 37 pg/mL (ref 18–72)
Vitamin D2 1, 25 (OH)2: <8 pg/mL
Vitamin D3 1, 25 (OH)2: 37 pg/mL

## 2017-08-20 DIAGNOSIS — I639 Cerebral infarction, unspecified: Secondary | ICD-10-CM

## 2017-08-20 HISTORY — DX: Cerebral infarction, unspecified: I63.9

## 2017-09-06 ENCOUNTER — Emergency Department (HOSPITAL_COMMUNITY): Payer: Medicare HMO

## 2017-09-06 ENCOUNTER — Encounter (HOSPITAL_COMMUNITY): Payer: Self-pay | Admitting: Emergency Medicine

## 2017-09-06 ENCOUNTER — Observation Stay (HOSPITAL_COMMUNITY)
Admission: EM | Admit: 2017-09-06 | Discharge: 2017-09-08 | Disposition: A | Discharge status: Home / Self Care | Payer: Medicare HMO | Attending: Internal Medicine | Admitting: Internal Medicine

## 2017-09-06 ENCOUNTER — Other Ambulatory Visit: Payer: Self-pay

## 2017-09-06 DIAGNOSIS — Z7982 Long term (current) use of aspirin: Secondary | ICD-10-CM | POA: Insufficient documentation

## 2017-09-06 DIAGNOSIS — N189 Chronic kidney disease, unspecified: Secondary | ICD-10-CM | POA: Diagnosis not present

## 2017-09-06 DIAGNOSIS — E1121 Type 2 diabetes mellitus with diabetic nephropathy: Secondary | ICD-10-CM | POA: Diagnosis not present

## 2017-09-06 DIAGNOSIS — E1122 Type 2 diabetes mellitus with diabetic chronic kidney disease: Secondary | ICD-10-CM | POA: Insufficient documentation

## 2017-09-06 DIAGNOSIS — E213 Hyperparathyroidism, unspecified: Secondary | ICD-10-CM | POA: Diagnosis present

## 2017-09-06 DIAGNOSIS — I129 Hypertensive chronic kidney disease with stage 1 through stage 4 chronic kidney disease, or unspecified chronic kidney disease: Secondary | ICD-10-CM | POA: Diagnosis not present

## 2017-09-06 DIAGNOSIS — H5340 Unspecified visual field defects: Principal | ICD-10-CM

## 2017-09-06 DIAGNOSIS — H57053 Tonic pupil, bilateral: Secondary | ICD-10-CM | POA: Diagnosis not present

## 2017-09-06 DIAGNOSIS — I639 Cerebral infarction, unspecified: Secondary | ICD-10-CM | POA: Diagnosis present

## 2017-09-06 DIAGNOSIS — N179 Acute kidney failure, unspecified: Secondary | ICD-10-CM | POA: Insufficient documentation

## 2017-09-06 DIAGNOSIS — I2510 Atherosclerotic heart disease of native coronary artery without angina pectoris: Secondary | ICD-10-CM | POA: Insufficient documentation

## 2017-09-06 DIAGNOSIS — E860 Dehydration: Secondary | ICD-10-CM

## 2017-09-06 DIAGNOSIS — E785 Hyperlipidemia, unspecified: Secondary | ICD-10-CM | POA: Diagnosis not present

## 2017-09-06 DIAGNOSIS — I6930 Unspecified sequelae of cerebral infarction: Secondary | ICD-10-CM

## 2017-09-06 DIAGNOSIS — N183 Chronic kidney disease, stage 3 (moderate): Secondary | ICD-10-CM | POA: Insufficient documentation

## 2017-09-06 DIAGNOSIS — E1129 Type 2 diabetes mellitus with other diabetic kidney complication: Secondary | ICD-10-CM | POA: Diagnosis present

## 2017-09-06 DIAGNOSIS — E113293 Type 2 diabetes mellitus with mild nonproliferative diabetic retinopathy without macular edema, bilateral: Secondary | ICD-10-CM | POA: Diagnosis not present

## 2017-09-06 DIAGNOSIS — Z8673 Personal history of transient ischemic attack (TIA), and cerebral infarction without residual deficits: Secondary | ICD-10-CM | POA: Diagnosis not present

## 2017-09-06 DIAGNOSIS — Z7984 Long term (current) use of oral hypoglycemic drugs: Secondary | ICD-10-CM | POA: Insufficient documentation

## 2017-09-06 DIAGNOSIS — Z79899 Other long term (current) drug therapy: Secondary | ICD-10-CM | POA: Insufficient documentation

## 2017-09-06 DIAGNOSIS — H539 Unspecified visual disturbance: Secondary | ICD-10-CM | POA: Diagnosis present

## 2017-09-06 DIAGNOSIS — I6523 Occlusion and stenosis of bilateral carotid arteries: Secondary | ICD-10-CM | POA: Diagnosis not present

## 2017-09-06 DIAGNOSIS — H53462 Homonymous bilateral field defects, left side: Secondary | ICD-10-CM | POA: Diagnosis not present

## 2017-09-06 DIAGNOSIS — R29818 Other symptoms and signs involving the nervous system: Secondary | ICD-10-CM | POA: Diagnosis not present

## 2017-09-06 DIAGNOSIS — I10 Essential (primary) hypertension: Secondary | ICD-10-CM | POA: Diagnosis present

## 2017-09-06 LAB — COMPREHENSIVE METABOLIC PANEL
ALT: 28 U/L (ref 0–44)
AST: 23 U/L (ref 15–41)
Albumin: 4.5 g/dL (ref 3.5–5.0)
Alkaline Phosphatase: 58 U/L (ref 38–126)
Anion gap: 10 (ref 5–15)
BUN: 37 mg/dL — AB (ref 8–23)
CHLORIDE: 111 mmol/L (ref 98–111)
CO2: 23 mmol/L (ref 22–32)
Calcium: 10.5 mg/dL — ABNORMAL HIGH (ref 8.9–10.3)
Creatinine, Ser: 1.71 mg/dL — ABNORMAL HIGH (ref 0.61–1.24)
GFR calc Af Amer: 43 mL/min — ABNORMAL LOW (ref >60)
GFR, EST NON AFRICAN AMERICAN: 37 mL/min — AB (ref >60)
Glucose, Bld: 124 mg/dL — ABNORMAL HIGH (ref 70–99)
POTASSIUM: 4.5 mmol/L (ref 3.5–5.1)
Sodium: 144 mmol/L (ref 135–145)
Total Bilirubin: 1.1 mg/dL (ref 0.3–1.2)
Total Protein: 7.2 g/dL (ref 6.5–8.1)

## 2017-09-06 LAB — CBC WITH DIFFERENTIAL/PLATELET
Abs Immature Granulocytes: 0 10*3/uL (ref 0.0–0.1)
BASOS ABS: 0.1 10*3/uL (ref 0.0–0.1)
BASOS PCT: 1 %
EOS ABS: 0.2 10*3/uL (ref 0.0–0.7)
Eosinophils Relative: 3 %
HCT: 43.2 % (ref 39.0–52.0)
Hemoglobin: 14.2 g/dL (ref 13.0–17.0)
IMMATURE GRANULOCYTES: 0 %
Lymphocytes Relative: 20 %
Lymphs Abs: 1.4 10*3/uL (ref 0.7–4.0)
MCH: 30.8 pg (ref 26.0–34.0)
MCHC: 32.9 g/dL (ref 30.0–36.0)
MCV: 93.7 fL (ref 78.0–100.0)
Monocytes Absolute: 0.8 10*3/uL (ref 0.1–1.0)
Monocytes Relative: 11 %
NEUTROS PCT: 65 %
Neutro Abs: 4.7 10*3/uL (ref 1.7–7.7)
PLATELETS: 234 10*3/uL (ref 150–400)
RBC: 4.61 MIL/uL (ref 4.22–5.81)
RDW: 13.1 % (ref 11.5–15.5)
WBC: 7.2 10*3/uL (ref 4.0–10.5)

## 2017-09-06 LAB — GLUCOSE, CAPILLARY: Glucose-Capillary: 183 mg/dL — ABNORMAL HIGH (ref 70–99)

## 2017-09-06 LAB — PROTIME-INR
INR: 1.01
PROTHROMBIN TIME: 13.2 s (ref 11.4–15.2)

## 2017-09-06 MED ORDER — INSULIN ASPART 100 UNIT/ML ~~LOC~~ SOLN
0.0000 [IU] | Freq: Every day | SUBCUTANEOUS | Status: DC
  Administered 2017-09-07: 3 [IU] via SUBCUTANEOUS

## 2017-09-06 MED ORDER — ASPIRIN 325 MG PO TABS
325.0000 mg | ORAL_TABLET | Freq: Every day | ORAL | Status: DC
  Administered 2017-09-07 – 2017-09-08 (×3): 325 mg via ORAL
  Filled 2017-09-06 (×3): qty 1

## 2017-09-06 MED ORDER — PHENOL 1.4 % MT LIQD
1.0000 | OROMUCOSAL | Status: DC | PRN
  Filled 2017-09-06: qty 177

## 2017-09-06 MED ORDER — ATORVASTATIN CALCIUM 80 MG PO TABS
80.0000 mg | ORAL_TABLET | Freq: Every day | ORAL | Status: DC
  Administered 2017-09-07: 80 mg via ORAL
  Filled 2017-09-06: qty 1

## 2017-09-06 MED ORDER — ENOXAPARIN SODIUM 40 MG/0.4ML ~~LOC~~ SOLN
40.0000 mg | SUBCUTANEOUS | Status: DC
  Administered 2017-09-07 (×2): 40 mg via SUBCUTANEOUS
  Filled 2017-09-06 (×2): qty 0.4

## 2017-09-06 MED ORDER — ACETAMINOPHEN 160 MG/5ML PO SOLN: 650.0000 mg | ORAL | Status: DC | PRN

## 2017-09-06 MED ORDER — INSULIN ASPART 100 UNIT/ML ~~LOC~~ SOLN
0.0000 [IU] | Freq: Three times a day (TID) | SUBCUTANEOUS | Status: DC
  Administered 2017-09-07: 2 [IU] via SUBCUTANEOUS
  Administered 2017-09-08: 3 [IU] via SUBCUTANEOUS
  Administered 2017-09-08: 1 [IU] via SUBCUTANEOUS

## 2017-09-06 MED ORDER — POLYVINYL ALCOHOL 1.4 % OP SOLN
1.0000 [drp] | OPHTHALMIC | Status: DC | PRN
  Filled 2017-09-06: qty 15

## 2017-09-06 MED ORDER — STROKE: EARLY STAGES OF RECOVERY BOOK
Freq: Once | Status: AC
  Administered 2017-09-07: 09:00:00
  Filled 2017-09-06 (×2): qty 1

## 2017-09-06 MED ORDER — TRIAMCINOLONE ACETONIDE 0.1 % EX CREA
1.0000 "application " | TOPICAL_CREAM | Freq: Two times a day (BID) | CUTANEOUS | Status: DC | PRN
  Filled 2017-09-06: qty 15

## 2017-09-06 MED ORDER — LORATADINE 10 MG PO TABS: 10.0000 mg | ORAL_TABLET | Freq: Every day | ORAL | Status: DC | PRN

## 2017-09-06 MED ORDER — HYDRALAZINE HCL 20 MG/ML IJ SOLN: 10.0000 mg | INTRAMUSCULAR | Status: DC | PRN

## 2017-09-06 MED ORDER — ACETAMINOPHEN 650 MG RE SUPP: 650.0000 mg | RECTAL | Status: DC | PRN

## 2017-09-06 MED ORDER — ACETAMINOPHEN 325 MG PO TABS: 650.0000 mg | ORAL_TABLET | ORAL | Status: DC | PRN

## 2017-09-06 MED ORDER — SODIUM CHLORIDE 0.9 % IV BOLUS
500.0000 mL | Freq: Once | INTRAVENOUS | Status: AC
  Administered 2017-09-06: 500 mL via INTRAVENOUS

## 2017-09-06 MED ORDER — SALINE SPRAY 0.65 % NA SOLN
1.0000 | NASAL | Status: DC | PRN
  Filled 2017-09-06: qty 44

## 2017-09-06 MED ORDER — IOPAMIDOL (ISOVUE-370) INJECTION 76%
100.0000 mL | Freq: Once | INTRAVENOUS | Status: AC | PRN
  Administered 2017-09-06: 100 mL via INTRAVENOUS

## 2017-09-06 MED ORDER — SODIUM CHLORIDE 0.9 % IV SOLN
INTRAVENOUS | Status: DC
  Administered 2017-09-07: via INTRAVENOUS

## 2017-09-06 MED ORDER — LIP MEDEX EX OINT
1.0000 "application " | TOPICAL_OINTMENT | CUTANEOUS | Status: DC | PRN
  Filled 2017-09-06: qty 7

## 2017-09-06 NOTE — Consult Note (Signed)
Neurology Consultation  Reason for Consult: Stroke Referring Physician: Dr. Lita Mains EDP. Referred by Ophthalmology - Dr Kathrin Penner  CC: Left visual field disturbance  History is obtained from: Patient, patient's wife, outpatient ophthalmology note  HPI: Tony Ou. is a 77 y.o. male past medical history of PSVT status post ablation, old stroke with no residual deficit, diabetes, hypertension, hyperlipidemia, PSVT, presented to his ophthalmologist office for evaluation of visual field loss. According to both him and his wife were at bedside, he was last normal at noon on 09/06/2017 and they were working outside in the yard in the heat.  As he came inside the house he said he is not feeling right.  It was not until 1 PM that he realized that he is not able to see on the left lower quadrant.  He thought it is from his left eye.  He went to the ophthalmologist.  An examination was done by the ophthalmologist and concern for left homonymous quadrantanopsia and he was sent to the emergency room via private vehicle. His wife drove him to the ER.  He arrived at the ER around 5 PM-please refer to the nursing charting times for exact times. He was seen at the ER bridge. Initial NIH stroke scale 1 for visual Taken in for a stat noncontrast CT of the head that did not show any bleed.  It showed an old right MCA stroke and small vessel disease. CT angiogram head and neck was ordered along with CT perfusion study. Not a candidate for TPA due to being outside the window.  LKW: 12 PM on 09/06/2017 tpa given?: no, outside the window Premorbid modified Rankin scale (mRS): 1  ROS: ROS was performed and is negative except as noted in the HPI.   Past Medical History:  Diagnosis Date  . Cataract    in both eyes    had cataracts surgically removed  . CVA (cerebral infarction)   . Diabetes mellitus   . ED (erectile dysfunction)   . Elevated PSA   . Frequency of urination   . GERD (gastroesophageal  reflux disease)   . History of nephrolithiasis   . Hyperlipidemia   . Hypertension   . Lung nodule   . Psoriasis   . PSVT (paroxysmal supraventricular tachycardia) ([place])   . Stroke [facility])    2015   Family History  Problem Relation Age of Onset  . Cancer Mother        breast cancer  . Diabetes Mother   . Colon polyps Brother   . Heart disease Neg Hx   . Colon cancer Neg Hx   . Esophageal cancer Neg Hx   . Rectal cancer Neg Hx   . Stomach cancer Neg Hx    Social History:   reports that he has quit smoking. His smoking use included cigarettes. He has a 5.00 pack-year smoking history. His smokeless tobacco use includes chew. He reports that he does not drink alcohol or use drugs.  Medications  Current Facility-Administered Medications:  .  0.9 %  sodium chloride infusion, 500 mL, Intravenous, Once, Danis, Estill Cotta III, MD  Current Outpatient Medications:  .  amLODipine (NORVASC) 10 MG tablet, TAKE 1 TABLET BY MOUTH EVERY DAY, Disp: 90 tablet, Rfl: 3 .  aspirin EC 81 MG tablet, Take 81 mg by mouth daily., Disp: , Rfl:  .  glipiZIDE (GLUCOTROL XL) 5 MG 24 hr tablet, TAKE 1 TABLET BY MOUTH EVERY DAY WITH BREAKFAST, Disp: 90 tablet, Rfl: 3 .  glucose blood (ONETOUCH VERIO) test strip, Use 1 strip daily to check blood sugar Dx Code E11.21, Disp: 100 each, Rfl: 3 .  ketoconazole (NIZORAL) 2 % shampoo, APPLY 1 APPLICATION TOPICALLY 2 (TWO) TIMES A WEEK., Disp: 120 mL, Rfl: 2 .  lisinopril (PRINIVIL,ZESTRIL) 40 MG tablet, TAKE 1 TABLET BY MOUTH EVERY DAY, Disp: 90 tablet, Rfl: 3 .  metFORMIN (GLUCOPHAGE) 1000 MG tablet, TAKE 1 TABLET BY MOUTH TWICE A DAY WITH A MEAL, Disp: 180 tablet, Rfl: 3 .  mometasone (ELOCON) 0.1 % cream, Apply 1 application topically 2 (two) times daily as needed (skin)., Disp: 45 g, Rfl: 1 .  ONETOUCH DELICA LANCETS FINE MISC, Use 1 daily to obtain blood sample Dx Code E11.21, Disp: 100 each, Rfl: 3 .  simvastatin (ZOCOR) 40 MG tablet, TAKE 1 TABLET BY MOUTH EVERY  DAY AT BEDTIME, Disp: 90 tablet, Rfl: 3 .  triamcinolone cream (KENALOG) 0.1 %, Apply 1 application topically 2 (two) times daily as needed (itching)., Disp: 30 g, Rfl: 1  Exam: Current vital signs: BP (!) 152/92   Pulse 83   Temp 99.3 F (37.4 C) (Oral)   Resp 19   Wt 76.2 kg (168 lb)   SpO2 100%   BMI 25.54 kg/m  Vital signs in last 24 hours: Temp:  [99.3 F (37.4 C)] 99.3 F (37.4 C) (07/18 1742) Pulse Rate:  [79-83] 83 (07/18 1745) Resp:  [18-19] 19 (07/18 1745) BP: (130-152)/(82-92) 152/92 (07/18 1745) SpO2:  [97 %-100 %] 100 % (07/18 1745) Weight:  [76.2 kg (168 lb)] 76.2 kg (168 lb) (07/18 1743) Normotensive GENERAL: Awake, alert in NAD HEENT: - Normocephalic and atraumatic, dry mm, no LN++, no Thyromegally LUNGS - Clear to auscultation bilaterally with no wheezes CV - S1S2 RRR, no m/r/g, equal pulses bilaterally. ABDOMEN - Soft, nontender, nondistended with normoactive BS Ext: warm, well perfused, intact peripheral pulses, no edema  NEURO:  Mental Status: AA&Ox3 Language: Speech is clear naming comprehension repetition intact Cranial Nerves: Both pupils are 7 mm nonresponsive as there was dilated at the ophthalmologist office, EOMI, visual field exam shows left lower quadrantanopsia that is homonymous,, no facial asymmetry, facial sensation intact, hearing intact, tongue/uvula/soft palate midline, normal sternocleidomastoid and trapezius muscle strength. No evidence of tongue atrophy or fibrillations Motor: 5/5 all over Tone: is normal and bulk is normal Sensation- Intact to light touch bilaterally Coordination: FTN intact bilaterally, no ataxia in BLE. Gait- deferred  NIHSS-1 for visual   Labs I have reviewed labs in epic and the results pertinent to this consultation are: CBC    Component Value Date/Time   WBC 6.9 05/09/2017 1029   RBC 4.72 05/09/2017 1029   HGB 14.6 05/09/2017 1029   HCT 42.6 05/09/2017 1029   PLT 242.0 05/09/2017 1029   MCV 90.2  05/09/2017 1029   MCV 88.1 07/22/2015 1642   MCH 31.3 (A) 07/22/2015 1642   MCH 31.3 07/17/2014 0824   MCHC 34.2 05/09/2017 1029   RDW 13.6 05/09/2017 1029   LYMPHSABS 1.2 02/18/2016 1024   MONOABS 0.6 02/18/2016 1024   EOSABS 0.2 02/18/2016 1024   BASOSABS 0.1 02/18/2016 1024    CMP     Component Value Date/Time   NA 140 08/16/2017 0850   K 4.7 08/16/2017 0850   CL 107 08/16/2017 0850   CO2 23 08/16/2017 0850   GLUCOSE 152 (H) 08/16/2017 0850   BUN 28 (H) 08/16/2017 0850   CREATININE 1.57 (H) 08/16/2017 0850   CALCIUM 10.1 08/16/2017 0850  PROT 7.5 05/09/2017 1029   ALBUMIN 5.1 05/09/2017 1029   AST 18 05/09/2017 1029   ALT 22 05/09/2017 1029   ALKPHOS 50 05/09/2017 1029   BILITOT 0.4 05/09/2017 1029   GFRNONAA 42 (L) 08/16/2017 0850   GFRAA 49 (L) 08/16/2017 0850  Imaging I have reviewed the images obtained: noncontrast CT of the head that did not show any bleed.  It showed an old right MCA stroke and small vessel disease. CT angiogram and perfusion head and neck No emergent LVO.  Moderate to severe stenosis of right P2 which is fetal in origin and multifocal intracranial atherosclerosis.  3 mm left P-comm aneurysm. CT perfusion showed a small 6 cc area of right occipital lobe perfusion deficit  Assessment:  77 year old man past history of PSVT status post ablation not on anticoagulation, old stroke with no residual deficits, diabetes, hypertension hyperlipidemia PSVT presented to ophthalmologist for evaluation of visual field loss on the left Noted to have left lower quadrantanopsia that was homonymous and sent to the emergency room for emergent stroke evaluation. By the time he arrived at the ER, he was outside the window for IV TPA. Vascular imaging did not reveal any emergent large vessel occlusion but showed a moderate to severe stenosis of right P2 that is fetal in origin and multifocal intracranial lateral sclerosis. It is my suspicion that this was probably a  hemodynamic process in the setting of dehydration as he was working outside or a cardioembolic process as he still might have proximal atrial for ablation.  Next line he has a loop recorder implanted many years ago, which is probably not working anymore. Admit for stroke work-up  Impression: Acute ischemic stroke in the right occipital lobe by CT perfusion standards Evaluate for paroxysmal atrial fibrillation.  Has a history of PSVT status post ablation and cryptogenic stroke in the past with implantable loop recorder that is probably passed its battery age  Recommendations: -Admit to hospitalist or observation -Telemetry monitoring -Allow for permissive hypertension for the first 24-48h - only treat PRN if SBP >220 mmHg. Blood pressures can be gradually normalized to SBP<140 upon discharge. -MRI brain without contrast -CT Angiogram of Head and neck -Echocardiogram -HgbA1c, fasting lipid panel -Frequent neuro checks -Prophylactic therapy-Antiplatelet med: Aspirin 325 and Plavix 75 due to intracranial lateral sclerosis -Atorvastatin 80 mg PO daily -Risk factor modification -PT consult, OT consult, Speech consult -May need long-term outpatient cardiac monitoring. -I have advised that he avoid dehydration in the future because of his intracranial atherosclerosis  Please page stroke NP/PA/MD (listed on AMION)  from 8am-4 pm as this patient will be followed by the stroke team at this point.  -- Amie Portland, MD [facility] Pager: [phone] If 7pm to 7am, please call on call as listed on AMION.

## 2017-09-06 NOTE — ED Provider Notes (Signed)
[place] EMERGENCY DEPARTMENT Provider Note   CSN: [id number] Arrival date & time: 09/06/17  1653     History   Chief Complaint Chief Complaint  Patient presents with  . Loss of Vision    HPI Jasper Hanf. is a 77 y.o. male.  HPI Patient presents from ophthalmologist office with visual field deficits that started at noon today.  States he was working outside when he noticed his visual disturbance.  Denies headache, speech changes, focal weakness or numbness.  Denied chest pain or shortness of breath.  Examined by ophthalmologist and discussed with Dr. Malen Gauze.  Patient transferred as code stroke. Past Medical History:  Diagnosis Date  . Cataract    in both eyes    had cataracts surgically removed  . CVA (cerebral infarction)   . Diabetes mellitus   . ED (erectile dysfunction)   . Elevated PSA   . Frequency of urination   . GERD (gastroesophageal reflux disease)   . History of nephrolithiasis   . Hyperlipidemia   . Hypertension   . Lung nodule   . Psoriasis   . PSVT (paroxysmal supraventricular tachycardia) ([place])   . Stroke [facility])    2015    Patient Active Problem List   Diagnosis Date Noted  . Hyperparathyroidism ([place]) 06/18/2017  . Vitamin D deficiency 06/18/2017  . CKD stage 3 due to type 2 diabetes mellitus ([place]) 01/27/2015  . Advance directive discussed with patient 01/27/2015  . SVT (supraventricular tachycardia) ([place]) 09/24/2014  . Essential hypertension   . Late effect of cerebrovascular accident (CVA)   . Pulmonary nodules 10/21/2013  . CAD (coronary artery disease) 10/08/2013  . Prostatic intraepithelial neoplasia 04/12/2012  . Actinic keratosis 10/12/2011  . Routine general medical examination at a health care facility 10/07/2010  . NEPHROLITHIASIS, HX OF 03/18/2010  . ERECTILE DYSFUNCTION, ORGANIC 02/19/2008  . Elevated cholesterol 11/13/2006  . PSORIASIS 11/13/2006    Past Surgical History:  Procedure Laterality Date  .  CATARACT EXTRACTION W/ INTRAOCULAR LENS IMPLANT Right 03/30/12   Dr Kathrin Penner  . COLONOSCOPY    . 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 NM MYOVIEW LTD     normal EF 56% 03/08  . POLYPECTOMY    . PROSTATE BIOPSY  01/05/2012   Procedure: BIOPSY TRANSRECTAL ULTRASONIC PROSTATE (TUBP);  Surgeon: [name], MD;  Location: [facility];  Service: Urology;  Laterality: N/A;  . TEE WITHOUT CARDIOVERSION N/A 05/21/2014   Procedure: TRANSESOPHAGEAL ECHOCARDIOGRAM (TEE);  Surgeon: Jerline Pain, MD;  Location: [facility] ENDOSCOPY;  Service: Cardiovascular;  Laterality: N/A;  . [place] Medications    Prior to Admission medications   Medication Sig Start Date End Date Taking? Authorizing Provider  amLODipine (NORVASC) 10 MG tablet TAKE 1 TABLET BY MOUTH EVERY DAY 02/08/17  Yes Viviana Simpler I, MD  aspirin EC 81 MG tablet Take 81 mg by mouth daily.   Yes [provider]  glipiZIDE (GLUCOTROL XL) 5 MG 24 hr tablet TAKE 1 TABLET BY MOUTH EVERY DAY WITH BREAKFAST 02/08/17  Yes Viviana Simpler I, MD  ketoconazole (NIZORAL) 2 % shampoo APPLY 1 APPLICATION TOPICALLY 2 (TWO) TIMES A WEEK. 11/17/16  Yes Viviana Simpler I, MD  lisinopril (PRINIVIL,ZESTRIL) 40 MG tablet TAKE 1 TABLET BY MOUTH EVERY DAY 02/08/17  Yes Viviana Simpler I, MD  metFORMIN (GLUCOPHAGE) 1000 MG tablet TAKE 1 TABLET BY MOUTH TWICE A  DAY WITH A MEAL 02/08/17  Yes [name], MD  mometasone (ELOCON) 0.1 % cream Apply 1 application topically 2 (two) times daily as needed (skin). 02/18/16  Yes [name], MD  simvastatin (ZOCOR) 40 MG tablet TAKE 1 TABLET BY MOUTH EVERY DAY AT BEDTIME 02/08/17  Yes [name], MD  triamcinolone cream (KENALOG) 0.1 % Apply 1 application topically 2 (two) times daily as needed (itching). 11/17/16  Yes Viviana Simpler I, MD  glucose blood [facility] VERIO) test strip Use 1 strip daily to check blood sugar Dx Code  E11.21 08/10/16   [name], MD  [facility] DELICA LANCETS FINE MISC Use 1 daily to obtain blood sample Dx Code E11.21 08/10/16   [name], MD    Family History Family History  Problem Relation Age of Onset  . Cancer Mother        breast cancer  . Diabetes Mother   . Colon polyps Brother   . Heart disease Neg Hx   . Colon cancer Neg Hx   . Esophageal cancer Neg Hx   . Rectal cancer Neg Hx   . Stomach cancer Neg Hx     Social History Social History   Tobacco Use  . Smoking status: Former Smoker    Packs/day: 1.00    Years: 5.00    Pack years: 5.00    Types: Cigarettes  . Smokeless tobacco: Current User    Types: Chew  . Tobacco comment: QUIT SMOKING CIGARETTES 50 YRS AGO--  CHEWED TOBACCO FOR 40 YRS  Substance Use Topics  . Alcohol use: No    Alcohol/week: 0.0 oz  . Drug use: No     Allergies   Patient has no known allergies.   Review of Systems Review of Systems  Constitutional: Negative for chills and fever.  HENT: Negative for facial swelling and trouble swallowing.   Eyes: Positive for visual disturbance. Negative for photophobia and pain.  Respiratory: Negative for cough and shortness of breath.   Cardiovascular: Negative for chest pain, palpitations and leg swelling.  Gastrointestinal: Negative for abdominal pain, constipation, diarrhea, nausea and vomiting.  Genitourinary: Negative for dysuria, frequency and hematuria.  Musculoskeletal: Negative for back pain, myalgias and neck pain.  Skin: Negative for rash and wound.  Neurological: Negative for dizziness, syncope, speech difficulty, weakness, light-headedness, numbness and headaches.  All other systems reviewed and are negative.    Physical Exam Updated Vital Signs BP 137/89   Pulse 81   Temp 99.3 F (37.4 C) (Oral)   Resp 15   Wt 76.2 kg (168 lb)   SpO2 98%   BMI 25.54 kg/m   Physical Exam  Constitutional: He is oriented to person, place, and time. He appears well-developed  and well-nourished. No distress.  HENT:  Head: Normocephalic and atraumatic.  Mouth/Throat: Oropharynx is clear and moist. No oropharyngeal exudate.  Eyes: Pupils are equal, round, and reactive to light. EOM are normal.  Left lower visual field deficit in both sides.  Neck: Normal range of motion. Neck supple. No JVD present.  Cardiovascular: Normal rate and regular rhythm. Exam reveals no gallop and no friction rub.  No murmur heard. Pulmonary/Chest: Effort normal and breath sounds normal.  Abdominal: Soft. Bowel sounds are normal. There is no tenderness. There is no rebound and no guarding.  Musculoskeletal: Normal range of motion. He exhibits no edema or tenderness.  Mild bilateral lower extremity edema.  No calf asymmetry or tenderness.  Lymphadenopathy:    He  has no cervical adenopathy.  Neurological: He is alert and oriented to person, place, and time.  Patient is alert and oriented x3 with clear, goal oriented speech. Patient has 5/5 motor in all extremities. Sensation is intact to light touch.   Skin: Skin is warm and dry. Capillary refill takes less than 2 seconds. No rash noted. He is not diaphoretic. No erythema.  Psychiatric: He has a normal mood and affect. His behavior is normal.  Nursing note and vitals reviewed.    ED Treatments / Results  Labs (all labs ordered are listed, but only abnormal results are displayed) Labs Reviewed  COMPREHENSIVE METABOLIC PANEL - Abnormal; Notable for the following components:      Result Value   Glucose, Bld 124 (*)    BUN 37 (*)    Creatinine, Ser 1.71 (*)    Calcium 10.5 (*)    GFR calc non Af Amer 37 (*)    GFR calc Af Amer 43 (*)    All other components within normal limits  PROTIME-INR  CBC WITH DIFFERENTIAL/PLATELET  I-STAT CHEM 8, ED  I-STAT TROPONIN, ED  I-STAT CHEM 8, ED  I-STAT TROPONIN, ED    EKG EKG Interpretation  Date/Time:  Thursday September 06 2017 17:41:26 EDT Ventricular Rate:  81 PR Interval:    QRS  Duration: 89 QT Interval:  382 QTC Calculation: 444 R Axis:   0 Text Interpretation:  Sinus rhythm Prolonged PR interval Abnormal R-wave progression, early transition Confirmed by Julianne Rice [phone]) on 09/06/2017 6:00:12 PM   Radiology Ct Angio Head W Or Wo Contrast  Result Date: 09/06/2017 CLINICAL DATA:  Follow up code stroke. History of RIGHT MCA infarct. EXAM: CT ANGIOGRAPHY HEAD AND NECK CT PERFUSION BRAIN TECHNIQUE: Multidetector CT imaging of the head and neck was performed using the standard protocol during bolus administration of intravenous contrast. Multiplanar CT image reconstructions and MIPs were obtained to evaluate the vascular anatomy. Carotid stenosis measurements (when applicable) are obtained utilizing NASCET criteria, using the distal internal carotid diameter as the denominator. Multiphase CT imaging of the brain was performed following IV bolus contrast injection. Subsequent parametric perfusion maps were calculated using RAPID software. CONTRAST:  90 cc ISOVUE-370 IOPAMIDOL (ISOVUE-370) INJECTION 76% COMPARISON:  CT HEAD September 06, 2017 and MRA head May 20, 2014. FINDINGS: CTA NECK FINDINGS AORTIC ARCH: Normal appearance of the thoracic arch, 2 vessel arch is a normal variant. Mild intimal thickening aortic arch and arch vessel origins. The origins of the innominate, left Common carotid artery and subclavian artery are widely patent. RIGHT CAROTID SYSTEM: Common carotid artery is widely patent, coursing in a straight line fashion. Normal appearance of the carotid bifurcation without hemodynamically significant stenosis by NASCET criteria. Normal appearance of the included internal carotid artery. LEFT CAROTID SYSTEM: Common carotid artery is widely patent, coursing in a straight line fashion. Normal appearance of the carotid bifurcation without hemodynamically significant stenosis by NASCET criteria. Normal appearance of the included internal carotid artery. VERTEBRAL  ARTERIES:Codominant vertebral arteries. Normal appearance of the vertebral arteries, which appear widely patent. SKELETON: No acute osseous process though bone windows have not been submitted. OTHER NECK: Soft tissues of the neck are nonacute though, not tailored for evaluation. UPPER CHEST: Included lung apices are clear. No superior mediastinal lymphadenopathy. CTA HEAD FINDINGS ANTERIOR CIRCULATION: Patent cervical internal carotid arteries, petrous, cavernous and supra clinoid internal carotid arteries. 3 mm wide neck inferiorly directed LEFT PCOM origin aneurysm. Patent anterior communicating artery. Patent anterior and middle cerebral arteries,  multifocal moderate stenoses progressed from prior MRA. No large vessel occlusion, significant stenosis, contrast extravasation. POSTERIOR CIRCULATION: Patent vertebral arteries, vertebrobasilar junction and basilar artery, as well as main branch vessels. Patent posterior cerebral arteries, multifocal moderate stenosis. Moderate to severe stenosis distal RIGHT P2 segment new from prior MRA. No large vessel occlusion, contrast extravasation or aneurysm. VENOUS SINUSES: Major dural venous sinuses are patent though not tailored for evaluation on this angiographic examination. ANATOMIC VARIANTS: Hypoplastic RIGHT A1 segment. Fetal origin RIGHT posterior cerebral artery. DELAYED PHASE: Not performed. MIP images reviewed. CT Brain Perfusion Findings: CBF (<30%) Volume: 95mL Perfusion (Tmax>6.0s) volume: 63mL Mismatch Volume: 58mL Infarction Location:RIGHT mesial occipital lobe. IMPRESSION: CTA NECK: 1. No hemodynamically significant stenosis ICA. Patent vertebral arteries. CTA HEAD: 1.  No emergent large vessel occlusion. 2. Moderate to severe stenosis distal RIGHT P2 segment (fetal origin). Multifocal cerebral artery moderate stenosis compatible with atherosclerosis. 3. Intact 3 mm LEFT PCOM aneurysm. Neuro-Interventional Radiology consultation is suggested to evaluate the  appropriateness of potential treatment. Non-emergent evaluation can be arranged by calling [phone] during usual hours. CT PERFUSION: 1. Small area mesial RIGHT occipital lobe/PCA territory brain at risk. Acute findings discussed with and reconfirmed by Dr.ASHISH ARORA on 09/06/2017 at 5:45 pm. Electronically Signed   By: Elon Alas M.D.   On: 09/06/2017 17:55   Ct Angio Neck W Or Wo Contrast  Result Date: 09/06/2017 CLINICAL DATA:  Follow up code stroke. History of RIGHT MCA infarct. EXAM: CT ANGIOGRAPHY HEAD AND NECK CT PERFUSION BRAIN TECHNIQUE: Multidetector CT imaging of the head and neck was performed using the standard protocol during bolus administration of intravenous contrast. Multiplanar CT image reconstructions and MIPs were obtained to evaluate the vascular anatomy. Carotid stenosis measurements (when applicable) are obtained utilizing NASCET criteria, using the distal internal carotid diameter as the denominator. Multiphase CT imaging of the brain was performed following IV bolus contrast injection. Subsequent parametric perfusion maps were calculated using RAPID software. CONTRAST:  90 cc ISOVUE-370 IOPAMIDOL (ISOVUE-370) INJECTION 76% COMPARISON:  CT HEAD September 06, 2017 and MRA head May 20, 2014. FINDINGS: CTA NECK FINDINGS AORTIC ARCH: Normal appearance of the thoracic arch, 2 vessel arch is a normal variant. Mild intimal thickening aortic arch and arch vessel origins. The origins of the innominate, left Common carotid artery and subclavian artery are widely patent. RIGHT CAROTID SYSTEM: Common carotid artery is widely patent, coursing in a straight line fashion. Normal appearance of the carotid bifurcation without hemodynamically significant stenosis by NASCET criteria. Normal appearance of the included internal carotid artery. LEFT CAROTID SYSTEM: Common carotid artery is widely patent, coursing in a straight line fashion. Normal appearance of the carotid bifurcation without  hemodynamically significant stenosis by NASCET criteria. Normal appearance of the included internal carotid artery. VERTEBRAL ARTERIES:Codominant vertebral arteries. Normal appearance of the vertebral arteries, which appear widely patent. SKELETON: No acute osseous process though bone windows have not been submitted. OTHER NECK: Soft tissues of the neck are nonacute though, not tailored for evaluation. UPPER CHEST: Included lung apices are clear. No superior mediastinal lymphadenopathy. CTA HEAD FINDINGS ANTERIOR CIRCULATION: Patent cervical internal carotid arteries, petrous, cavernous and supra clinoid internal carotid arteries. 3 mm wide neck inferiorly directed LEFT PCOM origin aneurysm. Patent anterior communicating artery. Patent anterior and middle cerebral arteries, multifocal moderate stenoses progressed from prior MRA. No large vessel occlusion, significant stenosis, contrast extravasation. POSTERIOR CIRCULATION: Patent vertebral arteries, vertebrobasilar junction and basilar artery, as well as main branch vessels. Patent posterior cerebral arteries, multifocal moderate stenosis.  Moderate to severe stenosis distal RIGHT P2 segment new from prior MRA. No large vessel occlusion, contrast extravasation or aneurysm. VENOUS SINUSES: Major dural venous sinuses are patent though not tailored for evaluation on this angiographic examination. ANATOMIC VARIANTS: Hypoplastic RIGHT A1 segment. Fetal origin RIGHT posterior cerebral artery. DELAYED PHASE: Not performed. MIP images reviewed. CT Brain Perfusion Findings: CBF (<30%) Volume: 33mL Perfusion (Tmax>6.0s) volume: 63mL Mismatch Volume: 75mL Infarction Location:RIGHT mesial occipital lobe. IMPRESSION: CTA NECK: 1. No hemodynamically significant stenosis ICA. Patent vertebral arteries. CTA HEAD: 1.  No emergent large vessel occlusion. 2. Moderate to severe stenosis distal RIGHT P2 segment (fetal origin). Multifocal cerebral artery moderate stenosis compatible with  atherosclerosis. 3. Intact 3 mm LEFT PCOM aneurysm. Neuro-Interventional Radiology consultation is suggested to evaluate the appropriateness of potential treatment. Non-emergent evaluation can be arranged by calling [phone] during usual hours. CT PERFUSION: 1. Small area mesial RIGHT occipital lobe/PCA territory brain at risk. Acute findings discussed with and reconfirmed by Dr.ASHISH ARORA on 09/06/2017 at 5:45 pm. Electronically Signed   By: Elon Alas M.D.   On: 09/06/2017 17:55   Ct Cerebral Perfusion W Contrast  Result Date: 09/06/2017 CLINICAL DATA:  Follow up code stroke. History of RIGHT MCA infarct. EXAM: CT ANGIOGRAPHY HEAD AND NECK CT PERFUSION BRAIN TECHNIQUE: Multidetector CT imaging of the head and neck was performed using the standard protocol during bolus administration of intravenous contrast. Multiplanar CT image reconstructions and MIPs were obtained to evaluate the vascular anatomy. Carotid stenosis measurements (when applicable) are obtained utilizing NASCET criteria, using the distal internal carotid diameter as the denominator. Multiphase CT imaging of the brain was performed following IV bolus contrast injection. Subsequent parametric perfusion maps were calculated using RAPID software. CONTRAST:  90 cc ISOVUE-370 IOPAMIDOL (ISOVUE-370) INJECTION 76% COMPARISON:  CT HEAD September 06, 2017 and MRA head May 20, 2014. FINDINGS: CTA NECK FINDINGS AORTIC ARCH: Normal appearance of the thoracic arch, 2 vessel arch is a normal variant. Mild intimal thickening aortic arch and arch vessel origins. The origins of the innominate, left Common carotid artery and subclavian artery are widely patent. RIGHT CAROTID SYSTEM: Common carotid artery is widely patent, coursing in a straight line fashion. Normal appearance of the carotid bifurcation without hemodynamically significant stenosis by NASCET criteria. Normal appearance of the included internal carotid artery. LEFT CAROTID SYSTEM: Common  carotid artery is widely patent, coursing in a straight line fashion. Normal appearance of the carotid bifurcation without hemodynamically significant stenosis by NASCET criteria. Normal appearance of the included internal carotid artery. VERTEBRAL ARTERIES:Codominant vertebral arteries. Normal appearance of the vertebral arteries, which appear widely patent. SKELETON: No acute osseous process though bone windows have not been submitted. OTHER NECK: Soft tissues of the neck are nonacute though, not tailored for evaluation. UPPER CHEST: Included lung apices are clear. No superior mediastinal lymphadenopathy. CTA HEAD FINDINGS ANTERIOR CIRCULATION: Patent cervical internal carotid arteries, petrous, cavernous and supra clinoid internal carotid arteries. 3 mm wide neck inferiorly directed LEFT PCOM origin aneurysm. Patent anterior communicating artery. Patent anterior and middle cerebral arteries, multifocal moderate stenoses progressed from prior MRA. No large vessel occlusion, significant stenosis, contrast extravasation. POSTERIOR CIRCULATION: Patent vertebral arteries, vertebrobasilar junction and basilar artery, as well as main branch vessels. Patent posterior cerebral arteries, multifocal moderate stenosis. Moderate to severe stenosis distal RIGHT P2 segment new from prior MRA. No large vessel occlusion, contrast extravasation or aneurysm. VENOUS SINUSES: Major dural venous sinuses are patent though not tailored for evaluation on this angiographic examination. ANATOMIC VARIANTS: Hypoplastic  RIGHT A1 segment. Fetal origin RIGHT posterior cerebral artery. DELAYED PHASE: Not performed. MIP images reviewed. CT Brain Perfusion Findings: CBF (<30%) Volume: 68mL Perfusion (Tmax>6.0s) volume: 22mL Mismatch Volume: 67mL Infarction Location:RIGHT mesial occipital lobe. IMPRESSION: CTA NECK: 1. No hemodynamically significant stenosis ICA. Patent vertebral arteries. CTA HEAD: 1.  No emergent large vessel occlusion. 2. Moderate  to severe stenosis distal RIGHT P2 segment (fetal origin). Multifocal cerebral artery moderate stenosis compatible with atherosclerosis. 3. Intact 3 mm LEFT PCOM aneurysm. Neuro-Interventional Radiology consultation is suggested to evaluate the appropriateness of potential treatment. Non-emergent evaluation can be arranged by calling [phone] during usual hours. CT PERFUSION: 1. Small area mesial RIGHT occipital lobe/PCA territory brain at risk. Acute findings discussed with and reconfirmed by Dr.ASHISH ARORA on 09/06/2017 at 5:45 pm. Electronically Signed   By: Elon Alas M.D.   On: 09/06/2017 17:55   Ct Head Code Stroke Wo Contrast  Result Date: 09/06/2017 CLINICAL DATA:  Code stroke. Code stroke, LEFT gaze preference. Last seen normal at 1200 hours. EXAM: CT HEAD WITHOUT CONTRAST TECHNIQUE: Contiguous axial images were obtained from the base of the skull through the vertex without intravenous contrast. COMPARISON:  MRI head May 20, 2014 FINDINGS: BRAIN: No intraparenchymal hemorrhage, mass effect nor midline shift. Small area RIGHT frontal lobe encephalomalacia at convexity. Confluent supratentorial white matter hypodensities. Tiny basal ganglia and thalami hypodensities associated with chronic small vessel ischemic changes. Old bilateral small cerebellar infarcts. No acute large vascular territory infarcts. Mild parenchymal brain volume loss. No hydrocephalus. No abnormal extra-axial fluid collections. Basal cisterns are patent. VASCULAR: Mild calcific atherosclerosis of the carotid siphons. SKULL: No skull fracture. No significant scalp soft tissue swelling. SINUSES/ORBITS: The mastoid air-cells and included paranasal sinuses are well-aerated.The included ocular globes and orbital contents are non-suspicious. Status post bilateral ocular lens implants. OTHER: None. ASPECTS [facility] Stroke Program Early CT Score) - Ganglionic level infarction (caudate, lentiform nuclei, internal capsule, insula,  M1-M3 cortex): 7 - Supraganglionic infarction (M4-M6 cortex): 3 Total score (0-10 with 10 being normal): 10 IMPRESSION: 1. No acute intracranial process. 2. ASPECTS is 10. 3. Old small RIGHT frontal/MCA territory infarct. 4. Moderate to severe chronic small vessel ischemic disease. Old small cerebellar infarcts. 5. Critical Value/emergent results text paged to [place] via [employer] secure system on 09/06/2017 at 5:20 pm, including interpreting physician's phone number. Electronically Signed   By: Elon Alas M.D.   On: 09/06/2017 17:25    Procedures Procedures (including critical care time)  Medications Ordered in ED Medications  sodium chloride 0.9 % bolus 500 mL (500 mLs Intravenous New Bag/Given 09/06/17 2039)  iopamidol (ISOVUE-370) 76 % injection 100 mL (100 mLs Intravenous Contrast Given 09/06/17 1730)     Initial Impression / Assessment and Plan / ED Course  I have reviewed the triage vital signs and the nursing notes.  Pertinent labs & imaging results that were available during my care of the patient were reviewed by me and considered in my medical decision making (see chart for details).     This is not a candidate for TPA.  Evaluated by neurology.  Discussed with hospitalist who will see patient in the emergency department and admit.  Final Clinical Impressions(s) / ED Diagnoses   Final diagnoses:  Visual field defect  Dehydration    ED Discharge Orders    None       Julianne Rice, MD 09/06/17 2050

## 2017-09-06 NOTE — ED Notes (Signed)
Istat trop=0.00 at 17:25

## 2017-09-06 NOTE — H&P (Signed)
History and Physical    Tony Jackson. GHW:[id number] DOB: January 12, 1941 DOA: 09/06/2017  Referring MD/NP/PA: Julianne Rice, MD PCP: [name], MD  Patient coming from: Arrived via private vehicle   Chief Complaint: Loss of vision in left eye  I have personally briefly reviewed patient's old medical records in [place]   HPI: Tony Jackson. is a 77 y.o. male with medical history significant of HTN, HLD, CKD stage III, CVA with residual balance issues, hyperparathyroidism followed by Dr. Cruzita Lederer of endocrinology; who presents with complaints of peripheral vision loss in the left eye.  Patient was outside working in the heat for approximately 1 hour when he had acute onset of vision loss he noted in his left eye.  Patient has had previous stroke before in the past for which he has residual balance issues, but does not use any assistive aids. He admits to recently falling 1 week ago off of his lawn more hitting his chest which he has had some soreness.  He did not hit his head or lose consciousness.  Denies any other complaints such as headache, lightheadedness, nausea, vomiting, diarrhea, chest pain, or palpitations.  Lastly patient reports that he previously was on 325 mg of aspirin daily, but was cut back to 81 mg of aspirin daily sometime during his last health exam.  ED Course: Patient was seen as a code stroke by Dr. Rory Percy of neurology.  Upon admission into the emergency department patient was seen to have a temperature of 99.3 F, respirations 15-26, and all other vital signs maintained.  CT brain without contrast and CT angiograms of the head and neck revealed signs of a moderate stenosis at the distal right P2 segment.  Labs revealed CBC wnl, BUN 37, creatinine 1.71, calcium 10.5, and INR 1.01.  Patient was not a TPA candidate and was given 100 mL of normal saline IV fluids.  TRH called to admit.  At this time patient reports patient back to normal..  Review of Systems    Constitutional: Negative for chills, fever and malaise/fatigue.  HENT: Negative for ear discharge and nosebleeds.   Eyes: Negative for double vision and photophobia.       Positive for vision loss  Respiratory: Negative for cough and shortness of breath.   Cardiovascular: Negative for chest pain and leg swelling.  Gastrointestinal: Negative for abdominal pain, constipation, nausea and vomiting.  Genitourinary: Negative for dysuria and hematuria.  Musculoskeletal: Positive for joint pain. Negative for falls.  Skin: Negative for itching and rash.  Neurological: Negative for dizziness, loss of consciousness and headaches.  Endo/Heme/Allergies: Does not bruise/bleed easily.  Psychiatric/Behavioral: Negative for substance abuse and suicidal ideas.    Past Medical History:  Diagnosis Date  . Cataract    in both eyes    had cataracts surgically removed  . CVA (cerebral infarction)   . Diabetes mellitus   . ED (erectile dysfunction)   . Elevated PSA   . Frequency of urination   . GERD (gastroesophageal reflux disease)   . History of nephrolithiasis   . Hyperlipidemia   . Hypertension   . Lung nodule   . Psoriasis   . PSVT (paroxysmal supraventricular tachycardia) ([place])   . Stroke [facility])    2015    Past Surgical History:  Procedure Laterality Date  . CATARACT EXTRACTION W/ INTRAOCULAR LENS IMPLANT Right 03/30/12   Dr Kathrin Penner  . COLONOSCOPY    . 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 NM MYOVIEW LTD     normal EF 56% 03/08  . POLYPECTOMY    . PROSTATE BIOPSY  01/05/2012   Procedure: BIOPSY TRANSRECTAL ULTRASONIC PROSTATE (TUBP);  Surgeon: [name], MD;  Location: [facility];  Service: Urology;  Laterality: N/A;  . TEE WITHOUT CARDIOVERSION N/A 05/21/2014   Procedure: TRANSESOPHAGEAL ECHOCARDIOGRAM (TEE);  Surgeon: Jerline Pain, MD;  Location: [facility] ENDOSCOPY;  Service: Cardiovascular;  Laterality: N/A;  . TONSILLECTOMY  1960      reports that he has quit smoking. His smoking use included cigarettes. He has a 5.00 pack-year smoking history. His smokeless tobacco use includes chew. He reports that he does not drink alcohol or use drugs.  No Known Allergies  Family History  Problem Relation Age of Onset  . Cancer Mother        breast cancer  . Diabetes Mother   . Colon polyps Brother   . Heart disease Neg Hx   . Colon cancer Neg Hx   . Esophageal cancer Neg Hx   . Rectal cancer Neg Hx   . Stomach cancer Neg Hx     Prior to Admission medications   Medication Sig Start Date End Date Taking? Authorizing Provider  amLODipine (NORVASC) 10 MG tablet TAKE 1 TABLET BY MOUTH EVERY DAY 02/08/17  Yes Viviana Simpler I, MD  aspirin EC 81 MG tablet Take 81 mg by mouth daily.   Yes [provider]  glipiZIDE (GLUCOTROL XL) 5 MG 24 hr tablet TAKE 1 TABLET BY MOUTH EVERY DAY WITH BREAKFAST 02/08/17  Yes Viviana Simpler I, MD  ketoconazole (NIZORAL) 2 % shampoo APPLY 1 APPLICATION TOPICALLY 2 (TWO) TIMES A WEEK. 11/17/16  Yes Viviana Simpler I, MD  lisinopril (PRINIVIL,ZESTRIL) 40 MG tablet TAKE 1 TABLET BY MOUTH EVERY DAY 02/08/17  Yes Viviana Simpler I, MD  metFORMIN (GLUCOPHAGE) 1000 MG tablet TAKE 1 TABLET BY MOUTH TWICE A DAY WITH A MEAL 02/08/17  Yes Viviana Simpler I, MD  mometasone (ELOCON) 0.1 % cream Apply 1 application topically 2 (two) times daily as needed (skin). 02/18/16  Yes [name], MD  simvastatin (ZOCOR) 40 MG tablet TAKE 1 TABLET BY MOUTH EVERY DAY AT BEDTIME 02/08/17  Yes [name], MD  triamcinolone cream (KENALOG) 0.1 % Apply 1 application topically 2 (two) times daily as needed (itching). 11/17/16  Yes Viviana Simpler I, MD  glucose blood [facility] VERIO) test strip Use 1 strip daily to check blood sugar Dx Code E11.21 08/10/16   [name], MD  [facility] DELICA LANCETS FINE MISC Use 1 daily to obtain blood sample Dx Code E11.21 08/10/16   [name], MD     Physical Exam:  Constitutional: NAD, calm, comfortable Vitals:   09/06/17 1745 09/06/17 1815 09/06/17 2000 09/06/17 2030  BP: (!) 152/92 (!) 143/81 138/87 137/89  Pulse: 83 80 85 81  Resp: 19 (!) 26 (!) 26 15  Temp:      TempSrc:      SpO2: 100% 99% 100% 98%  Weight:       Eyes: PERRL, lids and conjunctivae normal ENMT: Mucous membranes are moist. Posterior pharynx clear of any exudate or lesions.Normal dentition.  Neck: normal, supple, no masses, no thyromegaly Respiratory: clear to auscultation bilaterally, no wheezing, no crackles. Normal respiratory effort. No accessory muscle use.  Cardiovascular: Regular rate and rhythm, no murmurs / rubs / gallops. No extremity edema. 2+ pedal pulses. No carotid bruits.  Abdomen: no  tenderness, no masses palpated. No hepatosplenomegaly. Bowel sounds positive.  Musculoskeletal: no clubbing / cyanosis. No joint deformity upper and lower extremities. Good ROM, no contractures. Normal muscle tone.  Skin: no rashes, lesions, ulcers. No induration Neurologic: CN 2-12 grossly intact. Sensation intact, DTR normal. Strength 5/5 in all 4.  Psychiatric: Normal judgment and insight. Alert and oriented x 3. Normal mood.     Labs on Admission: I have personally reviewed following labs and imaging studies  CBC: Recent Labs  Lab 09/06/17 1943  WBC 7.2  NEUTROABS 4.7  HGB 14.2  HCT 43.2  MCV 93.7  PLT 462   Basic Metabolic Panel: Recent Labs  Lab 09/06/17 1943  NA 144  K 4.5  CL 111  CO2 23  GLUCOSE 124*  BUN 37*  CREATININE 1.71*  CALCIUM 10.5*   GFR: Estimated Creatinine Clearance: 35.6 mL/min (A) (by C-G formula based on SCr of 1.71 mg/dL (H)). Liver Function Tests: Recent Labs  Lab 09/06/17 1943  AST 23  ALT 28  ALKPHOS 58  BILITOT 1.1  PROT 7.2  ALBUMIN 4.5   No results for input(s): LIPASE, AMYLASE in the last 168 hours. No results for input(s): AMMONIA in the last 168 hours. Coagulation Profile: No results for  input(s): INR, PROTIME in the last 168 hours. Cardiac Enzymes: No results for input(s): CKTOTAL, CKMB, CKMBINDEX, TROPONINI in the last 168 hours. BNP (last 3 results) No results for input(s): PROBNP in the last 8760 hours. HbA1C: No results for input(s): HGBA1C in the last 72 hours. CBG: No results for input(s): GLUCAP in the last 168 hours. Lipid Profile: No results for input(s): CHOL, HDL, LDLCALC, TRIG, CHOLHDL, LDLDIRECT in the last 72 hours. Thyroid Function Tests: No results for input(s): TSH, T4TOTAL, FREET4, T3FREE, THYROIDAB in the last 72 hours. Anemia Panel: No results for input(s): VITAMINB12, FOLATE, FERRITIN, TIBC, IRON, RETICCTPCT in the last 72 hours. Urine analysis:    Component Value Date/Time   COLORURINE YELLOW 07/17/2014 1021   APPEARANCEUR CLEAR 07/17/2014 1021   LABSPEC 1.018 07/17/2014 1021   PHURINE 5.0 07/17/2014 1021   GLUCOSEU 100 (A) 07/17/2014 1021   HGBUR NEGATIVE 07/17/2014 1021   BILIRUBINUR NEGATIVE 07/17/2014 1021   KETONESUR NEGATIVE 07/17/2014 1021   PROTEINUR NEGATIVE 07/17/2014 1021   UROBILINOGEN 0.2 07/17/2014 1021   NITRITE NEGATIVE 07/17/2014 1021   LEUKOCYTESUR NEGATIVE 07/17/2014 1021   Sepsis Labs: No results found for this or any previous visit (from the past 240 hour(s)).   Radiological Exams on Admission: Ct Angio Head W Or Wo Contrast  Result Date: 09/06/2017 CLINICAL DATA:  Follow up code stroke. History of RIGHT MCA infarct. EXAM: CT ANGIOGRAPHY HEAD AND NECK CT PERFUSION BRAIN TECHNIQUE: Multidetector CT imaging of the head and neck was performed using the standard protocol during bolus administration of intravenous contrast. Multiplanar CT image reconstructions and MIPs were obtained to evaluate the vascular anatomy. Carotid stenosis measurements (when applicable) are obtained utilizing NASCET criteria, using the distal internal carotid diameter as the denominator. Multiphase CT imaging of the brain was performed following IV  bolus contrast injection. Subsequent parametric perfusion maps were calculated using RAPID software. CONTRAST:  90 cc ISOVUE-370 IOPAMIDOL (ISOVUE-370) INJECTION 76% COMPARISON:  CT HEAD September 06, 2017 and MRA head May 20, 2014. FINDINGS: CTA NECK FINDINGS AORTIC ARCH: Normal appearance of the thoracic arch, 2 vessel arch is a normal variant. Mild intimal thickening aortic arch and arch vessel origins. The origins of the innominate, left Common carotid artery and subclavian  artery are widely patent. RIGHT CAROTID SYSTEM: Common carotid artery is widely patent, coursing in a straight line fashion. Normal appearance of the carotid bifurcation without hemodynamically significant stenosis by NASCET criteria. Normal appearance of the included internal carotid artery. LEFT CAROTID SYSTEM: Common carotid artery is widely patent, coursing in a straight line fashion. Normal appearance of the carotid bifurcation without hemodynamically significant stenosis by NASCET criteria. Normal appearance of the included internal carotid artery. VERTEBRAL ARTERIES:Codominant vertebral arteries. Normal appearance of the vertebral arteries, which appear widely patent. SKELETON: No acute osseous process though bone windows have not been submitted. OTHER NECK: Soft tissues of the neck are nonacute though, not tailored for evaluation. UPPER CHEST: Included lung apices are clear. No superior mediastinal lymphadenopathy. CTA HEAD FINDINGS ANTERIOR CIRCULATION: Patent cervical internal carotid arteries, petrous, cavernous and supra clinoid internal carotid arteries. 3 mm wide neck inferiorly directed LEFT PCOM origin aneurysm. Patent anterior communicating artery. Patent anterior and middle cerebral arteries, multifocal moderate stenoses progressed from prior MRA. No large vessel occlusion, significant stenosis, contrast extravasation. POSTERIOR CIRCULATION: Patent vertebral arteries, vertebrobasilar junction and basilar artery, as well as main  branch vessels. Patent posterior cerebral arteries, multifocal moderate stenosis. Moderate to severe stenosis distal RIGHT P2 segment new from prior MRA. No large vessel occlusion, contrast extravasation or aneurysm. VENOUS SINUSES: Major dural venous sinuses are patent though not tailored for evaluation on this angiographic examination. ANATOMIC VARIANTS: Hypoplastic RIGHT A1 segment. Fetal origin RIGHT posterior cerebral artery. DELAYED PHASE: Not performed. MIP images reviewed. CT Brain Perfusion Findings: CBF (<30%) Volume: 56mL Perfusion (Tmax>6.0s) volume: 11mL Mismatch Volume: 45mL Infarction Location:RIGHT mesial occipital lobe. IMPRESSION: CTA NECK: 1. No hemodynamically significant stenosis ICA. Patent vertebral arteries. CTA HEAD: 1.  No emergent large vessel occlusion. 2. Moderate to severe stenosis distal RIGHT P2 segment (fetal origin). Multifocal cerebral artery moderate stenosis compatible with atherosclerosis. 3. Intact 3 mm LEFT PCOM aneurysm. Neuro-Interventional Radiology consultation is suggested to evaluate the appropriateness of potential treatment. Non-emergent evaluation can be arranged by calling [phone] during usual hours. CT PERFUSION: 1. Small area mesial RIGHT occipital lobe/PCA territory brain at risk. Acute findings discussed with and reconfirmed by Dr.ASHISH ARORA on 09/06/2017 at 5:45 pm. Electronically Signed   By: Elon Alas M.D.   On: 09/06/2017 17:55   Ct Angio Neck W Or Wo Contrast  Result Date: 09/06/2017 CLINICAL DATA:  Follow up code stroke. History of RIGHT MCA infarct. EXAM: CT ANGIOGRAPHY HEAD AND NECK CT PERFUSION BRAIN TECHNIQUE: Multidetector CT imaging of the head and neck was performed using the standard protocol during bolus administration of intravenous contrast. Multiplanar CT image reconstructions and MIPs were obtained to evaluate the vascular anatomy. Carotid stenosis measurements (when applicable) are obtained utilizing NASCET criteria, using the  distal internal carotid diameter as the denominator. Multiphase CT imaging of the brain was performed following IV bolus contrast injection. Subsequent parametric perfusion maps were calculated using RAPID software. CONTRAST:  90 cc ISOVUE-370 IOPAMIDOL (ISOVUE-370) INJECTION 76% COMPARISON:  CT HEAD September 06, 2017 and MRA head May 20, 2014. FINDINGS: CTA NECK FINDINGS AORTIC ARCH: Normal appearance of the thoracic arch, 2 vessel arch is a normal variant. Mild intimal thickening aortic arch and arch vessel origins. The origins of the innominate, left Common carotid artery and subclavian artery are widely patent. RIGHT CAROTID SYSTEM: Common carotid artery is widely patent, coursing in a straight line fashion. Normal appearance of the carotid bifurcation without hemodynamically significant stenosis by NASCET criteria. Normal appearance of the included internal  carotid artery. LEFT CAROTID SYSTEM: Common carotid artery is widely patent, coursing in a straight line fashion. Normal appearance of the carotid bifurcation without hemodynamically significant stenosis by NASCET criteria. Normal appearance of the included internal carotid artery. VERTEBRAL ARTERIES:Codominant vertebral arteries. Normal appearance of the vertebral arteries, which appear widely patent. SKELETON: No acute osseous process though bone windows have not been submitted. OTHER NECK: Soft tissues of the neck are nonacute though, not tailored for evaluation. UPPER CHEST: Included lung apices are clear. No superior mediastinal lymphadenopathy. CTA HEAD FINDINGS ANTERIOR CIRCULATION: Patent cervical internal carotid arteries, petrous, cavernous and supra clinoid internal carotid arteries. 3 mm wide neck inferiorly directed LEFT PCOM origin aneurysm. Patent anterior communicating artery. Patent anterior and middle cerebral arteries, multifocal moderate stenoses progressed from prior MRA. No large vessel occlusion, significant stenosis, contrast  extravasation. POSTERIOR CIRCULATION: Patent vertebral arteries, vertebrobasilar junction and basilar artery, as well as main branch vessels. Patent posterior cerebral arteries, multifocal moderate stenosis. Moderate to severe stenosis distal RIGHT P2 segment new from prior MRA. No large vessel occlusion, contrast extravasation or aneurysm. VENOUS SINUSES: Major dural venous sinuses are patent though not tailored for evaluation on this angiographic examination. ANATOMIC VARIANTS: Hypoplastic RIGHT A1 segment. Fetal origin RIGHT posterior cerebral artery. DELAYED PHASE: Not performed. MIP images reviewed. CT Brain Perfusion Findings: CBF (<30%) Volume: 74mL Perfusion (Tmax>6.0s) volume: 31mL Mismatch Volume: 58mL Infarction Location:RIGHT mesial occipital lobe. IMPRESSION: CTA NECK: 1. No hemodynamically significant stenosis ICA. Patent vertebral arteries. CTA HEAD: 1.  No emergent large vessel occlusion. 2. Moderate to severe stenosis distal RIGHT P2 segment (fetal origin). Multifocal cerebral artery moderate stenosis compatible with atherosclerosis. 3. Intact 3 mm LEFT PCOM aneurysm. Neuro-Interventional Radiology consultation is suggested to evaluate the appropriateness of potential treatment. Non-emergent evaluation can be arranged by calling [phone] during usual hours. CT PERFUSION: 1. Small area mesial RIGHT occipital lobe/PCA territory brain at risk. Acute findings discussed with and reconfirmed by Dr.ASHISH ARORA on 09/06/2017 at 5:45 pm. Electronically Signed   By: Elon Alas M.D.   On: 09/06/2017 17:55   Ct Cerebral Perfusion W Contrast  Result Date: 09/06/2017 CLINICAL DATA:  Follow up code stroke. History of RIGHT MCA infarct. EXAM: CT ANGIOGRAPHY HEAD AND NECK CT PERFUSION BRAIN TECHNIQUE: Multidetector CT imaging of the head and neck was performed using the standard protocol during bolus administration of intravenous contrast. Multiplanar CT image reconstructions and MIPs were obtained to  evaluate the vascular anatomy. Carotid stenosis measurements (when applicable) are obtained utilizing NASCET criteria, using the distal internal carotid diameter as the denominator. Multiphase CT imaging of the brain was performed following IV bolus contrast injection. Subsequent parametric perfusion maps were calculated using RAPID software. CONTRAST:  90 cc ISOVUE-370 IOPAMIDOL (ISOVUE-370) INJECTION 76% COMPARISON:  CT HEAD September 06, 2017 and MRA head May 20, 2014. FINDINGS: CTA NECK FINDINGS AORTIC ARCH: Normal appearance of the thoracic arch, 2 vessel arch is a normal variant. Mild intimal thickening aortic arch and arch vessel origins. The origins of the innominate, left Common carotid artery and subclavian artery are widely patent. RIGHT CAROTID SYSTEM: Common carotid artery is widely patent, coursing in a straight line fashion. Normal appearance of the carotid bifurcation without hemodynamically significant stenosis by NASCET criteria. Normal appearance of the included internal carotid artery. LEFT CAROTID SYSTEM: Common carotid artery is widely patent, coursing in a straight line fashion. Normal appearance of the carotid bifurcation without hemodynamically significant stenosis by NASCET criteria. Normal appearance of the included internal carotid artery. VERTEBRAL ARTERIES:Codominant  vertebral arteries. Normal appearance of the vertebral arteries, which appear widely patent. SKELETON: No acute osseous process though bone windows have not been submitted. OTHER NECK: Soft tissues of the neck are nonacute though, not tailored for evaluation. UPPER CHEST: Included lung apices are clear. No superior mediastinal lymphadenopathy. CTA HEAD FINDINGS ANTERIOR CIRCULATION: Patent cervical internal carotid arteries, petrous, cavernous and supra clinoid internal carotid arteries. 3 mm wide neck inferiorly directed LEFT PCOM origin aneurysm. Patent anterior communicating artery. Patent anterior and middle cerebral  arteries, multifocal moderate stenoses progressed from prior MRA. No large vessel occlusion, significant stenosis, contrast extravasation. POSTERIOR CIRCULATION: Patent vertebral arteries, vertebrobasilar junction and basilar artery, as well as main branch vessels. Patent posterior cerebral arteries, multifocal moderate stenosis. Moderate to severe stenosis distal RIGHT P2 segment new from prior MRA. No large vessel occlusion, contrast extravasation or aneurysm. VENOUS SINUSES: Major dural venous sinuses are patent though not tailored for evaluation on this angiographic examination. ANATOMIC VARIANTS: Hypoplastic RIGHT A1 segment. Fetal origin RIGHT posterior cerebral artery. DELAYED PHASE: Not performed. MIP images reviewed. CT Brain Perfusion Findings: CBF (<30%) Volume: 23mL Perfusion (Tmax>6.0s) volume: 66mL Mismatch Volume: 48mL Infarction Location:RIGHT mesial occipital lobe. IMPRESSION: CTA NECK: 1. No hemodynamically significant stenosis ICA. Patent vertebral arteries. CTA HEAD: 1.  No emergent large vessel occlusion. 2. Moderate to severe stenosis distal RIGHT P2 segment (fetal origin). Multifocal cerebral artery moderate stenosis compatible with atherosclerosis. 3. Intact 3 mm LEFT PCOM aneurysm. Neuro-Interventional Radiology consultation is suggested to evaluate the appropriateness of potential treatment. Non-emergent evaluation can be arranged by calling [phone] during usual hours. CT PERFUSION: 1. Small area mesial RIGHT occipital lobe/PCA territory brain at risk. Acute findings discussed with and reconfirmed by Dr.ASHISH ARORA on 09/06/2017 at 5:45 pm. Electronically Signed   By: Elon Alas M.D.   On: 09/06/2017 17:55   Ct Head Code Stroke Wo Contrast  Result Date: 09/06/2017 CLINICAL DATA:  Code stroke. Code stroke, LEFT gaze preference. Last seen normal at 1200 hours. EXAM: CT HEAD WITHOUT CONTRAST TECHNIQUE: Contiguous axial images were obtained from the base of the skull through the  vertex without intravenous contrast. COMPARISON:  MRI head May 20, 2014 FINDINGS: BRAIN: No intraparenchymal hemorrhage, mass effect nor midline shift. Small area RIGHT frontal lobe encephalomalacia at convexity. Confluent supratentorial white matter hypodensities. Tiny basal ganglia and thalami hypodensities associated with chronic small vessel ischemic changes. Old bilateral small cerebellar infarcts. No acute large vascular territory infarcts. Mild parenchymal brain volume loss. No hydrocephalus. No abnormal extra-axial fluid collections. Basal cisterns are patent. VASCULAR: Mild calcific atherosclerosis of the carotid siphons. SKULL: No skull fracture. No significant scalp soft tissue swelling. SINUSES/ORBITS: The mastoid air-cells and included paranasal sinuses are well-aerated.The included ocular globes and orbital contents are non-suspicious. Status post bilateral ocular lens implants. OTHER: None. ASPECTS [facility] Stroke Program Early CT Score) - Ganglionic level infarction (caudate, lentiform nuclei, internal capsule, insula, M1-M3 cortex): 7 - Supraganglionic infarction (M4-M6 cortex): 3 Total score (0-10 with 10 being normal): 10 IMPRESSION: 1. No acute intracranial process. 2. ASPECTS is 10. 3. Old small RIGHT frontal/MCA territory infarct. 4. Moderate to severe chronic small vessel ischemic disease. Old small cerebellar infarcts. 5. Critical Value/emergent results text paged to [place] via [employer] secure system on 09/06/2017 at 5:20 pm, including interpreting physician's phone number. Electronically Signed   By: Elon Alas M.D.   On: 09/06/2017 17:25    EKG: Independently reviewed.  Sinus rhythm 81 bpm  Assessment/Plan Vision loss, CVA: Acute.  Patient presents  with reports of vision loss in his peripheral field out of his left eye.   Studies show marked stenosis of the distal right P2 segment.  Neurology consulted rec pe and suspecting acute stroke.  Note patient with previous  history of SVT - Admit to telemetry bed  - Stroke order set initiated - Neuro checks - Check  MRI brain w/o contrast - PT/OT/Speech to eval and treat - Check echocardiogram - Check Hemoglobin A1c and lipid panel in a.m. - Allow for permissive hypertension - ASA - Atorvastatin 80 mg p.o. daily - Follow-up telemetry overnight - Appreciate neurology consultative services, will follow-up for further recommendation  Acute kidney injury superimposed on chronic kidney disease stage III: Patient's baseline creatinine previously noted to be around 1.3, but presents with a creatinine of 1.71 with BUN elevated to 37.  Suspect dehydration given history and increased BUN to creatinine ratio. - Normal saline IV fluids at 100 mL/h - Recheck kidney function in a.m.  History of CVA with residual balance deficit  Essential hypertension - Allowing for permissive hypertension for 24 to 48 hours - Hold lisinopril and amlodipine and restart when medically appropriate  Hypercalcemia,  hyperparathyroidism: Patient since with mild elevation in calcium levels to 10.5.  Followed in the outpatient setting by Dr. Cruzita Lederer of endocrinology. - IV fluids as seen above - Follow-up repeat calcium levels in a.m.  Diabetes mellitus type 2 - Hypoglycemic protocols  - Hold glipizide and metformin - CBGs q. before meals and at bedtime with sensitive SSI  Hyperlipidemia - Change from simvastatin to atorvastatin 80 mg  DVT prophylaxis: lovenox   Code Status: full  Family Communication: Discussed plan of care with the patient family present at bedside Disposition Plan: Likely discharge home in 1 to 2 days Consults called: Neurology Admission status: Observation  Norval Morton MD [facility] Pager [phone]   If 7PM-7AM, please contact night-coverage [email or web address] Password [facility]  09/06/2017, 8:39 PM

## 2017-09-06 NOTE — ED Triage Notes (Signed)
Pt came in POV after being seen for loss of vision in left eye at ophthalmologist. Pt states he was out working in his barn and said he got hot and noticed his vision got blurry in left eye and couldn't see in peripheral. Pt AO x 4 at arrival. Eye Dr, notes say vision likely to stroke. Pt ambulatory to stretcher. BP 130/82 at eye DR.

## 2017-09-07 ENCOUNTER — Observation Stay (HOSPITAL_BASED_OUTPATIENT_CLINIC_OR_DEPARTMENT_OTHER): Payer: Medicare HMO

## 2017-09-07 ENCOUNTER — Observation Stay (HOSPITAL_COMMUNITY): Payer: Medicare HMO

## 2017-09-07 DIAGNOSIS — I10 Essential (primary) hypertension: Secondary | ICD-10-CM | POA: Diagnosis not present

## 2017-09-07 DIAGNOSIS — I361 Nonrheumatic tricuspid (valve) insufficiency: Secondary | ICD-10-CM | POA: Diagnosis not present

## 2017-09-07 DIAGNOSIS — I63431 Cerebral infarction due to embolism of right posterior cerebral artery: Secondary | ICD-10-CM

## 2017-09-07 DIAGNOSIS — I639 Cerebral infarction, unspecified: Secondary | ICD-10-CM | POA: Diagnosis not present

## 2017-09-07 DIAGNOSIS — H5340 Unspecified visual field defects: Secondary | ICD-10-CM | POA: Diagnosis not present

## 2017-09-07 DIAGNOSIS — N179 Acute kidney failure, unspecified: Secondary | ICD-10-CM | POA: Diagnosis not present

## 2017-09-07 DIAGNOSIS — E860 Dehydration: Secondary | ICD-10-CM | POA: Diagnosis not present

## 2017-09-07 DIAGNOSIS — E1121 Type 2 diabetes mellitus with diabetic nephropathy: Secondary | ICD-10-CM | POA: Diagnosis not present

## 2017-09-07 LAB — ECHOCARDIOGRAM COMPLETE
Height: 68 in
Weight: 2592.61 oz

## 2017-09-07 LAB — LIPID PANEL
CHOLESTEROL: 129 mg/dL (ref 0–200)
HDL: 35 mg/dL — AB (ref >40)
LDL Cholesterol: 68 mg/dL (ref 0–99)
TRIGLYCERIDES: 128 mg/dL (ref <150)
Total CHOL/HDL Ratio: 3.7 RATIO
VLDL: 26 mg/dL (ref 0–40)

## 2017-09-07 LAB — CBC
HEMATOCRIT: 36.4 % — AB (ref 39.0–52.0)
Hemoglobin: 12.4 g/dL — ABNORMAL LOW (ref 13.0–17.0)
MCH: 31.1 pg (ref 26.0–34.0)
MCHC: 34.1 g/dL (ref 30.0–36.0)
MCV: 91.2 fL (ref 78.0–100.0)
Platelets: 198 10*3/uL (ref 150–400)
RBC: 3.99 MIL/uL — ABNORMAL LOW (ref 4.22–5.81)
RDW: 12.8 % (ref 11.5–15.5)
WBC: 5.4 10*3/uL (ref 4.0–10.5)

## 2017-09-07 LAB — POCT I-STAT, CHEM 8
BUN: 45 mg/dL — ABNORMAL HIGH (ref 8–23)
CHLORIDE: 111 mmol/L (ref 98–111)
Calcium, Ion: 1.32 mmol/L (ref 1.15–1.40)
Creatinine, Ser: 1.7 mg/dL — ABNORMAL HIGH (ref 0.61–1.24)
GLUCOSE: 129 mg/dL — AB (ref 70–99)
HEMATOCRIT: 40 % (ref 39.0–52.0)
Hemoglobin: 13.6 g/dL (ref 13.0–17.0)
POTASSIUM: 4.5 mmol/L (ref 3.5–5.1)
Sodium: 143 mmol/L (ref 135–145)
TCO2: 25 mmol/L (ref 22–32)

## 2017-09-07 LAB — HEMOGLOBIN A1C
HEMOGLOBIN A1C: 7.2 % — AB (ref 4.8–5.6)
MEAN PLASMA GLUCOSE: 159.94 mg/dL

## 2017-09-07 LAB — RAPID URINE DRUG SCREEN, HOSP PERFORMED
Amphetamines: NOT DETECTED
BENZODIAZEPINES: NOT DETECTED
COCAINE: NOT DETECTED
Opiates: NOT DETECTED
Tetrahydrocannabinol: NOT DETECTED

## 2017-09-07 LAB — BASIC METABOLIC PANEL
ANION GAP: 8 (ref 5–15)
BUN: 30 mg/dL — ABNORMAL HIGH (ref 8–23)
CO2: 23 mmol/L (ref 22–32)
Calcium: 9.5 mg/dL (ref 8.9–10.3)
Chloride: 113 mmol/L — ABNORMAL HIGH (ref 98–111)
Creatinine, Ser: 1.43 mg/dL — ABNORMAL HIGH (ref 0.61–1.24)
GFR calc Af Amer: 53 mL/min — ABNORMAL LOW (ref >60)
GFR, EST NON AFRICAN AMERICAN: 46 mL/min — AB (ref >60)
GLUCOSE: 97 mg/dL (ref 70–99)
Potassium: 4.3 mmol/L (ref 3.5–5.1)
Sodium: 144 mmol/L (ref 135–145)

## 2017-09-07 LAB — GLUCOSE, CAPILLARY
GLUCOSE-CAPILLARY: 96 mg/dL (ref 70–99)
GLUCOSE-CAPILLARY: 198 mg/dL — AB (ref 70–99)
GLUCOSE-CAPILLARY: 97 mg/dL (ref 70–99)
Glucose-Capillary: 269 mg/dL — ABNORMAL HIGH (ref 70–99)

## 2017-09-07 LAB — POCT I-STAT TROPONIN I: Troponin i, poc: 0 ng/mL (ref 0.00–0.08)

## 2017-09-07 MED ORDER — LORAZEPAM 2 MG/ML IJ SOLN
0.5000 mg | Freq: Once | INTRAMUSCULAR | Status: AC | PRN
  Administered 2017-09-07: 0.5 mg via INTRAVENOUS
  Filled 2017-09-07: qty 1

## 2017-09-07 MED ORDER — CLOPIDOGREL BISULFATE 75 MG PO TABS
75.0000 mg | ORAL_TABLET | Freq: Every day | ORAL | Status: DC
  Administered 2017-09-07 – 2017-09-08 (×2): 75 mg via ORAL
  Filled 2017-09-07 (×2): qty 1

## 2017-09-07 NOTE — Plan of Care (Signed)
At this time, Tony Jackson is awaiting a MRI for stroke workup.  He ambulates with guarding assistance, and his strength is largely intact.  The patient still complains of visual deficits, and will work with physical and occupational therapy.  Nursing POC:  Optimize safety with ambulation, OT/PT evaluation, anxiety management and continue stroke workup.  Q4 neuro and NIH Qshift.  The patient's wife is at bedside.

## 2017-09-07 NOTE — Progress Notes (Signed)
SLP Cancellation Note  Patient Details Name: Tony Jackson. MRN: [id number] DOB: 01-16-1941   Cancelled treatment:       Reason Eval/Treat Not Completed: SLP screened, no needs identified, will sign off   Loetta Connelley 09/07/2017, 1:18 PM

## 2017-09-07 NOTE — Evaluation (Signed)
Physical Therapy Evaluation Patient Details Name: Tony Jackson. MRN: [id number] DOB: March 31, 1940 Today's Date: 09/07/2017   History of Present Illness  Patient is a 77 y/o male presenting with peripheral vision loss of L eye. Noncontrast CT of the head that did not show any bleed.  It showed an old right MCA stroke and small vessel disease. CT perfusion showed a small 6 cc area of right occipital lobe perfusion deficit. PMH significant for HTN, HLD, CKD stage III, CVA with residual balance issues, hyperparathyroidism.    Clinical Impression  Mr. Brisbin is a very pleasant 77 y/o male admitted with the above listed diagnosis. Patient reporting that prior to admission he was independent with all aspects of mobility. Patient today requiring Min guard for mobility for general safety and balance concerns. No LOB with gait, but slight unsteadiness with head turns, turning, and stepping over objects. PT to continue to follow acutely to maximize safe functional mobility prior to d/c. Education today on BE FAST with patient and wife with good verbal understanding.     Follow Up Recommendations No PT follow up    Equipment Recommendations  None recommended by PT    Recommendations for Other Services       Precautions / Restrictions Precautions Precautions: Fall Restrictions Weight Bearing Restrictions: No      Mobility  Bed Mobility Overal bed mobility: Modified Independent             General bed mobility comments: sitting EOB upon PT arrival  Transfers Overall transfer level: Needs assistance Equipment used: None Transfers: Sit to/from Stand Sit to Stand: Supervision         General transfer comment: for general safety and immediate standing balance  Ambulation/Gait Ambulation/Gait assistance: Min guard;Supervision Gait Distance (Feet): 150 Feet Assistive device: None Gait Pattern/deviations: Step-through pattern;Decreased stride length;Drifts right/left;Narrow base of  support Gait velocity: decreased   General Gait Details: Min guard to Sup for general safety; does have mild balance deficits, but reports reduced balance at baseline. Difficulty with gait wiht head turns and turning around  Stairs Stairs: Yes Stairs assistance: Min guard;Supervision Stair Management: Two rails;Alternating pattern;Forwards Number of Stairs: 3    Wheelchair Mobility    Modified Rankin (Stroke Patients Only) Modified Rankin (Stroke Patients Only) Pre-Morbid Rankin Score: No symptoms Modified Rankin: Moderate disability     Balance Overall balance assessment: Needs assistance Sitting-balance support: No upper extremity supported;Feet supported Sitting balance-Leahy Scale: Good     Standing balance support: No upper extremity supported;During functional activity Standing balance-Leahy Scale: Good                   Standardized Balance Assessment Standardized Balance Assessment : Dynamic Gait Index   Dynamic Gait Index Level Surface: Mild Impairment Change in Gait Speed: Mild Impairment Gait with Horizontal Head Turns: Moderate Impairment Gait with Vertical Head Turns: Moderate Impairment Gait and Pivot Turn: Mild Impairment Step Over Obstacle: Mild Impairment Step Around Obstacles: Mild Impairment Steps: Mild Impairment Total Score: 14       Pertinent Vitals/Pain Pain Assessment: No/denies pain    Home Living Family/patient expects to be discharged to:: Private residence Living Arrangements: Spouse/significant other Available Help at Discharge: Family Type of Home: House Home Access: Stairs to enter Entrance Stairs-Rails: Can reach both Entrance Stairs-Number of Steps: 4 Home Layout: Two level Home Equipment: None      Prior Function Level of Independence: Independent         Comments: retired in January; does  yard-work     Hand Dominance        Extremity/Trunk Assessment   Upper Extremity Assessment Upper Extremity  Assessment: Defer to OT evaluation    Lower Extremity Assessment Lower Extremity Assessment: Generalized weakness    Cervical / Trunk Assessment Cervical / Trunk Assessment: Normal  Communication   Communication: No difficulties  Cognition Arousal/Alertness: Awake/alert Behavior During Therapy: WFL for tasks assessed/performed Overall Cognitive Status: Within Functional Limits for tasks assessed                                        General Comments      Exercises     Assessment/Plan    PT Assessment Patient needs continued PT services  PT Problem List Decreased strength;Decreased activity tolerance;Decreased balance;Decreased mobility;Decreased coordination;Decreased safety awareness;Decreased knowledge of precautions       PT Treatment Interventions DME instruction;Gait training;Stair training;Functional mobility training;Therapeutic activities;Therapeutic exercise;Balance training;Neuromuscular re-education;Patient/family education    PT Goals (Current goals can be found in the Care Plan section)  Acute Rehab PT Goals Patient Stated Goal: return home PT Goal Formulation: With patient Time For Goal Achievement: 09/14/17 Potential to Achieve Goals: Good    Frequency Min 4X/week   Barriers to discharge        Co-evaluation               AM-PAC PT "6 Clicks" Daily Activity  Outcome Measure Difficulty turning over in bed (including adjusting bedclothes, sheets and blankets)?: A Little Difficulty moving from lying on back to sitting on the side of the bed? : A Little Difficulty sitting down on and standing up from a chair with arms (e.g., wheelchair, bedside commode, etc,.)?: Unable Help needed moving to and from a bed to chair (including a wheelchair)?: A Little Help needed walking in hospital room?: A Little Help needed climbing 3-5 steps with a railing? : A Little 6 Click Score: 16    End of Session Equipment Utilized During Treatment:  Gait belt Activity Tolerance: Patient tolerated treatment well Patient left: in bed;with call bell/phone within reach;with family/visitor present Nurse Communication: Mobility status PT Visit Diagnosis: Unsteadiness on feet (R26.81);Other abnormalities of gait and mobility (R26.89)    Time: 2993-7169 PT Time Calculation (min) (ACUTE ONLY): 20 min   Charges:   PT Evaluation $PT Eval Moderate Complexity: 1 Mod     PT G Codes:        Lanney Gins, PT, DPT 09/07/17 9:45 AM Pager: [phone]

## 2017-09-07 NOTE — Progress Notes (Signed)
  Echocardiogram 2D Echocardiogram has been performed.  Tony Jackson 09/07/2017, 2:26 PM

## 2017-09-07 NOTE — Progress Notes (Signed)
OT Cancellation Note and Discharge  Patient Details Name: Tony Jackson. MRN: [id number] DOB: [birth year]   Cancelled Treatment:    Reason Eval/Treat Not Completed: OT screened, no needs identified, will sign off. Spoke with PT, and then went in and did brief visual/functional screen with Pt and wife. Pt reports that his visual deficits have resolved. Pt and iwfe with no questions or concerns at the end of session, OT to sign off. Thank you for the opportunity to serve this lovely patient.   [place] 09/07/2017, 12:20 PM  Hulda Humphrey OTR/L [phone]

## 2017-09-07 NOTE — Progress Notes (Signed)
STROKE TEAM PROGRESS NOTE   SUBJECTIVE (INTERVAL HISTORY) His wife is at the bedside.  Pt stated that his vision improved. Loop recorder placed in 04/2014 and likely now expired. MRI showed right PCA infarct.   OBJECTIVE Temp:  [97.5 F (36.4 C)-99.3 F (37.4 C)] 98.3 F (36.8 C) (07/19 0552) Pulse Rate:  [60-85] 62 (07/19 0552) Cardiac Rhythm: Normal sinus rhythm (07/19 0000) Resp:  [15-26] 20 (07/19 0552) BP: (124-163)/(77-96) 131/77 (07/19 0552) SpO2:  [97 %-100 %] 99 % (07/19 0552) Weight:  [162 lb 0.6 oz (73.5 kg)-168 lb (76.2 kg)] 162 lb 0.6 oz (73.5 kg) (07/18 2155)  CBC:  Recent Labs  Lab 09/06/17 1943 09/07/17 0353  WBC 7.2 5.4  NEUTROABS 4.7  --   HGB 14.2 12.4*  HCT 43.2 36.4*  MCV 93.7 91.2  PLT 234 619    Basic Metabolic Panel:  Recent Labs  Lab 09/06/17 1943 09/07/17 0353  NA 144 144  K 4.5 4.3  CL 111 113*  CO2 23 23  GLUCOSE 124* 97  BUN 37* 30*  CREATININE 1.71* 1.43*  CALCIUM 10.5* 9.5    Lipid Panel:     Component Value Date/Time   CHOL 129 09/07/2017 0353   TRIG 128 09/07/2017 0353   HDL 35 (L) 09/07/2017 0353   CHOLHDL 3.7 09/07/2017 0353   VLDL 26 09/07/2017 0353   LDLCALC 68 09/07/2017 0353   HgbA1c:  Lab Results  Component Value Date   HGBA1C 7.2 (H) 09/07/2017   Urine Drug Screen:     Component Value Date/Time   LABOPIA NONE DETECTED 05/19/2014 2011   COCAINSCRNUR NONE DETECTED 05/19/2014 2011   LABBENZ NONE DETECTED 05/19/2014 2011   AMPHETMU NONE DETECTED 05/19/2014 2011   THCU NONE DETECTED 05/19/2014 2011   LABBARB NONE DETECTED 05/19/2014 2011    Alcohol Level     Component Value Date/Time   ETH <5 05/19/2014 1958    IMAGING I have personally reviewed the radiological images below and agree with the radiology interpretations.  Ct Angio Head W Or Wo Contrast 09/06/2017 IMPRESSION:   CTA NECK:  1. No hemodynamically significant stenosis ICA. Patent vertebral arteries.   CTA HEAD:  1.  No emergent large  vessel occlusion.  2. Moderate to severe stenosis distal RIGHT P2 segment (fetal origin). Multifocal cerebral artery moderate stenosis compatible with atherosclerosis.  3. Intact 3 mm LEFT PCOM aneurysm. Neuro-Interventional Radiology consultation is suggested to evaluate the appropriateness of potential treatment. Non-emergent evaluation can be arranged by calling [phone] during usual hours.   CT PERFUSION:  1. Small area mesial RIGHT occipital lobe/PCA territory brain at risk.   Ct Head Code Stroke Wo Contrast 09/06/2017 IMPRESSION:  1. No acute intracranial process.  2. ASPECTS is 10.  3. Old small RIGHT frontal/MCA territory infarct.  4. Moderate to severe chronic small vessel ischemic disease. Old small cerebellar infarcts.   Mr Brain Wo Contrast  Result Date: 09/07/2017 CLINICAL DATA:  Acute left eye visual loss.  Prior stroke. EXAM: MRI HEAD WITHOUT CONTRAST TECHNIQUE: Multiplanar, multiecho pulse sequences of the brain and surrounding structures were obtained without intravenous contrast. COMPARISON:  Head CT/CTA/perfusion 09/06/2017 and MRI 05/20/2014 FINDINGS: Brain: There is a small region of confluent gyral restricted diffusion in the medial right occipital lobe consistent with acute infarct. There is also a punctate acute infarct in the right lateral splenium of the corpus callosum. No intracranial hemorrhage, mass, midline shift, or extra-axial fluid collection is identified. Small chronic infarcts are again noted in  the right frontal lobe (MCA territory) and both cerebellar hemispheres. Patchy to confluent T2 hyperintensities in the cerebral white matter bilaterally are nonspecific but compatible with moderate chronic small vessel ischemic disease, similar to the prior MRI. Mild chronic small vessel changes are noted in the pons. There is mild cerebral atrophy. Vascular: Major intracranial arterial flow voids are preserved. Chronically asymmetric appearance of the left transverse  and sigmoid sinus flow voids likely reflects slow flow. Skull and upper cervical spine: Unremarkable bone marrow signal. Sinuses/Orbits: Bilateral cataract extraction. Paranasal sinuses and mastoid air cells are clear. Other: None. IMPRESSION: 1. Small acute right PCA infarct. 2. Moderate chronic small vessel ischemic disease. 3. Chronic right frontal and bilateral cerebellar infarcts. Electronically Signed   By: Logan Bores M.D.   On: 09/07/2017 16:51   Transthoracic Echocardiogram - Left ventricle: The cavity size was normal. There was moderate   concentric hypertrophy. Systolic function was normal. The   estimated ejection fraction was in the range of 60% to 65%. Wall   motion was normal; there were no regional wall motion   abnormalities. Doppler parameters are consistent with abnormal   left ventricular relaxation (grade 1 diastolic dysfunction).   Doppler parameters are consistent with elevated ventricular   end-diastolic filling pressure. - Aortic valve: Trileaflet; normal thickness leaflets. There was no   regurgitation. - Mitral valve: There was mild regurgitation. - Right ventricle: Systolic function was normal. - Right atrium: The atrium was normal in size. - Tricuspid valve: There was mild regurgitation. - Pulmonary arteries: Systolic pressure was within the normal   range. - Inferior vena cava: The vessel was normal in size. - Pericardium, extracardiac: There was no pericardial effusion. Impressions: - No cardiac source of emboli was indentified.   PHYSICAL EXAM  Temp:  [97.5 F (36.4 C)-99.3 F (37.4 C)] 98.4 F (36.9 C) (07/19 2045) Pulse Rate:  [62-69] 69 (07/19 2045) Resp:  [18-22] 18 (07/19 2045) BP: (124-159)/(77-96) 134/84 (07/19 2045) SpO2:  [96 %-100 %] 96 % (07/19 2045) Weight:  [162 lb 0.6 oz (73.5 kg)] 162 lb 0.6 oz (73.5 kg) (07/18 2155)  General - Well nourished, well developed, in no apparent distress.  Ophthalmologic - fundi not visualized due to  noncooperation.  Cardiovascular - Regular rate and rhythm with no murmur.  Mental Status -  Level of arousal and orientation to time, place, and person were intact. Language including expression, naming, repetition, comprehension was assessed and found intact. Fund of Knowledge was assessed and was intact.  Cranial Nerves II - XII - II - Visual field intact OU. III, IV, VI - Extraocular movements intact. V - Facial sensation intact bilaterally. VII - Facial movement intact bilaterally. VIII - Hearing & vestibular intact bilaterally. X - Palate elevates symmetrically. XI - Chin turning & shoulder shrug intact bilaterally. XII - Tongue protrusion intact.  Motor Strength - The patient's strength was normal in all extremities and pronator drift was absent.  Bulk was normal and fasciculations were absent.   Motor Tone - Muscle tone was assessed at the neck and appendages and was normal.  Reflexes - The patient's reflexes were symmetrical in all extremities and he had no pathological reflexes.  Sensory - Light touch, temperature/pinprick were assessed and were symmetrical.    Coordination - The patient had normal movements in the hands and feet with no ataxia or dysmetria.  Tremor was absent.  Gait and Station - deferred.   ASSESSMENT/PLAN [name]. is a 77 y.o. male  with history of previous stroke, paroxysmal supraventricular tachycardia, known lung nodule, hypertension, hyperlipidemia, and diabetes mellitus  presenting with visual field changes. He did not receive IV t-PA due to late presentation.  Stroke: right PCA patchy infarcts, embolic pattern, concerning for undiagnosed afib.  Resultant  Back to baseline  CT head - Small area mesial RIGHT occipital lobe/PCA territory at risk.  MRI head - right PCA patchy infarct  CTA H&N - Intact 3 mm LEFT PCOM aneurysm. Moderate to severe stenosis distal RIGHT P2 segment   2D Echo - EF 60-65%  Loop recorder afib altered  but false alarm after each review  LDL - 68  HgbA1c - 7.2  VTE prophylaxis - Lovenox  aspirin 81 mg daily prior to admission, now on aspirin 325 mg daily. Further antiplatelet or anticoagulation regimen will be decided by Dr. Leonie Man  Patient counseled to be compliant with his antithrombotic medications  Ongoing aggressive stroke risk factor management  Therapy recommendations:  pending  Disposition:  Pending  Hx of stroke  04/2014 left sided weakness, right ACA infarct on MRI. MRA showed right M2 stenosis. CUS and TTE negative. TEE unremarkable, no PFO, loop placed. A1C 7.3 LDL 78. Put on ASA 325mg   Followed with Dr. Leonie Man  Hx of PSVT s/p ablation   Loop recorder always called for afib but denied after cardiology review  Given recurrent emoblic strokes, with hx of PSVT s/p ablation and frequent afib alert from loop recorder, considering DOAC anticoagulation is reasonable. As pt is Dr. Clydene Fake pt, will ask him to decide in am.   Hypertension  Stable . Long-term BP goal normotensive  Hyperlipidemia  Lipid lowering medication PTA: Zocor 40 mg daily  LDL 68, goal < 70  Current lipid lowering medication: Lipitor 80 mg daily  Continue statin at discharge  Diabetes  HgbA1c 7.2, goal < 7.0  Uncontrolled  CBG  SSI  Other Stroke Risk Factors  Advanced age  Former cigarette smoker - quit  CAD  PSVT s/p ablation  Other Active Problems  Mild renal insufficiency  Kidney stone  Hospital day # 0  Rosalin Hawking, MD PhD Stroke Neurology 09/07/2017 9:35 PM   To contact Stroke Continuity provider, please refer to [email or web address] After hours, contact General Neurology

## 2017-09-07 NOTE — Progress Notes (Signed)
PROGRESS NOTE    Tony Jackson.  HQI:[id number] DOB: [birth year] DOA: 09/06/2017 PCP: [name], MD   Brief Narrative: Patient is a 77 year old male with past medical history of hypertension, hyperlipidemia, CKD stage III, CVA with residual balance issues, hyperparathyroidism who presents to the emergency department with complaints of peripheral vision loss on the left eye.  Patient had acute onset of vision loss on his left eye and went to see his ophthalmologist who recommended to go to the emergency department.  Code stroke was called on presentation.  Neurology was consulted.  Assessment & Plan:   Principal Problem:   CVA (cerebral vascular accident) ([place]) Active Problems:   Type 2 diabetes mellitus with renal manifestations ([place])   Hyperlipemia   Acute kidney injury superimposed on chronic kidney disease ([place])   Essential hypertension   Late effect of cerebrovascular accident (CVA)   Hyperparathyroidism ([place])  Left-sided peripheral vision loss: Resolved.  Stroke versus TIA.  Currently his vision is normal.  CT imaging showed.  Neurology following.  Waiting for MRI today.  Also undergoing echocardiogram.  Neurology planning to start on anticoagulation. CT angios head/neck showed no emergent large vessel occlusion.Moderate to severe stenosis distal RIGHT P2 segment (fetal origin). Multifocal cerebral artery moderate stenosis compatible with atherosclerosis.CT cerebral perfusion showed small area mesial RIGHT occipital lobe/PCA territory brain at risk. PT/OT/speech evaluated  the patient today and he does not need any follow-up. Continue Lipitor.  Already on aspirin and plavix.  AKI and CKD: Started IV fluids.  Kidney function has improved close to baseline.IV fluids has been stopped.  Hypertension: Allowing permissive hypertension for 24 to 48 hours.  Lisinopril, amlodipine has been held.  History of hypercalcemia/hyperparathyroidism: Follows with  endocrinology.  Diabetes type 2: Continue sliding scale insulin here.  Hyperlipidemia: Continue Lipitor  History of PSVT status post ablation: Also has loop recorder.  Follows with cardiology Dr. [name] . He will follow-up with him as an outpatient.  Needs to rule out paroxysmal A. Fib.     DVT prophylaxis: Lovenox Code Status: Full Family Communication: Wife present at the bedside Disposition Plan: Home tomorrow, awaiting MRI   Consultants: Neurology  Procedures: None  Antimicrobials: None  Subjective: Patient seen and examined the bedside this morning.  Remains comfortable.  Denies any complaints.  Vision loss has resolved.  Objective: Vitals:   09/07/17 0000 09/07/17 0200 09/07/17 0355 09/07/17 0552  BP: 132/85 139/87 124/77 131/77  Pulse: 67 65 62 62  Resp: 20 (!) 22 (!) 22 20  Temp: 98.5 F (36.9 C) (!) 97.5 F (36.4 C) (!) 97.5 F (36.4 C) 98.3 F (36.8 C)  TempSrc: Oral Oral Oral Oral  SpO2: 99% 100% 100% 99%  Weight:      Height:        Intake/Output Summary (Last 24 hours) at 09/07/2017 1456 Last data filed at 09/07/2017 0400 Gross per 24 hour  Intake 1100 ml  Output -  Net 1100 ml   Filed Weights   09/06/17 1743 09/06/17 2155  Weight: 76.2 kg (168 lb) 73.5 kg (162 lb 0.6 oz)    Examination:  General exam: Appears calm and comfortable ,Not in distress,average built HEENT:PERRL,Oral mucosa moist, Ear/Nose normal on gross exam Respiratory system: Bilateral equal air entry, normal vesicular breath sounds, no wheezes or crackles  Cardiovascular system: S1 & S2 heard, RRR. No JVD, murmurs, rubs, gallops or clicks. No pedal edema. Gastrointestinal system: Abdomen is nondistended, soft and nontender. No organomegaly or masses felt. Normal  bowel sounds heard. Central nervous system: Alert and oriented. No focal neurological deficits. Extremities: No edema, no clubbing ,no cyanosis, distal peripheral pulses palpable. Skin: No rashes, lesions or  ulcers,no icterus ,no pallor MSK: Normal muscle bulk,tone ,power Psychiatry: Judgement and insight appear normal. Mood & affect appropriate.     Data Reviewed: I have personally reviewed following labs and imaging studies  CBC: Recent Labs  Lab 09/06/17 1726 09/06/17 1943 09/07/17 0353  WBC  --  7.2 5.4  NEUTROABS  --  4.7  --   HGB 13.6 14.2 12.4*  HCT 40.0 43.2 36.4*  MCV  --  93.7 91.2  PLT  --  234 924   Basic Metabolic Panel: Recent Labs  Lab 09/06/17 1726 09/06/17 1943 09/07/17 0353  NA 143 144 144  K 4.5 4.5 4.3  CL 111 111 113*  CO2  --  23 23  GLUCOSE 129* 124* 97  BUN 45* 37* 30*  CREATININE 1.70* 1.71* 1.43*  CALCIUM  --  10.5* 9.5   GFR: Estimated Creatinine Clearance: 42.5 mL/min (A) (by C-G formula based on SCr of 1.43 mg/dL (H)). Liver Function Tests: Recent Labs  Lab 09/06/17 1943  AST 23  ALT 28  ALKPHOS 58  BILITOT 1.1  PROT 7.2  ALBUMIN 4.5   No results for input(s): LIPASE, AMYLASE in the last 168 hours. No results for input(s): AMMONIA in the last 168 hours. Coagulation Profile: Recent Labs  Lab 09/06/17 1943  INR 1.01   Cardiac Enzymes: No results for input(s): CKTOTAL, CKMB, CKMBINDEX, TROPONINI in the last 168 hours. BNP (last 3 results) No results for input(s): PROBNP in the last 8760 hours. HbA1C: Recent Labs    09/07/17 0353  HGBA1C 7.2*   CBG: Recent Labs  Lab 09/06/17 2200 09/07/17 0633 09/07/17 1202  GLUCAP 183* 96 198*   Lipid Profile: Recent Labs    09/07/17 0353  CHOL 129  HDL 35*  LDLCALC 68  TRIG 128  CHOLHDL 3.7   Thyroid Function Tests: No results for input(s): TSH, T4TOTAL, FREET4, T3FREE, THYROIDAB in the last 72 hours. Anemia Panel: No results for input(s): VITAMINB12, FOLATE, FERRITIN, TIBC, IRON, RETICCTPCT in the last 72 hours. Sepsis Labs: No results for input(s): PROCALCITON, LATICACIDVEN in the last 168 hours.  No results found for this or any previous visit (from the past 240  hour(s)).       Radiology Studies: Ct Angio Head W Or Wo Contrast  Result Date: 09/06/2017 CLINICAL DATA:  Follow up code stroke. History of RIGHT MCA infarct. EXAM: CT ANGIOGRAPHY HEAD AND NECK CT PERFUSION BRAIN TECHNIQUE: Multidetector CT imaging of the head and neck was performed using the standard protocol during bolus administration of intravenous contrast. Multiplanar CT image reconstructions and MIPs were obtained to evaluate the vascular anatomy. Carotid stenosis measurements (when applicable) are obtained utilizing NASCET criteria, using the distal internal carotid diameter as the denominator. Multiphase CT imaging of the brain was performed following IV bolus contrast injection. Subsequent parametric perfusion maps were calculated using RAPID software. CONTRAST:  90 cc ISOVUE-370 IOPAMIDOL (ISOVUE-370) INJECTION 76% COMPARISON:  CT HEAD September 06, 2017 and MRA head May 20, 2014. FINDINGS: CTA NECK FINDINGS AORTIC ARCH: Normal appearance of the thoracic arch, 2 vessel arch is a normal variant. Mild intimal thickening aortic arch and arch vessel origins. The origins of the innominate, left Common carotid artery and subclavian artery are widely patent. RIGHT CAROTID SYSTEM: Common carotid artery is widely patent, coursing in a straight line  fashion. Normal appearance of the carotid bifurcation without hemodynamically significant stenosis by NASCET criteria. Normal appearance of the included internal carotid artery. LEFT CAROTID SYSTEM: Common carotid artery is widely patent, coursing in a straight line fashion. Normal appearance of the carotid bifurcation without hemodynamically significant stenosis by NASCET criteria. Normal appearance of the included internal carotid artery. VERTEBRAL ARTERIES:Codominant vertebral arteries. Normal appearance of the vertebral arteries, which appear widely patent. SKELETON: No acute osseous process though bone windows have not been submitted. OTHER NECK: Soft  tissues of the neck are nonacute though, not tailored for evaluation. UPPER CHEST: Included lung apices are clear. No superior mediastinal lymphadenopathy. CTA HEAD FINDINGS ANTERIOR CIRCULATION: Patent cervical internal carotid arteries, petrous, cavernous and supra clinoid internal carotid arteries. 3 mm wide neck inferiorly directed LEFT PCOM origin aneurysm. Patent anterior communicating artery. Patent anterior and middle cerebral arteries, multifocal moderate stenoses progressed from prior MRA. No large vessel occlusion, significant stenosis, contrast extravasation. POSTERIOR CIRCULATION: Patent vertebral arteries, vertebrobasilar junction and basilar artery, as well as main branch vessels. Patent posterior cerebral arteries, multifocal moderate stenosis. Moderate to severe stenosis distal RIGHT P2 segment new from prior MRA. No large vessel occlusion, contrast extravasation or aneurysm. VENOUS SINUSES: Major dural venous sinuses are patent though not tailored for evaluation on this angiographic examination. ANATOMIC VARIANTS: Hypoplastic RIGHT A1 segment. Fetal origin RIGHT posterior cerebral artery. DELAYED PHASE: Not performed. MIP images reviewed. CT Brain Perfusion Findings: CBF (<30%) Volume: 5mL Perfusion (Tmax>6.0s) volume: 63mL Mismatch Volume: 50mL Infarction Location:RIGHT mesial occipital lobe. IMPRESSION: CTA NECK: 1. No hemodynamically significant stenosis ICA. Patent vertebral arteries. CTA HEAD: 1.  No emergent large vessel occlusion. 2. Moderate to severe stenosis distal RIGHT P2 segment (fetal origin). Multifocal cerebral artery moderate stenosis compatible with atherosclerosis. 3. Intact 3 mm LEFT PCOM aneurysm. Neuro-Interventional Radiology consultation is suggested to evaluate the appropriateness of potential treatment. Non-emergent evaluation can be arranged by calling [phone] during usual hours. CT PERFUSION: 1. Small area mesial RIGHT occipital lobe/PCA territory brain at risk. Acute  findings discussed with and reconfirmed by Dr.ASHISH ARORA on 09/06/2017 at 5:45 pm. Electronically Signed   By: Elon Alas M.D.   On: 09/06/2017 17:55   Ct Angio Neck W Or Wo Contrast  Result Date: 09/06/2017 CLINICAL DATA:  Follow up code stroke. History of RIGHT MCA infarct. EXAM: CT ANGIOGRAPHY HEAD AND NECK CT PERFUSION BRAIN TECHNIQUE: Multidetector CT imaging of the head and neck was performed using the standard protocol during bolus administration of intravenous contrast. Multiplanar CT image reconstructions and MIPs were obtained to evaluate the vascular anatomy. Carotid stenosis measurements (when applicable) are obtained utilizing NASCET criteria, using the distal internal carotid diameter as the denominator. Multiphase CT imaging of the brain was performed following IV bolus contrast injection. Subsequent parametric perfusion maps were calculated using RAPID software. CONTRAST:  90 cc ISOVUE-370 IOPAMIDOL (ISOVUE-370) INJECTION 76% COMPARISON:  CT HEAD September 06, 2017 and MRA head May 20, 2014. FINDINGS: CTA NECK FINDINGS AORTIC ARCH: Normal appearance of the thoracic arch, 2 vessel arch is a normal variant. Mild intimal thickening aortic arch and arch vessel origins. The origins of the innominate, left Common carotid artery and subclavian artery are widely patent. RIGHT CAROTID SYSTEM: Common carotid artery is widely patent, coursing in a straight line fashion. Normal appearance of the carotid bifurcation without hemodynamically significant stenosis by NASCET criteria. Normal appearance of the included internal carotid artery. LEFT CAROTID SYSTEM: Common carotid artery is widely patent, coursing in a straight line fashion. Normal  appearance of the carotid bifurcation without hemodynamically significant stenosis by NASCET criteria. Normal appearance of the included internal carotid artery. VERTEBRAL ARTERIES:Codominant vertebral arteries. Normal appearance of the vertebral arteries, which  appear widely patent. SKELETON: No acute osseous process though bone windows have not been submitted. OTHER NECK: Soft tissues of the neck are nonacute though, not tailored for evaluation. UPPER CHEST: Included lung apices are clear. No superior mediastinal lymphadenopathy. CTA HEAD FINDINGS ANTERIOR CIRCULATION: Patent cervical internal carotid arteries, petrous, cavernous and supra clinoid internal carotid arteries. 3 mm wide neck inferiorly directed LEFT PCOM origin aneurysm. Patent anterior communicating artery. Patent anterior and middle cerebral arteries, multifocal moderate stenoses progressed from prior MRA. No large vessel occlusion, significant stenosis, contrast extravasation. POSTERIOR CIRCULATION: Patent vertebral arteries, vertebrobasilar junction and basilar artery, as well as main branch vessels. Patent posterior cerebral arteries, multifocal moderate stenosis. Moderate to severe stenosis distal RIGHT P2 segment new from prior MRA. No large vessel occlusion, contrast extravasation or aneurysm. VENOUS SINUSES: Major dural venous sinuses are patent though not tailored for evaluation on this angiographic examination. ANATOMIC VARIANTS: Hypoplastic RIGHT A1 segment. Fetal origin RIGHT posterior cerebral artery. DELAYED PHASE: Not performed. MIP images reviewed. CT Brain Perfusion Findings: CBF (<30%) Volume: 75mL Perfusion (Tmax>6.0s) volume: 45mL Mismatch Volume: 69mL Infarction Location:RIGHT mesial occipital lobe. IMPRESSION: CTA NECK: 1. No hemodynamically significant stenosis ICA. Patent vertebral arteries. CTA HEAD: 1.  No emergent large vessel occlusion. 2. Moderate to severe stenosis distal RIGHT P2 segment (fetal origin). Multifocal cerebral artery moderate stenosis compatible with atherosclerosis. 3. Intact 3 mm LEFT PCOM aneurysm. Neuro-Interventional Radiology consultation is suggested to evaluate the appropriateness of potential treatment. Non-emergent evaluation can be arranged by calling  [phone] during usual hours. CT PERFUSION: 1. Small area mesial RIGHT occipital lobe/PCA territory brain at risk. Acute findings discussed with and reconfirmed by Dr.ASHISH ARORA on 09/06/2017 at 5:45 pm. Electronically Signed   By: Elon Alas M.D.   On: 09/06/2017 17:55   Ct Cerebral Perfusion W Contrast  Result Date: 09/06/2017 CLINICAL DATA:  Follow up code stroke. History of RIGHT MCA infarct. EXAM: CT ANGIOGRAPHY HEAD AND NECK CT PERFUSION BRAIN TECHNIQUE: Multidetector CT imaging of the head and neck was performed using the standard protocol during bolus administration of intravenous contrast. Multiplanar CT image reconstructions and MIPs were obtained to evaluate the vascular anatomy. Carotid stenosis measurements (when applicable) are obtained utilizing NASCET criteria, using the distal internal carotid diameter as the denominator. Multiphase CT imaging of the brain was performed following IV bolus contrast injection. Subsequent parametric perfusion maps were calculated using RAPID software. CONTRAST:  90 cc ISOVUE-370 IOPAMIDOL (ISOVUE-370) INJECTION 76% COMPARISON:  CT HEAD September 06, 2017 and MRA head May 20, 2014. FINDINGS: CTA NECK FINDINGS AORTIC ARCH: Normal appearance of the thoracic arch, 2 vessel arch is a normal variant. Mild intimal thickening aortic arch and arch vessel origins. The origins of the innominate, left Common carotid artery and subclavian artery are widely patent. RIGHT CAROTID SYSTEM: Common carotid artery is widely patent, coursing in a straight line fashion. Normal appearance of the carotid bifurcation without hemodynamically significant stenosis by NASCET criteria. Normal appearance of the included internal carotid artery. LEFT CAROTID SYSTEM: Common carotid artery is widely patent, coursing in a straight line fashion. Normal appearance of the carotid bifurcation without hemodynamically significant stenosis by NASCET criteria. Normal appearance of the included  internal carotid artery. VERTEBRAL ARTERIES:Codominant vertebral arteries. Normal appearance of the vertebral arteries, which appear widely patent. SKELETON: No acute osseous process  though bone windows have not been submitted. OTHER NECK: Soft tissues of the neck are nonacute though, not tailored for evaluation. UPPER CHEST: Included lung apices are clear. No superior mediastinal lymphadenopathy. CTA HEAD FINDINGS ANTERIOR CIRCULATION: Patent cervical internal carotid arteries, petrous, cavernous and supra clinoid internal carotid arteries. 3 mm wide neck inferiorly directed LEFT PCOM origin aneurysm. Patent anterior communicating artery. Patent anterior and middle cerebral arteries, multifocal moderate stenoses progressed from prior MRA. No large vessel occlusion, significant stenosis, contrast extravasation. POSTERIOR CIRCULATION: Patent vertebral arteries, vertebrobasilar junction and basilar artery, as well as main branch vessels. Patent posterior cerebral arteries, multifocal moderate stenosis. Moderate to severe stenosis distal RIGHT P2 segment new from prior MRA. No large vessel occlusion, contrast extravasation or aneurysm. VENOUS SINUSES: Major dural venous sinuses are patent though not tailored for evaluation on this angiographic examination. ANATOMIC VARIANTS: Hypoplastic RIGHT A1 segment. Fetal origin RIGHT posterior cerebral artery. DELAYED PHASE: Not performed. MIP images reviewed. CT Brain Perfusion Findings: CBF (<30%) Volume: 4mL Perfusion (Tmax>6.0s) volume: 61mL Mismatch Volume: 43mL Infarction Location:RIGHT mesial occipital lobe. IMPRESSION: CTA NECK: 1. No hemodynamically significant stenosis ICA. Patent vertebral arteries. CTA HEAD: 1.  No emergent large vessel occlusion. 2. Moderate to severe stenosis distal RIGHT P2 segment (fetal origin). Multifocal cerebral artery moderate stenosis compatible with atherosclerosis. 3. Intact 3 mm LEFT PCOM aneurysm. Neuro-Interventional Radiology  consultation is suggested to evaluate the appropriateness of potential treatment. Non-emergent evaluation can be arranged by calling [phone] during usual hours. CT PERFUSION: 1. Small area mesial RIGHT occipital lobe/PCA territory brain at risk. Acute findings discussed with and reconfirmed by Dr.ASHISH ARORA on 09/06/2017 at 5:45 pm. Electronically Signed   By: Elon Alas M.D.   On: 09/06/2017 17:55   Ct Head Code Stroke Wo Contrast  Result Date: 09/06/2017 CLINICAL DATA:  Code stroke. Code stroke, LEFT gaze preference. Last seen normal at 1200 hours. EXAM: CT HEAD WITHOUT CONTRAST TECHNIQUE: Contiguous axial images were obtained from the base of the skull through the vertex without intravenous contrast. COMPARISON:  MRI head May 20, 2014 FINDINGS: BRAIN: No intraparenchymal hemorrhage, mass effect nor midline shift. Small area RIGHT frontal lobe encephalomalacia at convexity. Confluent supratentorial white matter hypodensities. Tiny basal ganglia and thalami hypodensities associated with chronic small vessel ischemic changes. Old bilateral small cerebellar infarcts. No acute large vascular territory infarcts. Mild parenchymal brain volume loss. No hydrocephalus. No abnormal extra-axial fluid collections. Basal cisterns are patent. VASCULAR: Mild calcific atherosclerosis of the carotid siphons. SKULL: No skull fracture. No significant scalp soft tissue swelling. SINUSES/ORBITS: The mastoid air-cells and included paranasal sinuses are well-aerated.The included ocular globes and orbital contents are non-suspicious. Status post bilateral ocular lens implants. OTHER: None. ASPECTS [facility] Stroke Program Early CT Score) - Ganglionic level infarction (caudate, lentiform nuclei, internal capsule, insula, M1-M3 cortex): 7 - Supraganglionic infarction (M4-M6 cortex): 3 Total score (0-10 with 10 being normal): 10 IMPRESSION: 1. No acute intracranial process. 2. ASPECTS is 10. 3. Old small RIGHT frontal/MCA  territory infarct. 4. Moderate to severe chronic small vessel ischemic disease. Old small cerebellar infarcts. 5. Critical Value/emergent results text paged to [place] via [employer] secure system on 09/06/2017 at 5:20 pm, including interpreting physician's phone number. Electronically Signed   By: Elon Alas M.D.   On: 09/06/2017 17:25        Scheduled Meds: . aspirin  325 mg Oral Daily  . atorvastatin  80 mg Oral q1800  . enoxaparin (LOVENOX) injection  40 mg Subcutaneous Q24H  . insulin  aspart  0-5 Units Subcutaneous QHS  . insulin aspart  0-9 Units Subcutaneous TID WC   Continuous Infusions:   LOS: 0 days    Time spent:25 mins. More than 50% of that time was spent in counseling and/or coordination of care.      Shelly Coss, MD [facility] Pager [phone]  If 7PM-7AM, please contact night-coverage [email or web address] Password [email or web address] 09/07/2017, 2:56 PM

## 2017-09-08 DIAGNOSIS — H5340 Unspecified visual field defects: Secondary | ICD-10-CM | POA: Diagnosis not present

## 2017-09-08 DIAGNOSIS — E1121 Type 2 diabetes mellitus with diabetic nephropathy: Secondary | ICD-10-CM | POA: Diagnosis not present

## 2017-09-08 DIAGNOSIS — I10 Essential (primary) hypertension: Secondary | ICD-10-CM | POA: Diagnosis not present

## 2017-09-08 DIAGNOSIS — E860 Dehydration: Secondary | ICD-10-CM | POA: Diagnosis not present

## 2017-09-08 DIAGNOSIS — N179 Acute kidney failure, unspecified: Secondary | ICD-10-CM | POA: Diagnosis not present

## 2017-09-08 DIAGNOSIS — I6300 Cerebral infarction due to thrombosis of unspecified precerebral artery: Secondary | ICD-10-CM

## 2017-09-08 LAB — GLUCOSE, CAPILLARY
Glucose-Capillary: 126 mg/dL — ABNORMAL HIGH (ref 70–99)
Glucose-Capillary: 244 mg/dL — ABNORMAL HIGH (ref 70–99)

## 2017-09-08 MED ORDER — ATORVASTATIN CALCIUM 80 MG PO TABS: 80.0000 mg | ORAL_TABLET | Freq: Every day | ORAL | 0 refills | Status: DC

## 2017-09-08 MED ORDER — CLOPIDOGREL BISULFATE 75 MG PO TABS: 75.0000 mg | ORAL_TABLET | Freq: Every day | ORAL | 0 refills | Status: DC

## 2017-09-08 MED ORDER — ASPIRIN EC 81 MG PO TBEC: DELAYED_RELEASE_TABLET | ORAL | Status: DC

## 2017-09-08 NOTE — Plan of Care (Signed)
Tony Jackson will be discharged this afternoon.  He will be returning home with his wife.  Although his balance is still mildly affected by his vision.  I believe he has a good support system in his wife and neighbors.   Discharge education:  antiplatelet medications, bleeding precautions, fall risk and stroke education.

## 2017-09-08 NOTE — Discharge Summary (Signed)
Physician Discharge Summary  Tony Jackson. KXF:[id number] DOB: [birth year] DOA: 09/06/2017  PCP: Tony Carbon, MD  Admit date: 09/06/2017 Discharge date: 09/08/2017  Admitted From: Home Disposition:  Home  Discharge Condition:Stable CODE STATUS:FULL Diet recommendation: Heart Healthy   Brief/Interim Summary: Patient is a 77 year old male with past medical history of hypertension, hyperlipidemia, CKD stage III, CVA with residual balance issues, hyperparathyroidism who presents to the emergency department with complaints of peripheral vision loss on the left eye.  Patient had acute onset of vision loss on his left eye and went to see his ophthalmologist who recommended to go to the emergency department.  Code stroke was called on presentation.  Neurology was consulted.  MRI of the brain showed small acute right PCA infarct.  He was evaluated by PT/OT and no follow-up was recommended. He is being discharged home today.  He will continue on aspirin and Plavix for 3 weeks and then continue on Plavix after that.  He will follow-up with neurology as an outpatient.  Following problems were addressed during his hospitalization:  Left-sided peripheral vision loss: Resolved.    Acute ischemic stroke:MRI of the brain showed small acute right PCA infarct. Currently his vision is normal.  Neurology was following.   Echocardiogram showed ejection fraction of 60 to 65%, no regional wall motion abnormality, grade 1 diastolic dysfunction. CT angios head/neck showed no emergent large vessel occlusion.Moderate to severe stenosis distal RIGHT P2 segment (fetal origin). Multifocal cerebral artery moderate stenosis compatible with atherosclerosis.CT cerebral perfusion showed small area mesial RIGHT occipital lobe/PCA territory brain at risk. PT/OT/speech evaluated  the patient today and he does not need any follow-up. Continue Lipitor.  Already on aspirin and plavix which he will continue for 3 weeks and  after that he will be on Plavix alone.  AKI and CKD: Started IV fluids.  Kidney function has improved close to baseline.IV fluids has been stopped.  Hypertension: Resume home meds.  History of hypercalcemia/hyperparathyroidism: Follows with endocrinology.  Diabetes type 2: Continue home regimen.  Hyperlipidemia: Continue Lipitor  History of PSVT status post ablation: Also has loop recorder.  Follows with cardiology Dr. [name] . He will follow-up with him as an outpatient.    Discharge Diagnoses:  Principal Problem:   CVA (cerebral vascular accident) (Tony Jackson) Active Problems:   Type 2 diabetes mellitus with renal manifestations (Tony Jackson)   Hyperlipemia   Acute kidney injury superimposed on chronic kidney disease (Tony Jackson)   Essential hypertension   Late effect of cerebrovascular accident (CVA)   History of stroke   Hyperparathyroidism Tony Jackson)    Discharge Instructions  Discharge Instructions    Diet - low sodium heart healthy   Complete by:  As directed    Discharge instructions   Complete by:  As directed    1) Follow up with neurology as an outpatient. 2)Take prescribed medications as instructed.  Continue both aspirin and Plavix for 3 weeks.  After that continue only on Plavix. 3)Follow-up with your PCP in a week.   Increase activity slowly   Complete by:  As directed      Allergies as of 09/08/2017   No Known Allergies     Medication List    STOP taking these medications   simvastatin 40 MG tablet Commonly known as:  ZOCOR     TAKE these medications   amLODipine 10 MG tablet Commonly known as:  NORVASC TAKE 1 TABLET BY MOUTH EVERY DAY   aspirin EC 81 MG tablet Continue for 3  more weeks. Then stop aspirin and take only plavix. What changed:    how much to take  how to take this  when to take this  additional instructions   atorvastatin 80 MG tablet Commonly known as:  LIPITOR Take 1 tablet (80 mg total) by mouth daily at 6 PM.   clopidogrel 75  MG tablet Commonly known as:  PLAVIX Take 1 tablet (75 mg total) by mouth daily. Start taking on:  09/09/2017   glipiZIDE 5 MG 24 hr tablet Commonly known as:  GLUCOTROL XL TAKE 1 TABLET BY MOUTH EVERY DAY WITH BREAKFAST   glucose blood test strip Commonly known as:  ONETOUCH VERIO Use 1 strip daily to check blood sugar Dx Code E11.21   ketoconazole 2 % shampoo Commonly known as:  NIZORAL APPLY 1 APPLICATION TOPICALLY 2 (TWO) TIMES A WEEK.   lisinopril 40 MG tablet Commonly known as:  PRINIVIL,ZESTRIL TAKE 1 TABLET BY MOUTH EVERY DAY   metFORMIN 1000 MG tablet Commonly known as:  GLUCOPHAGE TAKE 1 TABLET BY MOUTH TWICE A DAY WITH A MEAL   mometasone 0.1 % cream Commonly known as:  ELOCON Apply 1 application topically 2 (two) times daily as needed (skin).   ONETOUCH DELICA LANCETS FINE Misc Use 1 daily to obtain blood sample Dx Code E11.21   triamcinolone cream 0.1 % Commonly known as:  KENALOG Apply 1 application topically 2 (two) times daily as needed (itching).      Follow-up Information    Tony Carbon, MD. Schedule an appointment as soon as possible for a visit in 1 week(s).   Specialties:  Internal Medicine, Pediatrics Contact information: [place] [address] [phone]        Garvin Fila, MD. Schedule an appointment as soon as possible for a visit in 4 week(s).   Specialties:  Neurology, Radiology Contact information: [address] [place] [address] [phone]          No Known Allergies  Consultations:  Neurology   Procedures/Studies: Ct Angio Head W Or Wo Contrast  Result Date: 09/06/2017 CLINICAL DATA:  Follow up code stroke. History of RIGHT MCA infarct. EXAM: CT ANGIOGRAPHY HEAD AND NECK CT PERFUSION BRAIN TECHNIQUE: Multidetector CT imaging of the head and neck was performed using the standard protocol during bolus administration of intravenous contrast. Multiplanar CT image  reconstructions and MIPs were obtained to evaluate the vascular anatomy. Carotid stenosis measurements (when applicable) are obtained utilizing NASCET criteria, using the distal internal carotid diameter as the denominator. Multiphase CT imaging of the brain was performed following IV bolus contrast injection. Subsequent parametric perfusion maps were calculated using RAPID software. CONTRAST:  90 cc ISOVUE-370 IOPAMIDOL (ISOVUE-370) INJECTION 76% COMPARISON:  CT HEAD September 06, 2017 and MRA head May 20, 2014. FINDINGS: CTA NECK FINDINGS AORTIC ARCH: Normal appearance of the thoracic arch, 2 vessel arch is a normal variant. Mild intimal thickening aortic arch and arch vessel origins. The origins of the innominate, left Common carotid artery and subclavian artery are widely patent. RIGHT CAROTID SYSTEM: Common carotid artery is widely patent, coursing in a straight line fashion. Normal appearance of the carotid bifurcation without hemodynamically significant stenosis by NASCET criteria. Normal appearance of the included internal carotid artery. LEFT CAROTID SYSTEM: Common carotid artery is widely patent, coursing in a straight line fashion. Normal appearance of the carotid bifurcation without hemodynamically significant stenosis by NASCET criteria. Normal appearance of the included internal carotid artery. VERTEBRAL ARTERIES:Codominant vertebral  arteries. Normal appearance of the vertebral arteries, which appear widely patent. SKELETON: No acute osseous process though bone windows have not been submitted. OTHER NECK: Soft tissues of the neck are nonacute though, not tailored for evaluation. UPPER CHEST: Included lung apices are clear. No superior mediastinal lymphadenopathy. CTA HEAD FINDINGS ANTERIOR CIRCULATION: Patent cervical internal carotid arteries, petrous, cavernous and supra clinoid internal carotid arteries. 3 mm wide neck inferiorly directed LEFT PCOM origin aneurysm. Patent anterior communicating artery.  Patent anterior and middle cerebral arteries, multifocal moderate stenoses progressed from prior MRA. No large vessel occlusion, significant stenosis, contrast extravasation. POSTERIOR CIRCULATION: Patent vertebral arteries, vertebrobasilar junction and basilar artery, as well as main branch vessels. Patent posterior cerebral arteries, multifocal moderate stenosis. Moderate to severe stenosis distal RIGHT P2 segment new from prior MRA. No large vessel occlusion, contrast extravasation or aneurysm. VENOUS SINUSES: Major dural venous sinuses are patent though not tailored for evaluation on this angiographic examination. ANATOMIC VARIANTS: Hypoplastic RIGHT A1 segment. Fetal origin RIGHT posterior cerebral artery. DELAYED PHASE: Not performed. MIP images reviewed. CT Brain Perfusion Findings: CBF (<30%) Volume: 95mL Perfusion (Tmax>6.0s) volume: 68mL Mismatch Volume: 84mL Infarction Location:RIGHT mesial occipital lobe. IMPRESSION: CTA NECK: 1. No hemodynamically significant stenosis ICA. Patent vertebral arteries. CTA HEAD: 1.  No emergent large vessel occlusion. 2. Moderate to severe stenosis distal RIGHT P2 segment (fetal origin). Multifocal cerebral artery moderate stenosis compatible with atherosclerosis. 3. Intact 3 mm LEFT PCOM aneurysm. Neuro-Interventional Radiology consultation is suggested to evaluate the appropriateness of potential treatment. Non-emergent evaluation can be arranged by calling [phone] during usual hours. CT PERFUSION: 1. Small area mesial RIGHT occipital lobe/PCA territory brain at risk. Acute findings discussed with and reconfirmed by Dr.ASHISH ARORA on 09/06/2017 at 5:45 pm. Electronically Signed   By: Elon Alas M.D.   On: 09/06/2017 17:55   Ct Angio Neck W Or Wo Contrast  Result Date: 09/06/2017 CLINICAL DATA:  Follow up code stroke. History of RIGHT MCA infarct. EXAM: CT ANGIOGRAPHY HEAD AND NECK CT PERFUSION BRAIN TECHNIQUE: Multidetector CT imaging of the head and neck  was performed using the standard protocol during bolus administration of intravenous contrast. Multiplanar CT image reconstructions and MIPs were obtained to evaluate the vascular anatomy. Carotid stenosis measurements (when applicable) are obtained utilizing NASCET criteria, using the distal internal carotid diameter as the denominator. Multiphase CT imaging of the brain was performed following IV bolus contrast injection. Subsequent parametric perfusion maps were calculated using RAPID software. CONTRAST:  90 cc ISOVUE-370 IOPAMIDOL (ISOVUE-370) INJECTION 76% COMPARISON:  CT HEAD September 06, 2017 and MRA head May 20, 2014. FINDINGS: CTA NECK FINDINGS AORTIC ARCH: Normal appearance of the thoracic arch, 2 vessel arch is a normal variant. Mild intimal thickening aortic arch and arch vessel origins. The origins of the innominate, left Common carotid artery and subclavian artery are widely patent. RIGHT CAROTID SYSTEM: Common carotid artery is widely patent, coursing in a straight line fashion. Normal appearance of the carotid bifurcation without hemodynamically significant stenosis by NASCET criteria. Normal appearance of the included internal carotid artery. LEFT CAROTID SYSTEM: Common carotid artery is widely patent, coursing in a straight line fashion. Normal appearance of the carotid bifurcation without hemodynamically significant stenosis by NASCET criteria. Normal appearance of the included internal carotid artery. VERTEBRAL ARTERIES:Codominant vertebral arteries. Normal appearance of the vertebral arteries, which appear widely patent. SKELETON: No acute osseous process though bone windows have not been submitted. OTHER NECK: Soft tissues of the neck are nonacute though, not tailored for evaluation. UPPER  CHEST: Included lung apices are clear. No superior mediastinal lymphadenopathy. CTA HEAD FINDINGS ANTERIOR CIRCULATION: Patent cervical internal carotid arteries, petrous, cavernous and supra clinoid internal  carotid arteries. 3 mm wide neck inferiorly directed LEFT PCOM origin aneurysm. Patent anterior communicating artery. Patent anterior and middle cerebral arteries, multifocal moderate stenoses progressed from prior MRA. No large vessel occlusion, significant stenosis, contrast extravasation. POSTERIOR CIRCULATION: Patent vertebral arteries, vertebrobasilar junction and basilar artery, as well as main branch vessels. Patent posterior cerebral arteries, multifocal moderate stenosis. Moderate to severe stenosis distal RIGHT P2 segment new from prior MRA. No large vessel occlusion, contrast extravasation or aneurysm. VENOUS SINUSES: Major dural venous sinuses are patent though not tailored for evaluation on this angiographic examination. ANATOMIC VARIANTS: Hypoplastic RIGHT A1 segment. Fetal origin RIGHT posterior cerebral artery. DELAYED PHASE: Not performed. MIP images reviewed. CT Brain Perfusion Findings: CBF (<30%) Volume: 38mL Perfusion (Tmax>6.0s) volume: 84mL Mismatch Volume: 12mL Infarction Location:RIGHT mesial occipital lobe. IMPRESSION: CTA NECK: 1. No hemodynamically significant stenosis ICA. Patent vertebral arteries. CTA HEAD: 1.  No emergent large vessel occlusion. 2. Moderate to severe stenosis distal RIGHT P2 segment (fetal origin). Multifocal cerebral artery moderate stenosis compatible with atherosclerosis. 3. Intact 3 mm LEFT PCOM aneurysm. Neuro-Interventional Radiology consultation is suggested to evaluate the appropriateness of potential treatment. Non-emergent evaluation can be arranged by calling [phone] during usual hours. CT PERFUSION: 1. Small area mesial RIGHT occipital lobe/PCA territory brain at risk. Acute findings discussed with and reconfirmed by Dr.ASHISH ARORA on 09/06/2017 at 5:45 pm. Electronically Signed   By: Elon Alas M.D.   On: 09/06/2017 17:55   Mr Brain Wo Contrast  Result Date: 09/07/2017 CLINICAL DATA:  Acute left eye visual loss.  Prior stroke. EXAM: MRI HEAD  WITHOUT CONTRAST TECHNIQUE: Multiplanar, multiecho pulse sequences of the brain and surrounding structures were obtained without intravenous contrast. COMPARISON:  Head CT/CTA/perfusion 09/06/2017 and MRI 05/20/2014 FINDINGS: Brain: There is a small region of confluent gyral restricted diffusion in the medial right occipital lobe consistent with acute infarct. There is also a punctate acute infarct in the right lateral splenium of the corpus callosum. No intracranial hemorrhage, mass, midline shift, or extra-axial fluid collection is identified. Small chronic infarcts are again noted in the right frontal lobe (MCA territory) and both cerebellar hemispheres. Patchy to confluent T2 hyperintensities in the cerebral white matter bilaterally are nonspecific but compatible with moderate chronic small vessel ischemic disease, similar to the prior MRI. Mild chronic small vessel changes are noted in the pons. There is mild cerebral atrophy. Vascular: Major intracranial arterial flow voids are preserved. Chronically asymmetric appearance of the left transverse and sigmoid sinus flow voids likely reflects slow flow. Skull and upper cervical spine: Unremarkable bone marrow signal. Sinuses/Orbits: Bilateral cataract extraction. Paranasal sinuses and mastoid air cells are clear. Other: None. IMPRESSION: 1. Small acute right PCA infarct. 2. Moderate chronic small vessel ischemic disease. 3. Chronic right frontal and bilateral cerebellar infarcts. Electronically Signed   By: Logan Bores M.D.   On: 09/07/2017 16:51   Ct Cerebral Perfusion W Contrast  Result Date: 09/06/2017 CLINICAL DATA:  Follow up code stroke. History of RIGHT MCA infarct. EXAM: CT ANGIOGRAPHY HEAD AND NECK CT PERFUSION BRAIN TECHNIQUE: Multidetector CT imaging of the head and neck was performed using the standard protocol during bolus administration of intravenous contrast. Multiplanar CT image reconstructions and MIPs were obtained to evaluate the vascular  anatomy. Carotid stenosis measurements (when applicable) are obtained utilizing NASCET criteria, using the distal internal carotid diameter  as the denominator. Multiphase CT imaging of the brain was performed following IV bolus contrast injection. Subsequent parametric perfusion maps were calculated using RAPID software. CONTRAST:  90 cc ISOVUE-370 IOPAMIDOL (ISOVUE-370) INJECTION 76% COMPARISON:  CT HEAD September 06, 2017 and MRA head May 20, 2014. FINDINGS: CTA NECK FINDINGS AORTIC ARCH: Normal appearance of the thoracic arch, 2 vessel arch is a normal variant. Mild intimal thickening aortic arch and arch vessel origins. The origins of the innominate, left Common carotid artery and subclavian artery are widely patent. RIGHT CAROTID SYSTEM: Common carotid artery is widely patent, coursing in a straight line fashion. Normal appearance of the carotid bifurcation without hemodynamically significant stenosis by NASCET criteria. Normal appearance of the included internal carotid artery. LEFT CAROTID SYSTEM: Common carotid artery is widely patent, coursing in a straight line fashion. Normal appearance of the carotid bifurcation without hemodynamically significant stenosis by NASCET criteria. Normal appearance of the included internal carotid artery. VERTEBRAL ARTERIES:Codominant vertebral arteries. Normal appearance of the vertebral arteries, which appear widely patent. SKELETON: No acute osseous process though bone windows have not been submitted. OTHER NECK: Soft tissues of the neck are nonacute though, not tailored for evaluation. UPPER CHEST: Included lung apices are clear. No superior mediastinal lymphadenopathy. CTA HEAD FINDINGS ANTERIOR CIRCULATION: Patent cervical internal carotid arteries, petrous, cavernous and supra clinoid internal carotid arteries. 3 mm wide neck inferiorly directed LEFT PCOM origin aneurysm. Patent anterior communicating artery. Patent anterior and middle cerebral arteries, multifocal  moderate stenoses progressed from prior MRA. No large vessel occlusion, significant stenosis, contrast extravasation. POSTERIOR CIRCULATION: Patent vertebral arteries, vertebrobasilar junction and basilar artery, as well as main branch vessels. Patent posterior cerebral arteries, multifocal moderate stenosis. Moderate to severe stenosis distal RIGHT P2 segment new from prior MRA. No large vessel occlusion, contrast extravasation or aneurysm. VENOUS SINUSES: Major dural venous sinuses are patent though not tailored for evaluation on this angiographic examination. ANATOMIC VARIANTS: Hypoplastic RIGHT A1 segment. Fetal origin RIGHT posterior cerebral artery. DELAYED PHASE: Not performed. MIP images reviewed. CT Brain Perfusion Findings: CBF (<30%) Volume: 11mL Perfusion (Tmax>6.0s) volume: 74mL Mismatch Volume: 16mL Infarction Location:RIGHT mesial occipital lobe. IMPRESSION: CTA NECK: 1. No hemodynamically significant stenosis ICA. Patent vertebral arteries. CTA HEAD: 1.  No emergent large vessel occlusion. 2. Moderate to severe stenosis distal RIGHT P2 segment (fetal origin). Multifocal cerebral artery moderate stenosis compatible with atherosclerosis. 3. Intact 3 mm LEFT PCOM aneurysm. Neuro-Interventional Radiology consultation is suggested to evaluate the appropriateness of potential treatment. Non-emergent evaluation can be arranged by calling [phone] during usual hours. CT PERFUSION: 1. Small area mesial RIGHT occipital lobe/PCA territory brain at risk. Acute findings discussed with and reconfirmed by Dr.ASHISH ARORA on 09/06/2017 at 5:45 pm. Electronically Signed   By: Elon Alas M.D.   On: 09/06/2017 17:55   Ct Head Code Stroke Wo Contrast  Result Date: 09/06/2017 CLINICAL DATA:  Code stroke. Code stroke, LEFT gaze preference. Last seen normal at 1200 hours. EXAM: CT HEAD WITHOUT CONTRAST TECHNIQUE: Contiguous axial images were obtained from the base of the skull through the vertex without  intravenous contrast. COMPARISON:  MRI head May 20, 2014 FINDINGS: BRAIN: No intraparenchymal hemorrhage, mass effect nor midline shift. Small area RIGHT frontal lobe encephalomalacia at convexity. Confluent supratentorial white matter hypodensities. Tiny basal ganglia and thalami hypodensities associated with chronic small vessel ischemic changes. Old bilateral small cerebellar infarcts. No acute large vascular territory infarcts. Mild parenchymal brain volume loss. No hydrocephalus. No abnormal extra-axial fluid collections. Basal cisterns are patent. VASCULAR:  Mild calcific atherosclerosis of the carotid siphons. SKULL: No skull fracture. No significant scalp soft tissue swelling. SINUSES/ORBITS: The mastoid air-cells and included paranasal sinuses are well-aerated.The included ocular globes and orbital contents are non-suspicious. Status post bilateral ocular lens implants. OTHER: None. ASPECTS [facility] Stroke Program Early CT Score) - Ganglionic level infarction (caudate, lentiform nuclei, internal capsule, insula, M1-M3 cortex): 7 - Supraganglionic infarction (M4-M6 cortex): 3 Total score (0-10 with 10 being normal): 10 IMPRESSION: 1. No acute intracranial process. 2. ASPECTS is 10. 3. Old small RIGHT frontal/MCA territory infarct. 4. Moderate to severe chronic small vessel ischemic disease. Old small cerebellar infarcts. 5. Critical Value/emergent results text paged to [place] via [employer] secure system on 09/06/2017 at 5:20 pm, including interpreting physician's phone number. Electronically Signed   By: Elon Alas M.D.   On: 09/06/2017 17:25       Subjective: Patient seen and examined the bedside this morning.  Remains comfortable.  No new issues/events.  Stable for discharge home today.  Discharge Exam: Vitals:   09/08/17 0840 09/08/17 1234  BP: 128/77 (!) 149/72  Pulse: 63 73  Resp: 18 18  Temp: 97.8 F (36.6 C) 97.6 F (36.4 C)  SpO2: 98% 96%   Vitals:   09/07/17 2320  09/08/17 0309 09/08/17 0840 09/08/17 1234  BP: 119/78 103/64 128/77 (!) 149/72  Pulse: 69 (!) 54 63 73  Resp: 10 18 18 18   Temp: 98.3 F (36.8 C) 97.9 F (36.6 C) 97.8 F (36.6 C) 97.6 F (36.4 C)  TempSrc: Oral Oral Oral Oral  SpO2: 96% 98% 98% 96%  Weight:      Height:        General: Pt is alert, awake, not in acute distress Cardiovascular: RRR, S1/S2 +, no rubs, no gallops Respiratory: CTA bilaterally, no wheezing, no rhonchi Abdominal: Soft, NT, ND, bowel sounds + Extremities: no edema, no cyanosis    The results of significant diagnostics from this hospitalization (including imaging, microbiology, ancillary and laboratory) are listed below for reference.     Microbiology: No results found for this or any previous visit (from the past 240 hour(s)).   Labs: BNP (last 3 results) No results for input(s): BNP in the last 8760 hours. Basic Metabolic Panel: Recent Labs  Lab 09/06/17 1726 09/06/17 1943 09/07/17 0353  NA 143 144 144  K 4.5 4.5 4.3  CL 111 111 113*  CO2  --  23 23  GLUCOSE 129* 124* 97  BUN 45* 37* 30*  CREATININE 1.70* 1.71* 1.43*  CALCIUM  --  10.5* 9.5   Liver Function Tests: Recent Labs  Lab 09/06/17 1943  AST 23  ALT 28  ALKPHOS 58  BILITOT 1.1  PROT 7.2  ALBUMIN 4.5   No results for input(s): LIPASE, AMYLASE in the last 168 hours. No results for input(s): AMMONIA in the last 168 hours. CBC: Recent Labs  Lab 09/06/17 1726 09/06/17 1943 09/07/17 0353  WBC  --  7.2 5.4  NEUTROABS  --  4.7  --   HGB 13.6 14.2 12.4*  HCT 40.0 43.2 36.4*  MCV  --  93.7 91.2  PLT  --  234 198   Cardiac Enzymes: No results for input(s): CKTOTAL, CKMB, CKMBINDEX, TROPONINI in the last 168 hours. BNP: Invalid input(s): POCBNP CBG: Recent Labs  Lab 09/07/17 1202 09/07/17 1708 09/07/17 2146 09/08/17 0737 09/08/17 1138  GLUCAP 198* 97 269* 126* 244*   D-Dimer No results for input(s): DDIMER in the last 72 hours. Hgb A1c  Recent Labs     09/07/17 0353  HGBA1C 7.2*   Lipid Profile Recent Labs    09/07/17 0353  CHOL 129  HDL 35*  LDLCALC 68  TRIG 128  CHOLHDL 3.7   Thyroid function studies No results for input(s): TSH, T4TOTAL, T3FREE, THYROIDAB in the last 72 hours.  Invalid input(s): FREET3 Anemia work up No results for input(s): VITAMINB12, FOLATE, FERRITIN, TIBC, IRON, RETICCTPCT in the last 72 hours. Urinalysis    Component Value Date/Time   COLORURINE YELLOW 07/17/2014 1021   APPEARANCEUR CLEAR 07/17/2014 1021   LABSPEC 1.018 07/17/2014 1021   PHURINE 5.0 07/17/2014 1021   GLUCOSEU 100 (A) 07/17/2014 1021   HGBUR NEGATIVE 07/17/2014 1021   BILIRUBINUR NEGATIVE 07/17/2014 1021   KETONESUR NEGATIVE 07/17/2014 1021   PROTEINUR NEGATIVE 07/17/2014 1021   UROBILINOGEN 0.2 07/17/2014 1021   NITRITE NEGATIVE 07/17/2014 1021   LEUKOCYTESUR NEGATIVE 07/17/2014 1021   Sepsis Labs Invalid input(s): PROCALCITONIN,  WBC,  LACTICIDVEN Microbiology No results found for this or any previous visit (from the past 240 hour(s)).  Please note: You were cared for by a hospitalist during your [facility] stay. Once you are discharged, your primary care physician will handle any further medical issues. Please note that NO REFILLS for any discharge medications will be authorized once you are discharged, as it is imperative that you return to your primary care physician (or establish a relationship with a primary care physician if you do not have one) for your post [facility] discharge needs so that they can reassess your need for medications and monitor your lab values.    Time coordinating discharge: 40 minutes  SIGNED:   Shelly Coss, MD  [facility] 09/08/2017, 1:12 PM Pager [phone]  If 7PM-7AM, please contact night-coverage [email or web address] Password [email or web address]

## 2017-09-08 NOTE — Care Management Obs Status (Signed)
[facility] NOTIFICATION   Patient Details  Name: Tony [facility]. MRN: [id number] Date of Birth: 03-31-1940   Medicare Observation Status Notification Given:  Yes    Carles Collet, RN 09/08/2017, 10:19 AM

## 2017-09-08 NOTE — Progress Notes (Signed)
STROKE TEAM PROGRESS NOTE   SUBJECTIVE (INTERVAL HISTORY) His wife is at the bedside.  Pt stated that his vision improved. Loop recorder placed in 04/2014 and likely now expired. MRI showed right PCA infarct. Patient has no complaints today. His tolerate ago home. His interested in participating in [place] stroke prevention trial  OBJECTIVE Temp:  [97.8 F (36.6 C)-98.4 F (36.9 C)] 97.8 F (36.6 C) (07/20 0840) Pulse Rate:  [54-69] 63 (07/20 0840) Cardiac Rhythm: Other (Comment) (07/20 0930) Resp:  [10-18] 18 (07/20 0840) BP: (103-137)/(64-84) 128/77 (07/20 0840) SpO2:  [96 %-98 %] 98 % (07/20 0840)  CBC:  Recent Labs  Lab 09/06/17 1943 09/07/17 0353  WBC 7.2 5.4  NEUTROABS 4.7  --   HGB 14.2 12.4*  HCT 43.2 36.4*  MCV 93.7 91.2  PLT 234 353    Basic Metabolic Panel:  Recent Labs  Lab 09/06/17 1943 09/07/17 0353  NA 144 144  K 4.5 4.3  CL 111 113*  CO2 23 23  GLUCOSE 124* 97  BUN 37* 30*  CREATININE 1.71* 1.43*  CALCIUM 10.5* 9.5    Lipid Panel:     Component Value Date/Time   CHOL 129 09/07/2017 0353   TRIG 128 09/07/2017 0353   HDL 35 (L) 09/07/2017 0353   CHOLHDL 3.7 09/07/2017 0353   VLDL 26 09/07/2017 0353   LDLCALC 68 09/07/2017 0353   HgbA1c:  Lab Results  Component Value Date   HGBA1C 7.2 (H) 09/07/2017   Urine Drug Screen:     Component Value Date/Time   LABOPIA NONE DETECTED 09/07/2017 0930   COCAINSCRNUR NONE DETECTED 09/07/2017 0930   LABBENZ NONE DETECTED 09/07/2017 0930   AMPHETMU NONE DETECTED 09/07/2017 0930   THCU NONE DETECTED 09/07/2017 0930   LABBARB (A) 09/07/2017 0930    Result not available. Reagent lot number recalled by manufacturer.    Alcohol Level     Component Value Date/Time   ETH <5 05/19/2014 1958    IMAGING I have personally reviewed the radiological images below and agree with the radiology interpretations.  Ct Angio Head W Or Wo Contrast 09/06/2017 IMPRESSION:   CTA NECK:  1. No hemodynamically  significant stenosis ICA. Patent vertebral arteries.   CTA HEAD:  1.  No emergent large vessel occlusion.  2. Moderate to severe stenosis distal RIGHT P2 segment (fetal origin). Multifocal cerebral artery moderate stenosis compatible with atherosclerosis.  3. Intact 3 mm LEFT PCOM aneurysm. Neuro-Interventional Radiology consultation is suggested to evaluate the appropriateness of potential treatment. Non-emergent evaluation can be arranged by calling [phone] during usual hours.   CT PERFUSION:  1. Small area mesial RIGHT occipital lobe/PCA territory brain at risk.   Ct Head Code Stroke Wo Contrast 09/06/2017 IMPRESSION:  1. No acute intracranial process.  2. ASPECTS is 10.  3. Old small RIGHT frontal/MCA territory infarct.  4. Moderate to severe chronic small vessel ischemic disease. Old small cerebellar infarcts.   Mr Brain Wo Contrast  Result Date: 09/07/2017 CLINICAL DATA:  Acute left eye visual loss.  Prior stroke. EXAM: MRI HEAD WITHOUT CONTRAST TECHNIQUE: Multiplanar, multiecho pulse sequences of the brain and surrounding structures were obtained without intravenous contrast. COMPARISON:  Head CT/CTA/perfusion 09/06/2017 and MRI 05/20/2014 FINDINGS: Brain: There is a small region of confluent gyral restricted diffusion in the medial right occipital lobe consistent with acute infarct. There is also a punctate acute infarct in the right lateral splenium of the corpus callosum. No intracranial hemorrhage, mass, midline shift, or extra-axial fluid collection is  identified. Small chronic infarcts are again noted in the right frontal lobe (MCA territory) and both cerebellar hemispheres. Patchy to confluent T2 hyperintensities in the cerebral white matter bilaterally are nonspecific but compatible with moderate chronic small vessel ischemic disease, similar to the prior MRI. Mild chronic small vessel changes are noted in the pons. There is mild cerebral atrophy. Vascular: Major intracranial  arterial flow voids are preserved. Chronically asymmetric appearance of the left transverse and sigmoid sinus flow voids likely reflects slow flow. Skull and upper cervical spine: Unremarkable bone marrow signal. Sinuses/Orbits: Bilateral cataract extraction. Paranasal sinuses and mastoid air cells are clear. Other: None. IMPRESSION: 1. Small acute right PCA infarct. 2. Moderate chronic small vessel ischemic disease. 3. Chronic right frontal and bilateral cerebellar infarcts. Electronically Signed   By: Logan Bores M.D.   On: 09/07/2017 16:51   Transthoracic Echocardiogram - Left ventricle: The cavity size was normal. There was moderate   concentric hypertrophy. Systolic function was normal. The   estimated ejection fraction was in the range of 60% to 65%. Wall   motion was normal; there were no regional wall motion   abnormalities. Doppler parameters are consistent with abnormal   left ventricular relaxation (grade 1 diastolic dysfunction).   Doppler parameters are consistent with elevated ventricular   end-diastolic filling pressure. - Aortic valve: Trileaflet; normal thickness leaflets. There was no   regurgitation. - Mitral valve: There was mild regurgitation. - Right ventricle: Systolic function was normal. - Right atrium: The atrium was normal in size. - Tricuspid valve: There was mild regurgitation. - Pulmonary arteries: Systolic pressure was within the normal   range. - Inferior vena cava: The vessel was normal in size. - Pericardium, extracardiac: There was no pericardial effusion. Impressions: - No cardiac source of emboli was indentified.   PHYSICAL EXAM  Temp:  [97.8 F (36.6 C)-98.4 F (36.9 C)] 97.8 F (36.6 C) (07/20 0840) Pulse Rate:  [54-69] 63 (07/20 0840) Resp:  [10-18] 18 (07/20 0840) BP: (103-137)/(64-84) 128/77 (07/20 0840) SpO2:  [96 %-98 %] 98 % (07/20 0840)  General - Well nourished, well developed, in no apparent distress.  Ophthalmologic - fundi not  visualized due to noncooperation.  Cardiovascular - Regular rate and rhythm with no murmur.  Mental Status -  Level of arousal and orientation to time, place, and person were intact. Language including expression, naming, repetition, comprehension was assessed and found intact. Fund of Knowledge was assessed and was intact.  Cranial Nerves II - XII - II - Visual field intact OU. III, IV, VI - Extraocular movements intact.no field cut on bedside confrontational testing V - Facial sensation intact bilaterally. VII - Facial movement intact bilaterally. VIII - Hearing & vestibular intact bilaterally. X - Palate elevates symmetrically. XI - Chin turning & shoulder shrug intact bilaterally. XII - Tongue protrusion intact.  Motor Strength - The patient's strength was normal in all extremities and pronator drift was absent.  Bulk was normal and fasciculations were absent.   Motor Tone - Muscle tone was assessed at the neck and appendages and was normal.  Reflexes - The patient's reflexes were symmetrical in all extremities and he had no pathological reflexes.  Sensory - Light touch, temperature/pinprick were assessed and were symmetrical.    Coordination - The patient had normal movements in the hands and feet with no ataxia or dysmetria.  Tremor was absent.  Gait and Station - deferred.   ASSESSMENT/PLAN Tony [facility]. is a 77 y.o. male with history  of previous stroke, paroxysmal supraventricular tachycardia, known lung nodule, hypertension, hyperlipidemia, and diabetes mellitus  presenting with visual field changes. He did not receive IV t-PA due to late presentation.  Stroke: right PCA patchy infarcts, embolic pattern, concerning for undiagnosed afib.but not found on 3 years of long-term cardiac monitoring hence stroke etiology likely cryptogenic  Resultant  Back to baseline  CT head - Small area mesial RIGHT occipital lobe/PCA territory at risk.  MRI head - right PCA patchy  infarct  CTA H&N - Intact 3 mm LEFT PCOM aneurysm. Moderate to severe stenosis distal RIGHT P2 segment   2D Echo - EF 60-65%  Loop recorder afib altered but false alarm after each review  LDL - 68  HgbA1c - 7.2  VTE prophylaxis - Lovenox  aspirin 81 mg daily prior to admission, now on aspirin 325 mg daily. Further antiplatelet or anticoagulation regimen will be decided by Dr. Leonie Man  Patient counseled to be compliant with his antithrombotic medications  Ongoing aggressive stroke risk factor management  Therapy recommendations:  pending  Disposition:  Pending  Hx of stroke  04/2014 left sided weakness, right ACA infarct on MRI. MRA showed right M2 stenosis. CUS and TTE negative. TEE unremarkable, no PFO, loop placed. A1C 7.3 LDL 78. Put on ASA 325mg   Followed with Dr. Leonie Man  Hx of PSVT s/p ablation   Loop recorder always called for afib but denied after cardiology review Given recurrent cryptogenic strokes, with hx of PSVT s/p ablation  May benefit from participation in the [place] trial Hypertension  Stable . Long-term BP goal normotensive  Hyperlipidemia  Lipid lowering medication PTA: Zocor 40 mg daily  LDL 68, goal < 70  Current lipid lowering medication: Lipitor 80 mg daily  Continue statin at discharge  Diabetes  HgbA1c 7.2, goal < 7.0  Uncontrolled  CBG  SSI  Other Stroke Risk Factors  Advanced age  Former cigarette smoker - quit  CAD  PSVT s/p ablation  Other Active Problems  Mild renal insufficiency  Kidney stone  Hospital day # 0 Continue dual antiplatelet therapy for 3 weeks followed by Plavix alone. Consider possible participation in the [place] trial. Patient may be discharged on. Follow-up as an outpatient in stroke clinic. Long discussion with patient and wife and answered questions. Discussed with Dr. Tawanna Solo. Greater than 50% time during this 25 minute visit was spent on counseling and coordination of care about his recurrent  cryptogenic stroke Tony Contras, MD Stroke Neurology 09/08/2017 12:33 PM   To contact Stroke Continuity provider, please refer to [email or web address] After hours, contact General Neurology

## 2017-09-10 ENCOUNTER — Telehealth: Payer: Self-pay | Admitting: *Deleted

## 2017-09-10 NOTE — Telephone Encounter (Signed)
Lm requesting return call to complete TCM and confirm hosp f/u appt  

## 2017-09-11 DIAGNOSIS — E113293 Type 2 diabetes mellitus with mild nonproliferative diabetic retinopathy without macular edema, bilateral: Secondary | ICD-10-CM | POA: Diagnosis not present

## 2017-09-11 DIAGNOSIS — H353131 Nonexudative age-related macular degeneration, bilateral, early dry stage: Secondary | ICD-10-CM | POA: Diagnosis not present

## 2017-09-11 DIAGNOSIS — H43813 Vitreous degeneration, bilateral: Secondary | ICD-10-CM | POA: Diagnosis not present

## 2017-09-11 LAB — HM DIABETES EYE EXAM

## 2017-09-11 NOTE — Telephone Encounter (Signed)
Pt wife called returning the call from the office   Best return number is [phone]

## 2017-09-12 NOTE — Telephone Encounter (Signed)
LM on VM for patient to return our call in regards to hospital follow up.

## 2017-09-18 ENCOUNTER — Encounter: Payer: Self-pay | Admitting: Internal Medicine

## 2017-09-20 ENCOUNTER — Other Ambulatory Visit: Payer: Self-pay

## 2017-09-20 NOTE — Patient Outreach (Signed)
[facility]) Care Management  09/20/2017  Gaudencio Chesnut. 25-Dec-1940 [id number]   EMMI- Stroke RED ON EMMI ALERT Day # 9 Date: 87/31/19 Red Alert Reason:  Feeling worse overall? Yes Questions/problems with meds? Yes  Outreach attempt # 1 male answered stating patient not in.  CM contact information given for return call.   Plan: RN CM will send letter and attempt again within 4 business days.  Jone Baseman, RN, MSN [facility] Care Management Care Management Coordinator Direct Line [phone] Toll Free: [phone]  Fax: [phone]

## 2017-09-21 ENCOUNTER — Other Ambulatory Visit: Payer: Self-pay

## 2017-09-21 NOTE — Patient Outreach (Signed)
[facility]) Care Management  09/21/2017  Tony [facility]. 1940/04/03 [id number]   EMMI- Stroke RED ON EMMI ALERT Day # 9 Date:09/19/17 Red Alert Reason: Feeling worse overall? Yes Questions/problems with meds? Yes  Outreach Attempt: no answer.  HIPAA compliant voice message left.  Plan: RN CM will attempt patient again within 4 business days.  Jone Baseman, RN, MSN [facility] Care Management Coordinator Direct Line [phone] Cell [phone] Toll Free: [phone]  Fax: [phone]

## 2017-09-24 ENCOUNTER — Other Ambulatory Visit: Payer: Self-pay

## 2017-09-24 ENCOUNTER — Ambulatory Visit: Payer: Self-pay

## 2017-09-24 NOTE — Patient Outreach (Signed)
[facility]) Care Management  09/24/2017  Tony [facility]. November 24, 1940 [id number]   EMMI-Stroke RED ON EMMI ALERT Day #9 Date:09/19/17 Red Alert Reason: Feeling worse overall? Yes Questions/problems with meds? Yes  Outreach Attempt: no answer.  HIPAA compliant voice message left.  Plan: RN CM will wait return call.  If no return call will close case.  Jone Baseman, RN, MSN [facility] Care Management Coordinator Direct Line [phone] Cell [phone] Toll Free: [phone]  Fax: [phone]

## 2017-09-25 ENCOUNTER — Ambulatory Visit: Payer: Self-pay

## 2017-10-03 ENCOUNTER — Other Ambulatory Visit: Payer: Self-pay | Admitting: Internal Medicine

## 2017-10-04 ENCOUNTER — Other Ambulatory Visit: Payer: Self-pay

## 2017-10-04 NOTE — Telephone Encounter (Signed)
After playing phone tag for a few weeks, finally able to connect with patient on Tuesday of this week and was able to get him scheduled next week for follow up.  Too late to perform TCM; however, patient states that he is doing well and denies having any questions or concerns at this point.

## 2017-10-04 NOTE — Patient Outreach (Signed)
[facility]) Care Management  10/04/2017  Tony [facility]. 1940-06-30 [id number]   Multiple attempts to establish contact with patient without success. No response from letter mailed to patient.   Plan: RN CM will close case at this time.   Jone Baseman, RN, MSN [facility] Care Management Coordinator Direct Line [phone] Cell [phone] Toll Free: [phone]  Fax: [phone]

## 2017-10-08 ENCOUNTER — Other Ambulatory Visit: Payer: Self-pay | Admitting: Internal Medicine

## 2017-10-08 MED ORDER — CLOPIDOGREL BISULFATE 75 MG PO TABS: 75.0000 mg | ORAL_TABLET | Freq: Every day | ORAL | 11 refills | Status: DC

## 2017-10-08 NOTE — Telephone Encounter (Signed)
Pt has hospital f/u 10/11/17 with Dr Silvio Pate. Plavix 25 mg # 30 on 09/09/17 by Dr Shelly Coss.Please advise.

## 2017-10-08 NOTE — Telephone Encounter (Signed)
See pt. Request. 

## 2017-10-08 NOTE — Telephone Encounter (Signed)
Approved: okay to refill 75mg  x 1 year

## 2017-10-08 NOTE — Telephone Encounter (Signed)
Copied from [place] [id number]. Topic: Quick Communication - Rx Refill/Question >> Oct 08, 2017  9:14 AM Robina Ade, Helene Kelp D wrote: Medication: clopidogrel (PLAVIX) 75 MG tablet  Has the patient contacted their pharmacy? No because ED gave it to him and wants to know if pcp can prescribe it for him. (Agent: If no, request that the patient contact the pharmacy for the refill.) (Agent: If yes, when and what did the pharmacy advise?)  Preferred Pharmacy (with phone number or street name): [id number] - [place], [place]: Please be advised that RX refills may take up to 3 business days. We ask that you follow-up with your pharmacy.

## 2017-10-11 ENCOUNTER — Ambulatory Visit (INDEPENDENT_AMBULATORY_CARE_PROVIDER_SITE_OTHER): Payer: Medicare HMO | Admitting: Internal Medicine

## 2017-10-11 ENCOUNTER — Encounter: Payer: Self-pay | Admitting: Internal Medicine

## 2017-10-11 VITALS — BP 136/70 | HR 81 | Temp 98.1°F | Wt 167.8 lb

## 2017-10-11 DIAGNOSIS — Z8673 Personal history of transient ischemic attack (TIA), and cerebral infarction without residual deficits: Secondary | ICD-10-CM | POA: Diagnosis not present

## 2017-10-11 DIAGNOSIS — I63331 Cerebral infarction due to thrombosis of right posterior cerebral artery: Secondary | ICD-10-CM

## 2017-10-11 NOTE — Assessment & Plan Note (Signed)
Right PCA stroke with transient left eye loss of vision Resolved now plavix added Now on high dose statin BP okay Diabetic control has been good  Lab Results  Component Value Date   HGBA1C 7.2 (H) 09/07/2017   Reviewed all records Counseled on signs of stroke and stressed need for calling 911 if any new neuro symptoms Will be following up with neurology

## 2017-10-11 NOTE — Progress Notes (Signed)
Subjective:    Patient ID: Tony [facility]., male    DOB: March 10, 1940, 77 y.o.   MRN: [id number]  HPI Here for hospital follow up Had cloudiness or loss of vision in left eye Went to eye doctor---said he had "brain problem" and sent to hospital Stroke protocol Reviewed records New right PCA stroke  Vision changes resolved soon after getting to hospital plavix added Statin changed to high dose atorvastatin Does have neurology follow up  Monitoring sugars 90-128 this month Most under 120  No arm or leg weakness No facial droop No aphasia No dysphagia Current Outpatient Medications on File Prior to Visit  Medication Sig Dispense Refill  . amLODipine (NORVASC) 10 MG tablet TAKE 1 TABLET BY MOUTH EVERY DAY 90 tablet 3  . aspirin EC 81 MG tablet Continue for 3 more weeks. Then stop aspirin and take only plavix.    Marland Kitchen atorvastatin (LIPITOR) 80 MG tablet Take 1 tablet (80 mg total) by mouth daily at 6 PM. 30 tablet 0  . clopidogrel (PLAVIX) 75 MG tablet Take 1 tablet (75 mg total) by mouth daily. 30 tablet 11  . glipiZIDE (GLUCOTROL XL) 5 MG 24 hr tablet TAKE 1 TABLET BY MOUTH EVERY DAY WITH BREAKFAST 90 tablet 3  . glucose blood (ONETOUCH VERIO) test strip Use 1 strip daily to check blood sugar Dx Code E11.21 100 each 3  . ketoconazole (NIZORAL) 2 % shampoo APPLY 1 APPLICATION TOPICALLY 2 (TWO) TIMES A WEEK. 120 mL 2  . lisinopril (PRINIVIL,ZESTRIL) 40 MG tablet TAKE 1 TABLET BY MOUTH EVERY DAY 90 tablet 3  . metFORMIN (GLUCOPHAGE) 1000 MG tablet TAKE 1 TABLET BY MOUTH TWICE A DAY WITH A MEAL 180 tablet 3  . mometasone (ELOCON) 0.1 % cream APPLY 1 APPLICATION TOPICALLY 2 (TWO) TIMES DAILY AS NEEDED (SKIN). 45 g 1  . ONETOUCH DELICA LANCETS FINE MISC Use 1 daily to obtain blood sample Dx Code E11.21 100 each 3  . triamcinolone cream (KENALOG) 0.1 % Apply 1 application topically 2 (two) times daily as needed (itching). 30 g 1   No current facility-administered medications on file  prior to visit.     No Known Allergies  Past Medical History:  Diagnosis Date  . Cataract    in both eyes    had cataracts surgically removed  . CVA (cerebral infarction)   . Diabetes mellitus   . ED (erectile dysfunction)   . Elevated PSA   . Frequency of urination   . GERD (gastroesophageal reflux disease)   . History of nephrolithiasis   . Hyperlipidemia   . Hypertension   . Lung nodule   . Psoriasis   . PSVT (paroxysmal supraventricular tachycardia) ([place])   . Stroke [facility])    2015    Past Surgical History:  Procedure Laterality Date  . CATARACT EXTRACTION W/ INTRAOCULAR LENS IMPLANT Right 03/30/12   Dr Kathrin Penner  . COLONOSCOPY    . 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 NM MYOVIEW LTD     normal EF 56% 03/08  . POLYPECTOMY    . PROSTATE BIOPSY  01/05/2012   Procedure: BIOPSY TRANSRECTAL ULTRASONIC PROSTATE (TUBP);  Surgeon: [name], MD;  Location[facility];  Service: Urology;  Laterality: N/A;  . TEE WITHOUT CARDIOVERSION N/A 05/21/2014   Procedure: TRANSESOPHAGEAL ECHOCARDIOGRAM (TEE);  Surgeon: Jerline Pain, MD;  Location: [facility];  Service: Cardiovascular;  Laterality: N/A;  . TONSILLECTOMY  1960  Family History  Problem Relation Age of Onset  . Cancer Mother        breast cancer  . Diabetes Mother   . Colon polyps Brother   . Heart disease Neg Hx   . Colon cancer Neg Hx   . Esophageal cancer Neg Hx   . Rectal cancer Neg Hx   . Stomach cancer Neg Hx     Social History   Socioeconomic History  . Marital status: Married    Spouse name: Peter Congo  . Number of children: 2  . Years of education: HS  . Highest education level: Not on file  Occupational History  . 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 Financial resource strain: Not on file  . Food insecurity:    Worry: Not on file    Inability: Not on file  . Transportation needs:    Medical: Not on file     Non-medical: Not on file  Tobacco Use  . Smoking status: Former Smoker    Packs/day: 1.00    Years: 5.00    Pack years: 5.00    Types: Cigarettes  . Smokeless tobacco: Current User    Types: Chew  . Tobacco comment: QUIT SMOKING CIGARETTES 50 YRS AGO--  CHEWED TOBACCO FOR 40 YRS  Substance and Sexual Activity  . Alcohol use: No    Alcohol/week: 0.0 standard drinks  . Drug use: No  . Sexual activity: Not Currently  Lifestyle  . Physical activity:    Days per week: Not on file    Minutes per session: Not on file  . Stress: Not on file  Relationships  . Social connections:    Talks on phone: Not on file    Gets together: Not on file    Attends religious service: Not on file    Active member of club or organization: Not on file    Attends meetings of clubs or organizations: Not on file    Relationship status: Not on file  . Intimate partner violence:    Fear of current or ex partner: Not on file    Emotionally abused: Not on file    Physically abused: Not on file    Forced sexual activity: Not on file  Other Topics Concern  . Not on file  Social History Narrative   Has living will   Requests wife as health care POA.   Would accept resuscitation attempts   Not sure about tube feeds   Review of Systems  Sleeping well Appetite is good Bowels fine No urinary changes--slow stream at first sometimes     Objective:   Physical Exam  Constitutional: He is oriented to person, place, and time. He appears well-developed. No distress.  Neck: No thyromegaly present.  Cardiovascular: Normal rate, regular rhythm and normal heart sounds. Exam reveals no gallop.  No murmur heard. Respiratory: Effort normal and breath sounds normal. No respiratory distress. He has no wheezes. He has no rales.  Musculoskeletal: He exhibits no edema.  Lymphadenopathy:    He has no cervical adenopathy.  Neurological: He is oriented to person, place, and time. He has normal strength. He displays no  tremor. No cranial nerve deficit. He exhibits normal muscle tone. He displays a negative Romberg sign. Coordination and gait normal.  Psychiatric: He has a normal mood and affect. His behavior is normal.           Assessment & Plan:

## 2017-10-13 ENCOUNTER — Other Ambulatory Visit: Payer: Self-pay | Admitting: Internal Medicine

## 2017-10-18 ENCOUNTER — Encounter: Payer: Self-pay | Admitting: Neurology

## 2017-10-18 ENCOUNTER — Ambulatory Visit: Payer: Medicare HMO | Admitting: Neurology

## 2017-10-18 VITALS — BP 145/73 | HR 73 | Ht 68.0 in | Wt 169.0 lb

## 2017-10-18 DIAGNOSIS — I639 Cerebral infarction, unspecified: Secondary | ICD-10-CM

## 2017-10-18 NOTE — Progress Notes (Signed)
[facility] [address] [place]. [place] [address] [phone]       OFFICE FOLLOW-UP NOTE  [name]. Date of Birth:  [birth year] Medical Record Number:  [id number]   HPI: Tony Jackson is a pleasant 89 year Caucasian male seen for initial office follow-up visit following hospital admission for stroke in July 2019. His accompanied by his wife and son. History is obtained from them and review of electronic medical records. I have personally reviewed imaging films.HPI ( Dr Rory Jackson ) Tony Jackson. is a 77 y.o. male past medical history of PSVT status post ablation, old stroke with no residual deficit, diabetes, hypertension, hyperlipidemia, PSVT, presented to his ophthalmologist office for evaluation of visual field loss.According to both him and his wife were at bedside, he was last normal at noon on 09/06/2017 and they were working outside in the yard in the heat.  As he came inside the house he said he is not feeling right.  It was not until 1 PM that he realized that he is not able to see on the left lower quadrant.  He thought it is from his left eye.  He went to the ophthalmologist.  An examination was done by the ophthalmologist and concern for left homonymous quadrantanopsia and he was sent to the emergency room via private vehicle.His wife drove him to the ER.  He arrived at the ER around 5 PM-please refer to the nursing charting times for exact times. He was seen at the ER bridge.nitial NIH stroke scale 1 for visual.Taken in for a stat noncontrast CT of the head that did not show any bleed.  It showed an old right MCA stroke and small vessel disease. CT angiogram head and neck was ordered along with CT perfusion study.Not a candidate for TPA due to being outside the window. LKW: 12 PM on 09/06/2017 tpa given?: no, outside the window Premorbid modified Rankin scale (mRS): 1  CT angiogram of the brain shows moderate to severe narrowing of the right P2 segment  multifocal moderate stenosis compatible with atherosclerosis. There is a 3 mm left posterior communicating artery aneurysm noted. CT perfusio scan showed small area of mesial right occipital lobe at risk. CT but no acute abnormality. MRI scan of the brainconfirmed a small acute right PCA infarct with old right frontal and bilateral cerebellar embolic infarcts. Transthoracic echo showed normal ejection fraction without cardiac source of embolism.LDL cholesterol was 68 mg percent. Hemoglobin A1c was 7.2. Patient had a prior history of right ACA and right MCA branch infarcts in March 2016 at that time carotid ultrasound and transthoracic echo were also negative transesophageal echocardiogram was unremarkable without evidence of PFO. Patient had loop recorder inserted at that time but no paroxysmal A. Fib was found during 3 is a monitoring and hence it was removed.patient has history of supraventricular tachycardia for which is undergone ablation and there have been no recurrent cardiac arrhythmias.. Asian states his amylase and discharged. He feels that his peripheral vision on the left side improved within a few days. He has been driving without incidence. He is starting aspirin and Plavix well with only minor bruising and no bleeding episodes. He is tolerating Lipitor without muscle aches and pains. He states his sugars under good control. His blood pressure is well controlled and today it is borderline at 145/73. Patient is interested in considering partis patient Remi Haggard trial for stroke prevention.  ROS:   14 system review of systems is positive for  Cough and memory loss only and all other systems negative  PMH:  Past Medical History:  Diagnosis Date  . Cataract    in both eyes    had cataracts surgically removed  . CVA (cerebral infarction)   . Diabetes mellitus   . ED (erectile dysfunction)   . Elevated PSA   . Frequency of urination   . GERD (gastroesophageal reflux disease)   . History of  nephrolithiasis   . Hyperlipidemia   . Hypertension   . Lung nodule   . Psoriasis   . PSVT (paroxysmal supraventricular tachycardia) ([place])   . Stroke [facility])    2015    Social History:  Social History   Socioeconomic History  . Marital status: Married    Spouse name: Peter Congo  . Number of children: 2  . Years of education: HS  . Highest education level: Not on file  Occupational History  . 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 Financial resource strain: Not on file  . Food insecurity:    Worry: Not on file    Inability: Not on file  . Transportation needs:    Medical: Not on file    Non-medical: Not on file  Tobacco Use  . Smoking status: Former Smoker    Packs/day: 1.00    Years: 5.00    Pack years: 5.00    Types: Cigarettes  . Smokeless tobacco: Current User    Types: Chew  . Tobacco comment: QUIT SMOKING CIGARETTES 50 YRS AGO--  CHEWED TOBACCO FOR 40 YRS  Substance and Sexual Activity  . Alcohol use: No    Alcohol/week: 0.0 standard drinks  . Drug use: No  . Sexual activity: Not Currently  Lifestyle  . Physical activity:    Days per week: Not on file    Minutes per session: Not on file  . Stress: Not on file  Relationships  . Social connections:    Talks on phone: Not on file    Gets together: Not on file    Attends religious service: Not on file    Active member of club or organization: Not on file    Attends meetings of clubs or organizations: Not on file    Relationship status: Not on file  . Intimate partner violence:    Fear of current or ex partner: Not on file    Emotionally abused: Not on file    Physically abused: Not on file    Forced sexual activity: Not on file  Other Topics Concern  . Not on file  Social History Narrative   Has living will   Requests wife as health care POA.   Would accept resuscitation attempts   Not sure about tube feeds      Lives at home with his wife   Right handed    Caffeine:  occasional     Medications:   Current Outpatient Medications on File Prior to Visit  Medication Sig Dispense Refill  . amLODipine (NORVASC) 10 MG tablet TAKE 1 TABLET BY MOUTH EVERY DAY 90 tablet 3  . atorvastatin (LIPITOR) 80 MG tablet TAKE 1 TABLET BY MOUTH EVERY EVENING AT 6 PM 30 tablet 5  . glipiZIDE (GLUCOTROL XL) 5 MG 24 hr tablet TAKE 1 TABLET BY MOUTH EVERY DAY WITH BREAKFAST 90 tablet 3  . ketoconazole (NIZORAL) 2 % shampoo APPLY 1 APPLICATION TOPICALLY 2 (TWO) TIMES A WEEK. 120 mL 2  . lisinopril (PRINIVIL,ZESTRIL) 40 MG tablet TAKE  1 TABLET BY MOUTH EVERY DAY 90 tablet 3  . metFORMIN (GLUCOPHAGE) 1000 MG tablet TAKE 1 TABLET BY MOUTH TWICE A DAY WITH A MEAL 180 tablet 3  . mometasone (ELOCON) 0.1 % cream APPLY 1 APPLICATION TOPICALLY 2 (TWO) TIMES DAILY AS NEEDED (SKIN). 45 g 1  . triamcinolone cream (KENALOG) 0.1 % Apply 1 application topically 2 (two) times daily as needed (itching). 30 g 1  . aspirin EC 81 MG tablet Continue for 3 more weeks. Then stop aspirin and take only plavix. (Patient not taking: Reported on 10/18/2017)    . glucose blood (ONETOUCH VERIO) test strip Use 1 strip daily to check blood sugar Dx Code E11.21 100 each 3  . ONETOUCH DELICA LANCETS FINE MISC Use 1 daily to obtain blood sample Dx Code E11.21 100 each 3   No current facility-administered medications on file prior to visit.     Allergies:  No Known Allergies  Physical Exam General: frail elderly Caucasian male seated, in no evident distress Head: head normocephalic and atraumatic.  Neck: supple with no carotid or supraclavicular bruits Cardiovascular: regular rate and rhythm, no murmurs Musculoskeletal: no deformity Skin:  no rash/petichiae Vascular:  Normal pulses all extremities Vitals:   10/18/17 1303  BP: (!) 145/73  Pulse: 73   Neurologic Exam Mental Status: Awake and fully alert. Oriented to place and time. Recent and remote memory intact. Attention span, concentration and fund of  knowledge appropriate. Mood and affect appropriate.  Cranial Nerves: Fundoscopic exam reveals sharp disc margins. Pupils equal, briskly reactive to light. Extraocular movements full without nystagmus. Visual fields full so partial left inferior quadrantanopsia to confrontation testing. Hearing intact. Facial sensation intact. Face, tongue, palate moves normally and symmetrically.  Motor: Normal bulk and tone. Normal strength in all tested extremity muscles. Sensory.: intact to touch ,pinprick .position and vibratory sensation.  Coordination: Rapid alternating movements normal in all extremities. Finger-to-nose and heel-to-shin performed accurately bilaterally. Gait and Station: Arises from chair without difficulty. Stance is normal. Gait demonstrates normal stride length and balance . Able to heel, toe and tandem walk without difficulty.  Reflexes: 1+ and symmetric. Toes downgoing.   NIHSS  1 Modified Rankin  0  ASSESSMENT:  77 year old Caucasian male with right PCA embolic infarct in July 0272 of cryptogenic etiology.remote history of right frontal MCA branch infarct in March 2016 negative neurovascular workup as well as 3 year remote cardiac monitoring with loop recorder     PLAN: I had a long d/w patient, his wife and son about his recent cryptogenic stroke, risk for recurrent stroke/TIAs, personally independently reviewed imaging studies and stroke evaluation results and answered questions.Continue  Aspirin 81 mg and discontinue Plavix for secondary stroke prevention and maintain strict control of hypertension with blood pressure goal below 130/90, diabetes with hemoglobin A1c goal below 6.5% and lipids with LDL cholesterol goal below 70 mg/dL. I also advised the patient to eat a healthy diet with plenty of whole grains, cereals, fruits and vegetables, exercise regularly and maintain ideal body weight . Patient is interested in participating in the [place] stroke prevention trial and he was  given information to read and decide and screen is willing.Followup in the future with me in 6 months or call earlier if necessary Greater than 50% of time during this 25 minute visit was spent on counseling,explanation of diagnosis, planning of further management, discussion with patient and family and coordination of care  Note: This document was prepared with digital dictation and possible smart phrase  technology. Any transcriptional errors that result from this process are unintentional

## 2017-10-18 NOTE — Patient Instructions (Signed)
I had a long d/w patient, his wife and son about his recent cryptogenic stroke, risk for recurrent stroke/TIAs, personally independently reviewed imaging studies and stroke evaluation results and answered questions.Continue  Aspirin 81 mg and discontinue Plavix for secondary stroke prevention and maintain strict control of hypertension with blood pressure goal below 130/90, diabetes with hemoglobin A1c goal below 6.5% and lipids with LDL cholesterol goal below 70 mg/dL. I also advised the patient to eat a healthy diet with plenty of whole grains, cereals, fruits and vegetables, exercise regularly and maintain ideal body weight . Patient is interested in participating in the [place] stroke prevention trial and he was given information to read and decide and screen is willing.Followup in the future with me in 6 months or call earlier if necessary   Stroke Prevention Some medical conditions and behaviors are associated with a higher chance of having a stroke. You can help prevent a stroke by making nutrition, lifestyle, and other changes, including managing any medical conditions you may have. What nutrition changes can be made?  Eat healthy foods. You can do this by: ? Choosing foods high in fiber, such as fresh fruits and vegetables and whole grains. ? Eating at least 5 or more servings of fruits and vegetables a day. Try to fill half of your plate at each meal with fruits and vegetables. ? Choosing lean protein foods, such as lean cuts of meat, poultry without skin, fish, tofu, beans, and nuts. ? Eating low-fat dairy products. ? Avoiding foods that are high in salt (sodium). This can help lower blood pressure. ? Avoiding foods that have saturated fat, trans fat, and cholesterol. This can help prevent high cholesterol. ? Avoiding processed and premade foods.  Follow your health care provider's specific guidelines for losing weight, controlling high blood pressure (hypertension), lowering high  cholesterol, and managing diabetes. These may include: ? Reducing your daily calorie intake. ? Limiting your daily sodium intake to 1,500 milligrams (mg). ? Using only healthy fats for cooking, such as olive oil, canola oil, or sunflower oil. ? Counting your daily carbohydrate intake. What lifestyle changes can be made?  Maintain a healthy weight. Talk to your health care provider about your ideal weight.  Get at least 30 minutes of moderate physical activity at least 5 days a week. Moderate activity includes brisk walking, biking, and swimming.  Do not use any products that contain nicotine or tobacco, such as cigarettes and e-cigarettes. If you need help quitting, ask your health care provider. It may also be helpful to avoid exposure to secondhand smoke.  Limit alcohol intake to no more than 1 drink a day for nonpregnant women and 2 drinks a day for men. One drink equals 12 oz of beer, 5 oz of wine, or 1 oz of hard liquor.  Stop any illegal drug use.  Avoid taking birth control pills. Talk to your health care provider about the risks of taking birth control pills if: ? You are over 98 years old. ? You smoke. ? You get migraines. ? You have ever had a blood clot. What other changes can be made?  Manage your cholesterol levels. ? Eating a healthy diet is important for preventing high cholesterol. If cholesterol cannot be managed through diet alone, you may also need to take medicines. ? Take any prescribed medicines to control your cholesterol as told by your health care provider.  Manage your diabetes. ? Eating a healthy diet and exercising regularly are important parts of managing your blood  sugar. If your blood sugar cannot be managed through diet and exercise, you may need to take medicines. ? Take any prescribed medicines to control your diabetes as told by your health care provider.  Control your hypertension. ? To reduce your risk of stroke, try to keep your blood pressure  below 130/80. ? Eating a healthy diet and exercising regularly are an important part of controlling your blood pressure. If your blood pressure cannot be managed through diet and exercise, you may need to take medicines. ? Take any prescribed medicines to control hypertension as told by your health care provider. ? Ask your health care provider if you should monitor your blood pressure at home. ? Have your blood pressure checked every year, even if your blood pressure is normal. Blood pressure increases with age and some medical conditions.  Get evaluated for sleep disorders (sleep apnea). Talk to your health care provider about getting a sleep evaluation if you snore a lot or have excessive sleepiness.  Take over-the-counter and prescription medicines only as told by your health care provider. Aspirin or blood thinners (antiplatelets or anticoagulants) may be recommended to reduce your risk of forming blood clots that can lead to stroke.  Make sure that any other medical conditions you have, such as atrial fibrillation or atherosclerosis, are managed. What are the warning signs of a stroke? The warning signs of a stroke can be easily remembered as BEFAST.  B is for balance. Signs include: ? Dizziness. ? Loss of balance or coordination. ? Sudden trouble walking.  E is for eyes. Signs include: ? A sudden change in vision. ? Trouble seeing.  F is for face. Signs include: ? Sudden weakness or numbness of the face. ? The face or eyelid drooping to one side.  A is for arms. Signs include: ? Sudden weakness or numbness of the arm, usually on one side of the body.  S is for speech. Signs include: ? Trouble speaking (aphasia). ? Trouble understanding.  T is for time. ? These symptoms may represent a serious problem that is an emergency. Do not wait to see if the symptoms will go away. Get medical help right away. Call your local emergency services (911 in the [place] Do not drive yourself  to the hospital.  Other signs of stroke may include: ? A sudden, severe headache with no known cause. ? Nausea or vomiting. ? Seizure.  Where to find more information: For more information, visit:  American Stroke Association: www.strokeassociation.org  National Stroke Association: www.stroke.org  Summary  You can prevent a stroke by eating healthy, exercising, not smoking, limiting alcohol intake, and managing any medical conditions you may have.  Do not use any products that contain nicotine or tobacco, such as cigarettes and e-cigarettes. If you need help quitting, ask your health care provider. It may also be helpful to avoid exposure to secondhand smoke.  Remember BEFAST for warning signs of stroke. Get help right away if you or a loved one has any of these signs. This information is not intended to replace advice given to you by your health care provider. Make sure you discuss any questions you have with your health care provider. Document Released: 03/16/2004 Document Revised: 03/14/2016 Document Reviewed: 03/14/2016 Elsevier Interactive Patient Education  [employer].

## 2017-10-23 ENCOUNTER — Ambulatory Visit: Payer: Medicare HMO | Admitting: Internal Medicine

## 2017-10-23 ENCOUNTER — Encounter: Payer: Self-pay | Admitting: Internal Medicine

## 2017-10-23 VITALS — BP 140/80 | HR 70 | Ht 68.0 in | Wt 161.6 lb

## 2017-10-23 DIAGNOSIS — E559 Vitamin D deficiency, unspecified: Secondary | ICD-10-CM | POA: Diagnosis not present

## 2017-10-23 DIAGNOSIS — E213 Hyperparathyroidism, unspecified: Secondary | ICD-10-CM | POA: Diagnosis not present

## 2017-10-23 LAB — BASIC METABOLIC PANEL WITH GFR
BUN/Creatinine Ratio: 19 (calc) (ref 6–22)
BUN: 27 mg/dL — ABNORMAL HIGH (ref 7–25)
CALCIUM: 11 mg/dL — AB (ref 8.6–10.3)
CO2: 28 mmol/L (ref 20–32)
Chloride: 106 mmol/L (ref 98–110)
Creat: 1.41 mg/dL — ABNORMAL HIGH (ref 0.70–1.18)
GFR, EST NON AFRICAN AMERICAN: 48 mL/min/{1.73_m2} — AB (ref >=60)
GFR, Est African American: 56 mL/min/{1.73_m2} — ABNORMAL LOW (ref >=60)
GLUCOSE: 143 mg/dL — AB (ref 65–99)
POTASSIUM: 5.6 mmol/L — AB (ref 3.5–5.3)
SODIUM: 142 mmol/L (ref 135–146)

## 2017-10-23 LAB — PHOSPHORUS: PHOSPHORUS: 3.2 mg/dL (ref 2.3–4.6)

## 2017-10-23 LAB — MAGNESIUM: Magnesium: 2.1 mg/dL (ref 1.5–2.5)

## 2017-10-23 NOTE — Patient Instructions (Signed)
Please stop at the lab.  Continue vitamin D 4000 units daily.  Please come back for a follow-up appointment in 6 months  Patient information (Up-to-Date): Collection of a 24-hour urine specimen  - You should collect every drop of urine during each 24-hour period. It does not matter how much or little urine is passed each time, as long as every drop is collected. - Begin the urine collection in the morning after you wake up, after you have emptied your bladder for the first time. - Urinate (empty the bladder) for the first time and flush it down the toilet. Note the exact time (eg, 6:15 AM). You will begin the urine collection at this time. - Collect every drop of urine during the day and night in an empty collection bottle. Store the bottle at room temperature or in the refrigerator. - If you need to have a bowel movement, any urine passed with the bowel movement should be collected. Try not to include feces with the urine collection. If feces does get mixed in, do not try to remove the feces from the urine collection bottle. - Finish by collecting the first urine passed the next morning, adding it to the collection bottle. This should be within ten minutes before or after the time of the first morning void on the first day (which was flushed). In this example, you would try to void between 6:05 and 6:25 on the second day. - If you need to urinate one hour before the final collection time, drink a full glass of water so that you can void again at the appropriate time. If you have to urinate 20 minutes before, try to hold the urine until the proper time. - Please note the exact time of the final collection, even if it is not the same time as when collection began on day 1. - The bottle(s) may be kept at room temperature for a day or two, but should be kept cool or refrigerated for longer periods of time.

## 2017-10-23 NOTE — Progress Notes (Addendum)
Patient ID: Tony Jackson., male   DOB: 26-Jun-1940, 77 y.o.   MRN: [id number]    HPI  Tony Jackson. is a 77 y.o.-year-old male, referred by his PCP, Dr. Silvio Pate, for evaluation for hypercalcemia/hyperparathyroidism.  He is here with his wife offers part of the history, especially regarding his medical history and recent labs.  Since last visit, patient had a right PCA stroke in 08/2017.  He had loss of vision in L eye after the stroke >> resolved. Started Plavix.  Pt was dx with hypercalcemia in 2017 per review of the chart, as patient was not aware of the problem.  He was referred to see me in 05/2017.  At that time, his vitamin D was found to be very low and we started him on vitamin D supplementation.  After normalization of his vitamin D, a PTH and a calcium level returned normal.  However, he had one calcium level that was slightly high a month later.  On a subsequent check, this normalized.  I reviewed pt's pertinent labs: Lab Results  Component Value Date   PTH 61 08/16/2017   PTH 103 (H) 05/09/2017   CALCIUM 9.5 09/07/2017   CALCIUM 10.5 (H) 09/06/2017   CALCIUM 10.1 08/16/2017   CALCIUM 11.1 (H) 05/09/2017   CALCIUM 10.0 05/17/2016   CALCIUM 10.4 02/18/2016   CALCIUM 10.7 (H) 12/21/2015   CALCIUM 10.5 07/28/2015   CALCIUM 10.1 01/27/2015   CALCIUM 10.1 09/21/2014  08/04/2016: Magnesium 2.3, phosphorus 3.9, both normal; calcium 9.2  No history of osteoporosis.  No fractures. H/o few falls (stumbles  -had a stroke ~2015)  He has a history of kidney stones -one episode in 2014.  He denies abdominal pain, constipation, diffuse joint pains.  He has CKD due to diabetes. Last BUN/Cr: Lab Results  Component Value Date   BUN 30 (H) 09/07/2017   BUN 37 (H) 09/06/2017   CREATININE 1.43 (H) 09/07/2017   CREATININE 1.71 (H) 09/06/2017   He has a history of hypertension, but is not on HCTZ.  Vitamin D deficiency:  We started vit D 4000 IU at last visit.  Reviewed vit D  levels: Lab Results  Component Value Date   VD25OH 39.93 08/16/2017   VD25OH 19.01 (L) 05/09/2017   No FH of hypercalcemia, pituitary tumors, thyroid cancer, or osteoporosis.   He also has HL, HTN, DM2: Last HbA1c: Lab Results  Component Value Date   HGBA1C 7.2 (H) 09/07/2017   ROS: Constitutional: no weight gain/no weight loss, no fatigue, no subjective hyperthermia, no subjective hypothermia Eyes: + Resolved blurry vision, no xerophthalmia ENT: no sore throat, no nodules palpated in throat, no dysphagia, no odynophagia, no hoarseness Cardiovascular: no CP/no SOB/no palpitations/no leg swelling Respiratory: no cough/no SOB/no wheezing Gastrointestinal: no N/no V/no D/no C/no acid reflux Musculoskeletal: no muscle aches/no joint aches Skin: no rashes, no hair loss Neurological: no tremors/no numbness/no tingling/no dizziness + difficulty with erections, + Low libido  I reviewed pt's medications, allergies, PMH, social hx, family hx, and changes were documented in the history of present illness. Otherwise, unchanged from my initial visit note.  Past Medical History:  Diagnosis Date  . Cataract    in both eyes    had cataracts surgically removed  . CVA (cerebral infarction)   . Diabetes mellitus   . ED (erectile dysfunction)   . Elevated PSA   . Frequency of urination   . GERD (gastroesophageal reflux disease)   . History of nephrolithiasis   .  Hyperlipidemia   . Hypertension   . Lung nodule   . Psoriasis   . PSVT (paroxysmal supraventricular tachycardia) ([place])   . Stroke [facility])    2015   Past Surgical History:  Procedure Laterality Date  . CATARACT EXTRACTION W/ INTRAOCULAR LENS IMPLANT Right 03/30/12   Dr Kathrin Penner  . COLONOSCOPY     most recent 2019  . 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 NM MYOVIEW LTD     normal EF 56% 03/08  . POLYPECTOMY    . PROSTATE BIOPSY  01/05/2012   Procedure: BIOPSY TRANSRECTAL ULTRASONIC PROSTATE  (TUBP);  Surgeon: [name], MD;  Location: [facility];  Service: Urology;  Laterality: N/A;  . TEE WITHOUT CARDIOVERSION N/A 05/21/2014   Procedure: TRANSESOPHAGEAL ECHOCARDIOGRAM (TEE);  Surgeon: Jerline Pain, MD;  Location: [facility];  Service: Cardiovascular;  Laterality: N/A;  . TONSILLECTOMY  1960   Social History   Socioeconomic History  . Marital status: Married    Spouse name: Peter Congo  . Number of children: 2  . Years of education: HS  . Highest education level: Not on file  Occupational History  . 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 Financial resource strain: Not on file  . Food insecurity:    Worry: Not on file    Inability: Not on file  . Transportation needs:    Medical: Not on file    Non-medical: Not on file  Tobacco Use  . Smoking status: Former Smoker    Packs/day: 1.00    Years: 5.00    Pack years: 5.00    Types: Cigarettes  . Smokeless tobacco: Current User    Types: Chew  . Tobacco comment: QUIT SMOKING CIGARETTES 50 YRS AGO--  CHEWED TOBACCO FOR 40 YRS  Substance and Sexual Activity  . Alcohol use: No    Alcohol/week: 0.0 standard drinks  . Drug use: No  . Sexual activity: Not Currently  Lifestyle  . Physical activity:    Days per week: Not on file    Minutes per session: Not on file  . Stress: Not on file  Relationships  . Social connections:    Talks on phone: Not on file    Gets together: Not on file    Attends religious service: Not on file    Active member of club or organization: Not on file    Attends meetings of clubs or organizations: Not on file    Relationship status: Not on file  . Intimate partner violence:    Fear of current or ex partner: Not on file    Emotionally abused: Not on file    Physically abused: Not on file    Forced sexual activity: Not on file  Other Topics Concern  . Not on file  Social History Narrative   Has living will   Requests wife as health care  POA.   Would accept resuscitation attempts   Not sure about tube feeds      Lives at home with his wife   Right handed    Caffeine: occasional    Current Outpatient Medications on File Prior to Visit  Medication Sig Dispense Refill  . amLODipine (NORVASC) 10 MG tablet TAKE 1 TABLET BY MOUTH EVERY DAY 90 tablet 3  . aspirin EC 81 MG tablet Continue for 3 more weeks. Then stop aspirin and take only plavix. (Patient not taking: Reported on 10/18/2017)    .  atorvastatin (LIPITOR) 80 MG tablet TAKE 1 TABLET BY MOUTH EVERY EVENING AT 6 PM 30 tablet 5  . glipiZIDE (GLUCOTROL XL) 5 MG 24 hr tablet TAKE 1 TABLET BY MOUTH EVERY DAY WITH BREAKFAST 90 tablet 3  . glucose blood (ONETOUCH VERIO) test strip Use 1 strip daily to check blood sugar Dx Code E11.21 100 each 3  . ketoconazole (NIZORAL) 2 % shampoo APPLY 1 APPLICATION TOPICALLY 2 (TWO) TIMES A WEEK. 120 mL 2  . lisinopril (PRINIVIL,ZESTRIL) 40 MG tablet TAKE 1 TABLET BY MOUTH EVERY DAY 90 tablet 3  . metFORMIN (GLUCOPHAGE) 1000 MG tablet TAKE 1 TABLET BY MOUTH TWICE A DAY WITH A MEAL 180 tablet 3  . mometasone (ELOCON) 0.1 % cream APPLY 1 APPLICATION TOPICALLY 2 (TWO) TIMES DAILY AS NEEDED (SKIN). 45 g 1  . ONETOUCH DELICA LANCETS FINE MISC Use 1 daily to obtain blood sample Dx Code E11.21 100 each 3  . triamcinolone cream (KENALOG) 0.1 % Apply 1 application topically 2 (two) times daily as needed (itching). 30 g 1   No current facility-administered medications on file prior to visit.    No Known Allergies Family History  Problem Relation Age of Onset  . Cancer Mother        breast cancer  . Diabetes Mother   . Colon polyps Brother   . Heart disease Neg Hx   . Colon cancer Neg Hx   . Esophageal cancer Neg Hx   . Rectal cancer Neg Hx   . Stomach cancer Neg Hx     PE: BP 140/80   Pulse 70   Ht 5\' 8"  (1.727 m)   Wt 161 lb 9.6 oz (73.3 kg)   SpO2 97%   BMI 24.57 kg/m  Wt Readings from Last 3 Encounters:  10/23/17 161 lb 9.6 oz  (73.3 kg)  10/18/17 169 lb (76.7 kg)  10/11/17 167 lb 12 oz (76.1 kg)   Constitutional: normal weight, in NAD Eyes: PERRLA, EOMI, no exophthalmos ENT: moist mucous membranes, no thyromegaly, no cervical lymphadenopathy Cardiovascular: RRR, No MRG Respiratory: CTA B Gastrointestinal: abdomen soft, NT, ND, BS+ Musculoskeletal: no deformities, strength intact in all 4 Skin: moist, warm, no rashes Neurological: no tremor with outstretched hands, DTR normal in all 4  Assessment: 1. Hypercalcemia/hyperparathyroidism  2. Vitamin D def  Plan: Patient has had several instances of elevated calcium, with the highest level being at 11.2003/2019.  A corresponding intact PTH was also high, at 103.  However, at the same time, his vitamin D was very low, at 64. -He does not have a history of osteoporosis, fractures, but he does have a history of nephrolithiasis- 1 episode.  No abdominal pain, depression, bone pain. -After last visit, after repeating his vitamin D, his PTH and calcium returned to normal.  However, he had a slightly elevated calcium level at the last check by PCP in 08/2017.  A subsequent level was normal in the same month. -At this visit, we discussed about the possibility that he may still have primary hyperparathyroidism, however, this is probably very mild.  We discussed above and just following him for now with repeated calcium and PTH levels and making sure that her vitamin D remains normal.  We did discuss about the possibility of parathyroidectomy, also, which is an option for him.  We decided to proceed with further investigation for his hyperparathyroidism to include: Magnesium Phosphorus Calcium and intact PTH by the [facility] assay 24h urinary calcium/creatinine ratio -given instructions for urine collection -  We again discussed about possible consequences of hyperparathyroidism: Approximately a third of the patients will develop complications over 15 years (OP, nephrolithiasis).   -Criteria for parathyroid surgery are: Increased calcium by more than 1 mg/dL above the upper limit of normal  Kidney ds.  Osteoporosis (or Vb fx) Age <33 years old Newer criteria (2013): High UCa >400 mg/d and increased stone risk by biochemical stone risk analysis Presence of nephrolithiasis or nephrocalcinosis Pt's preference - We may need to check a DEXA scan to see if he has osteoporosis (in this case, will need a 33% distal radius for evaluation of cortical bone, which is predominantly affected by hyperparathyroidism).  - I will see the patient back in 6 months, but will go ahead with the investigation now  2. Vitamin D deficiency  -Reviewed together with patient and his wife his latest vitamin D level, which normalized after we started supplementation -He continues on 4000 units vitamin D daily  Component     Latest Ref Rng & Units 10/23/2017          Glucose     65 - 99 mg/dL 143 (H)  BUN     7 - 25 mg/dL 27 (H)  Creatinine     0.70 - 1.18 mg/dL 1.41 (H)  GFR, Est Non African American     > OR = 60 mL/min/1.13m2 48 (L)  GFR, Est African American     > OR = 60 mL/min/1.15m2 56 (L)  BUN/Creatinine Ratio     6 - 22 (calc) 19  Sodium     135 - 146 mmol/L 142  Potassium     3.5 - 5.3 mmol/L 5.6 (H)  Chloride     98 - 110 mmol/L 106  CO2     20 - 32 mmol/L 28  Calcium     8.6 - 10.3 mg/dL 11.0 (H)  Phosphorus     2.3 - 4.6 mg/dL 3.2  PTH, Intact     15 - 65 pg/mL 45  Magnesium     1.5 - 2.5 mg/dL 2.1    Calcium elevated, PTH unsuppressed. Phosphorus and magnesium normal.  Potassium high >> will advise to stop Lisinopril. Will likely need additional medication for BP control. Will FWD to PCP, also, and advised the pt to contact him for further instruction Re: BP control. He also needs a new BMP in few days.  Component     Latest Ref Rng & Units 11/01/2017  Creatinine, 24H Ur     0.50 - 2.15 g/24 h 1.11  Calcium, 24H Urine     mg/24 h 56   FE Ca is normal,  at 0.06.  My suspicion is still for primary hyperparathyroidism.  I will first send him for a technetium sestamibi scan.  Addendum: Reading Physician Reading Date Result Priority  Lavonia Dana, MD 12/11/2017     Narrative    CLINICAL DATA: Hyperparathyroidism, hypercalcemia  EXAM: NM PARATHYROID SCINTIGRAPHY AND SPECT IMAGING  TECHNIQUE: Following intravenous administration of radiopharmaceutical, early and 2-hour delayed planar images were obtained in the anterior projection. Delayed triplanar SPECT images were also obtained at 2 hours.  RADIOPHARMACEUTICALS: 25 mCi Tc-42m Sestamibi IV  COMPARISON: None  FINDINGS: Planar imaging: Normal initial distribution of sestamibi within the thyroid lobes. Normal washout of tracer on delayed image at 2 hours. Questionable subtle area of abnormal sestamibi retention at the LEFT inferior parathyroid gland. No ectopic localization of tracer within the mediastinum.  SPECT imaging: Focus of abnormal sestamibi retention at  the expected position of the LEFT inferior parathyroid gland suspicious for a parathyroid adenoma. No additional retention of tracer at the expected positions of the remaining parathyroid glands. No ectopic localization of tracer within the mediastinum.  IMPRESSION: Abnormal sestamibi retention at the expected position of the LEFT inferior parathyroid gland suspicious for a parathyroid adenoma.   Electronically Signed By: Lavonia Dana M.D. On: 12/11/2017 14:34   Will refer to Dr. Harlow Asa for parathyroidectomy.  Philemon Kingdom, MD PhD [facility]

## 2017-10-24 LAB — PARATHYROID HORMONE, INTACT (NO CA): PTH: 45 pg/mL (ref 15–65)

## 2017-10-26 ENCOUNTER — Other Ambulatory Visit: Payer: Self-pay | Admitting: Neurology

## 2017-10-26 ENCOUNTER — Telehealth: Payer: Self-pay | Admitting: *Deleted

## 2017-10-26 DIAGNOSIS — E875 Hyperkalemia: Secondary | ICD-10-CM

## 2017-10-26 MED ORDER — CLOPIDOGREL BISULFATE 75 MG PO TABS: 75.0000 mg | ORAL_TABLET | Freq: Every day | ORAL | 11 refills | Status: DC

## 2017-10-26 NOTE — Telephone Encounter (Signed)
The order for the potassium is under the lab tab; Mrs Hensch will let lab at Dr Cruzita Lederer office know that pt needs to have K lab drawn also on 10/30/17 . Mrs Wisehart will call [facility] for results if does not receive call in 1 -2 days after lab is drawn. Nothing further needed. FYI to [facility] CMA.

## 2017-10-26 NOTE — Telephone Encounter (Signed)
Please call him She was concerned due to an elevated potassium ----which is new (and can occasionally be a lab issue).  Have him come in next week for a repeat potassium. If it is still elevated, we can change to a different BP med (but hopefully it will just be back to normal)

## 2017-10-26 NOTE — Telephone Encounter (Signed)
Copied from Opp [id number]. Topic: General - Other >> Oct 26, 2017 10:16 AM Yvette Rack wrote: Reason for CRM: Pt wife Peter Congo states pt medication is being changed by another provider so they would need to speak with Dr. Silvio Pate or his nurse. Cb# [phone]

## 2017-10-26 NOTE — Telephone Encounter (Signed)
Pt's wife, Peter Congo called, and was given information per  Dr Silvio Pate to schedule non-fasting lab for next week; she verbalizes that the pt is having labs drawn per Dr Cruzita Lederer next week so could all the labs be drawn at that time; they can be contacted at [phone]; will route to office for provider review.

## 2017-10-26 NOTE — Telephone Encounter (Signed)
Left message to call office.  PEC, if she calls back, please advise her of the potassium note and schedule a non-fasting lab for next week to repeat his potassium.

## 2017-10-26 NOTE — Telephone Encounter (Signed)
Spoke to pt's wife. He went to Dr Cruzita Lederer. Suggested he should go off Lisinopril. Wanted to see what Dr Silvio Pate thought. Should it be substituted with something else?  Should he be on plavix and aspirin or just plavix.

## 2017-10-30 ENCOUNTER — Other Ambulatory Visit (INDEPENDENT_AMBULATORY_CARE_PROVIDER_SITE_OTHER): Payer: Medicare HMO

## 2017-10-30 ENCOUNTER — Encounter: Payer: Self-pay | Admitting: Internal Medicine

## 2017-10-30 DIAGNOSIS — E875 Hyperkalemia: Secondary | ICD-10-CM

## 2017-10-30 LAB — POTASSIUM: Potassium: 5 mEq/L (ref 3.5–5.1)

## 2017-11-01 DIAGNOSIS — E213 Hyperparathyroidism, unspecified: Secondary | ICD-10-CM | POA: Diagnosis not present

## 2017-11-01 NOTE — Addendum Note (Signed)
Addended by: Kaylyn Lim I on: 11/01/2017 09:13 AM   Modules accepted: Orders

## 2017-11-02 LAB — CALCIUM, URINE, 24 HOUR: Calcium, 24H Urine: 56 mg/24 h

## 2017-11-02 LAB — CREATININE, URINE, 24 HOUR: Creatinine, 24H Ur: 1.11 g/(24.h) (ref 0.50–2.15)

## 2017-11-02 NOTE — Addendum Note (Signed)
Addended by: Philemon Kingdom on: 11/02/2017 04:26 PM   Modules accepted: Orders

## 2017-11-07 ENCOUNTER — Ambulatory Visit: Payer: Medicare HMO | Admitting: Internal Medicine

## 2017-11-07 DIAGNOSIS — R69 Illness, unspecified: Secondary | ICD-10-CM | POA: Diagnosis not present

## 2017-11-09 ENCOUNTER — Ambulatory Visit: Payer: Medicare HMO | Admitting: Internal Medicine

## 2017-12-11 ENCOUNTER — Ambulatory Visit (HOSPITAL_COMMUNITY)
Admission: RE | Admit: 2017-12-11 | Discharge: 2017-12-11 | Disposition: A | Discharge status: Home / Self Care | Payer: Medicare HMO | Source: Ambulatory Visit | Attending: Internal Medicine | Admitting: Internal Medicine

## 2017-12-11 ENCOUNTER — Encounter (HOSPITAL_COMMUNITY)
Admission: RE | Admit: 2017-12-11 | Discharge: 2017-12-11 | Disposition: A | Discharge status: Home / Self Care | Payer: Medicare HMO | Source: Ambulatory Visit | Attending: Internal Medicine | Admitting: Internal Medicine

## 2017-12-11 DIAGNOSIS — E213 Hyperparathyroidism, unspecified: Secondary | ICD-10-CM | POA: Insufficient documentation

## 2017-12-11 MED ORDER — TECHNETIUM TC 99M SESTAMIBI - CARDIOLITE: 25.0000 | Freq: Once | INTRAVENOUS | Status: DC | PRN

## 2017-12-12 NOTE — Addendum Note (Signed)
Addended by: Philemon Kingdom on: 12/12/2017 01:14 PM   Modules accepted: Orders

## 2018-01-02 ENCOUNTER — Ambulatory Visit: Payer: Self-pay | Admitting: Surgery

## 2018-01-02 DIAGNOSIS — E210 Primary hyperparathyroidism: Secondary | ICD-10-CM | POA: Diagnosis not present

## 2018-01-02 DIAGNOSIS — R972 Elevated prostate specific antigen [PSA]: Secondary | ICD-10-CM | POA: Diagnosis not present

## 2018-01-03 ENCOUNTER — Other Ambulatory Visit: Payer: Self-pay | Admitting: Surgery

## 2018-01-03 DIAGNOSIS — E210 Primary hyperparathyroidism: Secondary | ICD-10-CM

## 2018-01-07 ENCOUNTER — Ambulatory Visit
Admission: RE | Admit: 2018-01-07 | Discharge: 2018-01-07 | Disposition: A | Discharge status: Home / Self Care | Payer: Medicare HMO | Source: Ambulatory Visit | Attending: Surgery | Admitting: Surgery

## 2018-01-07 DIAGNOSIS — E041 Nontoxic single thyroid nodule: Secondary | ICD-10-CM | POA: Diagnosis not present

## 2018-01-07 DIAGNOSIS — E210 Primary hyperparathyroidism: Secondary | ICD-10-CM

## 2018-01-09 DIAGNOSIS — N401 Enlarged prostate with lower urinary tract symptoms: Secondary | ICD-10-CM | POA: Diagnosis not present

## 2018-01-09 DIAGNOSIS — R972 Elevated prostate specific antigen [PSA]: Secondary | ICD-10-CM | POA: Diagnosis not present

## 2018-01-09 DIAGNOSIS — R350 Frequency of micturition: Secondary | ICD-10-CM | POA: Diagnosis not present

## 2018-01-10 ENCOUNTER — Telehealth: Payer: Self-pay

## 2018-01-10 NOTE — Telephone Encounter (Signed)
RN call Abigail Butts and stated clearance letter was done and fax. Abigail Butts verbalized understanding. Abigail Butts stated they call the patient about the directions on when to stop the medication.

## 2018-01-10 NOTE — Telephone Encounter (Signed)
Rn spoke with Abigail Butts RN about pt needing clearance. RN stated pt had stroke in 08/2017. RN typically pts wait 6 months for surgery after having a stroke. Rn stated its always a risk factor when pt is off aspirin/plavix for procedures or surgeries. Rn stated if its urgent surgery or procedure pt can be cleared but its always a risk. Abigail Butts stated Dr. Harlow Asa schedule is getting full so It may be schedule in January 2020.

## 2018-01-10 NOTE — Telephone Encounter (Signed)
Dr.Sethi reviewed pts chart. Clearance letter written and fax to [phone]. Form fax twice and confirmed.

## 2018-01-25 ENCOUNTER — Telehealth: Payer: Self-pay

## 2018-01-25 NOTE — Telephone Encounter (Signed)
During the power outrage Rn was given a message from [facility]. Dr. Leonie Man showed me his phone that had a message of pt needing clearance for dental extraction asap. Rn call Samantha Crimes at [phone]. The number was not a dental office it was a company. Unable to reach office.

## 2018-01-29 ENCOUNTER — Other Ambulatory Visit: Payer: Self-pay | Admitting: Internal Medicine

## 2018-01-29 NOTE — Telephone Encounter (Signed)
Rn receive call from Dr. Randol Kern office dental MD.MElissa wanted to know about pt being on plavix as a new medication. Rn stated pt has been on medication for over 6 months for a stroke. Rn stated pt has another surgery in January 2020 that he has to be off plavix for 5 days. Melissa stated do we check the INR for plavix. Rn stated we dont do lab work for pts when they take plavix. Melissa stated pt does have to be off plavix for tooth extraction they just wanted to confirmed about lab work. Rn stated if pt has to be off medication for plavix this month it puts him at risk for a stroke being that he is having surgery in January 2020. Melissa stated pt will not need to be off plavix. They can treat his extraction while being on plavix. COntact number for office is [phone].

## 2018-01-30 ENCOUNTER — Other Ambulatory Visit: Payer: Self-pay | Admitting: Internal Medicine

## 2018-02-05 DIAGNOSIS — R69 Illness, unspecified: Secondary | ICD-10-CM | POA: Diagnosis not present

## 2018-02-18 ENCOUNTER — Ambulatory Visit (INDEPENDENT_AMBULATORY_CARE_PROVIDER_SITE_OTHER): Payer: Medicare HMO | Admitting: Internal Medicine

## 2018-02-18 ENCOUNTER — Encounter: Payer: Self-pay | Admitting: Internal Medicine

## 2018-02-18 VITALS — BP 124/84 | HR 76 | Temp 98.2°F | Ht 68.0 in | Wt 166.0 lb

## 2018-02-18 DIAGNOSIS — Z01818 Encounter for other preprocedural examination: Secondary | ICD-10-CM | POA: Diagnosis not present

## 2018-02-18 DIAGNOSIS — R69 Illness, unspecified: Secondary | ICD-10-CM | POA: Diagnosis not present

## 2018-02-18 DIAGNOSIS — E213 Hyperparathyroidism, unspecified: Secondary | ICD-10-CM | POA: Diagnosis not present

## 2018-02-18 MED ORDER — GLUCOSE BLOOD VI STRP: ORAL_STRIP | 3 refills | Status: DC

## 2018-02-18 NOTE — Assessment & Plan Note (Signed)
They have reasonable questions about whether this needs to be done I am not convinced that he is symptomatic from this, but I explained that at his (reasonably young) age, the chances of trouble with this were high enough to justify going ahead with the surgery

## 2018-02-18 NOTE — Progress Notes (Signed)
Subjective:    Patient ID: Tony Jackson., male    DOB: [birth year], 77 y.o.   MRN: [id number]  HPI Here with wife for preoperative evaluation Has hyperparathyroidism and confirmed adenoma  Apparently neurology already gave consent to stop the plavix before the procedure Has had hypotensive reactions to prostate biopsy in past--not clear if this was pain related Did have colonoscopy in May--no problems with that Cataract surgery also done--no complications  Vision issues have resolved---very brief from the stroke  Diabetes control has been good Last A1c 7.2% Dr Cruzita Lederer noted the hypercalcemia and sent him for further evaluation  No chest pain No palpitations No SOB ?slight dizziness--when walking. No syncope No edema  Current Outpatient Medications on File Prior to Visit  Medication Sig Dispense Refill  . amLODipine (NORVASC) 10 MG tablet TAKE 1 TABLET BY MOUTH EVERY DAY (Patient taking differently: Take 10 mg by mouth daily. ) 90 tablet 3  . aspirin EC 81 MG tablet Take 81 mg by mouth daily.    Marland Kitchen atorvastatin (LIPITOR) 80 MG tablet TAKE 1 TABLET BY MOUTH EVERY EVENING AT 6 PM (Patient taking differently: Take 80 mg by mouth daily at 6 PM. ) 90 tablet 3  . Cholecalciferol (VITAMIN D3) 50 MCG (2000 UT) TABS Take 4,000 Units by mouth daily.    . clopidogrel (PLAVIX) 75 MG tablet Take 1 tablet (75 mg total) by mouth daily. 30 tablet 11  . glipiZIDE (GLUCOTROL XL) 5 MG 24 hr tablet TAKE 1 TABLET BY MOUTH EVERY DAY WITH BREAKFAST (Patient taking differently: Take 5 mg by mouth daily with breakfast. ) 90 tablet 3  . ketoconazole (NIZORAL) 2 % shampoo APPLY 1 APPLICATION TOPICALLY 2 (TWO) TIMES A WEEK. (Patient taking differently: Apply 1 application topically 2 (two) times a week. ) 120 mL 2  . lisinopril (PRINIVIL,ZESTRIL) 40 MG tablet TAKE 1 TABLET BY MOUTH EVERY DAY (Patient taking differently: Take 40 mg by mouth daily. ) 90 tablet 3  . metFORMIN (GLUCOPHAGE) 1000 MG tablet TAKE  1 TABLET BY MOUTH TWICE A DAY WITH A MEAL (Patient taking differently: Take 1,000 mg by mouth 2 (two) times daily with a meal. ) 180 tablet 3  . mometasone (ELOCON) 0.1 % cream APPLY 1 APPLICATION TOPICALLY 2 (TWO) TIMES DAILY AS NEEDED (SKIN). 45 g 1  . ONETOUCH DELICA LANCETS FINE MISC Use 1 daily to obtain blood sample Dx Code E11.21 100 each 3  . triamcinolone cream (KENALOG) 0.1 % Apply 1 application topically 2 (two) times daily as needed (itching). 30 g 1   No current facility-administered medications on file prior to visit.     No Known Allergies  Past Medical History:  Diagnosis Date  . Cataract    in both eyes    had cataracts surgically removed  . CVA (cerebral infarction)   . Diabetes mellitus   . ED (erectile dysfunction)   . Elevated PSA   . Frequency of urination   . GERD (gastroesophageal reflux disease)   . History of nephrolithiasis   . Hyperlipidemia   . Hypertension   . Lung nodule   . Psoriasis   . PSVT (paroxysmal supraventricular tachycardia) ([place])   . Stroke [facility])    2015    Past Surgical History:  Procedure Laterality Date  . CATARACT EXTRACTION W/ INTRAOCULAR LENS IMPLANT Right 03/30/12   Dr Kathrin Penner  . COLONOSCOPY     most recent 2019  . 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 NM MYOVIEW LTD     normal EF 56% 03/08  . POLYPECTOMY    . PROSTATE BIOPSY  01/05/2012   Procedure: BIOPSY TRANSRECTAL ULTRASONIC PROSTATE (TUBP);  Surgeon: [name], MD;  Location: [facility];  Service: Urology;  Laterality: N/A;  . TEE WITHOUT CARDIOVERSION N/A 05/21/2014   Procedure: TRANSESOPHAGEAL ECHOCARDIOGRAM (TEE);  Surgeon: Jerline Pain, MD;  Location: [facility];  Service: Cardiovascular;  Laterality: N/A;  . TONSILLECTOMY  1960    Family History  Problem Relation Age of Onset  . Cancer Mother        breast cancer  . Diabetes Mother   . Colon polyps Brother   . Heart disease Neg Hx   . Colon cancer Neg  Hx   . Esophageal cancer Neg Hx   . Rectal cancer Neg Hx   . Stomach cancer Neg Hx     Social History   Socioeconomic History  . Marital status: Married    Spouse name: Peter Congo  . Number of children: 2  . Years of education: HS  . Highest education level: Not on file  Occupational History  . 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 Financial resource strain: Not on file  . Food insecurity:    Worry: Not on file    Inability: Not on file  . Transportation needs:    Medical: Not on file    Non-medical: Not on file  Tobacco Use  . Smoking status: Former Smoker    Packs/day: 1.00    Years: 5.00    Pack years: 5.00    Types: Cigarettes  . Smokeless tobacco: Current User    Types: Chew  . Tobacco comment: QUIT SMOKING CIGARETTES 50 YRS AGO--  CHEWED TOBACCO FOR 40 YRS  Substance and Sexual Activity  . Alcohol use: No    Alcohol/week: 0.0 standard drinks  . Drug use: No  . Sexual activity: Not Currently  Lifestyle  . Physical activity:    Days per week: Not on file    Minutes per session: Not on file  . Stress: Not on file  Relationships  . Social connections:    Talks on phone: Not on file    Gets together: Not on file    Attends religious service: Not on file    Active member of club or organization: Not on file    Attends meetings of clubs or organizations: Not on file    Relationship status: Not on file  . Intimate partner violence:    Fear of current or ex partner: Not on file    Emotionally abused: Not on file    Physically abused: Not on file    Forced sexual activity: Not on file  Other Topics Concern  . Not on file  Social History Narrative   Has living will   Requests wife as health care POA.   Would accept resuscitation attempts   Not sure about tube feeds      Lives at home with his wife   Right handed    Caffeine: occasional    Review of Systems  Appetite is good Sleeping okay Recent URI but is over that now. No  residual cough     Objective:   Physical Exam  Constitutional: He appears well-developed. No distress.  Neck: No thyromegaly present.  Cardiovascular: Normal rate, regular rhythm and normal heart sounds. Exam reveals no gallop.  No murmur heard. Respiratory: Effort  normal and breath sounds normal. No respiratory distress. He has no wheezes. He has no rales.  GI: Soft. There is no abdominal tenderness.  Musculoskeletal:        General: No tenderness or edema.  Lymphadenopathy:    He has no cervical adenopathy.  Skin: No rash noted. No erythema.  Psychiatric: He has a normal mood and affect. His behavior is normal.           Assessment & Plan:

## 2018-02-18 NOTE — Assessment & Plan Note (Signed)
No cardiorespiratory contraindications for this low risk procedure. Hold plavix/ASA per neurology if they approve No reason to recheck EKG for now

## 2018-02-21 NOTE — Patient Instructions (Addendum)
Tony Jackson.  02/21/2018   Your procedure is scheduled on: 02-28-18    Report to [facility] Main  Entrance    Report to admitting at 5:30AM    Call this number if you have problems the morning of surgery [phone]   NO TOBACCO PRODUCTS AT LEAST 24 HOURS PRIOR TO SURGERY !     Remember: Do not eat food or drink liquids :After Midnight. BRUSH YOUR TEETH MORNING OF SURGERY AND RINSE YOUR MOUTH OUT, NO CHEWING GUM CANDY OR MINTS.     Take these medicines the morning of surgery with A SIP OF WATER:  Amlodipine   DO NOT TAKE ANY DIABETIC MEDICATIONS DAY OF YOUR SURGERY PLEASE CHECK YOUR BLOOD SUGAR THE MORNING OF SURGERY                                You may not have any metal on your body including hair pins and              piercings  Do not wear jewelry, make-up, lotions, powders or perfumes, deodorant             Do not wear nail polish.  Do not shave  48 hours prior to surgery.              Men may shave face and neck.   Do not bring valuables to the hospital. [place].  Contacts, dentures or bridgework may not be worn into surgery.  Leave suitcase in the car. After surgery it may be brought to your room.     Patients discharged the day of surgery will not be allowed to drive home. IF YOU ARE HAVING SURGERY AND GOING HOME THE SAME DAY, YOU MUST HAVE AN ADULT TO DRIVE YOU HOME AND BE WITH YOU FOR 24 HOURS. YOU MAY GO HOME BY TAXI OR UBER OR ORTHERWISE, BUT AN ADULT MUST ACCOMPANY YOU HOME AND STAY WITH YOU FOR 24 HOURS.  Name and phone number of your driver:  Special Instructions: N/A              Please read over the following fact sheets you were given: _____________________________________________________________________             [facility] - Preparing for Surgery Before surgery, you can play an important role.  Because skin is not sterile, your skin needs to be as free of germs as  possible.  You can reduce the number of germs on your skin by washing with CHG (chlorahexidine gluconate) soap before surgery.  CHG is an antiseptic cleaner which kills germs and bonds with the skin to continue killing germs even after washing. Please DO NOT use if you have an allergy to CHG or antibacterial soaps.  If your skin becomes reddened/irritated stop using the CHG and inform your nurse when you arrive at Short Stay. Do not shave (including legs and underarms) for at least 48 hours prior to the first CHG shower.  You may shave your face/neck. Please follow these instructions carefully:  1.  Shower with CHG Soap the night before surgery and the  morning of Surgery.  2.  If you choose to wash your hair, wash your hair first as  usual with your  normal  shampoo.  3.  After you shampoo, rinse your hair and body thoroughly to remove the  shampoo.                           4.  Use CHG as you would any other liquid soap.  You can apply chg directly  to the skin and wash                       Gently with a scrungie or clean washcloth.  5.  Apply the CHG Soap to your body ONLY FROM THE NECK DOWN.   Do not use on face/ open                           Wound or open sores. Avoid contact with eyes, ears mouth and genitals (private parts).                       Wash face,  Genitals (private parts) with your normal soap.             6.  Wash thoroughly, paying special attention to the area where your surgery  will be performed.  7.  Thoroughly rinse your body with warm water from the neck down.  8.  DO NOT shower/wash with your normal soap after using and rinsing off  the CHG Soap.                9.  Pat yourself dry with a clean towel.            10.  Wear clean pajamas.            11.  Place clean sheets on your bed the night of your first shower and do not  sleep with pets. Day of Surgery : Do not apply any lotions/deodorants the morning of surgery.  Please wear clean clothes to the hospital/surgery  center.  FAILURE TO FOLLOW THESE INSTRUCTIONS MAY RESULT IN THE CANCELLATION OF YOUR SURGERY PATIENT SIGNATURE_________________________________  NURSE SIGNATURE__________________________________  ________________________________________________________________________

## 2018-02-21 NOTE — Progress Notes (Signed)
Echo 09-07-17 epic   ekg 09-06-17 epic   Medical clearance 02-18-18 epic

## 2018-02-24 ENCOUNTER — Encounter: Payer: Self-pay | Admitting: Gastroenterology

## 2018-02-25 ENCOUNTER — Other Ambulatory Visit: Payer: Self-pay

## 2018-02-25 ENCOUNTER — Encounter (HOSPITAL_COMMUNITY): Payer: Self-pay

## 2018-02-25 ENCOUNTER — Encounter (HOSPITAL_COMMUNITY)
Admission: RE | Admit: 2018-02-25 | Discharge: 2018-02-25 | Disposition: A | Discharge status: Home / Self Care | Payer: Medicare HMO | Source: Ambulatory Visit | Attending: Surgery | Admitting: Surgery

## 2018-02-25 DIAGNOSIS — Z79899 Other long term (current) drug therapy: Secondary | ICD-10-CM | POA: Diagnosis not present

## 2018-02-25 DIAGNOSIS — Z833 Family history of diabetes mellitus: Secondary | ICD-10-CM | POA: Diagnosis not present

## 2018-02-25 DIAGNOSIS — I2510 Atherosclerotic heart disease of native coronary artery without angina pectoris: Secondary | ICD-10-CM | POA: Diagnosis not present

## 2018-02-25 DIAGNOSIS — Z7984 Long term (current) use of oral hypoglycemic drugs: Secondary | ICD-10-CM | POA: Diagnosis not present

## 2018-02-25 DIAGNOSIS — Z87442 Personal history of urinary calculi: Secondary | ICD-10-CM | POA: Diagnosis not present

## 2018-02-25 DIAGNOSIS — I10 Essential (primary) hypertension: Secondary | ICD-10-CM | POA: Diagnosis not present

## 2018-02-25 DIAGNOSIS — E7800 Pure hypercholesterolemia, unspecified: Secondary | ICD-10-CM | POA: Diagnosis not present

## 2018-02-25 DIAGNOSIS — Z7902 Long term (current) use of antithrombotics/antiplatelets: Secondary | ICD-10-CM | POA: Diagnosis not present

## 2018-02-25 DIAGNOSIS — E210 Primary hyperparathyroidism: Secondary | ICD-10-CM | POA: Insufficient documentation

## 2018-02-25 DIAGNOSIS — Z8673 Personal history of transient ischemic attack (TIA), and cerebral infarction without residual deficits: Secondary | ICD-10-CM | POA: Diagnosis not present

## 2018-02-25 DIAGNOSIS — Z87891 Personal history of nicotine dependence: Secondary | ICD-10-CM | POA: Diagnosis not present

## 2018-02-25 DIAGNOSIS — Z01812 Encounter for preprocedural laboratory examination: Secondary | ICD-10-CM

## 2018-02-25 DIAGNOSIS — D351 Benign neoplasm of parathyroid gland: Secondary | ICD-10-CM | POA: Diagnosis not present

## 2018-02-25 DIAGNOSIS — E119 Type 2 diabetes mellitus without complications: Secondary | ICD-10-CM | POA: Diagnosis not present

## 2018-02-25 DIAGNOSIS — Z8249 Family history of ischemic heart disease and other diseases of the circulatory system: Secondary | ICD-10-CM | POA: Diagnosis not present

## 2018-02-25 HISTORY — DX: Other complications of anesthesia, initial encounter: T88.59XA

## 2018-02-25 HISTORY — DX: Adverse effect of unspecified anesthetic, initial encounter: T41.45XA

## 2018-02-25 LAB — CBC
HCT: 40.2 % (ref 39.0–52.0)
Hemoglobin: 13.2 g/dL (ref 13.0–17.0)
MCH: 30.8 pg (ref 26.0–34.0)
MCHC: 32.8 g/dL (ref 30.0–36.0)
MCV: 93.7 fL (ref 80.0–100.0)
Platelets: 232 10*3/uL (ref 150–400)
RBC: 4.29 MIL/uL (ref 4.22–5.81)
RDW: 12.9 % (ref 11.5–15.5)
WBC: 5.8 10*3/uL (ref 4.0–10.5)
nRBC: 0 % (ref 0.0–0.2)

## 2018-02-25 LAB — BASIC METABOLIC PANEL
Anion gap: 7 (ref 5–15)
BUN: 29 mg/dL — ABNORMAL HIGH (ref 8–23)
CO2: 24 mmol/L (ref 22–32)
Calcium: 9.7 mg/dL (ref 8.9–10.3)
Chloride: 112 mmol/L — ABNORMAL HIGH (ref 98–111)
Creatinine, Ser: 1.41 mg/dL — ABNORMAL HIGH (ref 0.61–1.24)
GFR calc Af Amer: 55 mL/min — ABNORMAL LOW (ref >60)
GFR calc non Af Amer: 48 mL/min — ABNORMAL LOW (ref >60)
Glucose, Bld: 161 mg/dL — ABNORMAL HIGH (ref 70–99)
Potassium: 5.1 mmol/L (ref 3.5–5.1)
Sodium: 143 mmol/L (ref 135–145)

## 2018-02-25 LAB — HEMOGLOBIN A1C
Hgb A1c MFr Bld: 5.8 % — ABNORMAL HIGH (ref 4.8–5.6)
Mean Plasma Glucose: 119.76 mg/dL

## 2018-02-25 LAB — GLUCOSE, CAPILLARY: Glucose-Capillary: 157 mg/dL — ABNORMAL HIGH (ref 70–99)

## 2018-02-25 NOTE — Progress Notes (Signed)
Neuro clearance on chart 01-10-18 Dr Leonie Man

## 2018-02-27 ENCOUNTER — Encounter (HOSPITAL_COMMUNITY): Payer: Self-pay | Admitting: Surgery

## 2018-02-27 NOTE — H&P (Signed)
General Surgery [facility]  Tony Jackson DOB: 12-03-1940 Married / Language: English / Race: White Male   History of Present Illness  The patient is a 78 year old male who presents with primary hyperparathyroidism.  CHIEF COMPLAINT: primary hyperparathyroidism  Patient is referred by Dr. Philemon Kingdom for surgical evaluation and management of primary hyperparathyroidism. Patient had been noted on routine laboratory studies to have elevated serum calcium levels ranging as high as 11.1. Intact PTH levels ranged from upper normal to as high as 103. Patient has a history of nephrolithiasis in 2014. He does complain of fatigue and memory problems. He complains of frequent urination. He has not had a bone density scan. Patient underwent nuclear medicine parathyroid scan on December 11, 2017. This localized a left inferior parathyroid adenoma. Patient has not had further imaging studies. Patient has had no prior head or neck surgery. He does have a history of multiple strokes and is followed by neurology. He is currently taking Plavix and baby aspirin. Patient also has diabetes. There is no family history of parathyroid disease. There is no history of other endocrine neoplasms. Patient is accompanied today by his wife.   Past Surgical History Colon Polyp Removal - Colonoscopy   Allergies  No Known Drug Allergies [01/02/2018]: Allergies Reconciled   Medication History  Clopidogrel Bisulfate (75MG  Tablet, Oral) Active. Atorvastatin Calcium (80MG  Tablet, Oral) Active. Mometasone Furoate (0.1% Cream, External) Active. metFORMIN HCl (1000MG  Tablet, Oral) Active. glipiZIDE ER (5MG  Tablet ER 24HR, Oral) Active. amLODIPine Besylate (10MG  Tablet, Oral) Active. Lisinopril (40MG  Tablet, Oral) Active. Ketoconazole (2% Shampoo, External) Active. Medications Reconciled  Social History  Caffeine use  Coffee. Tobacco use  Former  smoker.  Family History Breast Cancer  Mother. Diabetes Mellitus  Mother. Heart Disease  Brother. Respiratory Condition  Mother.  Other Problems Cerebrovascular Accident  Diabetes Mellitus  Enlarged Prostate  High blood pressure  Hypercholesterolemia  Kidney Stone   Review of Systems General Not Present- Appetite Loss, Chills, Fatigue, Fever, Night Sweats, Weight Gain and Weight Loss. Skin Not Present- Change in Wart/Mole, Dryness, Hives, Jaundice, New Lesions, Non-Healing Wounds, Rash and Ulcer. Respiratory Not Present- Bloody sputum, Chronic Cough, Difficulty Breathing, Snoring and Wheezing. Breast Not Present- Breast Mass, Breast Pain, Nipple Discharge and Skin Changes. Cardiovascular Not Present- Chest Pain, Difficulty Breathing Lying Down, Leg Cramps, Palpitations, Rapid Heart Rate, Shortness of Breath and Swelling of Extremities. Gastrointestinal Not Present- Abdominal Pain, Bloating, Bloody Stool, Change in Bowel Habits, Chronic diarrhea, Constipation, Difficulty Swallowing, Excessive gas, Gets full quickly at meals, Hemorrhoids, Indigestion, Nausea, Rectal Pain and Vomiting. Male Genitourinary Present- Frequency, Impotence and Urgency. Not Present- Blood in Urine, Change in Urinary Stream, Nocturia, Painful Urination and Urine Leakage. Musculoskeletal Not Present- Back Pain, Joint Pain, Joint Stiffness, Muscle Pain, Muscle Weakness and Swelling of Extremities. Neurological Present- Decreased Memory. Not Present- Fainting, Headaches, Numbness, Seizures, Tingling, Tremor, Trouble walking and Weakness. Psychiatric Not Present- Anxiety, Bipolar, Change in Sleep Pattern, Depression, Fearful and Frequent crying. Endocrine Not Present- Cold Intolerance, Excessive Hunger, Hair Changes, Heat Intolerance, Hot flashes and New Diabetes.  Vitals  Weight: 168.25 lb Height: 69in Body Surface Area: 1.92 m Body Mass Index: 24.85 kg/m  Temp.: 98.10F  Pulse: 78 (Regular)   BP: 140/80 (Sitting, Left Arm, Standard)   Physical Exam  See vital signs recorded above  GENERAL APPEARANCE Development: normal Nutritional status: normal Gross deformities: none  SKIN Rash, lesions, ulcers: none Induration, erythema: none Nodules: none palpable  EYES Conjunctiva  and lids: normal Pupils: equal and reactive Iris: normal bilaterally  EARS, NOSE, MOUTH, THROAT External ears: no lesion or deformity External nose: no lesion or deformity Hearing: grossly normal Lips: no lesion or deformity Dentition: normal for age Oral mucosa: moist  NECK Symmetric: yes Trachea: midline Thyroid: no palpable nodules in the thyroid bed  CHEST Respiratory effort: normal Retraction or accessory muscle use: no Breath sounds: normal bilaterally Rales, rhonchi, wheeze: none  CARDIOVASCULAR Auscultation: regular rhythm, normal rate Murmurs: none Pulses: carotid and radial pulse 2+ palpable Lower extremity edema: none Lower extremity varicosities: none  MUSCULOSKELETAL Station and gait: normal Digits and nails: no clubbing or cyanosis Muscle strength: grossly normal all extremities Range of motion: grossly normal all extremities Deformity: none  LYMPHATIC Cervical: none palpable Supraclavicular: none palpable  PSYCHIATRIC Oriented to person, place, and time: yes Mood and affect: normal for situation Judgment and insight: appropriate for situation    Assessment & Plan  PRIMARY HYPERPARATHYROIDISM (E21.0)  Pt Education - Pamphlet Given - The Parathyroid Surgery Book: discussed with patient and provided information. Patient is referred by his endocrinologist for consideration for surgery for treatment of primary hyperparathyroidism. Patient is accompanied by his wife. They are provided with written literature on parathyroid surgery to review at home.  Patient has biochemical evidence of primary hyperparathyroidism. Nuclear medicine parathyroid scan has  localized a left inferior parathyroid adenoma. Patient would be a good candidate for minimally invasive surgery.  Patient will require a ultrasound examination of the neck to evaluate the thyroid gland and to possibly better define the left inferior parathyroid adenoma. We will obtain this study in the immediate future.  Patient will require clearance from neurology regarding management of his Plavix and baby aspirin. We will send a request today. I would like to be able to stop these agents 5 days prior to surgery.  We discussed minimally invasive parathyroidectomy. We discussed the fact that there is approximately a 95% chance of single gland disease. We discussed a full neck exploration as has been done for multi-gland disease or when localization studies are not successful. We discussed risk and benefits of surgery including the potential for recurrent laryngeal nerve injury. We have discussed doing this as an outpatient surgical procedure. We discussed the postoperative recovery and return to activities. Patient and his wife understand and agree to proceed in the near future.  The risks and benefits of the procedure have been discussed at length with the patient. The patient understands the proposed procedure, potential alternative treatments, and the course of recovery to be expected. All of the patient's questions have been answered at this time. The patient wishes to proceed with surgery.  Armandina Gemma, [place] [facility] Office: [phone]

## 2018-02-28 ENCOUNTER — Ambulatory Visit (HOSPITAL_COMMUNITY): Payer: Medicare HMO | Admitting: Physician Assistant

## 2018-02-28 ENCOUNTER — Ambulatory Visit (HOSPITAL_COMMUNITY)
Admission: RE | Admit: 2018-02-28 | Discharge: 2018-02-28 | Disposition: A | Discharge status: Home / Self Care | Payer: Medicare HMO | Attending: Surgery | Admitting: Surgery

## 2018-02-28 ENCOUNTER — Encounter (HOSPITAL_COMMUNITY): Payer: Self-pay | Admitting: Emergency Medicine

## 2018-02-28 ENCOUNTER — Encounter (HOSPITAL_COMMUNITY)
Admission: RE | Disposition: A | Discharge status: Home / Self Care | Payer: Self-pay | Source: Home / Self Care | Attending: Surgery

## 2018-02-28 ENCOUNTER — Ambulatory Visit (HOSPITAL_COMMUNITY): Payer: Medicare HMO | Admitting: Anesthesiology

## 2018-02-28 DIAGNOSIS — Z87442 Personal history of urinary calculi: Secondary | ICD-10-CM | POA: Insufficient documentation

## 2018-02-28 DIAGNOSIS — Z87891 Personal history of nicotine dependence: Secondary | ICD-10-CM | POA: Diagnosis not present

## 2018-02-28 DIAGNOSIS — N183 Chronic kidney disease, stage 3 (moderate): Secondary | ICD-10-CM | POA: Diagnosis not present

## 2018-02-28 DIAGNOSIS — Z7984 Long term (current) use of oral hypoglycemic drugs: Secondary | ICD-10-CM | POA: Diagnosis not present

## 2018-02-28 DIAGNOSIS — I2510 Atherosclerotic heart disease of native coronary artery without angina pectoris: Secondary | ICD-10-CM | POA: Insufficient documentation

## 2018-02-28 DIAGNOSIS — D351 Benign neoplasm of parathyroid gland: Secondary | ICD-10-CM | POA: Insufficient documentation

## 2018-02-28 DIAGNOSIS — E119 Type 2 diabetes mellitus without complications: Secondary | ICD-10-CM | POA: Insufficient documentation

## 2018-02-28 DIAGNOSIS — Z8673 Personal history of transient ischemic attack (TIA), and cerebral infarction without residual deficits: Secondary | ICD-10-CM | POA: Insufficient documentation

## 2018-02-28 DIAGNOSIS — Z7902 Long term (current) use of antithrombotics/antiplatelets: Secondary | ICD-10-CM | POA: Diagnosis not present

## 2018-02-28 DIAGNOSIS — Z833 Family history of diabetes mellitus: Secondary | ICD-10-CM | POA: Insufficient documentation

## 2018-02-28 DIAGNOSIS — E210 Primary hyperparathyroidism: Secondary | ICD-10-CM | POA: Diagnosis not present

## 2018-02-28 DIAGNOSIS — E7800 Pure hypercholesterolemia, unspecified: Secondary | ICD-10-CM | POA: Insufficient documentation

## 2018-02-28 DIAGNOSIS — I10 Essential (primary) hypertension: Secondary | ICD-10-CM | POA: Insufficient documentation

## 2018-02-28 DIAGNOSIS — Z79899 Other long term (current) drug therapy: Secondary | ICD-10-CM | POA: Insufficient documentation

## 2018-02-28 DIAGNOSIS — I129 Hypertensive chronic kidney disease with stage 1 through stage 4 chronic kidney disease, or unspecified chronic kidney disease: Secondary | ICD-10-CM | POA: Diagnosis not present

## 2018-02-28 DIAGNOSIS — D34 Benign neoplasm of thyroid gland: Secondary | ICD-10-CM | POA: Diagnosis not present

## 2018-02-28 DIAGNOSIS — E1122 Type 2 diabetes mellitus with diabetic chronic kidney disease: Secondary | ICD-10-CM | POA: Diagnosis not present

## 2018-02-28 DIAGNOSIS — Z8249 Family history of ischemic heart disease and other diseases of the circulatory system: Secondary | ICD-10-CM | POA: Insufficient documentation

## 2018-02-28 DIAGNOSIS — E213 Hyperparathyroidism, unspecified: Secondary | ICD-10-CM | POA: Diagnosis present

## 2018-02-28 HISTORY — PX: PARATHYROIDECTOMY: SHX19

## 2018-02-28 LAB — GLUCOSE, CAPILLARY
GLUCOSE-CAPILLARY: 136 mg/dL — AB (ref 70–99)
Glucose-Capillary: 125 mg/dL — ABNORMAL HIGH (ref 70–99)

## 2018-02-28 LAB — PROTIME-INR
INR: 0.91
Prothrombin Time: 12.2 seconds (ref 11.4–15.2)

## 2018-02-28 SURGERY — PARATHYROIDECTOMY
Anesthesia: General | Site: Neck | Laterality: Left

## 2018-02-28 MED ORDER — DEXAMETHASONE SODIUM PHOSPHATE 10 MG/ML IJ SOLN
INTRAMUSCULAR | Status: AC
  Filled 2018-02-28: qty 1

## 2018-02-28 MED ORDER — BUPIVACAINE HCL 0.25 % IJ SOLN
INTRAMUSCULAR | Status: DC | PRN
  Administered 2018-02-28: 10 mL

## 2018-02-28 MED ORDER — SUGAMMADEX SODIUM 200 MG/2ML IV SOLN
INTRAVENOUS | Status: AC
  Filled 2018-02-28: qty 2

## 2018-02-28 MED ORDER — CHLORHEXIDINE GLUCONATE CLOTH 2 % EX PADS: 6.0000 | MEDICATED_PAD | Freq: Once | CUTANEOUS | Status: DC

## 2018-02-28 MED ORDER — EPHEDRINE 5 MG/ML INJ
INTRAVENOUS | Status: AC
  Filled 2018-02-28: qty 10

## 2018-02-28 MED ORDER — PROPOFOL 10 MG/ML IV BOLUS
INTRAVENOUS | Status: DC | PRN
  Administered 2018-02-28: 130 mg via INTRAVENOUS

## 2018-02-28 MED ORDER — ONDANSETRON HCL 4 MG/2ML IJ SOLN
INTRAMUSCULAR | Status: DC | PRN
  Administered 2018-02-28: 4 mg via INTRAVENOUS

## 2018-02-28 MED ORDER — BUPIVACAINE HCL (PF) 0.25 % IJ SOLN
INTRAMUSCULAR | Status: AC
  Filled 2018-02-28: qty 30

## 2018-02-28 MED ORDER — CEFAZOLIN SODIUM-DEXTROSE 2-4 GM/100ML-% IV SOLN
2.0000 g | INTRAVENOUS | Status: AC
  Administered 2018-02-28: 2 g via INTRAVENOUS
  Filled 2018-02-28: qty 100

## 2018-02-28 MED ORDER — TRAMADOL HCL 50 MG PO TABS: 50.0000 mg | ORAL_TABLET | Freq: Four times a day (QID) | ORAL | 0 refills | Status: DC | PRN

## 2018-02-28 MED ORDER — LIDOCAINE 2% (20 MG/ML) 5 ML SYRINGE
INTRAMUSCULAR | Status: AC
  Filled 2018-02-28: qty 5

## 2018-02-28 MED ORDER — DEXAMETHASONE SODIUM PHOSPHATE 10 MG/ML IJ SOLN
INTRAMUSCULAR | Status: DC | PRN
  Administered 2018-02-28: 10 mg via INTRAVENOUS

## 2018-02-28 MED ORDER — ROCURONIUM BROMIDE 10 MG/ML (PF) SYRINGE
PREFILLED_SYRINGE | INTRAVENOUS | Status: AC
  Filled 2018-02-28: qty 10

## 2018-02-28 MED ORDER — PROPOFOL 10 MG/ML IV BOLUS
INTRAVENOUS | Status: AC
  Filled 2018-02-28: qty 20

## 2018-02-28 MED ORDER — FENTANYL CITRATE (PF) 250 MCG/5ML IJ SOLN
INTRAMUSCULAR | Status: DC | PRN
  Administered 2018-02-28 (×3): 50 ug via INTRAVENOUS

## 2018-02-28 MED ORDER — ROCURONIUM BROMIDE 10 MG/ML (PF) SYRINGE
PREFILLED_SYRINGE | INTRAVENOUS | Status: DC | PRN
  Administered 2018-02-28: 50 mg via INTRAVENOUS

## 2018-02-28 MED ORDER — LACTATED RINGERS IV SOLN
INTRAVENOUS | Status: DC
  Administered 2018-02-28: 06:00:00 via INTRAVENOUS

## 2018-02-28 MED ORDER — FENTANYL CITRATE (PF) 250 MCG/5ML IJ SOLN
INTRAMUSCULAR | Status: AC
  Filled 2018-02-28: qty 5

## 2018-02-28 MED ORDER — ONDANSETRON HCL 4 MG/2ML IJ SOLN
INTRAMUSCULAR | Status: AC
  Filled 2018-02-28: qty 2

## 2018-02-28 MED ORDER — LIDOCAINE 2% (20 MG/ML) 5 ML SYRINGE
INTRAMUSCULAR | Status: DC | PRN
  Administered 2018-02-28: 60 mg via INTRAVENOUS

## 2018-02-28 MED ORDER — EPHEDRINE SULFATE-NACL 50-0.9 MG/10ML-% IV SOSY
PREFILLED_SYRINGE | INTRAVENOUS | Status: DC | PRN
  Administered 2018-02-28 (×2): 5 mg via INTRAVENOUS

## 2018-02-28 MED ORDER — SUGAMMADEX SODIUM 200 MG/2ML IV SOLN
INTRAVENOUS | Status: DC | PRN
  Administered 2018-02-28: 160 mg via INTRAVENOUS

## 2018-02-28 MED ORDER — 0.9 % SODIUM CHLORIDE (POUR BTL) OPTIME
TOPICAL | Status: DC | PRN
  Administered 2018-02-28: 1000 mL

## 2018-02-28 SURGICAL SUPPLY — 29 items
ADH SKN CLS APL DERMABOND .7 (GAUZE/BANDAGES/DRESSINGS) ×1
ATTRACTOMAT 16X20 MAGNETIC DRP (DRAPES) ×3 IMPLANT
BLADE SURG 15 STRL LF DISP TIS (BLADE) ×1 IMPLANT
BLADE SURG 15 STRL SS (BLADE) ×3
CHLORAPREP W/TINT 26ML (MISCELLANEOUS) ×3 IMPLANT
CLIP VESOCCLUDE MED 6/CT (CLIP) ×6 IMPLANT
CLIP VESOCCLUDE SM WIDE 6/CT (CLIP) ×6 IMPLANT
COVER SURGICAL LIGHT HANDLE (MISCELLANEOUS) ×3 IMPLANT
COVER WAND RF STERILE (DRAPES) ×3 IMPLANT
DERMABOND ADVANCED (GAUZE/BANDAGES/DRESSINGS) ×2
DERMABOND ADVANCED .7 DNX12 (GAUZE/BANDAGES/DRESSINGS) IMPLANT
DRAPE LAPAROTOMY T 98X78 PEDS (DRAPES) ×3 IMPLANT
ELECT PENCIL ROCKER SW 15FT (MISCELLANEOUS) ×3 IMPLANT
ELECT REM PT RETURN 15FT ADLT (MISCELLANEOUS) ×3 IMPLANT
GAUZE 4X4 16PLY RFD (DISPOSABLE) ×3 IMPLANT
GLOVE SURG ORTHO 8.0 STRL STRW (GLOVE) ×3 IMPLANT
GOWN STRL REUS W/TWL XL LVL3 (GOWN DISPOSABLE) ×11 IMPLANT
HEMOSTAT SURGICEL 2X4 FIBR (HEMOSTASIS) ×2 IMPLANT
ILLUMINATOR WAVEGUIDE N/F (MISCELLANEOUS) IMPLANT
KIT BASIN OR (CUSTOM PROCEDURE TRAY) ×3 IMPLANT
NDL HYPO 25X1 1.5 SAFETY (NEEDLE) ×1 IMPLANT
NEEDLE HYPO 25X1 1.5 SAFETY (NEEDLE) ×3 IMPLANT
PACK BASIC VI WITH GOWN DISP (CUSTOM PROCEDURE TRAY) ×3 IMPLANT
SUT MNCRL AB 4-0 PS2 18 (SUTURE) ×3 IMPLANT
SUT VIC AB 3-0 SH 18 (SUTURE) ×3 IMPLANT
SYR BULB IRRIGATION 50ML (SYRINGE) ×3 IMPLANT
SYR CONTROL 10ML LL (SYRINGE) ×3 IMPLANT
TOWEL OR 17X26 10 PK STRL BLUE (TOWEL DISPOSABLE) ×3 IMPLANT
TOWEL OR NON WOVEN STRL DISP B (DISPOSABLE) ×3 IMPLANT

## 2018-02-28 NOTE — Transfer of Care (Signed)
Immediate Anesthesia Transfer of Care Note  Patient: Tony Jackson.  Procedure(s) Performed: LEFT INFERIOR PARATHYROIDECTOMY (Left Neck)  Patient Location: PACU  Anesthesia Type:General  Level of Consciousness: awake  Airway & Oxygen Therapy: Patient Spontanous Breathing and Patient connected to face mask oxygen  Post-op Assessment: Report given to RN and Post -op Vital signs reviewed and stable  Post vital signs: Reviewed and stable  Last Vitals:  Vitals Value Taken Time  BP 152/83 02/28/2018  8:38 AM  Temp    Pulse 87 02/28/2018  8:41 AM  Resp    SpO2 100 % 02/28/2018  8:41 AM  Vitals shown include unvalidated device data.  Last Pain:  Vitals:   02/28/18 0609  TempSrc:   PainSc: 0-No pain      Patients Stated Pain Goal: 4 (98/47/30 8569)  Complications: No apparent anesthesia complications

## 2018-02-28 NOTE — Interval H&P Note (Signed)
History and Physical Interval Note:  02/28/2018 7:16 AM  Tony Jackson.  has presented today for surgery, with the diagnosis of primary hyperparathyroidism  The various methods of treatment have been discussed with the patient and family. After consideration of risks, benefits and other options for treatment, the patient has consented to    Procedure(s): LEFT INFERIOR PARATHYROIDECTOMY (Left) as a surgical intervention .    The patient's history has been reviewed, patient examined, no change in status, stable for surgery.  I have reviewed the patient's chart and labs.  Questions were answered to the patient's satisfaction.    Armandina Gemma, [place] [facility] Office: [place]

## 2018-02-28 NOTE — Anesthesia Procedure Notes (Signed)
Procedure Name: Intubation Date/Time: 02/28/2018 7:41 AM Performed by: Sharlette Dense, CRNA Patient Re-evaluated:Patient Re-evaluated prior to induction Oxygen Delivery Method: Circle system utilized Preoxygenation: Pre-oxygenation with 100% oxygen Induction Type: IV induction Ventilation: Mask ventilation without difficulty and Oral airway inserted - appropriate to patient size Laryngoscope Size: Miller and 3 Grade View: Grade I Tube type: Reinforced Tube size: 7.5 mm Number of attempts: 1 Airway Equipment and Method: Stylet Placement Confirmation: ETT inserted through vocal cords under direct vision,  positive ETCO2 and breath sounds checked- equal and bilateral Secured at: 22 cm Tube secured with: Tape Dental Injury: Teeth and Oropharynx as per pre-operative assessment

## 2018-02-28 NOTE — Op Note (Signed)
OPERATIVE REPORT - PARATHYROIDECTOMY  Preoperative diagnosis: Primary hyperparathyroidism  Postop diagnosis: Same  Procedure: Left inferior minimally invasive parathyroidectomy  Surgeon:  Armandina Gemma, MD  Anesthesia: General endotracheal  Estimated blood loss: Minimal  Preparation: ChloraPrep  Indications: Patient is referred by Dr. Philemon Kingdom for surgical evaluation and management of primary hyperparathyroidism. Patient had been noted on routine laboratory studies to have elevated serum calcium levels ranging as high as 11.1. Intact PTH levels ranged from upper normal to as high as 103. Patient has a history of nephrolithiasis in 2014. He does complain of fatigue and memory problems. He complains of frequent urination. He has not had a bone density scan. Patient underwent nuclear medicine parathyroid scan on December 11, 2017. This localized a left inferior parathyroid adenoma.  Procedure: The patient was prepared in the pre-operative holding area. The patient was brought to the operating room and placed in a supine position on the operating room table. Following administration of general anesthesia, the patient was positioned and then prepped and draped in the usual strict aseptic fashion. After ascertaining that an adequate level of anesthesia been achieved, a neck incision was made with a #15 blade. Dissection was carried through subcutaneous tissues and platysma. Hemostasis was obtained with the electrocautery. Skin flaps were developed circumferentially and a Weitlander retractor was placed for exposure.  Strap muscles were incised in the midline. Strap muscles were reflected lateralley exposing the thyroid lobe. With gentle blunt dissection the thyroid lobe was mobilized.  Dissection was carried through adipose tissue and an enlarged parathyroid gland was identified. It was gently mobilized. Vascular structures were divided between small ligaclips. Care was taken to avoid the  recurrent laryngeal nerve and the esophagus. The parathyroid gland was completely excised. It was submitted to pathology where frozen section confirmed parathyroid tissue consistent with adenoma.  Neck was irrigated with warm saline and good hemostasis was noted. Fibrillar was placed in the operative field. Strap muscles were approximated in the midline with interrupted 3-0 Vicryl sutures. Platysma was closed with interrupted 3-0 Vicryl sutures. Marcaine was infiltrated circumferentially. Skin was closed with a running 4-0 Monocryl subcuticular suture. Wound was washed and dried and Dermabond was applied. Patient was awakened from anesthesia and brought to the recovery room. The patient tolerated the procedure well.   Armandina Gemma, MD [facility] Office: [phone]

## 2018-02-28 NOTE — Anesthesia Postprocedure Evaluation (Signed)
Anesthesia Post Note  Patient: Demarie Hyneman.  Procedure(s) Performed: LEFT INFERIOR PARATHYROIDECTOMY (Left Neck)     Patient location during evaluation: PACU Anesthesia Type: General Level of consciousness: awake and alert Pain management: pain level controlled Vital Signs Assessment: post-procedure vital signs reviewed and stable Respiratory status: spontaneous breathing, nonlabored ventilation, respiratory function stable and patient connected to nasal cannula oxygen Cardiovascular status: blood pressure returned to baseline and stable Postop Assessment: no apparent nausea or vomiting Anesthetic complications: no    Last Vitals:  Vitals:   02/28/18 0926 02/28/18 0935  BP: 137/82 140/83  Pulse: 75 74  Resp: (!) 21 16  Temp: 36.6 C 36.6 C  SpO2: 96% 98%    Last Pain:  Vitals:   02/28/18 1015  TempSrc:   PainSc: 0-No pain                 Catalina Gravel

## 2018-02-28 NOTE — Anesthesia Preprocedure Evaluation (Addendum)
Anesthesia Evaluation  Patient identified by MRN, date of birth, ID band Patient awake    Reviewed: Allergy & Precautions, NPO status , Patient's Chart, lab work & pertinent test results  History of Anesthesia Complications (+) history of anesthetic complications ("low BP during surgery")  Airway Mallampati: II  TM Distance: >3 FB Neck ROM: Full    Dental  (+) Teeth Intact, Dental Advisory Given, Caps, Missing   Pulmonary former smoker,    Pulmonary exam normal breath sounds clear to auscultation       Cardiovascular hypertension, Pt. on medications + CAD  Normal cardiovascular exam+ dysrhythmias Supra Ventricular Tachycardia  Rhythm:Regular Rate:Normal     Neuro/Psych CVA, Residual Symptoms    GI/Hepatic Neg liver ROS, GERD  ,  Endo/Other  diabetes, Type 2, Oral Hypoglycemic Agentsprimary hyperparathyroidism   Renal/GU Renal InsufficiencyRenal disease     Musculoskeletal negative musculoskeletal ROS (+)   Abdominal   Peds  Hematology  (+) Blood dyscrasia (Plavix), ,   Anesthesia Other Findings Day of surgery medications reviewed with the patient.  Reproductive/Obstetrics                            Anesthesia Physical Anesthesia Plan  ASA: III  Anesthesia Plan: General   Post-op Pain Management:    Induction: Intravenous  PONV Risk Score and Plan: 3 and Dexamethasone, Ondansetron and Treatment may vary due to age or medical condition  Airway Management Planned: Oral ETT  Additional Equipment:   Intra-op Plan:   Post-operative Plan: Extubation in OR  Informed Consent: I have reviewed the patients History and Physical, chart, labs and discussed the procedure including the risks, benefits and alternatives for the proposed anesthesia with the patient or authorized representative who has indicated his/her understanding and acceptance.   Dental advisory given  Plan Discussed  with: CRNA  Anesthesia Plan Comments:         Anesthesia Quick Evaluation

## 2018-02-28 NOTE — Discharge Instructions (Signed)
General Anesthesia, Adult, Care After  This sheet gives you information about how to care for yourself after your procedure. Your health care provider may also give you more specific instructions. If you have problems or questions, contact your health care provider. What can I expect after the procedure? After the procedure, the following side effects are common:  Pain or discomfort at the IV site.  Nausea.  Vomiting.  Sore throat.  Trouble concentrating.  Feeling cold or chills.  Weak or tired.  Sleepiness and fatigue.  Soreness and body aches. These side effects can affect parts of the body that were not involved in surgery. Follow these instructions at home:  For at least 24 hours after the procedure:  Have a responsible adult stay with you. It is important to have someone help care for you until you are awake and alert.  Rest as needed.  Do not: ? Participate in activities in which you could fall or become injured. ? Drive. ? Use heavy machinery. ? Drink alcohol. ? Take sleeping pills or medicines that cause drowsiness. ? Make important decisions or sign legal documents. ? Take care of children on your own. Eating and drinking  Follow any instructions from your health care provider about eating or drinking restrictions.  When you feel hungry, start by eating small amounts of foods that are soft and easy to digest (bland), such as toast. Gradually return to your regular diet.  Drink enough fluid to keep your urine pale yellow.  If you vomit, rehydrate by drinking water, juice, or clear broth. General instructions  If you have sleep apnea, surgery and certain medicines can increase your risk for breathing problems. Follow instructions from your health care provider about wearing your sleep device: ? Anytime you are sleeping, including during daytime naps. ? While taking prescription pain medicines, sleeping medicines, or medicines that make you drowsy.  Return  to your normal activities as told by your health care provider. Ask your health care provider what activities are safe for you.  Take over-the-counter and prescription medicines only as told by your health care provider.  If you smoke, do not smoke without supervision.  Keep all follow-up visits as told by your health care provider. This is important. Contact a health care provider if:  You have nausea or vomiting that does not get better with medicine.  You cannot eat or drink without vomiting.  You have pain that does not get better with medicine.  You are unable to pass urine.  You develop a skin rash.  You have a fever.  You have redness around your IV site that gets worse. Get help right away if:  You have difficulty breathing.  You have chest pain.  You have blood in your urine or stool, or you vomit blood. Summary  After the procedure, it is common to have a sore throat or nausea. It is also common to feel tired.  Have a responsible adult stay with you for the first 24 hours after general anesthesia. It is important to have someone help care for you until you are awake and alert.  When you feel hungry, start by eating small amounts of foods that are soft and easy to digest (bland), such as toast. Gradually return to your regular diet.  Drink enough fluid to keep your urine pale yellow.  Return to your normal activities as told by your health care provider. Ask your health care provider what activities are safe for you. This information is  not intended to replace advice given to you by your health care provider. Make sure you discuss any questions you have with your health care provider. Document Released: 05/15/2000 Document Revised: 09/22/2016 Document Reviewed: 09/22/2016 Elsevier Interactive Patient Education  2019 [place].     Parathyroidectomy  A parathyroidectomy is a surgery to remove one or more parathyroid glands. These glands are in the neck.  Each gland is very small, about the size of a pea. Most people have four parathyroid glands. The glands produce parathyroid hormone, which helps to control the level of calcium in the body. You may have a parathyroidectomy if your body produces too much parathyroid hormone (hyperparathyroidism). This usually occurs when one or more of your parathyroid glands becomes enlarged from a type of noncancerous tumor (adenoma). Tell a health care provider about:  Any allergies you have.  All medicines you are taking, including vitamins, herbs, eye drops, creams, and over-the-counter medicines.  Any problems you or family members have had with anesthetic medicines.  Any blood disorders you have.  Any surgeries you have had.  Any medical conditions you have.  Whether you are pregnant or may be pregnant. What are the risks? Generally, this is a safe procedure. However, problems may occur, including:  Bleeding.  Infection.  Allergic reactions to medicines.  Damage to the nerves of your voice box (larynx). This can be temporary or long-term (rare).  Damage to nearby structures and organs, such as the skin (scarring), surrounding blood vessels, and nerves in the neck.  Hoarseness. This usually resolves in 24-48 hours.  A condition in which your body does not make enough parathyroid hormone (hypoparathyroidism). This is rare.  Difficulty breathing. This is rare. What happens before the procedure? Staying hydrated Follow instructions from your health care provider about hydration, which may include:  Up to 2 hours before the procedure - you may continue to drink clear liquids, such as water, clear fruit juice, black coffee, and plain tea. Eating and drinking restrictions Follow instructions from your health care provider about eating and drinking, which may include:  8 hours before the procedure - stop eating heavy meals or foods such as meat, fried foods, or fatty foods.  6 hours before  the procedure - stop eating light meals or foods, such as toast or cereal.  6 hours before the procedure - stop drinking milk or drinks that contain milk.  2 hours before the procedure - stop drinking clear liquids. Medicines Ask your health care provider about:  Changing or stopping your regular medicines. This is especially important if you are taking diabetes medicines or blood thinners.  Taking medicines such as aspirin and ibuprofen. These medicines can thin your blood. Do not take these medicines unless your health care provider tells you to take them.  Taking over-the-counter medicines, vitamins, herbs, and supplements. General instructions  You may be asked to shower with a germ-killing soap.  Plan to have someone take you home from the hospital or clinic.  Plan to have a responsible adult care for you for at least 24 hours after you leave the hospital or clinic. This is important. What happens during the procedure?  To lower your risk of infection: ? Your health care team will wash or sanitize their hands. ? Hair may be removed from the surgical area. ? Your skin will be washed with soap.  An IV will be inserted into one of your veins.  You will be given one or more of the following: ?  A medicine to help you relax (sedative). ? A medicine to make you fall asleep (general anesthetic).  An incision will be made according to the type of parathyroidectomy procedure you are having. There are four methods that may be used: ? Open surgery. A single incision will be made in the center of your neck. The incision will be about 2-4 inches long. ? Minimally invasive surgery. A small incision will be made in the side of your neck. This incision will be about 1-2 inches long. Before the procedure, you might be given an injection of a type of medicine that will help the surgeon to locate the gland. ? Video-assisted surgery. Two small incisions will be made in your neck. One incision is  for the instruments that will be used to remove the gland. The other incision is for a tiny camera that will help the surgeon to see inside your neck. ? Endoscopic surgery. An incision will be made just above your collarbone. A small, flexible tube (endoscope) will be inserted through this incision.  Your health care provider may monitor laryngeal nerve function during the procedure for safety reasons.  The gland or glands that are causing problems will be removed.  The incisions will be closed using stitches (sutures) or other methods. The sutures will often be hidden under the skin. The procedure may vary among health care providers and hospitals. What happens after the procedure?  Your blood pressure, heart rate, breathing rate, and blood oxygen level will be monitored until the medicines you were given have worn off.  You will be given pain medicine as needed.  Your provider will check your ability to talk and swallow after the procedure.  You will gradually start to drink liquids and have soft foods as tolerated.  Your blood will be tested to check the calcium level in your body.  Do not drive for 24 hours if you were given a sedative during your procedure. Summary  The parathyroid glands are located in the neck and produce parathyroid hormone, which helps to control the level of calcium in the body.  A parathyroidectomy is a surgery to remove one or more parathyroid glands.  You may have a parathyroidectomy if your body produces too much parathyroid hormone (hyperparathyroidism).  There are four surgical methods that may be used for a parathyroidectomy: open, minimally invasive, video-assisted, and endoscopic.  Generally, this is a safe procedure. However, problems may occur, including bleeding, infection, and a hoarse or weak voice. This information is not intended to replace advice given to you by your health care provider. Make sure you discuss any questions you have with  your health care provider. Document Released: 05/05/2008 Document Revised: 12/12/2016 Document Reviewed: 12/12/2016 Elsevier Interactive Patient Education  2019 [employer].

## 2018-03-01 ENCOUNTER — Encounter (HOSPITAL_COMMUNITY): Payer: Self-pay | Admitting: Surgery

## 2018-03-14 DIAGNOSIS — E210 Primary hyperparathyroidism: Secondary | ICD-10-CM | POA: Diagnosis not present

## 2018-03-16 ENCOUNTER — Other Ambulatory Visit: Payer: Self-pay | Admitting: Internal Medicine

## 2018-04-22 ENCOUNTER — Other Ambulatory Visit: Payer: Self-pay | Admitting: Internal Medicine

## 2018-04-24 ENCOUNTER — Ambulatory Visit: Payer: Medicare HMO | Admitting: Internal Medicine

## 2018-04-24 ENCOUNTER — Encounter: Payer: Self-pay | Admitting: Internal Medicine

## 2018-04-24 ENCOUNTER — Other Ambulatory Visit: Payer: Self-pay

## 2018-04-24 VITALS — BP 128/70 | HR 70 | Ht 68.0 in | Wt 170.0 lb

## 2018-04-24 DIAGNOSIS — E559 Vitamin D deficiency, unspecified: Secondary | ICD-10-CM | POA: Diagnosis not present

## 2018-04-24 DIAGNOSIS — E875 Hyperkalemia: Secondary | ICD-10-CM | POA: Diagnosis not present

## 2018-04-24 DIAGNOSIS — E213 Hyperparathyroidism, unspecified: Secondary | ICD-10-CM

## 2018-04-24 LAB — VITAMIN D 25 HYDROXY (VIT D DEFICIENCY, FRACTURES): VITD: 58.5 ng/mL (ref 30.00–100.00)

## 2018-04-24 NOTE — Patient Instructions (Signed)
Please stop at the lab.  Please continue vitamin D 4000 units daily.  Please come back for a follow-up appointment in 1 year.

## 2018-04-24 NOTE — Progress Notes (Signed)
Patient ID: Tony Cardiff., male   DOB: 09-08-1940, 78 y.o.   MRN: [id number]    HPI  Tony Toruno. is a 78 y.o.-year-old male, returning for follow-up for primary hyperparathyroidism and vitamin D deficiency.  He is here with his wife offers part of the history especially regarding his symptoms, medications, and most recent medical history.  Last visit 6 months ago.   Pt was dx with hypercalcemia in 2017.  At last visit we diagnosed primary hyperparathyroidism -since then he had parathyroidectomy.  Reviewed history: He was referred to see me in 05/2017.  At that time, his vitamin D was found to be very low and we started him on vitamin D supplementation.  After normalization of his vitamin D, a PTH and a calcium level returned normal.  However, he had one calcium level that was slightly high a month later.  On a subsequent check, this normalized.  Reviewed pertinent labs: Lab Results  Component Value Date   PTH 45 10/23/2017   PTH 61 08/16/2017   PTH 103 (H) 05/09/2017   CALCIUM 9.7 02/25/2018   CALCIUM 11.0 (H) 10/23/2017   CALCIUM 9.5 09/07/2017   CALCIUM 10.5 (H) 09/06/2017   CALCIUM 10.1 08/16/2017   CALCIUM 11.1 (H) 05/09/2017   CALCIUM 10.0 05/17/2016   CALCIUM 10.4 02/18/2016   CALCIUM 10.7 (H) 12/21/2015   CALCIUM 10.5 07/28/2015  08/04/2016: Magnesium 2.3, phosphorus 3.9, both normal; calcium 9.2  We checked a 24-hour urinary calcium and this was actually low-normal:  Component     Latest Ref Rng & Units 11/01/2017  Creatinine, 24H Ur     0.50 - 2.15 g/24 h 1.11  Calcium, 24H Urine     mg/24 h 56  FECa normal, at 0.06.  Magnesium and phosphorus were normal: Component     Latest Ref Rng & Units 10/23/2017  Phosphorus     2.3 - 4.6 mg/dL 3.2  Magnesium     1.5 - 2.5 mg/dL 2.1   Technetium sestamibi scan (12/11/2017) and this was positive for a left inferior parathyroid adenoma  Thyroid ultrasound (01/07/2018): 1. Positive for a 1.0 x 0.8 x 0.7 cm hypoechoic  solid nodule deep to the left inferior gland in the region of abnormal sestamibi retention. Findings are consistent with hyperfunctioning parathyroid adenoma. 2. Diffusely heterogeneous and mildly enlarged thyroid gland with incidental small thyroid nodules bilaterally. None of the nodules meet criteria for further evaluation.  Patient had left inferior parathyroidectomy on 02/28/2018 -Dr. Harlow Asa. Pathology: 0.429 g parathyroid adenoma measuring 1.7 x 1.2 x 0.4 cm  PTH and calcium decreased after surgery as follows: Calcium from 11.1 >> 9.3, PTH from 103 >> 35.  He feels more energetic after the sx.   Other history reviewed: No history of fractures or osteoporosis. H/o few falls (stumbles  -had a stroke ~2015)  She had one episode of kidney stones in 2014.  He has diabetic nephropathy.  Last BUN/Cr: Lab Results  Component Value Date   BUN 29 (H) 02/25/2018   BUN 27 (H) 10/23/2017   CREATININE 1.41 (H) 02/25/2018   CREATININE 1.41 (H) 10/23/2017   He is not on HCTZ.  Vitamin D deficiency:  He continues 4000 units vitamin D daily.  Latest vitamin D level was normal: Lab Results  Component Value Date   VD25OH 39.93 08/16/2017   VD25OH 19.01 (L) 05/09/2017   No FH of hypercalcemia, pituitary tumors, thyroid cancer, or osteoporosis.   She also has a history of hyperlipidemia,  hypertension, diabetes:  Latest HbA1c was excellent: Lab Results  Component Value Date   HGBA1C 5.8 (H) 02/25/2018   Pt had a right PCA stroke in 08/2017.  He had loss of vision in L eye after the stroke >> resolved.  On Plavix.  Of note, at last visit he also had a high potassium level so we stopped lisinopril.  On recheck, his potassium level was normal.  ROS: Constitutional: + weight gain/no weight loss, no fatigue, no subjective hyperthermia, no subjective hypothermia Eyes: no blurry vision, no xerophthalmia ENT: no sore throat, no nodules palpated in neck, no dysphagia, no odynophagia, no  hoarseness Cardiovascular: no CP/no SOB/no palpitations/no leg swelling Respiratory: no cough/no SOB/no wheezing Gastrointestinal: no N/no V/no D/no C/no acid reflux Musculoskeletal: no muscle aches/no joint aches Skin: no rashes, no hair loss Neurological: no tremors/no numbness/no tingling/no dizziness  I reviewed pt's medications, allergies, PMH, social hx, family hx, and changes were documented in the history of present illness. Otherwise, unchanged from my initial visit note. Changed from Zocor to Lipitor.  Past Medical History:  Diagnosis Date  . Cataract    in both eyes    had cataracts surgically removed  . Complication of anesthesia    reports during prostate bx year ago , his BP "dropped real low" and he had to stay overnight after what was suposed to be an amulatory surgery   . CVA (cerebral infarction)   . Diabetes mellitus   . ED (erectile dysfunction)   . Elevated PSA   . Frequency of urination   . GERD (gastroesophageal reflux disease)   . History of nephrolithiasis   . Hyperlipidemia   . Hypertension   . Lung nodule   . Psoriasis   . PSVT (paroxysmal supraventricular tachycardia) ([place])   . Stroke ([place])    2015  . Stroke ([place]) 08/2017   reports no lasting deficits since stroke, endorses that he feels back to regular self since before stroke    Past Surgical History:  Procedure Laterality Date  . CATARACT EXTRACTION W/ INTRAOCULAR LENS IMPLANT Right 03/30/12   Dr Kathrin Penner  . COLONOSCOPY     most recent 2019  . 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 NM MYOVIEW LTD     normal EF 56% 03/08  . PARATHYROIDECTOMY Left 02/28/2018   Procedure: LEFT INFERIOR PARATHYROIDECTOMY;  Surgeon: Armandina Gemma, MD;  Location: [facility];  Service: General;  Laterality: Left;  . POLYPECTOMY    . PROSTATE BIOPSY  01/05/2012   Procedure: BIOPSY TRANSRECTAL ULTRASONIC PROSTATE (TUBP);  Surgeon: [name], MD;  Location: [facility];   Service: Urology;  Laterality: N/A;  . TEE WITHOUT CARDIOVERSION N/A 05/21/2014   Procedure: TRANSESOPHAGEAL ECHOCARDIOGRAM (TEE);  Surgeon: Jerline Pain, MD;  Location: [place];  Service: Cardiovascular;  Laterality: N/A;  . TONSILLECTOMY  1960   Social History   Socioeconomic History  . Marital status: Married    Spouse name: Peter Congo  . Number of children: 2  . Years of education: HS  . Highest education level: Not on file  Occupational History  . 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 Financial resource strain: Not on file  . Food insecurity:    Worry: Not on file    Inability: Not on file  . Transportation needs:    Medical: Not on file    Non-medical: Not on file  Tobacco Use  . Smoking status:  Former Smoker    Packs/day: 1.00    Years: 5.00    Pack years: 5.00    Types: Cigarettes  . Smokeless tobacco: Current User    Types: Chew  . Tobacco comment: QUIT SMOKING CIGARETTES 50 YRS AGO--  CHEWED TOBACCO FOR 40 YRS  Substance and Sexual Activity  . Alcohol use: No    Alcohol/week: 0.0 standard drinks  . Drug use: No  . Sexual activity: Not Currently  Lifestyle  . Physical activity:    Days per week: Not on file    Minutes per session: Not on file  . Stress: Not on file  Relationships  . Social connections:    Talks on phone: Not on file    Gets together: Not on file    Attends religious service: Not on file    Active member of club or organization: Not on file    Attends meetings of clubs or organizations: Not on file    Relationship status: Not on file  . Intimate partner violence:    Fear of current or ex partner: Not on file    Emotionally abused: Not on file    Physically abused: Not on file    Forced sexual activity: Not on file  Other Topics Concern  . Not on file  Social History Narrative   Has living will   Requests wife as health care POA.   Would accept resuscitation attempts   Not sure about tube feeds       Lives at home with his wife   Right handed    Caffeine: occasional    Current Outpatient Medications on File Prior to Visit  Medication Sig Dispense Refill  . amLODipine (NORVASC) 10 MG tablet TAKE 1 TABLET BY MOUTH EVERY DAY (Patient taking differently: Take 10 mg by mouth daily. ) 90 tablet 3  . aspirin EC 81 MG tablet Take 81 mg by mouth daily.    Marland Kitchen atorvastatin (LIPITOR) 80 MG tablet TAKE 1 TABLET BY MOUTH EVERY EVENING AT 6 PM (Patient taking differently: Take 80 mg by mouth daily at 6 PM. ) 90 tablet 3  . Cholecalciferol (VITAMIN D3) 50 MCG (2000 UT) TABS Take 4,000 Units by mouth daily.    . clopidogrel (PLAVIX) 75 MG tablet Take 1 tablet (75 mg total) by mouth daily. 30 tablet 11  . glipiZIDE (GLUCOTROL XL) 5 MG 24 hr tablet TAKE 1 TABLET BY MOUTH EVERY DAY WITH BREAKFAST (Patient taking differently: Take 5 mg by mouth daily with breakfast. ) 90 tablet 3  . glucose blood (ONETOUCH VERIO) test strip Use 1 strip daily to check blood sugar Dx Code E11.21 100 each 3  . ketoconazole (NIZORAL) 2 % shampoo APPLY 1 APPLICATION TOPICALLY 2 (TWO) TIMES A WEEK. (Patient taking differently: Apply 1 application topically 2 (two) times a week. ) 120 mL 2  . lisinopril (PRINIVIL,ZESTRIL) 40 MG tablet TAKE 1 TABLET BY MOUTH EVERY DAY 90 tablet 0  . metFORMIN (GLUCOPHAGE) 1000 MG tablet TAKE 1 TABLET BY MOUTH TWICE A DAY WITH A MEAL 180 tablet 3  . mometasone (ELOCON) 0.1 % cream APPLY 1 APPLICATION TOPICALLY 2 (TWO) TIMES DAILY AS NEEDED (SKIN). 45 g 1  . ONETOUCH DELICA LANCETS FINE MISC Use 1 daily to obtain blood sample Dx Code E11.21 100 each 3  . traMADol (ULTRAM) 50 MG tablet Take 1-2 tablets (50-100 mg total) by mouth every 6 (six) hours as needed. 15 tablet 0  . triamcinolone cream (KENALOG) 0.1 %  Apply 1 application topically 2 (two) times daily as needed (itching). 30 g 1   No current facility-administered medications on file prior to visit.    No Known Allergies Family History  Problem  Relation Age of Onset  . Cancer Mother        breast cancer  . Diabetes Mother   . Colon polyps Brother   . Heart disease Neg Hx   . Colon cancer Neg Hx   . Esophageal cancer Neg Hx   . Rectal cancer Neg Hx   . Stomach cancer Neg Hx     PE: BP 128/70   Pulse 70   Ht 5\' 8"  (1.727 m) Comment: measured  Wt 170 lb (77.1 kg)   SpO2 98%   BMI 25.85 kg/m  Wt Readings from Last 3 Encounters:  04/24/18 170 lb (77.1 kg)  02/28/18 164 lb (74.4 kg)  02/25/18 164 lb (74.4 kg)   Constitutional: Normal weight, in NAD Eyes: PERRLA, EOMI, no exophthalmos ENT: moist mucous membranes, no thyromegaly, no cervical lymphadenopathy Cardiovascular: RRR, No MRG Respiratory: CTA B Gastrointestinal: abdomen soft, NT, ND, BS+ Musculoskeletal: no deformities, strength intact in all 4 Skin: moist, warm, no rashes Neurological: no tremor with outstretched hands, DTR normal in all 4  Assessment: 1. Hypercalcemia/hyperparathyroidism  2. Vitamin D def  3. Hyperkalemia  Plan: Patient with history of elevated calcium with the highest being at 11.1 a year ago.  A corresponding intact PTH was also high, at 103.  However, at the same time, his vitamin D was very low, at 19.  We started to repeat his vitamin D and his most recent level was normal. Calcium remained elevated.  He has a history of one episode of nephrolithiasis.  At last visit he did not complain of abdominal pain, depression, bone pain.  Also, no history of osteoporosis or fractures.   At last visit we investigating him for primary hyperparathyroidism: Calcium was high, PTH was inappropriately suppressed, vitamin D was normal, magnesium, phosphorus, and 24-hour urine calcium were also normal.  A technetium sestamibi parathyroid scan was positive for left inferior parathyroid adenoma.  At that time, I referred him to surgery.  He had a thyroid ultrasound performed that confirmed the left inferior parathyroid adenoma.  He had parathyroidectomy on  02/28/2018 and the pathology was positive for 0.4 g parathyroid adenoma.  Postop, both calcium and PTH were normal.  Most recent calcium was reviewed and this was normal on 02/25/2018. -At this visit, we will recheck his calcium, PTH, and vitamin D level -I will see the patient back in a year.  If labs are normal then, he can continue to follow up with PCP with annual vitamin D and calcium levels.  2. Vitamin D deficiency  -Reviewed together latest vitamin D level, which was normal, in 07/2017 -He continues on 4000 units vitamin D daily -Recheck the level today  3. Hyperkalemia - I advised him to stop lisinopril at last OV.  He did so and potassium normalized afterwards. - At this visit, patient and his wife tell me that he is still on lisinopril, but they are not sure so they will look at home - last potasium normal, but at the upper limit of normal, at 5.1  - 02/25/2018. - I advised him to clarify the dose with Dr. Silvio Pate.  He has an appointment coming up.  Office Visit on 04/24/2018  Component Date Value Ref Range Status  . VITD 04/24/2018 58.50  30.00 - 100.00 ng/mL Final  .  Calcium 04/24/2018 9.2  8.6 - 10.2 mg/dL Final  . PTH 04/24/2018 33  15 - 65 pg/mL Final  . PTH Interp 04/24/2018 Comment   Final   Comment: Interpretation                 Intact PTH    Calcium                                 (pg/mL)      (mg/dL) Normal                          15 - 65     8.6 - 10.2 Primary Hyperparathyroidism         >65          >10.2 Secondary Hyperparathyroidism       >65          <10.2 Non-Parathyroid Hypercalcemia       <65          >10.2 Hypoparathyroidism                  <15          < 8.6 Non-Parathyroid Hypocalcemia    15 - 65          < 8.6    All labs normal.  Philemon Kingdom, MD PhD [facility]

## 2018-04-25 LAB — PTH, INTACT AND CALCIUM
Calcium: 9.2 mg/dL (ref 8.6–10.2)
PTH: 33 pg/mL (ref 15–65)

## 2018-05-15 ENCOUNTER — Encounter: Payer: Medicare HMO | Admitting: Internal Medicine

## 2018-05-20 DIAGNOSIS — R69 Illness, unspecified: Secondary | ICD-10-CM | POA: Diagnosis not present

## 2018-06-13 ENCOUNTER — Other Ambulatory Visit: Payer: Self-pay | Admitting: Internal Medicine

## 2018-07-03 DIAGNOSIS — R972 Elevated prostate specific antigen [PSA]: Secondary | ICD-10-CM | POA: Diagnosis not present

## 2018-07-10 DIAGNOSIS — R972 Elevated prostate specific antigen [PSA]: Secondary | ICD-10-CM | POA: Diagnosis not present

## 2018-07-10 DIAGNOSIS — N401 Enlarged prostate with lower urinary tract symptoms: Secondary | ICD-10-CM | POA: Diagnosis not present

## 2018-07-10 DIAGNOSIS — R350 Frequency of micturition: Secondary | ICD-10-CM | POA: Diagnosis not present

## 2018-07-14 ENCOUNTER — Other Ambulatory Visit: Payer: Self-pay | Admitting: Internal Medicine

## 2018-08-05 DIAGNOSIS — R69 Illness, unspecified: Secondary | ICD-10-CM | POA: Diagnosis not present

## 2018-08-20 DIAGNOSIS — R972 Elevated prostate specific antigen [PSA]: Secondary | ICD-10-CM | POA: Diagnosis not present

## 2018-08-22 DIAGNOSIS — R69 Illness, unspecified: Secondary | ICD-10-CM | POA: Diagnosis not present

## 2018-08-27 ENCOUNTER — Ambulatory Visit (INDEPENDENT_AMBULATORY_CARE_PROVIDER_SITE_OTHER): Payer: Medicare HMO | Admitting: Internal Medicine

## 2018-08-27 ENCOUNTER — Other Ambulatory Visit: Payer: Self-pay

## 2018-08-27 ENCOUNTER — Encounter: Payer: Self-pay | Admitting: Internal Medicine

## 2018-08-27 VITALS — BP 124/70 | HR 74 | Temp 98.3°F | Ht 69.0 in | Wt 165.0 lb

## 2018-08-27 DIAGNOSIS — Z0000 Encounter for general adult medical examination without abnormal findings: Secondary | ICD-10-CM | POA: Diagnosis not present

## 2018-08-27 DIAGNOSIS — L219 Seborrheic dermatitis, unspecified: Secondary | ICD-10-CM

## 2018-08-27 DIAGNOSIS — I471 Supraventricular tachycardia: Secondary | ICD-10-CM

## 2018-08-27 DIAGNOSIS — N4231 Prostatic intraepithelial neoplasia: Secondary | ICD-10-CM

## 2018-08-27 DIAGNOSIS — E1122 Type 2 diabetes mellitus with diabetic chronic kidney disease: Secondary | ICD-10-CM | POA: Diagnosis not present

## 2018-08-27 DIAGNOSIS — Z7189 Other specified counseling: Secondary | ICD-10-CM | POA: Diagnosis not present

## 2018-08-27 DIAGNOSIS — I10 Essential (primary) hypertension: Secondary | ICD-10-CM | POA: Diagnosis not present

## 2018-08-27 DIAGNOSIS — N183 Chronic kidney disease, stage 3 (moderate): Secondary | ICD-10-CM | POA: Diagnosis not present

## 2018-08-27 DIAGNOSIS — I6930 Unspecified sequelae of cerebral infarction: Secondary | ICD-10-CM

## 2018-08-27 LAB — CBC
HCT: 39 % (ref 39.0–52.0)
Hemoglobin: 13.1 g/dL (ref 13.0–17.0)
MCHC: 33.6 g/dL (ref 30.0–36.0)
MCV: 92.4 fl (ref 78.0–100.0)
Platelets: 241 10*3/uL (ref 150.0–400.0)
RBC: 4.22 Mil/uL (ref 4.22–5.81)
RDW: 13.3 % (ref 11.5–15.5)
WBC: 6.3 10*3/uL (ref 4.0–10.5)

## 2018-08-27 LAB — RENAL FUNCTION PANEL
Albumin: 4.5 g/dL (ref 3.5–5.2)
BUN: 29 mg/dL — ABNORMAL HIGH (ref 6–23)
CO2: 26 mEq/L (ref 19–32)
Calcium: 9.2 mg/dL (ref 8.4–10.5)
Chloride: 108 mEq/L (ref 96–112)
Creatinine, Ser: 1.42 mg/dL (ref 0.40–1.50)
GFR: 48.25 mL/min — ABNORMAL LOW (ref >60.00)
Glucose, Bld: 98 mg/dL (ref 70–99)
Phosphorus: 3.2 mg/dL (ref 2.3–4.6)
Potassium: 4.7 mEq/L (ref 3.5–5.1)
Sodium: 142 mEq/L (ref 135–145)

## 2018-08-27 LAB — LIPID PANEL
Cholesterol: 131 mg/dL (ref 0–200)
HDL: 46.9 mg/dL (ref >39.00)
LDL Cholesterol: 57 mg/dL (ref 0–99)
NonHDL: 84.26
Total CHOL/HDL Ratio: 3
Triglycerides: 136 mg/dL (ref 0.0–149.0)
VLDL: 27.2 mg/dL (ref 0.0–40.0)

## 2018-08-27 LAB — HEPATIC FUNCTION PANEL
ALT: 21 U/L (ref 0–53)
AST: 17 U/L (ref 0–37)
Albumin: 4.5 g/dL (ref 3.5–5.2)
Alkaline Phosphatase: 57 U/L (ref 39–117)
Bilirubin, Direct: 0.2 mg/dL (ref 0.0–0.3)
Total Bilirubin: 0.9 mg/dL (ref 0.2–1.2)
Total Protein: 6.6 g/dL (ref 6.0–8.3)

## 2018-08-27 LAB — HM DIABETES FOOT EXAM

## 2018-08-27 LAB — HEMOGLOBIN A1C: Hgb A1c MFr Bld: 6.3 % (ref 4.6–6.5)

## 2018-08-27 MED ORDER — KETOCONAZOLE 2 % EX SHAM: MEDICATED_SHAMPOO | CUTANEOUS | 5 refills | Status: DC

## 2018-08-27 MED ORDER — HYDROCORTISONE 2.5 % EX CREA: TOPICAL_CREAM | Freq: Three times a day (TID) | CUTANEOUS | 3 refills | Status: DC | PRN

## 2018-08-27 NOTE — Assessment & Plan Note (Signed)
See social history 

## 2018-08-27 NOTE — Assessment & Plan Note (Signed)
On ACEI Will check labs

## 2018-08-27 NOTE — Assessment & Plan Note (Signed)
Mild balance and cognitive issues persist On plavix, asa, statin

## 2018-08-27 NOTE — Assessment & Plan Note (Signed)
No apparent recurrence 

## 2018-08-27 NOTE — Assessment & Plan Note (Signed)
I have personally reviewed the Medicare Annual Wellness questionnaire and have noted 1. The patient's medical and social history 2. Their use of alcohol, tobacco or illicit drugs 3. Their current medications and supplements 4. The patient's functional ability including ADL's, fall risks, home safety risks and hearing or visual             impairment. 5. Diet and physical activities 6. Evidence for depression or mood disorders  The patients weight, height, BMI and visual acuity have been recorded in the chart I have made referrals, counseling and provided education to the patient based review of the above and I have provided the pt with a written personalized care plan for preventive services.  I have provided you with a copy of your personalized plan for preventive services. Please take the time to review along with your updated medication list.  Due for repeat colon due to very large polyp Flu vaccine in the fall Discussed staying fit Consider shingix

## 2018-08-27 NOTE — Assessment & Plan Note (Signed)
Unable to complete recent biopsy Will follow with Dr Alinda Money

## 2018-08-27 NOTE — Assessment & Plan Note (Signed)
meds renewed

## 2018-08-27 NOTE — Assessment & Plan Note (Signed)
BP Readings from Last 3 Encounters:  08/27/18 124/70  04/24/18 128/70  02/28/18 140/83   Good control

## 2018-08-27 NOTE — Assessment & Plan Note (Signed)
Seems to have good control Will check labs 

## 2018-08-27 NOTE — Progress Notes (Signed)
Subjective:    Patient ID: Tony Jackson., male    DOB: 1941/01/11, 78 y.o.   MRN: [id number]  HPI Here for Medicare wellness and follow up of chronic health conditions Reviewed form and advanced directives Reviewed other doctors No alcohol or tobacco Tries to do some exercise Vision is okay Hearing is okay---but wife notes some troubles No falls No depression or anhedonia Independent with instrumental ADLs. 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) Mild memory issues seem fairly stable  Dr Alinda Money was trying another biopsy Got some valium for pretreatment--but he still had drop in BP and had to stop PSA did go up considerably  No palpitations No apparent recurrence of SVT--still has the implanted monitor No chest pain or SOB No sig edema---?slight No dizziness or syncope  No problems with parathyroidectomy  Checks sugars once a week Often under 100---can go to 120 No hypoglycemic reactions Known CKD ---due for recheck (doesn't see specialist)  Still has to be careful with balance No vision problems ----very brief problem with last stroke Continues on plavix  Current Outpatient Medications on File Prior to Visit  Medication Sig Dispense Refill  . amLODipine (NORVASC) 10 MG tablet TAKE 1 TABLET BY MOUTH EVERY DAY (Patient taking differently: Take 10 mg by mouth daily. ) 90 tablet 3  . aspirin EC 81 MG tablet Take 81 mg by mouth daily.    Marland Kitchen atorvastatin (LIPITOR) 80 MG tablet TAKE 1 TABLET BY MOUTH EVERY EVENING AT 6 PM (Patient taking differently: Take 80 mg by mouth daily at 6 PM. ) 90 tablet 3  . Cholecalciferol (VITAMIN D3) 50 MCG (2000 UT) TABS Take 4,000 Units by mouth daily.    . clopidogrel (PLAVIX) 75 MG tablet Take 1 tablet (75 mg total) by mouth daily. 30 tablet 11  . glipiZIDE (GLUCOTROL XL) 5 MG 24 hr tablet TAKE 1 TABLET BY MOUTH EVERY DAY WITH BREAKFAST (Patient taking differently: Take 5 mg by  mouth daily with breakfast. ) 90 tablet 3  . glucose blood (ONETOUCH VERIO) test strip Use 1 strip daily to check blood sugar Dx Code E11.21 100 each 3  . ketoconazole (NIZORAL) 2 % shampoo APPLY 1 APPLICATION TOPICALLY 2 (TWO) TIMES A WEEK. 120 mL 2  . lisinopril (ZESTRIL) 40 MG tablet TAKE 1 TABLET BY MOUTH EVERY DAY 90 tablet 1  . metFORMIN (GLUCOPHAGE) 1000 MG tablet TAKE 1 TABLET BY MOUTH TWICE A DAY WITH A MEAL 180 tablet 3  . mometasone (ELOCON) 0.1 % cream APPLY 1 APPLICATION TOPICALLY 2 (TWO) TIMES DAILY AS NEEDED (SKIN). 45 g 1  . ONETOUCH DELICA LANCETS FINE MISC Use 1 daily to obtain blood sample Dx Code E11.21 100 each 3  . triamcinolone cream (KENALOG) 0.1 % Apply 1 application topically 2 (two) times daily as needed (itching). 30 g 1  . traMADol (ULTRAM) 50 MG tablet Take 1-2 tablets (50-100 mg total) by mouth every 6 (six) hours as needed. (Patient not taking: Reported on 04/24/2018) 15 tablet 0   No current facility-administered medications on file prior to visit.     No Known Allergies  Past Medical History:  Diagnosis Date  . Cataract    in both eyes    had cataracts surgically removed  . Complication of anesthesia    reports during prostate bx year ago , his BP "dropped real low" and he had to stay overnight after what was suposed to be  an amulatory surgery   . CVA (cerebral infarction)   . Diabetes mellitus   . ED (erectile dysfunction)   . Elevated PSA   . Frequency of urination   . GERD (gastroesophageal reflux disease)   . History of nephrolithiasis   . Hyperlipidemia   . Hypertension   . Lung nodule   . Psoriasis   . PSVT (paroxysmal supraventricular tachycardia) ([place])   . Stroke ([place])    2015  . Stroke ([place]) 08/2017   reports no lasting deficits since stroke, endorses that he feels back to regular self since before stroke     Past Surgical History:  Procedure Laterality Date  . CATARACT EXTRACTION W/ INTRAOCULAR LENS IMPLANT Right 03/30/12   Dr  Kathrin Penner  . COLONOSCOPY     most recent 2019  . 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 NM MYOVIEW LTD     normal EF 56% 03/08  . PARATHYROIDECTOMY Left 02/28/2018   Procedure: LEFT INFERIOR PARATHYROIDECTOMY;  Surgeon: Armandina Gemma, MD;  Location: [facility];  Service: General;  Laterality: Left;  . POLYPECTOMY    . PROSTATE BIOPSY  01/05/2012   Procedure: BIOPSY TRANSRECTAL ULTRASONIC PROSTATE (TUBP);  Surgeon: [name], MD;  [facility];  Service: Urology;  Laterality: N/A;  . TEE WITHOUT CARDIOVERSION N/A 05/21/2014   Procedure: TRANSESOPHAGEAL ECHOCARDIOGRAM (TEE);  Surgeon: Jerline Pain, MD;  Location: [facility];  Service: Cardiovascular;  Laterality: N/A;  . TONSILLECTOMY  1960    Family History  Problem Relation Age of Onset  . Cancer Mother        breast cancer  . Diabetes Mother   . Colon polyps Brother   . Heart disease Neg Hx   . Colon cancer Neg Hx   . Esophageal cancer Neg Hx   . Rectal cancer Neg Hx   . Stomach cancer Neg Hx     Social History   Socioeconomic History  . Marital status: Married    Spouse name: Peter Congo  . Number of children: 2  . Years of education: HS  . Highest education level: Not on file  Occupational History  . 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 Financial resource strain: Not on file  . Food insecurity    Worry: Not on file    Inability: Not on file  . Transportation needs    Medical: Not on file    Non-medical: Not on file  Tobacco Use  . Smoking status: Former Smoker    Packs/day: 1.00    Years: 5.00    Pack years: 5.00    Types: Cigarettes  . Smokeless tobacco: Current User    Types: Chew  . Tobacco comment: QUIT SMOKING CIGARETTES 50 YRS AGO--  CHEWED TOBACCO FOR 40 YRS  Substance and Sexual Activity  . Alcohol use: No    Alcohol/week: 0.0 standard drinks  . Drug use: No  . Sexual activity: Not Currently  Lifestyle  .  Physical activity    Days per week: Not on file    Minutes per session: Not on file  . Stress: Not on file  Relationships  . Social Herbalist on phone: Not on file    Gets together: Not on file    Attends religious service: Not on file    Active member of club or organization: Not on file    Attends meetings of clubs or organizations: Not  on file    Relationship status: Not on file  . Intimate partner violence    Fear of current or ex partner: Not on file    Emotionally abused: Not on file    Physically abused: Not on file    Forced sexual activity: Not on file  Other Topics Concern  . Not on file  Social History Narrative   Has living will   Requests wife as health care POA.   Would accept resuscitation attempts   Not sure about tube feeds      Lives at home with his wife   Right handed    Caffeine: occasional    Review of Systems Appetite is good---less than in the past Weight is stable Sleeps well Wears seat belt Teeth okay--keeps up with dentist Has some skin issues on face---peeling. Plans to see dermatologist Bowels are fine. No blood Slow urine stream--especially in AM. Some frequency. Seems to empty okay No sig back or joint pain Rare heartburn. No dysphagia    Objective:   Physical Exam  Constitutional: He is oriented to person, place, and time. He appears well-developed. No distress.  HENT:  Mouth/Throat: Oropharynx is clear and moist. No oropharyngeal exudate.  Neck: No thyromegaly present.  Cardiovascular: Normal rate, regular rhythm, normal heart sounds and intact distal pulses. Exam reveals no gallop.  No murmur heard. Respiratory: Effort normal and breath sounds normal. No respiratory distress. He has no wheezes. He has no rales.  GI: Soft. There is no abdominal tenderness.  Musculoskeletal:        General: No tenderness or edema.  Lymphadenopathy:    He has no cervical adenopathy.  Neurological: He is alert and oriented to person,  place, and time.  President-- "Trump, Obama, Bush" [id number] Recall 3/3  Fairly normal sensation in feet  Skin:  seb derm on lower forehead and top of nose  Psychiatric: He has a normal mood and affect. His behavior is normal.           Assessment & Plan:

## 2018-08-29 ENCOUNTER — Other Ambulatory Visit: Payer: Self-pay | Admitting: Urology

## 2018-09-19 ENCOUNTER — Other Ambulatory Visit (HOSPITAL_COMMUNITY): Payer: Self-pay | Admitting: Urology

## 2018-09-19 DIAGNOSIS — R972 Elevated prostate specific antigen [PSA]: Secondary | ICD-10-CM

## 2018-10-09 NOTE — Patient Instructions (Signed)
YOU NEED TO HAVE A COVID 19 TEST ON__8/20_____ @ 9:05_______, THIS TEST MUST BE DONE BEFORE SURGERY, COME  [name], [name] , [id number]. ONCE YOUR COVID TEST IS COMPLETED,  PLEASE BEGIN THE QUARANTINE INSTRUCTIONS AS OUTLINED IN YOUR HANDOUT.                Tony Jackson.   Your procedure is scheduled BP:ZWCHEN 10/14/18    Report to [facility]  Entrance Report to admitting at  11:15 AM   1 VISITOR IS ALLOWED TO WAIT IN WAITING ROOM  ONLY DAY OF YOUR SURGERY.  NO VISITORS ARE ALLOWED IN SHORT STAY OR RECOVERY ROOM.   Call this number if you have problems the morning of surgery [place] AND RINSE YOUR MOUTH OUT, NO CHEWING GUM CANDY OR MINTS.     Take these medicines the morning of surgery with A SIP OF WATER: Amlodipine  How to Manage Your Diabetes Before and After Surgery  Why is it important to control my blood sugar before and after surgery? . Improving blood sugar levels before and after surgery helps healing and can limit problems. . A way of improving blood sugar control is eating a healthy diet by: o  Eating less sugar and carbohydrates o  Increasing activity/exercise o  Talking with your doctor about reaching your blood sugar goals . High blood sugars (greater than 180 mg/dL) can raise your risk of infections and slow your recovery, so you will need to focus on controlling your diabetes during the weeks before surgery. . Make sure that the doctor who takes care of your diabetes knows about your planned surgery including the date and location.  How do I manage my blood sugar before surgery? . Check your blood sugar at least 4 times a day, starting 2 days before surgery, to make sure that the level is not too high or low. o Check your blood sugar the morning of your surgery when you wake up and every 2 hours until you get to the Short Stay unit. . If your blood sugar is less than 70 mg/dL, you will  need to treat for low blood sugar: o Do not take insulin. o Treat a low blood sugar (less than 70 mg/dL) with  cup of clear juice (cranberry or apple), 4 glucose tablets, OR glucose gel. o Recheck blood sugar in 15 minutes after treatment (to make sure it is greater than 70 mg/dL). If your blood sugar is not greater than 70 mg/dL on recheck, call [phone] for further instructions. . Report your blood sugar to the short stay nurse when you get to Short Stay.  . If you are admitted to the hospital after surgery: o Your blood sugar will be checked by the staff and you will probably be given insulin after surgery (instead of oral diabetes medicines) to make sure you have good blood sugar levels. o The goal for blood sugar control after surgery is 80-180 mg/dL.   WHAT DO I DO ABOUT MY DIABETES MEDICATION           DO NOT TAKE ANY DIABETIC MEDICATIONS DAY OF YOUR SURGERY                                 You may not have any metal on your body including piercings  Do not wear jewelry,, lotions, powders or , deodorant             Do not wear nail polish.  Do not shave  48 hours prior to surgery.              Men may shave face and neck.   Do not bring valuables to the hospital. [place].  Contacts, dentures or bridgework may not be worn into surgery.      Patients discharged the day of surgery will not be allowed to drive home . IF YOU ARE HAVING SURGERY AND GOING HOME THE SAME DAY, YOU MUST HAVE AN ADULT TO DRIVE YOU HOME AND BE WITH YOU FOR 24 HOURS.  YOU MAY GO HOME BY TAXI OR UBER OR ORTHERWISE, BUT AN ADULT MUST ACCOMPANY YOU HOME AND STAY WITH YOU FOR 24 HOURS.  Name and phone number of your driver:  Special Instructions: N/A              Please read over the following fact sheets you were given: _____________________________________________________________________  [facility] - Preparing for Surgery Before  surgery, you can play an important role   Because skin is not sterile, your skin needs to be as free of germs as possible.   You can reduce the number of germs on your skin by washing with CHG (chlorahexidine gluconate) soap before surgery.   CHG is an antiseptic cleaner which kills germs and bonds with the skin to continue killing germs even after washing. Please DO NOT use if you have an allergy to CHG or antibacterial soaps.   If your skin becomes reddened/irritated stop using the CHG and inform your nurse when you arrive at Short Stay.  You may shave your face/neck. Please follow these instructions carefully:   1.  Shower with CHG Soap the night before surgery and the  morning of Surgery.  2.  If you choose to wash your hair, wash your hair first as usual with your  normal  shampoo.  3.  After you shampoo, rinse your hair and body thoroughly to remove the  shampoo.                                        4.  Use CHG as you would any other liquid soap.  You can apply chg directly  to the skin and wash                       Gently with a scrungie or clean washcloth.  5.  Apply the CHG Soap to your body ONLY FROM THE NECK DOWN.   Do not use on face/ open                           Wound or open sores. Avoid contact with eyes, ears mouth and genitals (private parts).                       Wash face,  Genitals (private parts) with your normal soap.             6.  Wash thoroughly, paying special attention to the area where your surgery  will be performed.  7.  Thoroughly rinse your body with warm water from the neck down.  8.  DO NOT shower/wash with your normal soap after using and rinsing off  the CHG Soap.             9.  Pat yourself dry with a clean towel.            10.  Wear clean pajamas.            11.  Place clean sheets on your bed the night of your first shower and do not  sleep with pets. Day of Surgery : Do not apply any lotions/deodorants the morning of surgery.  Please wear clean  clothes to the hospital/surgery center.   FAILURE TO FOLLOW THESE INSTRUCTIONS MAY RESULT IN THE CANCELLATION OF YOUR SURGERY PATIENT SIGNATURE_________________________________  NURSE SIGNATURE__________________________________  ________________________________________________________________________

## 2018-10-10 ENCOUNTER — Other Ambulatory Visit: Payer: Self-pay

## 2018-10-10 ENCOUNTER — Encounter (HOSPITAL_COMMUNITY): Payer: Self-pay

## 2018-10-10 ENCOUNTER — Other Ambulatory Visit (HOSPITAL_COMMUNITY)
Admission: RE | Admit: 2018-10-10 | Discharge: 2018-10-10 | Disposition: A | Discharge status: Home / Self Care | Payer: Medicare HMO | Source: Ambulatory Visit | Attending: Urology | Admitting: Urology

## 2018-10-10 ENCOUNTER — Encounter (HOSPITAL_COMMUNITY)
Admission: RE | Admit: 2018-10-10 | Discharge: 2018-10-10 | Disposition: A | Discharge status: Home / Self Care | Payer: Medicare HMO | Source: Ambulatory Visit | Attending: Urology | Admitting: Urology

## 2018-10-10 DIAGNOSIS — E118 Type 2 diabetes mellitus with unspecified complications: Secondary | ICD-10-CM | POA: Diagnosis not present

## 2018-10-10 DIAGNOSIS — R972 Elevated prostate specific antigen [PSA]: Secondary | ICD-10-CM | POA: Insufficient documentation

## 2018-10-10 DIAGNOSIS — Z20828 Contact with and (suspected) exposure to other viral communicable diseases: Secondary | ICD-10-CM | POA: Insufficient documentation

## 2018-10-10 DIAGNOSIS — Z01818 Encounter for other preprocedural examination: Secondary | ICD-10-CM | POA: Insufficient documentation

## 2018-10-10 LAB — BASIC METABOLIC PANEL
Anion gap: 7 (ref 5–15)
BUN: 34 mg/dL — ABNORMAL HIGH (ref 8–23)
CO2: 24 mmol/L (ref 22–32)
Calcium: 9.1 mg/dL (ref 8.9–10.3)
Chloride: 111 mmol/L (ref 98–111)
Creatinine, Ser: 1.66 mg/dL — ABNORMAL HIGH (ref 0.61–1.24)
GFR calc Af Amer: 45 mL/min — ABNORMAL LOW (ref >60)
GFR calc non Af Amer: 39 mL/min — ABNORMAL LOW (ref >60)
Glucose, Bld: 121 mg/dL — ABNORMAL HIGH (ref 70–99)
Potassium: 5.1 mmol/L (ref 3.5–5.1)
Sodium: 142 mmol/L (ref 135–145)

## 2018-10-10 LAB — GLUCOSE, CAPILLARY: Glucose-Capillary: 108 mg/dL — ABNORMAL HIGH (ref 70–99)

## 2018-10-10 LAB — CBC
HCT: 36.7 % — ABNORMAL LOW (ref 39.0–52.0)
Hemoglobin: 12 g/dL — ABNORMAL LOW (ref 13.0–17.0)
MCH: 31.3 pg (ref 26.0–34.0)
MCHC: 32.7 g/dL (ref 30.0–36.0)
MCV: 95.6 fL (ref 80.0–100.0)
Platelets: 222 10*3/uL (ref 150–400)
RBC: 3.84 MIL/uL — ABNORMAL LOW (ref 4.22–5.81)
RDW: 12.9 % (ref 11.5–15.5)
WBC: 6.1 10*3/uL (ref 4.0–10.5)
nRBC: 0 % (ref 0.0–0.2)

## 2018-10-10 LAB — SARS CORONAVIRUS 2 (TAT 6-24 HRS): SARS Coronavirus 2: NEGATIVE

## 2018-10-10 LAB — HEMOGLOBIN A1C
Hgb A1c MFr Bld: 5.9 % — ABNORMAL HIGH (ref 4.8–5.6)
Mean Plasma Glucose: 122.63 mg/dL

## 2018-10-10 NOTE — Progress Notes (Addendum)
Patient did not receive instructions about when to stop Plavix and ASA.I told him to call the surgeon today and ask because his surgery is Monday 8/24. His last dose of ASA and Plavix was 10/10/18.

## 2018-10-11 NOTE — H&P (Signed)
1. Elevated PSA  2. BPH/LUTS   He follows up today for continued evaluation of his elevated PSA. One year ago, he did undergo an MRI of the prostate due to his PSA further increasing to 20.1. This MRI did demonstrate 2 separate PI RADS 3 lesions and a 2.5 cm PI RADS 4 lesion at the right mid peripheral zone. Based on that information, we had tentatively scheduled him to proceed with an MR/ultrasound fusion biopsy last summer. However, he subsequently suffered a stroke and we canceled his biopsy procedure. He followed up last November and his PSA had decreased slightly to 17.2. As such, we elected to continue to monitor his PSA based on his recent stroke and the fact that his PSA had decreased. Furthermore, he has had significant difficulties with prior biopsies that have resulted in syncopal episodes and hypotension requiring hospitalization both after procedures in the office and in the outpatient surgery center. He returns today and states that he has had no change in his voiding symptoms. He has also had no significant changes in his overall health history. He remains on Plavix and aspirin 81 mg.     ALLERGIES: No Allergies    MEDICATIONS: Aspirin 81 mg tablet,chewable  Plavix  Amlodipine Besylate 10 mg tablet  Atorvastatin Calcium  Glipizide Xl 5 mg tablet, extended release 24 hr Oral  Lisinopril 10 mg tablet Oral  Metformin Hcl 1,000 mg tablet Oral  Mometasone Furoate 0.1 % cream External  Sildenafil Citrate 20 MG Oral Tablet 2 Oral As needed  Taclonex 0.005 %-0.064 % ointment External     GU PSH: Prostate Needle Biopsy - 2013      PSH Notes: Cataract Surgery, Biopsy Of The Prostate Needle   NON-GU PSH: Cataract Surgery.., Right    GU PMH: Urinary Frequency (Stable) - 2018, Increased urinary frequency, - 2014 ED due to arterial insufficiency, Erectile dysfunction due to arterial insufficiency - 2017 BPH w/LUTS, Benign prostatic hyperplasia (BPH) with straining on urination -  2017 Elevated PSA, Elevated prostate specific antigen (PSA) - 2017 Dysuria, Dysuria - 2014 Renal calculus, Nephrolithiasis - 2014      PMH Notes:   1) Elevated PSA: He was initially evaluated in October 2010 for an elevated PSA 8.5 (15% free) prompting a prostate biopsy which was negative. A planned 12-core biopsy was discontinued after 10 cores had been obtained due to a syncopal episode which occurred during his biopsy. He was noted to have a further rise of his PSA to 16.1 in October 2013 which was repeated and remained significantly increased at 13.64. 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 He required hospital admission after this procedure for hypotension although did not have infectious complications or symptoms to suggest an obvious etiology. He has had syncopal symptoms in other settings as well with a negative workup.   Feb 2011: 10 core biopsy -- negative, Vol 119 cc  Nov 2013: 26 core saturation biopsy - multifocal HGPIN, Vol 148 cc  May 2019: PSA increased to 20.20 Jun 2017: Prostate MRI - 2.1 cm PI-RADS 3 lesion in right central gland at base, 2.5 cm PI-RADS 4 lesion at right mid peripheral zone, 2.2 cm PI-RADS 3 lesion at left mid peripheral zone  Jul 2019: Planned MR/US fusion biopsy cancelled due to patient having a stroke   2) BPH/LUTS: His baseline symptoms include urgency, frequency, intermittency, and a weak stream. He drinks about 6 cups of coffee per day  which exacerbate his symptoms. Baseline IPSS was 21 but he has not been bothered enough by his symptoms to warrant medical therapy. Prostate volume is 148 cc by ultrasound.   3) Erectile dysfunction: He has used Cialis in the past with success although has been less sexually active more recently.   4) Urolithiasis: He has spontaneously passed one ureteral stone in 2011.   NON-GU PMH: Diabetes Type 2 Hypercholesterolemia Hypertension     FAMILY HISTORY: Breast Cancer - Mother Heart Disease - Mother, Father   SOCIAL HISTORY: Marital Status: Married Preferred Language: English; Ethnicity: Not Hispanic Or Latino; Race: White Current Smoking Status: Patient does not smoke anymore.   Tobacco Use Assessment Completed: Used Tobacco in last 30 days? Has never drank.  Drinks 2 caffeinated drinks per day.     Notes: Previous History Of Smoking, Marital History - Currently Married, Tobacco Use   REVIEW OF SYSTEMS:    GU Review Male:   Patient denies frequent urination, hard to postpone urination, burning/ pain with urination, get up at night to urinate, leakage of urine, stream starts and stops, trouble starting your streams, and have to strain to urinate .  Gastrointestinal (Lower):   Patient denies diarrhea and constipation.  Gastrointestinal (Upper):   Patient denies nausea and vomiting.  Constitutional:   Patient denies fever, night sweats, weight loss, and fatigue.  Skin:   Patient denies skin rash/ lesion and itching.  Eyes:   Patient denies blurred vision and double vision.  Ears/ Nose/ Throat:   Patient denies sore throat and sinus problems.  Hematologic/Lymphatic:   Patient denies swollen glands and easy bruising.  Cardiovascular:   Patient denies leg swelling and chest pains.  Respiratory:   Patient denies shortness of breath and cough.  Endocrine:   Patient denies excessive thirst.  Musculoskeletal:   Patient denies back pain and joint pain.  Neurological:   Patient denies headaches and dizziness.  Psychologic:   Patient denies depression and anxiety.   VITAL SIGNS:     Weight 170 lb / 77.11 kg  Height 69 in / 175.26 cm  BMI 25.1 kg/m     MULTI-SYSTEM PHYSICAL EXAMINATION:    Constitutional: Well-nourished. No physical deformities. Normally developed. Good grooming.  CV: RRR Lungs: Clear bilaterally    ASSESSMENT:      ICD-10 Details  1 GU:   Elevated PSA - R97.20   2   BPH w/LUTS - N40.1   3    Urinary Frequency - R35.0    PLAN:                1. Elevated PSA: We had again recently attempted to perform a biopsy in the office setting.  However, he again developed hypertension likely related to a vasovagal response upon insertion of the transrectal probe.  He therefore was unable to proceed with the biopsy.  He presents today to have this performed with IV sedation with close monitoring of his blood pressure.

## 2018-10-11 NOTE — Progress Notes (Signed)
Anesthesia Chart Review   Case: [id number] Date/Time: 10/14/18 1300   Procedure: BIOPSY TRANSRECTAL ULTRASONIC PROSTATE (TUBP) (N/A )   Anesthesia type: Monitor Anesthesia Care   Pre-op diagnosis: ELEVATED PROSTATE SPECIFIC ANTIGEN   Location: [facility] ROOM 01 / [facility]   Surgeon: Raynelle Bring, MD      DISCUSSION:78 y.o. former smoker (5 pack years) with h/o DM, PSVT, GERD, HLD, HTN, CVA (mild balance and cognitive issues persist, on Plavix and ASA), PSVT (implanted monitor), CKD Stage III, elevated prostate specific antigen scheduled for above procedure 10/14/2018 with Dr. Raynelle Bring.   Reports during prostate biopsy in 2013 experienced hypotension.  Anesthesia notes reviewed which states, "BP is low and postural. No chest pain. No SOB. Feels dizzy. Discussed with Dr. Alinda Money. The plan is to admit for observation. BP 100/60 reclining, and dropped to 68/40's upon standing. He states he had a normal stress test 2-3 years ago.  He had a similar episode following a prostate biopsy in the office. He went home at that time and did pass out at home. The next day after that episode he was fine. Normal myoview in 03/08 with EF 56%. Repeat ECG at 1849 today is normal."  No anesthesia complicated noted with parathyroidectomy 02/28/2018.    Anticipate pt can proceed with planned procedure barring acute status change.   VS: There were no vitals taken for this visit.  PROVIDERS: [name], MD is PCP   Benjiman Core, MD is Endocrinologist last seen 04/24/2018  Candee Furbish, MD is Cardiologist  LABS: Labs reviewed: Acceptable for surgery. (all labs ordered are listed, but only abnormal results are displayed)  Labs Reviewed  HEMOGLOBIN A1C - Abnormal; Notable for the following components:      Result Value   Hgb A1c MFr Bld 5.9 (*)    All other components within normal limits  BASIC METABOLIC PANEL - Abnormal; Notable for the following components:   Glucose, Bld 121 (*)    BUN 34 (*)     Creatinine, Ser 1.66 (*)    GFR calc non Af Amer 39 (*)    GFR calc Af Amer 45 (*)    All other components within normal limits  CBC - Abnormal; Notable for the following components:   RBC 3.84 (*)    Hemoglobin 12.0 (*)    HCT 36.7 (*)    All other components within normal limits  GLUCOSE, CAPILLARY - Abnormal; Notable for the following components:   Glucose-Capillary 108 (*)    All other components within normal limits     IMAGES:   EKG: 10/10/2018 Rate 65 bpm Sinus rhythm with 1st degree AV block  Otherwise normal ECG   CV: Echo 09/07/17  Study Conclusions  - Left ventricle: The cavity size was normal. There was moderate   concentric hypertrophy. Systolic function was normal. The   estimated ejection fraction was in the range of 60% to 65%. Wall   motion was normal; there were no regional wall motion   abnormalities. Doppler parameters are consistent with abnormal   left ventricular relaxation (grade 1 diastolic dysfunction).   Doppler parameters are consistent with elevated ventricular   end-diastolic filling pressure. - Aortic valve: Trileaflet; normal thickness leaflets. There was no   regurgitation. - Mitral valve: There was mild regurgitation. - Right ventricle: Systolic function was normal. - Right atrium: The atrium was normal in size. - Tricuspid valve: There was mild regurgitation. - Pulmonary arteries: Systolic pressure was within the normal   range. -  Inferior vena cava: The vessel was normal in size. - Pericardium, extracardiac: There was no pericardial effusion.  Impressions:  - No cardiac source of emboli was indentified. Past Medical History:  Diagnosis Date  . Cataract    in both eyes    had cataracts surgically removed  . Complication of anesthesia    reports during prostate bx year ago , his BP "dropped real low" and he had to stay overnight after what was suposed to be an amulatory surgery   . CVA (cerebral infarction)   . Diabetes  mellitus   . ED (erectile dysfunction)   . Elevated PSA   . Frequency of urination   . GERD (gastroesophageal reflux disease)   . History of nephrolithiasis   . Hyperlipidemia   . Hypertension   . Lung nodule   . Psoriasis   . PSVT (paroxysmal supraventricular tachycardia) ([place])   . Stroke ([place])    2015  . Stroke ([place]) 08/2017   reports no lasting deficits since stroke, endorses that he feels back to regular self since before stroke     Past Surgical History:  Procedure Laterality Date  . CATARACT EXTRACTION W/ INTRAOCULAR LENS IMPLANT Right 03/30/12   Dr Kathrin Penner  . COLONOSCOPY     most recent 2019  . 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 NM MYOVIEW LTD     normal EF 56% 03/08  . PARATHYROIDECTOMY Left 02/28/2018   Procedure: LEFT INFERIOR PARATHYROIDECTOMY;  Surgeon: Armandina Gemma, MD;  Location: [facility];  Service: General;  Laterality: Left;  . POLYPECTOMY    . PROSTATE BIOPSY  01/05/2012   Procedure: BIOPSY TRANSRECTAL ULTRASONIC PROSTATE (TUBP);  Surgeon: [name], MD;  [facility];  Service: Urology;  Laterality: N/A;  . TEE WITHOUT CARDIOVERSION N/A 05/21/2014   Procedure: TRANSESOPHAGEAL ECHOCARDIOGRAM (TEE);  Surgeon: Jerline Pain, MD;  [facility];  Service: Cardiovascular;  Laterality: N/A;  . TONSILLECTOMY  1960    MEDICATIONS: . amLODipine (NORVASC) 10 MG tablet  . aspirin EC 81 MG tablet  . atorvastatin (LIPITOR) 80 MG tablet  . Cholecalciferol (VITAMIN D3) 50 MCG (2000 UT) TABS  . clopidogrel (PLAVIX) 75 MG tablet  . glipiZIDE (GLUCOTROL XL) 5 MG 24 hr tablet  . glucose blood (ONETOUCH VERIO) test strip  . hydrocortisone 2.5 % cream  . ketoconazole (NIZORAL) 2 % shampoo  . lisinopril (ZESTRIL) 40 MG tablet  . metFORMIN (GLUCOPHAGE) 1000 MG tablet  . mometasone (ELOCON) 0.1 % cream  . ONETOUCH DELICA LANCETS FINE MISC   No current facility-administered medications for this encounter.       Maia Plan [facility] Pre-Surgical Testing [phone] 10/11/18  10:46 AM

## 2018-10-11 NOTE — Anesthesia Preprocedure Evaluation (Addendum)
Anesthesia Evaluation  Patient identified by MRN, date of birth, ID band Patient awake    Reviewed: Allergy & Precautions, NPO status , Patient's Chart, lab work & pertinent test results  Airway Mallampati: II  TM Distance: >3 FB Neck ROM: Full    Dental  (+) Teeth Intact, Dental Advisory Given   Pulmonary former smoker,    breath sounds clear to auscultation       Cardiovascular hypertension,  Rhythm:Regular Rate:Normal     Neuro/Psych    GI/Hepatic   Endo/Other  diabetes  Renal/GU      Musculoskeletal   Abdominal   Peds  Hematology   Anesthesia Other Findings   Reproductive/Obstetrics                            Anesthesia Physical Anesthesia Plan  ASA: III  Anesthesia Plan: MAC   Post-op Pain Management:    Induction: Intravenous  PONV Risk Score and Plan: Ondansetron and Propofol infusion  Airway Management Planned: Natural Airway and Simple Face Mask  Additional Equipment:   Intra-op Plan:   Post-operative Plan:   Informed Consent: I have reviewed the patients History and Physical, chart, labs and discussed the procedure including the risks, benefits and alternatives for the proposed anesthesia with the patient or authorized representative who has indicated his/her understanding and acceptance.     Dental advisory given  Plan Discussed with: CRNA and Anesthesiologist  Anesthesia Plan Comments: (See PAT note 10/10/2018, Konrad Felix, PA-C)       Anesthesia Quick Evaluation

## 2018-10-14 ENCOUNTER — Encounter (HOSPITAL_COMMUNITY)
Admission: RE | Disposition: A | Discharge status: Home / Self Care | Payer: Self-pay | Source: Home / Self Care | Attending: Urology

## 2018-10-14 ENCOUNTER — Observation Stay (HOSPITAL_COMMUNITY)
Admission: RE | Admit: 2018-10-14 | Discharge: 2018-10-15 | Disposition: A | Discharge status: Home / Self Care | Payer: Medicare HMO | Attending: Urology | Admitting: Urology

## 2018-10-14 ENCOUNTER — Ambulatory Visit (HOSPITAL_COMMUNITY): Payer: Medicare HMO | Admitting: Certified Registered Nurse Anesthetist

## 2018-10-14 ENCOUNTER — Other Ambulatory Visit: Payer: Self-pay

## 2018-10-14 ENCOUNTER — Encounter (HOSPITAL_COMMUNITY): Payer: Self-pay | Admitting: Emergency Medicine

## 2018-10-14 ENCOUNTER — Ambulatory Visit (HOSPITAL_COMMUNITY): Payer: Medicare HMO | Admitting: Physician Assistant

## 2018-10-14 ENCOUNTER — Ambulatory Visit (HOSPITAL_COMMUNITY): Payer: Medicare HMO

## 2018-10-14 DIAGNOSIS — D075 Carcinoma in situ of prostate: Secondary | ICD-10-CM | POA: Diagnosis not present

## 2018-10-14 DIAGNOSIS — I10 Essential (primary) hypertension: Secondary | ICD-10-CM | POA: Diagnosis not present

## 2018-10-14 DIAGNOSIS — E1122 Type 2 diabetes mellitus with diabetic chronic kidney disease: Secondary | ICD-10-CM | POA: Diagnosis not present

## 2018-10-14 DIAGNOSIS — N529 Male erectile dysfunction, unspecified: Secondary | ICD-10-CM | POA: Diagnosis not present

## 2018-10-14 DIAGNOSIS — Z87442 Personal history of urinary calculi: Secondary | ICD-10-CM | POA: Insufficient documentation

## 2018-10-14 DIAGNOSIS — Z87891 Personal history of nicotine dependence: Secondary | ICD-10-CM | POA: Diagnosis not present

## 2018-10-14 DIAGNOSIS — E119 Type 2 diabetes mellitus without complications: Secondary | ICD-10-CM | POA: Diagnosis not present

## 2018-10-14 DIAGNOSIS — Z7984 Long term (current) use of oral hypoglycemic drugs: Secondary | ICD-10-CM | POA: Diagnosis not present

## 2018-10-14 DIAGNOSIS — N401 Enlarged prostate with lower urinary tract symptoms: Secondary | ICD-10-CM | POA: Insufficient documentation

## 2018-10-14 DIAGNOSIS — Z79899 Other long term (current) drug therapy: Secondary | ICD-10-CM | POA: Diagnosis not present

## 2018-10-14 DIAGNOSIS — Z9889 Other specified postprocedural states: Secondary | ICD-10-CM

## 2018-10-14 DIAGNOSIS — Z7982 Long term (current) use of aspirin: Secondary | ICD-10-CM | POA: Diagnosis not present

## 2018-10-14 DIAGNOSIS — Z8673 Personal history of transient ischemic attack (TIA), and cerebral infarction without residual deficits: Secondary | ICD-10-CM | POA: Diagnosis not present

## 2018-10-14 DIAGNOSIS — Z7902 Long term (current) use of antithrombotics/antiplatelets: Secondary | ICD-10-CM | POA: Diagnosis not present

## 2018-10-14 DIAGNOSIS — N183 Chronic kidney disease, stage 3 (moderate): Secondary | ICD-10-CM | POA: Diagnosis not present

## 2018-10-14 DIAGNOSIS — N4231 Prostatic intraepithelial neoplasia: Secondary | ICD-10-CM | POA: Insufficient documentation

## 2018-10-14 DIAGNOSIS — R972 Elevated prostate specific antigen [PSA]: Principal | ICD-10-CM | POA: Diagnosis present

## 2018-10-14 HISTORY — PX: PROSTATE BIOPSY: SHX241

## 2018-10-14 LAB — GLUCOSE, CAPILLARY
Glucose-Capillary: 117 mg/dL — ABNORMAL HIGH (ref 70–99)
Glucose-Capillary: 111 mg/dL — ABNORMAL HIGH (ref 70–99)
Glucose-Capillary: 162 mg/dL — ABNORMAL HIGH (ref 70–99)

## 2018-10-14 SURGERY — BIOPSY, PROSTATE, RECTAL APPROACH, WITH US GUIDANCE
Anesthesia: Monitor Anesthesia Care

## 2018-10-14 MED ORDER — METFORMIN HCL 500 MG PO TABS
1000.0000 mg | ORAL_TABLET | Freq: Two times a day (BID) | ORAL | Status: DC
  Filled 2018-10-14: qty 2

## 2018-10-14 MED ORDER — ASPIRIN EC 81 MG PO TBEC: 81.0000 mg | DELAYED_RELEASE_TABLET | Freq: Every day | ORAL | Status: DC

## 2018-10-14 MED ORDER — LIDOCAINE HCL 2 % IJ SOLN
INTRAMUSCULAR | Status: AC
  Filled 2018-10-14: qty 20

## 2018-10-14 MED ORDER — GLIPIZIDE ER 5 MG PO TB24
5.0000 mg | ORAL_TABLET | Freq: Every day | ORAL | Status: DC
  Filled 2018-10-14: qty 1

## 2018-10-14 MED ORDER — ONDANSETRON HCL 4 MG/2ML IJ SOLN: 4.0000 mg | Freq: Once | INTRAMUSCULAR | Status: DC | PRN

## 2018-10-14 MED ORDER — ATORVASTATIN CALCIUM 40 MG PO TABS: 80.0000 mg | ORAL_TABLET | Freq: Every day | ORAL | Status: DC

## 2018-10-14 MED ORDER — ONDANSETRON HCL 4 MG/2ML IJ SOLN
INTRAMUSCULAR | Status: AC
  Filled 2018-10-14: qty 2

## 2018-10-14 MED ORDER — LIDOCAINE HCL 2 % IJ SOLN
INTRAMUSCULAR | Status: DC | PRN
  Administered 2018-10-14: 10 mL

## 2018-10-14 MED ORDER — LACTATED RINGERS IV SOLN
INTRAVENOUS | Status: DC
  Administered 2018-10-14: 21:00:00 via INTRAVENOUS

## 2018-10-14 MED ORDER — OXYCODONE HCL 5 MG/5ML PO SOLN: 5.0000 mg | Freq: Once | ORAL | Status: DC | PRN

## 2018-10-14 MED ORDER — SODIUM CHLORIDE 0.9 % IV SOLN
INTRAVENOUS | Status: DC | PRN
  Administered 2018-10-14: 50 ug/min via INTRAVENOUS

## 2018-10-14 MED ORDER — SODIUM CHLORIDE 0.9 % IV SOLN
2.0000 g | INTRAVENOUS | Status: AC
  Administered 2018-10-14: 2 g via INTRAVENOUS
  Filled 2018-10-14: qty 20

## 2018-10-14 MED ORDER — PROPOFOL 500 MG/50ML IV EMUL
INTRAVENOUS | Status: DC | PRN
  Administered 2018-10-14: 10 mg via INTRAVENOUS
  Administered 2018-10-14: 20 mg via INTRAVENOUS

## 2018-10-14 MED ORDER — FENTANYL CITRATE (PF) 100 MCG/2ML IJ SOLN
INTRAMUSCULAR | Status: AC
  Filled 2018-10-14: qty 2

## 2018-10-14 MED ORDER — PROPOFOL 10 MG/ML IV BOLUS
INTRAVENOUS | Status: AC
  Filled 2018-10-14: qty 20

## 2018-10-14 MED ORDER — PROPOFOL 500 MG/50ML IV EMUL
INTRAVENOUS | Status: DC | PRN
  Administered 2018-10-14: 125 ug/kg/min via INTRAVENOUS

## 2018-10-14 MED ORDER — VITAMIN D3 25 MCG (1000 UNIT) PO TABS
4000.0000 [IU] | ORAL_TABLET | Freq: Every day | ORAL | Status: DC
  Administered 2018-10-14: 4000 [IU] via ORAL
  Filled 2018-10-14: qty 4

## 2018-10-14 MED ORDER — DEXAMETHASONE SODIUM PHOSPHATE 10 MG/ML IJ SOLN
INTRAMUSCULAR | Status: AC
  Filled 2018-10-14: qty 1

## 2018-10-14 MED ORDER — OXYCODONE HCL 5 MG PO TABS: 5.0000 mg | ORAL_TABLET | Freq: Once | ORAL | Status: DC | PRN

## 2018-10-14 MED ORDER — PROPOFOL 10 MG/ML IV BOLUS
INTRAVENOUS | Status: AC
  Filled 2018-10-14: qty 40

## 2018-10-14 MED ORDER — INSULIN ASPART 100 UNIT/ML ~~LOC~~ SOLN
0.0000 [IU] | SUBCUTANEOUS | Status: DC
  Administered 2018-10-15 (×2): 3 [IU] via SUBCUTANEOUS

## 2018-10-14 MED ORDER — LACTATED RINGERS IV SOLN
INTRAVENOUS | Status: DC
  Administered 2018-10-14 (×3): via INTRAVENOUS

## 2018-10-14 MED ORDER — EPHEDRINE SULFATE 50 MG/ML IJ SOLN
INTRAMUSCULAR | Status: DC | PRN
  Administered 2018-10-14: 20 mg via INTRAVENOUS
  Administered 2018-10-14: 10 mg via INTRAVENOUS

## 2018-10-14 MED ORDER — FENTANYL CITRATE (PF) 100 MCG/2ML IJ SOLN: 25.0000 ug | INTRAMUSCULAR | Status: DC | PRN

## 2018-10-14 SURGICAL SUPPLY — 8 items
COVER SURGICAL LIGHT HANDLE (MISCELLANEOUS) ×1 IMPLANT
INST BIOPSY MAXCORE 18GX25 (NEEDLE) ×1 IMPLANT
INSTR BIOPSY MAXCORE 18GX20 (NEEDLE) IMPLANT
KIT TURNOVER KIT A (KITS) ×1 IMPLANT
NDL SPNL 22GX7 QUINCKE BK (NEEDLE) ×1 IMPLANT
NEEDLE SPNL 22GX7 QUINCKE BK (NEEDLE) IMPLANT
SYR CONTROL 10ML LL (SYRINGE) IMPLANT
UNDERPAD 30X30 (UNDERPADS AND DIAPERS) ×3 IMPLANT

## 2018-10-14 NOTE — Transfer of Care (Signed)
Immediate Anesthesia Transfer of Care Note  Patient: Tony Jackson.  Procedure(s) Performed: BIOPSY TRANSRECTAL ULTRASONIC PROSTATE (TUBP) (N/A )  Patient Location: PACU  Anesthesia Type:MAC  Level of Consciousness: awake, alert  and oriented  Airway & Oxygen Therapy: Patient Spontanous Breathing and Patient connected to face mask oxygen  Post-op Assessment: Report given to RN and Post -op Vital signs reviewed and stable  Post vital signs: Reviewed and stable  Last Vitals:  Vitals Value Taken Time  BP 118/84 10/14/18 1341  Temp    Pulse 74 10/14/18 1345  Resp 26 10/14/18 1345  SpO2 100 % 10/14/18 1345  Vitals shown include unvalidated device data.  Last Pain:  Vitals:   10/14/18 1205  TempSrc: Oral  PainSc:       Patients Stated Pain Goal: 4 (26/94/85 4627)  Complications: No apparent anesthesia complications

## 2018-10-14 NOTE — Progress Notes (Addendum)
1600: Checked on patient to obtain vital signs and attempt voiding. Pt appeared diaphoretic and stated he "felt bad". Blood pressure 86/59, HR 63, O2 96% on room air. Pt alert and oriented.  Elevated legs of recliner, reclined pt, increased IVF rate. BP continued to drop, paged Dr. Alinda Money at [phone]. Dr. Alinda Money at bedside at 1615, no new orders. We will continue to administer IVF and monitor pt's vital signs.  Dr. Alinda Money will return to check on pt and make a decision about discharge to home vs overnight observation. [place] Breeze, RN 10/14/2018  1620: pt's oxygen level dropped to 89, for several minutes even with deep breathing. Administered nasal cannula at 2L/min. Oxygen level up to 97%. Will continue to monitor.  [place] Breeze, RN 10/14/2018  1640: Dr. Alinda Money at bedside, spoke with pt and wife. Decision was made to admit pt for observation. Vital signs stable at this time.  [place] Breeze, RN 10/14/2018  1800: Pt transported to 1408. Report given to [place]. Wife at bedside.  [place] Breeze, RN 10/14/2018

## 2018-10-14 NOTE — Anesthesia Postprocedure Evaluation (Signed)
Anesthesia Post Note  Patient: Tony Jackson.  Procedure(s) Performed: BIOPSY TRANSRECTAL ULTRASONIC PROSTATE (TUBP) (N/A )     Patient location during evaluation: PACU Anesthesia Type: MAC Level of consciousness: awake and alert Pain management: pain level controlled Vital Signs Assessment: post-procedure vital signs reviewed and stable Respiratory status: spontaneous breathing, nonlabored ventilation, respiratory function stable and patient connected to nasal cannula oxygen Cardiovascular status: stable and blood pressure returned to baseline Postop Assessment: no apparent nausea or vomiting Anesthetic complications: no    Last Vitals:  Vitals:   10/14/18 1800 10/14/18 1825  BP: 115/87 131/84  Pulse: 68 61  Resp:    Temp:  36.6 C  SpO2: 100% 100%    Last Pain:  Vitals:   10/14/18 1825  TempSrc: Oral  PainSc: 7                  Janilah Hojnacki COKER

## 2018-10-14 NOTE — Op Note (Signed)
  Preoperative diagnosis:  1.Elevated PSA  Postoperative diagnosis: 1.Elevated PSA  Procedure(s): 1.Transrectal ultrasound-guided prostate needle biopsy (cognitive fusion) 2. Transrectal ultrasound of the prostate  Surgeon: Dr. Roxy Horseman, Jr  Anesthesia:Sedation  Complications:None  [id number]  Specimens: 1. Right lateral base 2. Right base 3. Right lateral mid 4. Right mid 5. Right lateral apex 6. Right apex 7. Left lateral base 8. Left base 9. Left lateral mid 10. Left mid 11. Left lateral apex 12. Left apex 13. R lateral mid MR target x 2 14. R central mid MR target x 2  Disposition of specimens:Pathology  Indication:Mr. Hollenberg is a 78 year old gentleman with an elevated PSA and abnormal MRI of the prostate. He presents today for a surveillance protocol biopsy. He was unable to tolerate a prior attempts at a biopsy in the office due to vasovagal responses. We have reviewed the potential risks and complications of a prostate needle biopsy in detail. He gives informed consent to proceed.  Description of procedure:  The patient was taken to the operating room and IV sedation was administered. He was placed in the left lateral decubitus position and administered preoperative ceftriaxone. Preoperative timeout was performed. The transrectal ultrasound probe was then placed into the rectum and the prostate was visualized. The prostate measured 159 cc. There was no evidence of any abnormalities that raise concern for obvious malignancy on ultrasound. The prostate was homogeneous.  A 12 core biopsy was obtained in a standard sextant fashion on the lateral and parasagittal regions of the base, mid, and apex areas of the right and left prostate gland. An additional 2 biopsies were obtained with cognitive fusion at the right lateral mid (PI-RADS 4) and right central mid (PI-RADS 3).  All biopsies were taken under direct ultrasound  guidance.  All of the biopsy specimens were placed in formalin and sent for permanent pathologic analysis. Following the procedure, a rectal exam was performed and demonstrated no evidence of significant bleeding. The patient was able to be safely transferred to the recovery unit in satisfactory condition. No complications were noted.

## 2018-10-14 NOTE — Progress Notes (Signed)
Patient ID: Tony Jackson., male   DOB: 1941/02/08, 78 y.o.   MRN: [id number]  Pt evaluated postoperatively.  He has a long standing history of multiple episodes of unexplained hypotension for prolonged periods of time after any invasive procedure.  He has had multiple vasovagal events in the office setting with procedures and required hospital admission after a prostate biopsy under IV sedation many years ago for similar circumstances.   Currently BP normal but low normal and fluctuating.  After discussion with patient and wife, will plan to admit for observation and IV fluid hydration overnight.  Patient has not yet voided either despite over 2 L IV fluids.  Will check bladder scan and catheterize if needed overnight.

## 2018-10-14 NOTE — Discharge Instructions (Signed)
Call if fever > 101 or excessive bleeding.

## 2018-10-15 ENCOUNTER — Encounter (HOSPITAL_COMMUNITY): Payer: Self-pay | Admitting: Urology

## 2018-10-15 DIAGNOSIS — R972 Elevated prostate specific antigen [PSA]: Secondary | ICD-10-CM | POA: Diagnosis not present

## 2018-10-15 LAB — GLUCOSE, CAPILLARY
Glucose-Capillary: 111 mg/dL — ABNORMAL HIGH (ref 70–99)
Glucose-Capillary: 185 mg/dL — ABNORMAL HIGH (ref 70–99)
Glucose-Capillary: 196 mg/dL — ABNORMAL HIGH (ref 70–99)

## 2018-10-15 LAB — CREATININE, SERUM
Creatinine, Ser: 1.7 mg/dL — ABNORMAL HIGH (ref 0.61–1.24)
GFR calc Af Amer: 44 mL/min — ABNORMAL LOW (ref >60)
GFR calc non Af Amer: 38 mL/min — ABNORMAL LOW (ref >60)

## 2018-10-15 NOTE — Discharge Summary (Signed)
  Date of admission: 10/14/2018  Date of discharge: 10/15/2018  Admission diagnosis: Elevated PSA, hypotension  Discharge diagnosis: Same  Secondary diagnoses: Diabetes, h/o CVA  History and Physical: For full details, please see admission history and physical. Briefly, Tony Jackson. is a 78 y.o. year old patient with a persistently rising PSA and abnormal MRI.  He has been unable to tolerated biopsy procedures in the office due to vasovagal responses.  He also has previously had prolonged hypotension even in the hospital after these procedures under sedation.  Hospital Course: He underwent TRUS biopsy of the prostate on 10/14/18 in the OR under IV sedation due to the above past issues.  He tolerated the procedure well but was noted to be mildly hypotensive during the procedure as he has shown previously.  This was treated with IVF and phenylephrine.  He was monitored postoperatively and his BP was borderline.  It was decided to admit him for observation considering his past issues and his borderline BP.  His BP remained stable overnight and he was otherwise stable hemodynamically.  He was able to ambulate the following morning without symptoms and orthostatic measurements were acceptable and he was discharged home.  Laboratory values: No results for input(s): HGB, HCT in the last 72 hours. Recent Labs    10/15/18 0457  CREATININE 1.70*    Disposition: Home  Discharge instruction: The patient was instructed to be ambulatory but told to refrain from heavy lifting, strenuous activity, or driving.   Discharge medications:  He will continue his regular medications including ASA 81 mg.  He will restart Plavix in 48 hrs.   Followup:  Follow-up Information    Raynelle Bring, MD.   Specialty: Urology Why: Will call with biopsy results Contact information: [place] [address] [phone]

## 2018-10-15 NOTE — Plan of Care (Signed)
  Problem: Education: Goal: Required Educational Video(s) Outcome: Adequate for Discharge   Problem: Clinical Measurements: Goal: Ability to maintain clinical measurements within normal limits will improve Outcome: Adequate for Discharge Goal: Postoperative complications will be avoided or minimized Outcome: Adequate for Discharge   Problem: Skin Integrity: Goal: Demonstration of wound healing without infection will improve Outcome: Adequate for Discharge   Problem: Education: Goal: Knowledge of General Education information will improve Description: Including pain rating scale, medication(s)/side effects and non-pharmacologic comfort measures Outcome: Adequate for Discharge   Problem: Health Behavior/Discharge Planning: Goal: Ability to manage health-related needs will improve Outcome: Adequate for Discharge   Problem: Clinical Measurements: Goal: Ability to maintain clinical measurements within normal limits will improve Outcome: Adequate for Discharge Goal: Will remain free from infection Outcome: Adequate for Discharge Goal: Diagnostic test results will improve Outcome: Adequate for Discharge Goal: Respiratory complications will improve Outcome: Adequate for Discharge Goal: Cardiovascular complication will be avoided Outcome: Adequate for Discharge   Problem: Activity: Goal: Risk for activity intolerance will decrease Outcome: Adequate for Discharge   Problem: Nutrition: Goal: Adequate nutrition will be maintained Outcome: Adequate for Discharge   Problem: Coping: Goal: Level of anxiety will decrease Outcome: Adequate for Discharge   Problem: Elimination: Goal: Will not experience complications related to bowel motility Outcome: Adequate for Discharge Goal: Will not experience complications related to urinary retention Outcome: Adequate for Discharge   Problem: Pain Managment: Goal: General experience of comfort will improve Outcome: Adequate for Discharge   Problem: Safety: Goal: Ability to remain free from injury will improve Outcome: Adequate for Discharge   Problem: Skin Integrity: Goal: Risk for impaired skin integrity will decrease Outcome: Adequate for Discharge   Problem: Clinical Measurements: Goal: Ability to maintain clinical measurements within normal limits will improve Outcome: Adequate for Discharge   Problem: Clinical Measurements: Goal: Postoperative complications will be avoided or minimized Outcome: Adequate for Discharge   Problem: Skin Integrity: Goal: Demonstration of wound healing without infection will improve Outcome: Adequate for Discharge   Problem: Education: Goal: Knowledge of General Education information will improve Description: Including pain rating scale, medication(s)/side effects and non-pharmacologic comfort measures Outcome: Adequate for Discharge   Problem: Health Behavior/Discharge Planning: Goal: Ability to manage health-related needs will improve Outcome: Adequate for Discharge   Problem: Nutrition: Goal: Adequate nutrition will be maintained Outcome: Adequate for Discharge   Problem: Coping: Goal: Level of anxiety will decrease Outcome: Adequate for Discharge   Problem: Elimination: Goal: Will not experience complications related to bowel motility Outcome: Adequate for Discharge   Problem: Elimination: Goal: Will not experience complications related to urinary retention Outcome: Adequate for Discharge   Problem: Pain Managment: Goal: General experience of comfort will improve Outcome: Adequate for Discharge   Problem: Safety: Goal: Ability to remain free from injury will improve Outcome: Adequate for Discharge   Problem: Skin Integrity: Goal: Risk for impaired skin integrity will decrease Outcome: Adequate for Discharge

## 2018-10-15 NOTE — Progress Notes (Signed)
Patient ID: Tony Jackson., male   DOB: 11/18/1940, 78 y.o.   MRN: [id number]  1 Day Post-Op Subjective: Pt doing well.  Normotensive overnight since admission.  Denies dizziness when ambulating to void.  Has voided multiple times and clear last time.  Objective: Vital signs in last 24 hours: Temp:  [97.7 F (36.5 C)-99.4 F (37.4 C)] 98 F (36.7 C) (08/25 0413) Pulse Rate:  [56-77] 76 (08/25 0413) Resp:  [14-26] 16 (08/25 0413) BP: (70-159)/(50-87) 139/75 (08/25 0413) SpO2:  [89 %-100 %] 99 % (08/25 0413) Weight:  [73.5 kg] 73.5 kg (08/24 1152)  Intake/Output from previous day: 08/24 0701 - 08/25 0700 In: 3701.5 [P.O.:600; I.V.:3001.5; IV Piggyback:100] Out: 725 [Urine:725] Intake/Output this shift: Total I/O In: -  Out: 375 [Urine:375]  Physical Exam:  General: Alert and oriented    Studies/Results:  Assessment/Plan: - Ambulate and check orthostatics.  If doing well, d/c home.  Will call with biopsy results.   LOS: 0 days   Tony Jackson 10/15/2018, 7:24 AM

## 2018-11-20 ENCOUNTER — Encounter: Payer: Self-pay | Admitting: Internal Medicine

## 2018-11-20 ENCOUNTER — Ambulatory Visit (INDEPENDENT_AMBULATORY_CARE_PROVIDER_SITE_OTHER): Payer: Medicare HMO | Admitting: Internal Medicine

## 2018-11-20 ENCOUNTER — Other Ambulatory Visit: Payer: Self-pay

## 2018-11-20 DIAGNOSIS — B0229 Other postherpetic nervous system involvement: Secondary | ICD-10-CM | POA: Insufficient documentation

## 2018-11-20 DIAGNOSIS — M5410 Radiculopathy, site unspecified: Secondary | ICD-10-CM

## 2018-11-20 MED ORDER — TIZANIDINE HCL 2 MG PO TABS: 2.0000 mg | ORAL_TABLET | Freq: Every evening | ORAL | 0 refills | Status: DC | PRN

## 2018-11-20 NOTE — Progress Notes (Signed)
Subjective:    Patient ID: Tony Jackson., male    DOB: 18-Jun-1940, 78 y.o.   MRN: [id number]  HPI Here due to pain behind his left knee Started last week--uncomfortable trying to sleep at first Has worsened since then Tried heating pad---sleeping in chair Sharp pains also--shooting  Constant in upper posterior calf  Some pain behind left hip and down leg Has had this before Thinks a muscle relaxer helped in past  No known injury--did lift something heavy (last week) Also jumped down from his [place]--short distance (last week)  Feels his leg is weak now  Current Outpatient Medications on File Prior to Visit  Medication Sig Dispense Refill  . amLODipine (NORVASC) 10 MG tablet TAKE 1 TABLET BY MOUTH EVERY DAY (Patient taking differently: Take 10 mg by mouth daily. ) 90 tablet 3  . aspirin EC 81 MG tablet Take 81 mg by mouth daily.    Marland Kitchen atorvastatin (LIPITOR) 80 MG tablet TAKE 1 TABLET BY MOUTH EVERY EVENING AT 6 PM (Patient taking differently: Take 80 mg by mouth daily at 6 PM. ) 90 tablet 3  . Cholecalciferol (VITAMIN D3) 50 MCG (2000 UT) TABS Take 4,000 Units by mouth daily.    . clopidogrel (PLAVIX) 75 MG tablet Take 1 tablet (75 mg total) by mouth daily. 30 tablet 11  . glipiZIDE (GLUCOTROL XL) 5 MG 24 hr tablet TAKE 1 TABLET BY MOUTH EVERY DAY WITH BREAKFAST (Patient taking differently: Take 5 mg by mouth daily with breakfast. ) 90 tablet 3  . glucose blood (ONETOUCH VERIO) test strip Use 1 strip daily to check blood sugar Dx Code E11.21 100 each 3  . hydrocortisone 2.5 % cream Apply topically 3 (three) times daily as needed. (Patient taking differently: Apply 1 application topically 3 (three) times daily as needed (rash). ) 28 g 3  . ketoconazole (NIZORAL) 2 % shampoo Apply 2-3 times a week--leave no for 5 minutes (Patient taking differently: Apply 1 application topically once a week. ) 120 mL 5  . lisinopril (ZESTRIL) 40 MG tablet TAKE 1 TABLET BY MOUTH EVERY DAY (Patient  taking differently: Take 40 mg by mouth daily. ) 90 tablet 1  . metFORMIN (GLUCOPHAGE) 1000 MG tablet TAKE 1 TABLET BY MOUTH TWICE A DAY WITH A MEAL (Patient taking differently: Take 1,000 mg by mouth 2 (two) times daily. ) 180 tablet 3  . mometasone (ELOCON) 0.1 % cream APPLY 1 APPLICATION TOPICALLY 2 (TWO) TIMES DAILY AS NEEDED (SKIN). (Patient taking differently: Apply 1 application topically 2 (two) times daily as needed (psoriasis). ) 45 g 1  . ONETOUCH DELICA LANCETS FINE MISC Use 1 daily to obtain blood sample Dx Code E11.21 100 each 3   No current facility-administered medications on file prior to visit.     Allergies  Allergen Reactions  . Poison Ivy Extract Rash    Past Medical History:  Diagnosis Date  . Cataract    in both eyes    had cataracts surgically removed  . Complication of anesthesia    reports during prostate bx year ago , his BP "dropped real low" and he had to stay overnight after what was suposed to be an amulatory surgery   . CVA (cerebral infarction)   . Diabetes mellitus   . ED (erectile dysfunction)   . Elevated PSA   . Frequency of urination   . GERD (gastroesophageal reflux disease)   . History of nephrolithiasis   . Hyperlipidemia   . Hypertension   .  Lung nodule   . Psoriasis   . PSVT (paroxysmal supraventricular tachycardia) ([place])   . Stroke ([place])    2015  . Stroke ([place]) 08/2017   reports no lasting deficits since stroke, endorses that he feels back to regular self since before stroke     Past Surgical History:  Procedure Laterality Date  . CATARACT EXTRACTION W/ INTRAOCULAR LENS IMPLANT Right 03/30/12   Dr Kathrin Penner  . COLONOSCOPY     most recent 2019  . 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 NM MYOVIEW LTD     normal EF 56% 03/08  . PARATHYROIDECTOMY Left 02/28/2018   Procedure: LEFT INFERIOR PARATHYROIDECTOMY;  Surgeon: Armandina Gemma, MD;  Location: [facility];  Service: General;  Laterality: Left;  .  POLYPECTOMY    . PROSTATE BIOPSY  01/05/2012   Procedure: BIOPSY TRANSRECTAL ULTRASONIC PROSTATE (TUBP);  Surgeon: [name], MD;  Location[facility];  Service: Urology;  Laterality: N/A;  . PROSTATE BIOPSY N/A 10/14/2018   Procedure: BIOPSY TRANSRECTAL ULTRASONIC PROSTATE (TUBP);  Surgeon: Raynelle Bring, MD;  Location: [facility];  Service: Urology;  Laterality: N/A;  . TEE WITHOUT CARDIOVERSION N/A 05/21/2014   Procedure: TRANSESOPHAGEAL ECHOCARDIOGRAM (TEE);  Surgeon: Jerline Pain, MD;  Location: [facility];  Service: Cardiovascular;  Laterality: N/A;  . TONSILLECTOMY  1960    Family History  Problem Relation Age of Onset  . Cancer Mother        breast cancer  . Diabetes Mother   . Colon polyps Brother   . Heart disease Neg Hx   . Colon cancer Neg Hx   . Esophageal cancer Neg Hx   . Rectal cancer Neg Hx   . Stomach cancer Neg Hx     Social History   Socioeconomic History  . Marital status: Married    Spouse name: Peter Congo  . Number of children: 2  . Years of education: HS  . Highest education level: Not on file  Occupational History  . 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 Financial resource strain: Not on file  . Food insecurity    Worry: Not on file    Inability: Not on file  . Transportation needs    Medical: Not on file    Non-medical: Not on file  Tobacco Use  . Smoking status: Former Smoker    Packs/day: 1.00    Years: 5.00    Pack years: 5.00    Types: Cigarettes  . Smokeless tobacco: Current User    Types: Chew  . Tobacco comment: QUIT SMOKING CIGARETTES 50 YRS AGO--  CHEWED TOBACCO FOR 40 YRS  Substance and Sexual Activity  . Alcohol use: No    Alcohol/week: 0.0 standard drinks  . Drug use: No  . Sexual activity: Not Currently  Lifestyle  . Physical activity    Days per week: Not on file    Minutes per session: Not on file  . Stress: Not on file  Relationships  . Social Herbalist on  phone: Not on file    Gets together: Not on file    Attends religious service: Not on file    Active member of club or organization: Not on file    Attends meetings of clubs or organizations: Not on file    Relationship status: Not on file  . Intimate partner violence    Fear of current or ex partner: Not on  file    Emotionally abused: Not on file    Physically abused: Not on file    Forced sexual activity: Not on file  Other Topics Concern  . Not on file  Social History Narrative   Has living will   Requests wife as health care POA. Children would be alternate   Would accept resuscitation attempts   Not sure about tube feeds      Lives at home with his wife   Right handed    Caffeine: occasional    Review of Systems Had prostate biopsy last month---was negative No change in bowel or bladder function    Objective:   Physical Exam  Musculoskeletal:     Comments: No spine tenderness Mild left lumbar tenderness Decreased internal rotation of both hips--but doesn't cause pain SLR negative  No edema in legs. No mass or tenderness in popliteal fossa or entire leg  Neurological:  Normal gait No weakness in legs           Assessment & Plan:

## 2018-11-20 NOTE — Assessment & Plan Note (Signed)
Likely just sciatica Could be mild ruptured disc--but no weakness or severe symptoms May have tried tylenol --not much help Mostly troubling at night Will try tizanidine at bedtime

## 2018-11-21 DIAGNOSIS — M79605 Pain in left leg: Secondary | ICD-10-CM | POA: Diagnosis not present

## 2018-11-25 DIAGNOSIS — R69 Illness, unspecified: Secondary | ICD-10-CM | POA: Diagnosis not present

## 2018-11-26 ENCOUNTER — Telehealth: Payer: Self-pay | Admitting: Internal Medicine

## 2018-11-26 NOTE — Telephone Encounter (Signed)
Patient was seen by Dr.Letvak last Wednesday for leg pain.  Patient saw Dr. Melchor Amour Thursday.  Patient's wife couldn't go in with patient and patient told her Dr.Kendall didn't do anything.  This morning patient woke up with no feeling in his left foot and still has leg pain.  Patient has been taking the muscle relaxers at night that Dr.Letvak prescribed.  Patient is able to sleep.  Patient has no appetite and no energy and ?depression.  Patient uses [facility].

## 2018-11-26 NOTE — Telephone Encounter (Signed)
Spoke to wife. No imaging done or planned to do. We decided to make him an appt with Dr Silvio Pate tomorrow morning about the lack of appetite and no energy. She is going to call ortho and let them know about his foot.

## 2018-11-27 ENCOUNTER — Other Ambulatory Visit: Payer: Self-pay | Admitting: Internal Medicine

## 2018-11-27 ENCOUNTER — Other Ambulatory Visit: Payer: Self-pay

## 2018-11-27 ENCOUNTER — Encounter: Payer: Self-pay | Admitting: Internal Medicine

## 2018-11-27 ENCOUNTER — Ambulatory Visit (INDEPENDENT_AMBULATORY_CARE_PROVIDER_SITE_OTHER): Payer: Medicare HMO | Admitting: Internal Medicine

## 2018-11-27 ENCOUNTER — Telehealth: Payer: Self-pay

## 2018-11-27 VITALS — BP 116/76 | HR 80 | Temp 98.7°F | Ht 69.0 in | Wt 157.0 lb

## 2018-11-27 DIAGNOSIS — Z23 Encounter for immunization: Secondary | ICD-10-CM

## 2018-11-27 DIAGNOSIS — B029 Zoster without complications: Secondary | ICD-10-CM | POA: Diagnosis not present

## 2018-11-27 DIAGNOSIS — B019 Varicella without complication: Secondary | ICD-10-CM | POA: Diagnosis not present

## 2018-11-27 MED ORDER — VALACYCLOVIR HCL 1 G PO TABS: 1000.0000 mg | ORAL_TABLET | Freq: Three times a day (TID) | ORAL | 1 refills | Status: DC

## 2018-11-27 NOTE — Progress Notes (Signed)
Subjective:    Patient ID: Tony Jackson., male    DOB: 03-18-1940, 78 y.o.   MRN: [id number]  HPI Here with wife---not feeling well  Went to ortho---did exam but wasn't too impressed Since then, more aching Got vesicles on left foot 2-3 days ago Happened after using heating pad  Current Outpatient Medications on File Prior to Visit  Medication Sig Dispense Refill  . amLODipine (NORVASC) 10 MG tablet TAKE 1 TABLET BY MOUTH EVERY DAY (Patient taking differently: Take 10 mg by mouth daily. ) 90 tablet 3  . aspirin EC 81 MG tablet Take 81 mg by mouth daily.    Marland Kitchen atorvastatin (LIPITOR) 80 MG tablet TAKE 1 TABLET BY MOUTH EVERY EVENING AT 6 PM (Patient taking differently: Take 80 mg by mouth daily at 6 PM. ) 90 tablet 3  . Cholecalciferol (VITAMIN D3) 50 MCG (2000 UT) TABS Take 4,000 Units by mouth daily.    . clopidogrel (PLAVIX) 75 MG tablet Take 1 tablet (75 mg total) by mouth daily. 30 tablet 11  . glipiZIDE (GLUCOTROL XL) 5 MG 24 hr tablet TAKE 1 TABLET BY MOUTH EVERY DAY WITH BREAKFAST (Patient taking differently: Take 5 mg by mouth daily with breakfast. ) 90 tablet 3  . glucose blood (ONETOUCH VERIO) test strip Use 1 strip daily to check blood sugar Dx Code E11.21 100 each 3  . hydrocortisone 2.5 % cream Apply topically 3 (three) times daily as needed. (Patient taking differently: Apply 1 application topically 3 (three) times daily as needed (rash). ) 28 g 3  . ketoconazole (NIZORAL) 2 % shampoo Apply 2-3 times a week--leave no for 5 minutes (Patient taking differently: Apply 1 application topically once a week. ) 120 mL 5  . lisinopril (ZESTRIL) 40 MG tablet TAKE 1 TABLET BY MOUTH EVERY DAY (Patient taking differently: Take 40 mg by mouth daily. ) 90 tablet 1  . metFORMIN (GLUCOPHAGE) 1000 MG tablet TAKE 1 TABLET BY MOUTH TWICE A DAY WITH A MEAL (Patient taking differently: Take 1,000 mg by mouth 2 (two) times daily. ) 180 tablet 3  . mometasone (ELOCON) 0.1 % cream APPLY 1  APPLICATION TOPICALLY 2 (TWO) TIMES DAILY AS NEEDED (SKIN). (Patient taking differently: Apply 1 application topically 2 (two) times daily as needed (psoriasis). ) 45 g 1  . ONETOUCH DELICA LANCETS FINE MISC Use 1 daily to obtain blood sample Dx Code E11.21 100 each 3  . tiZANidine (ZANAFLEX) 2 MG tablet Take 1-2 tablets (2-4 mg total) by mouth at bedtime as needed for muscle spasms. 30 tablet 0   No current facility-administered medications on file prior to visit.     Allergies  Allergen Reactions  . Poison Ivy Extract Rash    Past Medical History:  Diagnosis Date  . Cataract    in both eyes    had cataracts surgically removed  . Complication of anesthesia    reports during prostate bx year ago , his BP "dropped real low" and he had to stay overnight after what was suposed to be an amulatory surgery   . CVA (cerebral infarction)   . Diabetes mellitus   . ED (erectile dysfunction)   . Elevated PSA   . Frequency of urination   . GERD (gastroesophageal reflux disease)   . History of nephrolithiasis   . Hyperlipidemia   . Hypertension   . Lung nodule   . Psoriasis   . PSVT (paroxysmal supraventricular tachycardia) ([place])   . Stroke [facility])  2015  . Stroke ([place]) 08/2017   reports no lasting deficits since stroke, endorses that he feels back to regular self since before stroke     Past Surgical History:  Procedure Laterality Date  . CATARACT EXTRACTION W/ INTRAOCULAR LENS IMPLANT Right 03/30/12   Dr Kathrin Penner  . COLONOSCOPY     most recent 2019  . 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 NM MYOVIEW LTD     normal EF 56% 03/08  . PARATHYROIDECTOMY Left 02/28/2018   Procedure: LEFT INFERIOR PARATHYROIDECTOMY;  Surgeon: Armandina Gemma, MD;  [facility];  Service: General;  Laterality: Left;  . POLYPECTOMY    . PROSTATE BIOPSY  01/05/2012   Procedure: BIOPSY TRANSRECTAL ULTRASONIC PROSTATE (TUBP);  Surgeon: [name], MD;  Location: [place];  Service: Urology;  Laterality: N/A;  . PROSTATE BIOPSY N/A 10/14/2018   Procedure: BIOPSY TRANSRECTAL ULTRASONIC PROSTATE (TUBP);  Surgeon: Raynelle Bring, MD;  [facility];  Service: Urology;  Laterality: N/A;  . TEE WITHOUT CARDIOVERSION N/A 05/21/2014   Procedure: TRANSESOPHAGEAL ECHOCARDIOGRAM (TEE);  Surgeon: Jerline Pain, MD;  Location: [facility] ENDOSCOPY;  Service: Cardiovascular;  Laterality: N/A;  . TONSILLECTOMY  1960    Family History  Problem Relation Age of Onset  . Cancer Mother        breast cancer  . Diabetes Mother   . Colon polyps Brother   . Heart disease Neg Hx   . Colon cancer Neg Hx   . Esophageal cancer Neg Hx   . Rectal cancer Neg Hx   . Stomach cancer Neg Hx     Social History   Socioeconomic History  . Marital status: Married    Spouse name: Peter Congo  . Number of children: 2  . Years of education: HS  . Highest education level: Not on file  Occupational History  . 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 Financial resource strain: Not on file  . Food insecurity    Worry: Not on file    Inability: Not on file  . Transportation needs    Medical: Not on file    Non-medical: Not on file  Tobacco Use  . Smoking status: Former Smoker    Packs/day: 1.00    Years: 5.00    Pack years: 5.00    Types: Cigarettes  . Smokeless tobacco: Current User    Types: Chew  . Tobacco comment: QUIT SMOKING CIGARETTES 50 YRS AGO--  CHEWED TOBACCO FOR 40 YRS  Substance and Sexual Activity  . Alcohol use: No    Alcohol/week: 0.0 standard drinks  . Drug use: No  . Sexual activity: Not Currently  Lifestyle  . Physical activity    Days per week: Not on file    Minutes per session: Not on file  . Stress: Not on file  Relationships  . Social Herbalist on phone: Not on file    Gets together: Not on file    Attends religious service: Not on file    Active member of club or organization: Not on file     Attends meetings of clubs or organizations: Not on file    Relationship status: Not on file  . Intimate partner violence    Fear of current or ex partner: Not on file    Emotionally abused: Not on file    Physically abused: Not on file    Forced sexual  activity: Not on file  Other Topics Concern  . Not on file  Social History Narrative   Has living will   Requests wife as health care POA. Children would be alternate   Would accept resuscitation attempts   Not sure about tube feeds      Lives at home with his wife   Right handed    Caffeine: occasional    Review of Systems No known fever Appetite is off Sleeping a lot---not doing much    Objective:   Physical Exam  Constitutional: He appears well-developed. No distress.  Skin:  Classic clumps of vesicles in (probably) S1 distribution in left foot. Some lesions now crusting behind and below left knee laterally           Assessment & Plan:

## 2018-11-27 NOTE — Addendum Note (Signed)
Addended by: Pilar Grammes on: 11/27/2018 12:43 PM   Modules accepted: Orders

## 2018-11-27 NOTE — Telephone Encounter (Signed)
Mrs Parola [facility] signed) left v/m that pt was seen earlier today and dx with shingles; great grandchildren are supposed to come to pt home this week for baby sitting and Mrs Warmkessel request cb from [place].

## 2018-11-27 NOTE — Telephone Encounter (Signed)
Children are all immunized against chicken pox starting at around 78 year old. If they had there shots, okay to babysit them (though the lesions should be well covered)

## 2018-11-27 NOTE — Telephone Encounter (Signed)
Spoke to wife. They are 9, 3, and just turned 1.

## 2018-11-27 NOTE — Assessment & Plan Note (Signed)
This explains his radiculopathy Delay in rash but classic now Will stop the tizanidine Stop valacyclovir  He seems more anxious about what is going on as opposed to severe pain If pain is severe, will try gabapentin

## 2018-11-27 NOTE — Telephone Encounter (Signed)
Spoke to pt

## 2018-11-28 DIAGNOSIS — R531 Weakness: Secondary | ICD-10-CM | POA: Diagnosis not present

## 2018-11-28 DIAGNOSIS — L409 Psoriasis, unspecified: Secondary | ICD-10-CM | POA: Diagnosis not present

## 2018-11-28 DIAGNOSIS — R32 Unspecified urinary incontinence: Secondary | ICD-10-CM | POA: Diagnosis not present

## 2018-11-28 DIAGNOSIS — Z7902 Long term (current) use of antithrombotics/antiplatelets: Secondary | ICD-10-CM | POA: Diagnosis not present

## 2018-11-28 DIAGNOSIS — N529 Male erectile dysfunction, unspecified: Secondary | ICD-10-CM | POA: Diagnosis not present

## 2018-11-28 DIAGNOSIS — I69398 Other sequelae of cerebral infarction: Secondary | ICD-10-CM | POA: Diagnosis not present

## 2018-11-28 DIAGNOSIS — E785 Hyperlipidemia, unspecified: Secondary | ICD-10-CM | POA: Diagnosis not present

## 2018-11-28 DIAGNOSIS — I4891 Unspecified atrial fibrillation: Secondary | ICD-10-CM | POA: Diagnosis not present

## 2018-11-28 DIAGNOSIS — Z7984 Long term (current) use of oral hypoglycemic drugs: Secondary | ICD-10-CM | POA: Diagnosis not present

## 2018-11-28 DIAGNOSIS — I10 Essential (primary) hypertension: Secondary | ICD-10-CM | POA: Diagnosis not present

## 2018-11-28 DIAGNOSIS — E1159 Type 2 diabetes mellitus with other circulatory complications: Secondary | ICD-10-CM | POA: Diagnosis not present

## 2018-11-28 DIAGNOSIS — E1151 Type 2 diabetes mellitus with diabetic peripheral angiopathy without gangrene: Secondary | ICD-10-CM | POA: Diagnosis not present

## 2018-12-05 ENCOUNTER — Other Ambulatory Visit: Payer: Self-pay | Admitting: Neurology

## 2018-12-09 ENCOUNTER — Ambulatory Visit (INDEPENDENT_AMBULATORY_CARE_PROVIDER_SITE_OTHER): Payer: Medicare HMO | Admitting: Internal Medicine

## 2018-12-09 ENCOUNTER — Encounter: Payer: Self-pay | Admitting: Internal Medicine

## 2018-12-09 ENCOUNTER — Other Ambulatory Visit: Payer: Self-pay

## 2018-12-09 DIAGNOSIS — B019 Varicella without complication: Secondary | ICD-10-CM | POA: Diagnosis not present

## 2018-12-09 DIAGNOSIS — B029 Zoster without complications: Secondary | ICD-10-CM | POA: Diagnosis not present

## 2018-12-09 IMAGING — NM NM PARATHYROID W/ SPECT / CT
7 series · 22 of 22 positions shown · non-contrast
Comparison: None

CLINICAL DATA: Hyperparathyroidism, hypercalcemia

EXAM:
NM PARATHYROID SCINTIGRAPHY AND SPECT IMAGING
TECHNIQUE: Following intravenous administration of radiopharmaceutical, early
and 2-hour delayed planar images were obtained in the anterior
projection. Delayed triplanar SPECT images were also obtained at 2
hours.
RADIOPHARMACEUTICALS:  25 mCi Kc-77m Sestamibi IV

[Series 1: spect - (id)_(id)_tra · 4.1mm · 4.14mm/px · 6 of 128 frames shown]
[frame 11/128]
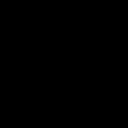
[frame 32/128]
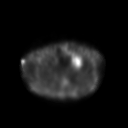
[frame 54/128]
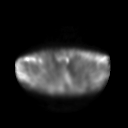
[frame 75/128]
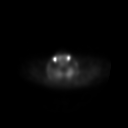
[frame 96/128]
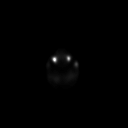
[frame 118/128]
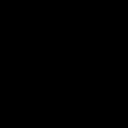

[Series 1: 15 min ant · 4.14mm/px · 1 of 1 slices shown]
[im 1/1]
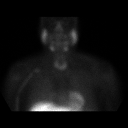

[Series 1: wbr_bone_60 15 min ant · 4.14mm/px · 1 of 1 slices shown]
[im 1/1]
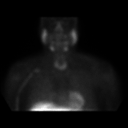

[Series 1: spect - (id)_(id)_cor · 4.1mm · 4.14mm/px · 6 of 128 frames shown]
[frame 11/128]
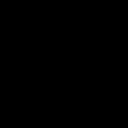
[frame 32/128]
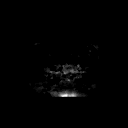
[frame 54/128]
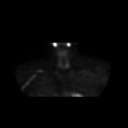
[frame 75/128]
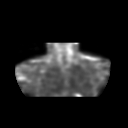
[frame 96/128]
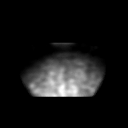
[frame 118/128]
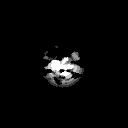

[Series 2: 2 hr ant · 4.14mm/px · 1 of 1 slices shown]
[im 1/1]
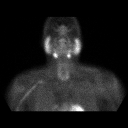

[Series 2: wbr_bone_60 2 hr ant · 4.14mm/px · 1 of 1 slices shown]
[im 1/1]
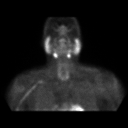

[Series 3: spect parathyroid · 4.14mm/px · 6 of 64 frames shown]
[frame 6/64  full-range]
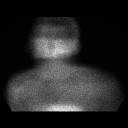
[frame 16/64  full-range]
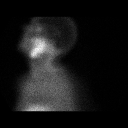
[frame 27/64  full-range]
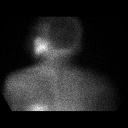
[frame 38/64  full-range]
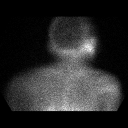
[frame 48/64  full-range]
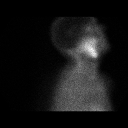
[frame 59/64  full-range]
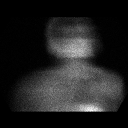

[22 of 22 positions shown; findings below may reference images not displayed]

FINDINGS: Planar imaging: Normal initial distribution of sestamibi within the
thyroid lobes. Normal washout of tracer on delayed image at 2 hours.
Questionable subtle area of abnormal sestamibi retention at the LEFT
inferior parathyroid gland. No ectopic localization of tracer within
the mediastinum.

SPECT imaging: Focus of abnormal sestamibi retention at the expected
position of the LEFT inferior parathyroid gland suspicious for a
parathyroid adenoma. No additional retention of tracer at the
expected positions of the remaining parathyroid glands. No ectopic
localization of tracer within the mediastinum.
IMPRESSION: Abnormal sestamibi retention at the expected position of the LEFT
inferior parathyroid gland suspicious for a parathyroid adenoma.

## 2018-12-09 MED ORDER — GABAPENTIN 100 MG PO CAPS: 100.0000 mg | ORAL_CAPSULE | Freq: Three times a day (TID) | ORAL | 3 refills | Status: DC

## 2018-12-09 NOTE — Assessment & Plan Note (Signed)
Lesions have crusted Asked him to stop the antiviral ---since all lesions crusted and not sure whether it is part of the anorexia Will start low dose gabapentin and titrate

## 2018-12-09 NOTE — Progress Notes (Signed)
Subjective:    Patient ID: Tony Jackson., male    DOB: [birth year], 78 y.o.   MRN: [id number]  HPI Here with wife for ongoing symptoms from the zoster  He is now on the second course of the valacyclovir The pain is still fairly bad--most of the time Sleeps okay---sleeping a lot actually Just moving between chair and bed  Appetite is poor Has lost weight Energy is bad Tylenol no help  Current Outpatient Medications on File Prior to Visit  Medication Sig Dispense Refill  . amLODipine (NORVASC) 10 MG tablet TAKE 1 TABLET BY MOUTH EVERY DAY (Patient taking differently: Take 10 mg by mouth daily. ) 90 tablet 3  . aspirin EC 81 MG tablet Take 81 mg by mouth daily.    Marland Kitchen atorvastatin (LIPITOR) 80 MG tablet TAKE 1 TABLET BY MOUTH EVERY EVENING AT 6 PM (Patient taking differently: Take 80 mg by mouth daily at 6 PM. ) 90 tablet 3  . Cholecalciferol (VITAMIN D3) 50 MCG (2000 UT) TABS Take 4,000 Units by mouth daily.    . clopidogrel (PLAVIX) 75 MG tablet Take 1 tablet (75 mg total) by mouth daily. 30 tablet 11  . glipiZIDE (GLUCOTROL XL) 5 MG 24 hr tablet TAKE 1 TABLET BY MOUTH EVERY DAY WITH BREAKFAST (Patient taking differently: Take 5 mg by mouth daily with breakfast. ) 90 tablet 3  . glucose blood (ONETOUCH VERIO) test strip Use 1 strip daily to check blood sugar Dx Code E11.21 100 each 3  . hydrocortisone 2.5 % cream Apply topically 3 (three) times daily as needed. (Patient taking differently: Apply 1 application topically 3 (three) times daily as needed (rash). ) 28 g 3  . ketoconazole (NIZORAL) 2 % shampoo Apply 2-3 times a week--leave no for 5 minutes (Patient taking differently: Apply 1 application topically once a week. ) 120 mL 5  . lisinopril (ZESTRIL) 40 MG tablet TAKE 1 TABLET BY MOUTH EVERY DAY (Patient taking differently: Take 40 mg by mouth daily. ) 90 tablet 1  . metFORMIN (GLUCOPHAGE) 1000 MG tablet TAKE 1 TABLET BY MOUTH TWICE A DAY WITH A MEAL (Patient taking differently:  Take 1,000 mg by mouth 2 (two) times daily. ) 180 tablet 3  . mometasone (ELOCON) 0.1 % cream APPLY 1 APPLICATION TOPICALLY 2 (TWO) TIMES DAILY AS NEEDED (SKIN). (Patient taking differently: Apply 1 application topically 2 (two) times daily as needed (psoriasis). ) 45 g 1  . ONETOUCH DELICA LANCETS FINE MISC Use 1 daily to obtain blood sample Dx Code E11.21 100 each 3  . valACYclovir (VALTREX) 1000 MG tablet Take 1 tablet (1,000 mg total) by mouth 3 (three) times daily. 21 tablet 1   No current facility-administered medications on file prior to visit.     Allergies  Allergen Reactions  . Poison Ivy Extract Rash    Past Medical History:  Diagnosis Date  . Cataract    in both eyes    had cataracts surgically removed  . Complication of anesthesia    reports during prostate bx year ago , his BP "dropped real low" and he had to stay overnight after what was suposed to be an amulatory surgery   . CVA (cerebral infarction)   . Diabetes mellitus   . ED (erectile dysfunction)   . Elevated PSA   . Frequency of urination   . GERD (gastroesophageal reflux disease)   . History of nephrolithiasis   . Hyperlipidemia   . Hypertension   . Lung  nodule   . Psoriasis   . PSVT (paroxysmal supraventricular tachycardia) ([place])   . Stroke ([place])    2015  . Stroke ([place]) 08/2017   reports no lasting deficits since stroke, endorses that he feels back to regular self since before stroke     Past Surgical History:  Procedure Laterality Date  . CATARACT EXTRACTION W/ INTRAOCULAR LENS IMPLANT Right 03/30/12   Dr Kathrin Penner  . COLONOSCOPY     most recent 2019  . 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 NM MYOVIEW LTD     normal EF 56% 03/08  . PARATHYROIDECTOMY Left 02/28/2018   Procedure: LEFT INFERIOR PARATHYROIDECTOMY;  Surgeon: Armandina Gemma, MD;  [facility];  Service: General;  Laterality: Left;  . POLYPECTOMY    . PROSTATE BIOPSY  01/05/2012   Procedure: BIOPSY  TRANSRECTAL ULTRASONIC PROSTATE (TUBP);  Surgeon: [name], MD;  Location: [facility];  Service: Urology;  Laterality: N/A;  . PROSTATE BIOPSY N/A 10/14/2018   Procedure: BIOPSY TRANSRECTAL ULTRASONIC PROSTATE (TUBP);  Surgeon: Raynelle Bring, MD;  [facility];  Service: Urology;  Laterality: N/A;  . TEE WITHOUT CARDIOVERSION N/A 05/21/2014   Procedure: TRANSESOPHAGEAL ECHOCARDIOGRAM (TEE);  Surgeon: Jerline Pain, MD;  Location: [facility] ENDOSCOPY;  Service: Cardiovascular;  Laterality: N/A;  . TONSILLECTOMY  1960    Family History  Problem Relation Age of Onset  . Cancer Mother        breast cancer  . Diabetes Mother   . Colon polyps Brother   . Heart disease Neg Hx   . Colon cancer Neg Hx   . Esophageal cancer Neg Hx   . Rectal cancer Neg Hx   . Stomach cancer Neg Hx     Social History   Socioeconomic History  . Marital status: Married    Spouse name: Peter Congo  . Number of children: 2  . Years of education: HS  . Highest education level: Not on file  Occupational History  . 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 Financial resource strain: Not on file  . Food insecurity    Worry: Not on file    Inability: Not on file  . Transportation needs    Medical: Not on file    Non-medical: Not on file  Tobacco Use  . Smoking status: Former Smoker    Packs/day: 1.00    Years: 5.00    Pack years: 5.00    Types: Cigarettes  . Smokeless tobacco: Current User    Types: Chew  . Tobacco comment: QUIT SMOKING CIGARETTES 50 YRS AGO--  CHEWED TOBACCO FOR 40 YRS  Substance and Sexual Activity  . Alcohol use: No    Alcohol/week: 0.0 standard drinks  . Drug use: No  . Sexual activity: Not Currently  Lifestyle  . Physical activity    Days per week: Not on file    Minutes per session: Not on file  . Stress: Not on file  Relationships  . Social Herbalist on phone: Not on file    Gets together: Not on file    Attends  religious service: Not on file    Active member of club or organization: Not on file    Attends meetings of clubs or organizations: Not on file    Relationship status: Not on file  . Intimate partner violence    Fear of current or ex partner: Not on file  Emotionally abused: Not on file    Physically abused: Not on file    Forced sexual activity: Not on file  Other Topics Concern  . Not on file  Social History Narrative   Has living will   Requests wife as health care POA. Children would be alternate   Would accept resuscitation attempts   Not sure about tube feeds      Lives at home with his wife   Right handed    Caffeine: occasional    Review of Systems  Not drinking that much either--but does have some chocolate milk (discussed using [employer] Instant Breakfast) Some nausea--but no diarrhea     Objective:   Physical Exam  Constitutional:  Sedate but no distress  GI: Soft. There is no abdominal tenderness.  Skin:  Lesions in left calf and foot have all crusted now           Assessment & Plan:

## 2018-12-09 NOTE — Patient Instructions (Signed)
Start the gabapentin at 1 capsule three times a day. If still having significant pain, and not overly tired on it after 3-4 days, increase slowly to 2 capsules three times a day. We can increase further if needed. Stop the valacyclovir

## 2018-12-16 ENCOUNTER — Telehealth: Payer: Self-pay

## 2018-12-16 NOTE — Telephone Encounter (Signed)
Patient's wife, Tony Jackson, called states patient is not eating well still. He is sleeping a lot, he is very down and fatigue. She tries to get patient to move around so he does not loose the muscle tone from not been active. Tony Jackson states yesterday patient took a shower and it wore him out completely. Patient weighed at home at 140lbs on 12/12/2018, per Tony Jackson patient was around 161lbs a month ago per their scale. Gloria's friend recommended to ask provider about medication like Remeron or Marinol if that would be appropriate for patient to try. Tony Jackson just does not know what else to do help patient. She has been giving him boost OTC to drink, anything else he should or can try to boost appetite?

## 2018-12-16 NOTE — Telephone Encounter (Signed)
Wife called back I told her he noted less pain (now on gabapentin 300/300/600) and she was some surprised Not eating well---but she will check again once she gets home. If he continues to eat poorly, she will let me know and we can consider adding bedtime mirtazapine. Daughter concerned about some GI problem as well---but no clear symptoms of that

## 2018-12-16 NOTE — Telephone Encounter (Signed)
Returned wife's call --but was unidentified voice mail. Called the home and spoke to patient--he feels his pain is some better. Wife is not home now--out of town till [place]. I told him I would try to call her again tomorrow

## 2018-12-29 ENCOUNTER — Other Ambulatory Visit: Payer: Self-pay | Admitting: Neurology

## 2018-12-31 MED ORDER — CLOPIDOGREL BISULFATE 75 MG PO TABS: 75.0000 mg | ORAL_TABLET | Freq: Every day | ORAL | 3 refills | Status: DC

## 2018-12-31 NOTE — Addendum Note (Signed)
Addended by: Pilar Grammes on: 12/31/2018 10:52 AM   Modules accepted: Orders

## 2018-12-31 NOTE — Telephone Encounter (Signed)
Okay to refill for a year He probably should still set up his neurology follow up though

## 2019-01-05 ENCOUNTER — Other Ambulatory Visit: Payer: Self-pay | Admitting: Internal Medicine

## 2019-01-24 ENCOUNTER — Other Ambulatory Visit: Payer: Self-pay

## 2019-01-24 DIAGNOSIS — Z20822 Contact with and (suspected) exposure to covid-19: Secondary | ICD-10-CM

## 2019-01-28 LAB — NOVEL CORONAVIRUS, NAA: SARS-CoV-2, NAA: NOT DETECTED

## 2019-02-01 ENCOUNTER — Other Ambulatory Visit: Payer: Self-pay | Admitting: Internal Medicine

## 2019-02-04 DIAGNOSIS — R69 Illness, unspecified: Secondary | ICD-10-CM | POA: Diagnosis not present

## 2019-02-10 ENCOUNTER — Other Ambulatory Visit: Payer: Self-pay | Admitting: Internal Medicine

## 2019-02-27 ENCOUNTER — Other Ambulatory Visit: Payer: Self-pay | Admitting: Internal Medicine

## 2019-02-27 DIAGNOSIS — R69 Illness, unspecified: Secondary | ICD-10-CM | POA: Diagnosis not present

## 2019-03-11 ENCOUNTER — Other Ambulatory Visit: Payer: Self-pay | Admitting: *Deleted

## 2019-03-11 DIAGNOSIS — Z20822 Contact with and (suspected) exposure to covid-19: Secondary | ICD-10-CM

## 2019-03-12 LAB — NOVEL CORONAVIRUS, NAA: SARS-CoV-2, NAA: NOT DETECTED

## 2019-03-14 ENCOUNTER — Ambulatory Visit: Payer: Medicare HMO | Attending: Internal Medicine

## 2019-03-14 DIAGNOSIS — Z23 Encounter for immunization: Secondary | ICD-10-CM | POA: Insufficient documentation

## 2019-03-14 NOTE — Progress Notes (Signed)
   [id number]  Name:  Vontrell Stendahl.    MRN: [id number] DOB: 02/13/1941  03/14/2019  Mr. Rabinowitz was observed post Covid-19 immunization for 15 minutes without incidence. He was provided with Vaccine Information Sheet and instruction to access the V-Safe system.   Mr. Hise was instructed to call 911 with any severe reactions post vaccine: Marland Kitchen Difficulty breathing  . Swelling of your face and throat  . A fast heartbeat  . A bad rash all over your body  . Dizziness and weakness    Immunizations Administered    Name Date Dose VIS Date Route   Pfizer COVID-19 Vaccine 03/14/2019  1:15 PM 0.3 mL 01/31/2019 Intramuscular   Manufacturer: [place]   Lot: [id number]   [place]: [id number]

## 2019-03-23 ENCOUNTER — Other Ambulatory Visit: Payer: Self-pay | Admitting: Internal Medicine

## 2019-03-24 DIAGNOSIS — R69 Illness, unspecified: Secondary | ICD-10-CM | POA: Diagnosis not present

## 2019-04-03 DIAGNOSIS — R69 Illness, unspecified: Secondary | ICD-10-CM | POA: Diagnosis not present

## 2019-04-05 ENCOUNTER — Ambulatory Visit: Payer: Medicare HMO | Attending: Internal Medicine

## 2019-04-05 DIAGNOSIS — Z23 Encounter for immunization: Secondary | ICD-10-CM

## 2019-04-05 NOTE — Progress Notes (Signed)
   [id number] [facility]  Name:  Tony Jackson.    MRN: [id number] DOB: October 15, 1940  04/05/2019  [name] was observed post Covid-19 immunization for 15 minutes without incidence. He was provided with Vaccine Information Sheet and instruction to access the V-Safe system.   [name] was instructed to call 911 with any severe reactions post vaccine: Marland Kitchen Difficulty breathing  . Swelling of your face and throat  . A fast heartbeat  . A bad rash all over your body  . Dizziness and weakness    Immunizations Administered    Name Date Dose VIS Date Route   Pfizer COVID-19 Vaccine 04/05/2019 11:20 AM 0.3 mL 01/31/2019 Intramuscular   Manufacturer: [place]   Lot: [id number]   [place]: [id number]

## 2019-04-11 DIAGNOSIS — R972 Elevated prostate specific antigen [PSA]: Secondary | ICD-10-CM | POA: Diagnosis not present

## 2019-04-14 ENCOUNTER — Other Ambulatory Visit: Payer: Self-pay

## 2019-04-14 ENCOUNTER — Encounter: Payer: Self-pay | Admitting: Family Medicine

## 2019-04-14 ENCOUNTER — Ambulatory Visit (INDEPENDENT_AMBULATORY_CARE_PROVIDER_SITE_OTHER): Payer: Medicare HMO | Admitting: Family Medicine

## 2019-04-14 VITALS — BP 142/76 | HR 68 | Temp 96.2°F | Ht 69.0 in | Wt 165.5 lb

## 2019-04-14 DIAGNOSIS — G629 Polyneuropathy, unspecified: Secondary | ICD-10-CM | POA: Diagnosis not present

## 2019-04-14 DIAGNOSIS — R300 Dysuria: Secondary | ICD-10-CM | POA: Diagnosis not present

## 2019-04-14 LAB — POC URINALSYSI DIPSTICK (AUTOMATED)
Bilirubin, UA: NEGATIVE
Blood, UA: NEGATIVE
Glucose, UA: POSITIVE — AB
Ketones, UA: NEGATIVE
Leukocytes, UA: NEGATIVE
Nitrite, UA: NEGATIVE
Protein, UA: POSITIVE — AB
Spec Grav, UA: 1.025 (ref 1.010–1.025)
Urobilinogen, UA: 0.2 E.U./dL
pH, UA: 6 (ref 5.0–8.0)

## 2019-04-14 MED ORDER — GABAPENTIN 100 MG PO CAPS: 100.0000 mg | ORAL_CAPSULE | Freq: Three times a day (TID) | ORAL | 0 refills | Status: DC

## 2019-04-14 NOTE — Progress Notes (Signed)
This visit occurred during the SARS-CoV-2 public health emergency.  Safety protocols were in place, including screening questions prior to the visit, additional usage of staff PPE, and extensive cleaning of exam room while observing appropriate contact time as indicated for disinfecting solutions.  He had mild stinging with urination for a few months, no fevers.  H/o UTI in the past, has seen urology prev.    He had shingles on L foot a few months ago.  He had a lot of pain with that at the time.  He doesn't have pain but still has some numbness in the L foot.  Still with "pins and needles" in the L foot, not on the R foot or L hand.  He is off gabapentin in the meantime.  Unclear how much that helped prev.    Meds, vitals, and allergies reviewed.   ROS: Per HPI unless specifically indicated in ROS section   GEN: nad, alert and oriented HEENT:ncat NECK: supple w/o LA CV: rrr. PULM: ctab, no inc wob ABD: soft, +bs EXT: no edema SKIN: no acute rash Dec monofilament sensation L foot but intact DP pulse.

## 2019-04-14 NOTE — Patient Instructions (Addendum)
Restart gabapentin and update [place] if that doesn't help.  Gradually increase the dose.  Take care.  Glad to see you.

## 2019-04-15 LAB — URINE CULTURE
MICRO NUMBER:: 10173376
SPECIMEN QUALITY:: ADEQUATE

## 2019-04-16 DIAGNOSIS — R300 Dysuria: Secondary | ICD-10-CM | POA: Insufficient documentation

## 2019-04-16 DIAGNOSIS — G629 Polyneuropathy, unspecified: Secondary | ICD-10-CM | POA: Insufficient documentation

## 2019-04-16 NOTE — Assessment & Plan Note (Signed)
He has asymmetric decreased sensation, with symptoms noted in the left foot but not the right foot.  Decreased sensation to monofilament.  He still has intact dorsalis pedis pulse.  He has normal sensation in the left hand.  He says this is in the same distribution where he had shingles a few months ago.  I suspect he has persistent local effects from previous shingles.  Discussed options.  Gabapentin may help with the "pins-and-needles" sensation.  He can restart that at a low dose with gradual increasing dose if needed/tolerated.  He will update [place] as needed.  No weakness.  Still okay for outpatient follow-up.

## 2019-04-16 NOTE — Assessment & Plan Note (Signed)
See notes on urine culture.  Still okay for outpatient follow-up.

## 2019-04-18 DIAGNOSIS — R350 Frequency of micturition: Secondary | ICD-10-CM | POA: Diagnosis not present

## 2019-04-18 DIAGNOSIS — N401 Enlarged prostate with lower urinary tract symptoms: Secondary | ICD-10-CM | POA: Diagnosis not present

## 2019-04-18 DIAGNOSIS — R972 Elevated prostate specific antigen [PSA]: Secondary | ICD-10-CM | POA: Diagnosis not present

## 2019-04-22 ENCOUNTER — Other Ambulatory Visit: Payer: Self-pay

## 2019-04-23 DIAGNOSIS — Z961 Presence of intraocular lens: Secondary | ICD-10-CM | POA: Diagnosis not present

## 2019-04-23 DIAGNOSIS — H353131 Nonexudative age-related macular degeneration, bilateral, early dry stage: Secondary | ICD-10-CM | POA: Diagnosis not present

## 2019-04-23 DIAGNOSIS — E103293 Type 1 diabetes mellitus with mild nonproliferative diabetic retinopathy without macular edema, bilateral: Secondary | ICD-10-CM | POA: Diagnosis not present

## 2019-04-23 DIAGNOSIS — H52203 Unspecified astigmatism, bilateral: Secondary | ICD-10-CM | POA: Diagnosis not present

## 2019-04-23 LAB — HM DIABETES EYE EXAM

## 2019-04-24 ENCOUNTER — Other Ambulatory Visit: Payer: Self-pay

## 2019-04-24 ENCOUNTER — Ambulatory Visit: Payer: Medicare HMO | Admitting: Internal Medicine

## 2019-04-24 ENCOUNTER — Encounter: Payer: Self-pay | Admitting: Internal Medicine

## 2019-04-24 VITALS — BP 122/70 | HR 82 | Ht 69.0 in | Wt 166.0 lb

## 2019-04-24 DIAGNOSIS — E213 Hyperparathyroidism, unspecified: Secondary | ICD-10-CM | POA: Diagnosis not present

## 2019-04-24 DIAGNOSIS — E559 Vitamin D deficiency, unspecified: Secondary | ICD-10-CM | POA: Diagnosis not present

## 2019-04-24 LAB — VITAMIN D 25 HYDROXY (VIT D DEFICIENCY, FRACTURES): VITD: 50.17 ng/mL (ref 30.00–100.00)

## 2019-04-24 NOTE — Patient Instructions (Signed)
Please stop at the lab.  Please continue vitamin D 4000 units daily.  Please continue to follow-up with your primary care doctor, with calcium and vitamin D levels yearly.

## 2019-04-24 NOTE — Progress Notes (Signed)
Patient ID: Tony Schnack., male   DOB: 07-Apr-1940, 79 y.o.   MRN: [id number]   This visit occurred during the SARS-CoV-2 public health emergency.  Safety protocols were in place, including screening questions prior to the visit, additional usage of staff PPE, and extensive cleaning of exam room while observing appropriate contact time as indicated for disinfecting solutions.   HPI  Tony Greenwalt. is a 79 y.o.-year-old male, returning for follow-up for history of primary hyperparathyroidism and vitamin D deficiency.    Last visit 1 year ago.  Since last visit, he had a prostate biopsy in 09/2018 for an elevated PSA.  The biopsy was positive for prostate cancer per review of the actual biopsy report. He mentions this was negative... (?).  He had shingles on L foot in 10/2018. This was very painful >> lost 20 lbs.  He was diagnosed with hypercalcemia in 2007 >>  Primary hyperparathyroidism, now status post left inferior parathyroidectomy on 02/28/2018 by Dr. Harlow Asa.  Reviewed history: He was referred to see me in 05/2017.  At that time, his vitamin D was found to be very low and we started him on vitamin D supplementation.  After normalization of his vitamin D, a PTH and a calcium level returned normal.  However, he had one calcium level that was slightly high a month later.  On a subsequent check, this normalized.  Reviewed pertinent labs: Lab Results  Component Value Date   PTH 33 04/24/2018   PTH Comment 04/24/2018   PTH 45 10/23/2017   PTH 61 08/16/2017   PTH 103 (H) 05/09/2017   CALCIUM 9.1 10/10/2018   CALCIUM 9.2 08/27/2018   CALCIUM 9.2 04/24/2018   CALCIUM 9.7 02/25/2018   CALCIUM 11.0 (H) 10/23/2017   CALCIUM 9.5 09/07/2017   CALCIUM 10.5 (H) 09/06/2017   CALCIUM 10.1 08/16/2017   CALCIUM 11.1 (H) 05/09/2017   CALCIUM 10.0 05/17/2016  08/04/2016: Magnesium 2.3, phosphorus 3.9, both normal; calcium 9.2  His 24-hour urine calcium was actually low normal: Component      Latest Ref Rng & Units 11/01/2017  Creatinine, 24H Ur     0.50 - 2.15 g/24 h 1.11  Calcium, 24H Urine     mg/24 h 56  FECa normal, at 0.06.  Magnesium and phosphorus were normal: Component     Latest Ref Rng & Units 10/23/2017  Phosphorus     2.3 - 4.6 mg/dL 3.2  Magnesium     1.5 - 2.5 mg/dL 2.1   Technetium sestamibi scan (12/11/2017) and this was positive for a left inferior parathyroid adenoma  Thyroid ultrasound (01/07/2018): 1. Positive for a 1.0 x 0.8 x 0.7 cm hypoechoic solid nodule deep to the left inferior gland in the region of abnormal sestamibi retention. Findings are consistent with hyperfunctioning parathyroid adenoma. 2. Diffusely heterogeneous and mildly enlarged thyroid gland with incidental small thyroid nodules bilaterally. None of the nodules meet criteria for further evaluation.  Patient had left inferior parathyroidectomy on 02/28/2018 -Dr. Harlow Asa. Pathology: 0.429 g parathyroid adenoma measuring 1.7 x 1.2 x 0.4 cm  PTH and calcium decreased after surgery as follows: Calcium from 11.1 >> 9.3, PTH from 103 >> 35.  He feels more energetic after his surgery.  Other history reviewed: No history of fractures or osteoporosis. H/o few falls (stumbles  -had a stroke ~2015)  She had one episode of kidney stones in 2014.  She has diabetic nephropathy.  Last BUN/Cr: Lab Results  Component Value Date   BUN 34 (  H) 10/10/2018   BUN 29 (H) 08/27/2018   CREATININE 1.70 (H) 10/15/2018   CREATININE 1.66 (H) 10/10/2018   He is not on HCTZ.  Vitamin D deficiency:  He continues on 4000 units vitamin D daily.  Latest vitamin D level was normal: Lab Results  Component Value Date   VD25OH 58.50 04/24/2018   VD25OH 39.93 08/16/2017   VD25OH 19.01 (L) 05/09/2017   No FH of hypercalcemia, pituitary tumors, thyroid cancer, or osteoporosis.   She also has a history of HL, HTN, DM2. Reviewed latest HbA1c - In the prediabetic range. Lab Results  Component Value Date    HGBA1C 5.9 (H) 10/10/2018   Pt had a right PCA stroke in 08/2017.  He had loss of vision in L eye after the stroke >> resolved.  He remains on Plavix.  ROS: Constitutional: + weight gain/+ weight loss, no fatigue, no subjective hyperthermia, no subjective hypothermia Eyes: no blurry vision, no xerophthalmia ENT: no sore throat, no nodules palpated in neck, no dysphagia, no odynophagia, no hoarseness Cardiovascular: no CP/no SOB/no palpitations/no leg swelling Respiratory: no cough/no SOB/no wheezing Gastrointestinal: no N/no V/no D/no C/no acid reflux Musculoskeletal: no muscle aches/no joint aches Skin: no rashes, no hair loss Neurological: no tremors/no numbness/no tingling/no dizziness  I reviewed pt's medications, allergies, PMH, social hx, family hx, and changes were documented in the history of present illness. Otherwise, unchanged from my initial visit note.  Past Medical History:  Diagnosis Date  . Cataract    in both eyes    had cataracts surgically removed  . Complication of anesthesia    reports during prostate bx year ago , his BP "dropped real low" and he had to stay overnight after what was suposed to be an amulatory surgery   . CVA (cerebral infarction)   . Diabetes mellitus   . ED (erectile dysfunction)   . Elevated PSA   . Frequency of urination   . GERD (gastroesophageal reflux disease)   . History of nephrolithiasis   . Hyperlipidemia   . Hypertension   . Lung nodule   . Psoriasis   . PSVT (paroxysmal supraventricular tachycardia) ([place])   . Stroke ([place])    2015  . Stroke ([place]) 08/2017   reports no lasting deficits since stroke, endorses that he feels back to regular self since before stroke    Past Surgical History:  Procedure Laterality Date  . CATARACT EXTRACTION W/ INTRAOCULAR LENS IMPLANT Right 03/30/12   Dr Kathrin Penner  . COLONOSCOPY     most recent 2019  . 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 NM MYOVIEW LTD      normal EF 56% 03/08  . PARATHYROIDECTOMY Left 02/28/2018   Procedure: LEFT INFERIOR PARATHYROIDECTOMY;  Surgeon: Armandina Gemma, MD;  Location: [facility];  Service: General;  Laterality: Left;  . POLYPECTOMY    . PROSTATE BIOPSY  01/05/2012   Procedure: BIOPSY TRANSRECTAL ULTRASONIC PROSTATE (TUBP);  Surgeon: [name], MD;  Location: [facility];  Service: Urology;  Laterality: N/A;  . PROSTATE BIOPSY N/A 10/14/2018   Procedure: BIOPSY TRANSRECTAL ULTRASONIC PROSTATE (TUBP);  Surgeon: Raynelle Bring, MD;  Location: [facility];  Service: Urology;  Laterality: N/A;  . TEE WITHOUT CARDIOVERSION N/A 05/21/2014   Procedure: TRANSESOPHAGEAL ECHOCARDIOGRAM (TEE);  Surgeon: Jerline Pain, MD;  Location: [place] [facility];  Service: Cardiovascular;  Laterality: N/A;  . TONSILLECTOMY  1960   Social History   Socioeconomic History  . Marital status:  Married    Spouse name: Peter Congo  . Number of children: 2  . Years of education: HS  . Highest education level: Not on file  Occupational History  . Occupation: Architect- paving    Comment: Retired  Tobacco Use  . Smoking status: Former Smoker    Packs/day: 1.00    Years: 5.00    Pack years: 5.00    Types: Cigarettes  . Smokeless tobacco: Current User    Types: Chew  . Tobacco comment: QUIT SMOKING CIGARETTES 50 YRS AGO--  CHEWED TOBACCO FOR 40 YRS  Substance and Sexual Activity  . Alcohol use: No    Alcohol/week: 0.0 standard drinks  . Drug use: No  . Sexual activity: Not Currently  Other Topics Concern  . Not on file  Social History Narrative   Has living will   Requests wife as health care POA. Children would be alternate   Would accept resuscitation attempts   Not sure about tube feeds      Lives at home with his wife   Right handed    Caffeine: occasional    Social Determinants of Health   Financial Resource Strain:   . Difficulty of Paying Living Expenses: Not on file  Food Insecurity:   . Worried About Sales executive in the Last Year: Not on file  . Ran Out of Food in the Last Year: Not on file  Transportation Needs:   . Lack of Transportation (Medical): Not on file  . Lack of Transportation (Non-Medical): Not on file  Physical Activity:   . Days of Exercise per Week: Not on file  . Minutes of Exercise per Session: Not on file  Stress:   . Feeling of Stress : Not on file  Social Connections:   . Frequency of Communication with Friends and Family: Not on file  . Frequency of Social Gatherings with Friends and Family: Not on file  . Attends Religious Services: Not on file  . Active Member of Clubs or Organizations: Not on file  . Attends Archivist Meetings: Not on file  . Marital Status: Not on file  Intimate Partner Violence:   . Fear of Current or Ex-Partner: Not on file  . Emotionally Abused: Not on file  . Physically Abused: Not on file  . Sexually Abused: Not on file   Current Outpatient Medications on File Prior to Visit  Medication Sig Dispense Refill  . amLODipine (NORVASC) 10 MG tablet TAKE 1 TABLET BY MOUTH EVERY DAY 90 tablet 3  . aspirin EC 81 MG tablet Take 81 mg by mouth daily.    Marland Kitchen atorvastatin (LIPITOR) 80 MG tablet Take 1 tablet (80 mg total) by mouth daily. 90 tablet 3  . Cholecalciferol (VITAMIN D3) 50 MCG (2000 UT) TABS Take 4,000 Units by mouth daily.    . clopidogrel (PLAVIX) 75 MG tablet Take 1 tablet (75 mg total) by mouth daily. 90 tablet 3  . gabapentin (NEURONTIN) 100 MG capsule Take 1-2 capsules (100-200 mg total) by mouth 3 (three) times daily. 90 capsule 0  . glipiZIDE (GLUCOTROL XL) 5 MG 24 hr tablet TAKE 1 TABLET BY MOUTH EVERY DAY WITH BREAKFAST 90 tablet 3  . hydrocortisone 2.5 % cream Apply topically 3 (three) times daily as needed. (Patient taking differently: Apply 1 application topically 3 (three) times daily as needed (rash). ) 28 g 3  . ketoconazole (NIZORAL) 2 % shampoo Apply 2-3 times a week--leave no for 5 minutes (Patient taking  differently: Apply 1 application topically once a week. ) 120 mL 5  . lisinopril (ZESTRIL) 40 MG tablet TAKE 1 TABLET BY MOUTH EVERY DAY 90 tablet 3  . metFORMIN (GLUCOPHAGE) 1000 MG tablet TAKE 1 TABLET BY MOUTH TWICE A DAY WITH MEALS 180 tablet 1  . mometasone (ELOCON) 0.1 % cream APPLY 1 APPLICATION TOPICALLY 2 (TWO) TIMES DAILY AS NEEDED (SKIN). (Patient taking differently: Apply 1 application topically 2 (two) times daily as needed (psoriasis). ) 45 g 1  . ONETOUCH DELICA LANCETS FINE MISC Use 1 daily to obtain blood sample Dx Code E11.21 100 each 3  . ONETOUCH VERIO test strip USE 1 STRIP DAILY TO CHECK BLOOD SUGAR DX CODE E11.21 100 strip 3   No current facility-administered medications on file prior to visit.   Allergies  Allergen Reactions  . Poison Ivy Extract Rash   Family History  Problem Relation Age of Onset  . Cancer Mother        breast cancer  . Diabetes Mother   . Colon polyps Brother   . Heart disease Neg Hx   . Colon cancer Neg Hx   . Esophageal cancer Neg Hx   . Rectal cancer Neg Hx   . Stomach cancer Neg Hx     PE: BP 122/70   Pulse 82   Ht 5\' 9"  (1.753 m)   Wt 166 lb (75.3 kg)   SpO2 97%   BMI 24.51 kg/m  Wt Readings from Last 3 Encounters:  04/24/19 166 lb (75.3 kg)  04/14/19 165 lb 8 oz (75.1 kg)  12/09/18 149 lb (67.6 kg)   Constitutional: normal weight, in NAD Eyes: PERRLA, EOMI, no exophthalmos ENT: moist mucous membranes, no thyromegaly, no cervical lymphadenopathy Cardiovascular: RRR, No MRG Respiratory: CTA B Gastrointestinal: abdomen soft, NT, ND, BS+ Musculoskeletal: no deformities, strength intact in all 4 Skin: moist, warm, no rashes Neurological: no tremor with outstretched hands, DTR normal in all 4  Assessment: 1.  History of primary hyperparathyroidism  2. Vitamin D deficiency  Plan: 1.  History of primary hyperparathyroidism Patient with history of elevated calcium with the highest being 11.1 a year ago.  A corresponding  intact PTH was also high, at 103.  However, at the same time, his vitamin D was very low, 19.  We repleted his vitamin D but calcium remains elevated.  He also had one episode of nephrolithiasis.  Otherwise, no osteoporosis, depression, bone pain. -We investigated him for primary-parathyroidism: Calcium was high, PTH was inappropriately suppressed, vitamin D level normal, magnesium, phosphorus, and 24-hour urine calcium also normal.  At the initial sestamibi parathyroid scan was positive for a left inferior parathyroid adenoma.  He had parathyroidectomy on 02/28/2018 and the pathology was positive for 0.4 g parathyroid adenoma.  Postop, both calcium and PTH were normal. -Most recent calcium level was normal in 09/2018 -At this visit we will recheck his calcium, PTH and vitamin D level -If labs are normal, from now and he can follow-up with PCP only with annual calcium and vitamin D levels  2. Vitamin D deficiency  -Latest vitamin D level was normal in 04/2018 -Continue 4000 units vitamin D daily -We will recheck his calcium and PTH level today  Component     Latest Ref Rng & Units 04/24/2019  Calcium     8.6 - 10.2 mg/dL 9.7  PTH, Intact     15 - 65 pg/mL 54  PTH Interp      Comment  VITD  30.00 - 100.00 ng/mL 50.17   All labs are excellent.  Philemon Kingdom, MD PhD [facility] Endocrinology

## 2019-04-25 LAB — PTH, INTACT AND CALCIUM
Calcium: 9.7 mg/dL (ref 8.6–10.2)
PTH: 54 pg/mL (ref 15–65)

## 2019-05-12 ENCOUNTER — Other Ambulatory Visit: Payer: Self-pay | Admitting: Internal Medicine

## 2019-06-21 DIAGNOSIS — R69 Illness, unspecified: Secondary | ICD-10-CM | POA: Diagnosis not present

## 2019-06-28 ENCOUNTER — Other Ambulatory Visit: Payer: Self-pay

## 2019-06-28 ENCOUNTER — Encounter (HOSPITAL_BASED_OUTPATIENT_CLINIC_OR_DEPARTMENT_OTHER): Payer: Self-pay

## 2019-06-28 ENCOUNTER — Emergency Department (HOSPITAL_BASED_OUTPATIENT_CLINIC_OR_DEPARTMENT_OTHER)
Admission: EM | Admit: 2019-06-28 | Discharge: 2019-06-28 | Disposition: A | Discharge status: Home / Self Care | Payer: Medicare HMO | Attending: Emergency Medicine | Admitting: Emergency Medicine

## 2019-06-28 DIAGNOSIS — W548XXA Other contact with dog, initial encounter: Secondary | ICD-10-CM | POA: Diagnosis not present

## 2019-06-28 DIAGNOSIS — E119 Type 2 diabetes mellitus without complications: Secondary | ICD-10-CM | POA: Diagnosis not present

## 2019-06-28 DIAGNOSIS — Z23 Encounter for immunization: Secondary | ICD-10-CM | POA: Insufficient documentation

## 2019-06-28 DIAGNOSIS — N183 Chronic kidney disease, stage 3 unspecified: Secondary | ICD-10-CM | POA: Diagnosis not present

## 2019-06-28 DIAGNOSIS — Z7984 Long term (current) use of oral hypoglycemic drugs: Secondary | ICD-10-CM | POA: Diagnosis not present

## 2019-06-28 DIAGNOSIS — I129 Hypertensive chronic kidney disease with stage 1 through stage 4 chronic kidney disease, or unspecified chronic kidney disease: Secondary | ICD-10-CM | POA: Insufficient documentation

## 2019-06-28 DIAGNOSIS — Y939 Activity, unspecified: Secondary | ICD-10-CM | POA: Diagnosis not present

## 2019-06-28 DIAGNOSIS — Y929 Unspecified place or not applicable: Secondary | ICD-10-CM | POA: Insufficient documentation

## 2019-06-28 DIAGNOSIS — S61411A Laceration without foreign body of right hand, initial encounter: Secondary | ICD-10-CM | POA: Diagnosis not present

## 2019-06-28 DIAGNOSIS — S61441A Puncture wound with foreign body of right hand, initial encounter: Secondary | ICD-10-CM | POA: Insufficient documentation

## 2019-06-28 DIAGNOSIS — Z7982 Long term (current) use of aspirin: Secondary | ICD-10-CM | POA: Diagnosis not present

## 2019-06-28 DIAGNOSIS — Y999 Unspecified external cause status: Secondary | ICD-10-CM | POA: Diagnosis not present

## 2019-06-28 DIAGNOSIS — Z87891 Personal history of nicotine dependence: Secondary | ICD-10-CM | POA: Diagnosis not present

## 2019-06-28 DIAGNOSIS — Z79899 Other long term (current) drug therapy: Secondary | ICD-10-CM | POA: Diagnosis not present

## 2019-06-28 MED ORDER — AMOXICILLIN-POT CLAVULANATE 875-125 MG PO TABS: 1.0000 | ORAL_TABLET | Freq: Two times a day (BID) | ORAL | 0 refills | Status: DC

## 2019-06-28 MED ORDER — TETANUS-DIPHTH-ACELL PERTUSSIS 5-2.5-18.5 LF-MCG/0.5 IM SUSP
0.5000 mL | Freq: Once | INTRAMUSCULAR | Status: AC
  Administered 2019-06-28: 23:00:00 0.5 mL via INTRAMUSCULAR
  Filled 2019-06-28: qty 0.5

## 2019-06-28 NOTE — ED Provider Notes (Signed)
[facility]PART[facility] Provider Note   CSN: [id number] Arrival date & time: 06/28/19  2046     History Chief Complaint  Patient presents with  . Extremity Laceration    Tony Jackson. is a 79 y.o. male.  Patient is a 79 year old male with a history of diabetes, CVA, hypertension, hyperlipidemia who is presenting today after his dog scratched his right hand causing a cut.  He denies being bit by the dog.  He denies any numbness or tingling of his fingers and has full function.  Unknown last tetanus shot.  In our system last tetanus was 2014.  The history is provided by the patient.       Past Medical History:  Diagnosis Date  . Cataract    in both eyes    had cataracts surgically removed  . Complication of anesthesia    reports during prostate bx year ago , his BP "dropped real low" and he had to stay overnight after what was suposed to be an amulatory surgery   . CVA (cerebral infarction)   . Diabetes mellitus   . ED (erectile dysfunction)   . Elevated PSA   . Frequency of urination   . GERD (gastroesophageal reflux disease)   . History of nephrolithiasis   . Hyperlipidemia   . Hypertension   . Lung nodule   . Psoriasis   . PSVT (paroxysmal supraventricular tachycardia) ([place])   . Stroke ([place])    2015  . Stroke ([place]) 08/2017   reports no lasting deficits since stroke, endorses that he feels back to regular self since before stroke     Patient Active Problem List   Diagnosis Date Noted  . Dysuria 04/16/2019  . Neuropathy 04/16/2019  . Varicella zoster 11/27/2018  . Radicular syndrome of left leg 11/20/2018  . Elevated PSA 10/14/2018  . Status post biopsy 10/14/2018  . Hyperparathyroidism ([place]) 06/18/2017  . Vitamin D deficiency 06/18/2017  . History of stroke 02/18/2016  . CKD stage 3 due to type 2 diabetes mellitus ([place]) 01/27/2015  . Advance directive discussed with patient 01/27/2015  . SVT (supraventricular tachycardia) ([place])  09/24/2014  . Essential hypertension   . Late effect of cerebrovascular accident (CVA)   . Pulmonary nodules 10/21/2013  . CAD (coronary artery disease) 10/08/2013  . Prostatic intraepithelial neoplasia 04/12/2012  . Actinic keratosis 10/12/2011  . Routine general medical examination at a health care facility 10/07/2010  . NEPHROLITHIASIS, HX OF 03/18/2010  . ERECTILE DYSFUNCTION, ORGANIC 02/19/2008  . Type 2 diabetes mellitus with renal manifestations ([place]) 11/13/2006  . Hyperlipemia 11/13/2006  . Seborrheic dermatitis 11/13/2006    Past Surgical History:  Procedure Laterality Date  . CATARACT EXTRACTION W/ INTRAOCULAR LENS IMPLANT Right 03/30/12   Dr Kathrin Penner  . COLONOSCOPY     most recent 2019  . 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 NM MYOVIEW LTD     normal EF 56% 03/08  . PARATHYROIDECTOMY Left 02/28/2018   Procedure: LEFT INFERIOR PARATHYROIDECTOMY;  Surgeon: Armandina Gemma, MD;  Location: [facility];  Service: General;  Laterality: Left;  . POLYPECTOMY    . PROSTATE BIOPSY  01/05/2012   Procedure: BIOPSY TRANSRECTAL ULTRASONIC PROSTATE (TUBP);  Surgeon: [name], MD;  Location: [facility];  Service: Urology;  Laterality: N/A;  . PROSTATE BIOPSY N/A 10/14/2018   Procedure: BIOPSY TRANSRECTAL ULTRASONIC PROSTATE (TUBP);  Surgeon: Raynelle Bring, MD;  Location: [facility];  Service: Urology;  Laterality:  N/A;  . TEE WITHOUT CARDIOVERSION N/A 05/21/2014   Procedure: TRANSESOPHAGEAL ECHOCARDIOGRAM (TEE);  Surgeon: Jerline Pain, MD;  Location[facility];  Service: Cardiovascular;  Laterality: N/A;  . TONSILLECTOMY  1960       Family History  Problem Relation Age of Onset  . Cancer Mother        breast cancer  . Diabetes Mother   . Colon polyps Brother   . Heart disease Neg Hx   . Colon cancer Neg Hx   . Esophageal cancer Neg Hx   . Rectal cancer Neg Hx   . Stomach cancer Neg Hx     Social History   Tobacco Use  .  Smoking status: Former Smoker    Packs/day: 1.00    Years: 5.00    Pack years: 5.00    Types: Cigarettes  . Smokeless tobacco: Current User    Types: Chew  . Tobacco comment: QUIT SMOKING CIGARETTES 50 YRS AGO--  CHEWED TOBACCO FOR 40 YRS  Substance Use Topics  . Alcohol use: Not Currently    Alcohol/week: 0.0 standard drinks  . Drug use: No    Home Medications Prior to Admission medications   Medication Sig Start Date End Date Taking? Authorizing Provider  amLODipine (NORVASC) 10 MG tablet TAKE 1 TABLET BY MOUTH EVERY DAY 02/27/19   Viviana Simpler I, MD  aspirin EC 81 MG tablet Take 81 mg by mouth daily.    [provider]  atorvastatin (LIPITOR) 80 MG tablet Take 1 tablet (80 mg total) by mouth daily. 02/27/19   [name], MD  Cholecalciferol (VITAMIN D3) 50 MCG (2000 UT) TABS Take 4,000 Units by mouth daily.    [provider]  clopidogrel (PLAVIX) 75 MG tablet Take 1 tablet (75 mg total) by mouth daily. 12/31/18   [name], MD  gabapentin (NEURONTIN) 100 MG capsule Take 1-2 capsules (100-200 mg total) by mouth 3 (three) times daily. 04/14/19   Tonia Ghent, MD  glipiZIDE (GLUCOTROL XL) 5 MG 24 hr tablet TAKE 1 TABLET BY MOUTH EVERY DAY WITH BREAKFAST 02/27/19   Viviana Simpler I, MD  hydrocortisone 2.5 % cream Apply topically 3 (three) times daily as needed. Patient taking differently: Apply 1 application topically 3 (three) times daily as needed (rash).  08/27/18   [name], MD  ketoconazole (NIZORAL) 2 % shampoo Apply 2-3 times a week--leave no for 5 minutes Patient taking differently: Apply 1 application topically once a week.  08/27/18   [name], MD  lisinopril (ZESTRIL) 40 MG tablet TAKE 1 TABLET BY MOUTH EVERY DAY 02/03/19   Viviana Simpler I, MD  metFORMIN (GLUCOPHAGE) 1000 MG tablet TAKE 1 TABLET BY MOUTH TWICE A DAY WITH MEALS 03/24/19   Viviana Simpler I, MD  mometasone (ELOCON) 0.1 % cream APPLY 1 APPLICATION TOPICALLY 2 (TWO)  TIMES DAILY AS NEEDED (SKIN). Patient taking differently: Apply 1 application topically 2 (two) times daily as needed (psoriasis).  10/03/17   [name], MD  [facility] DELICA LANCETS FINE MISC Use 1 daily to obtain blood sample Dx Code E11.21 08/10/16   [name], MD  [facility] VERIO test strip USE 1 STRIP DAILY TO CHECK BLOOD SUGAR DX CODE E11.21 03/24/19   [name], MD    Allergies    Poison ivy extract  Review of Systems   Review of Systems  All other systems reviewed and are negative.   Physical Exam Updated Vital Signs BP Marland Kitchen)  171/73 (BP Location: Left Arm)   Pulse 71   Temp 97.6 F (36.4 C) (Oral)   Resp 16   Ht 5\' 10"  (1.778 m)   Wt 72.6 kg   SpO2 99%   BMI 22.96 kg/m   Physical Exam Vitals and nursing note reviewed.  Constitutional:      Appearance: Normal appearance. He is normal weight.  Cardiovascular:     Rate and Rhythm: Normal rate.  Pulmonary:     Effort: Pulmonary effort is normal.  Musculoskeletal:       Hands:  Skin:    General: Skin is warm and dry.  Neurological:     Mental Status: He is alert. Mental status is at baseline.  Psychiatric:        Mood and Affect: Mood normal.        Behavior: Behavior normal.     ED Results / Procedures / Treatments   Labs (all labs ordered are listed, but only abnormal results are displayed) Labs Reviewed - No data to display  EKG None  Radiology No results found.  Procedures Procedures (including critical care time)  Medications Ordered in ED Medications  Tdap (BOOSTRIX) injection 0.5 mL (has no administration in time range)    ED Course  I have reviewed the triage vital signs and the nursing notes.  Pertinent labs & imaging results that were available during my care of the patient were reviewed by me and considered in my medical decision making (see chart for details).    MDM Rules/Calculators/A&P                       Patient with skin tear of the right hand after  being scratched by his dog.  No bleeding at this time and full neurologic function.  Patient's tetanus shot was updated.  Patient also had Steri-Strips applied.  We will do 5-day course of antibiotics due to concern for contamination from the dog claws.  Final Clinical Impression(s) / ED Diagnoses Final diagnoses:  Laceration of right hand without foreign body, initial encounter    Rx / DC Orders ED Discharge Orders    None       Blanchie Dessert, MD 06/28/19 2231

## 2019-06-28 NOTE — ED Triage Notes (Signed)
Lac to dorsal wrist. Pt states he was scratched by his dog.

## 2019-07-01 ENCOUNTER — Telehealth: Payer: Self-pay

## 2019-07-01 NOTE — Telephone Encounter (Signed)
Left message for pt to call and let us know how he was doing after recent ER visit for Dog Scratch on his hand.  If pt calls back and I am not available, please ask how he is and note. Thanks!

## 2019-08-07 DIAGNOSIS — R69 Illness, unspecified: Secondary | ICD-10-CM | POA: Diagnosis not present

## 2019-08-29 ENCOUNTER — Encounter: Payer: Self-pay | Admitting: Internal Medicine

## 2019-08-29 ENCOUNTER — Ambulatory Visit (INDEPENDENT_AMBULATORY_CARE_PROVIDER_SITE_OTHER): Payer: Medicare HMO | Admitting: Internal Medicine

## 2019-08-29 ENCOUNTER — Other Ambulatory Visit: Payer: Self-pay

## 2019-08-29 VITALS — BP 116/72 | HR 74 | Temp 97.6°F | Ht 68.0 in | Wt 159.0 lb

## 2019-08-29 DIAGNOSIS — N183 Chronic kidney disease, stage 3 unspecified: Secondary | ICD-10-CM

## 2019-08-29 DIAGNOSIS — E1122 Type 2 diabetes mellitus with diabetic chronic kidney disease: Secondary | ICD-10-CM | POA: Diagnosis not present

## 2019-08-29 DIAGNOSIS — I471 Supraventricular tachycardia: Secondary | ICD-10-CM

## 2019-08-29 DIAGNOSIS — I6930 Unspecified sequelae of cerebral infarction: Secondary | ICD-10-CM | POA: Diagnosis not present

## 2019-08-29 DIAGNOSIS — Z7189 Other specified counseling: Secondary | ICD-10-CM

## 2019-08-29 DIAGNOSIS — E1121 Type 2 diabetes mellitus with diabetic nephropathy: Secondary | ICD-10-CM

## 2019-08-29 DIAGNOSIS — N4231 Prostatic intraepithelial neoplasia: Secondary | ICD-10-CM | POA: Diagnosis not present

## 2019-08-29 DIAGNOSIS — Z0000 Encounter for general adult medical examination without abnormal findings: Secondary | ICD-10-CM | POA: Diagnosis not present

## 2019-08-29 LAB — CBC
HCT: 35.5 % — ABNORMAL LOW (ref 39.0–52.0)
Hemoglobin: 12.4 g/dL — ABNORMAL LOW (ref 13.0–17.0)
MCHC: 34.9 g/dL (ref 30.0–36.0)
MCV: 90.8 fl (ref 78.0–100.0)
Platelets: 232 10*3/uL (ref 150.0–400.0)
RBC: 3.91 Mil/uL — ABNORMAL LOW (ref 4.22–5.81)
RDW: 13.6 % (ref 11.5–15.5)
WBC: 8.7 10*3/uL (ref 4.0–10.5)

## 2019-08-29 LAB — LIPID PANEL
Cholesterol: 146 mg/dL (ref 0–200)
HDL: 48.1 mg/dL (ref >39.00)
LDL Cholesterol: 65 mg/dL (ref 0–99)
NonHDL: 97.8
Total CHOL/HDL Ratio: 3
Triglycerides: 164 mg/dL — ABNORMAL HIGH (ref 0.0–149.0)
VLDL: 32.8 mg/dL (ref 0.0–40.0)

## 2019-08-29 LAB — HEPATIC FUNCTION PANEL
ALT: 29 U/L (ref 0–53)
AST: 19 U/L (ref 0–37)
Albumin: 4.7 g/dL (ref 3.5–5.2)
Alkaline Phosphatase: 49 U/L (ref 39–117)
Bilirubin, Direct: 0.3 mg/dL (ref 0.0–0.3)
Total Bilirubin: 1.1 mg/dL (ref 0.2–1.2)
Total Protein: 7 g/dL (ref 6.0–8.3)

## 2019-08-29 LAB — RENAL FUNCTION PANEL
Albumin: 4.7 g/dL (ref 3.5–5.2)
BUN: 35 mg/dL — ABNORMAL HIGH (ref 6–23)
CO2: 23 mEq/L (ref 19–32)
Calcium: 9.6 mg/dL (ref 8.4–10.5)
Chloride: 106 mEq/L (ref 96–112)
Creatinine, Ser: 1.6 mg/dL — ABNORMAL HIGH (ref 0.40–1.50)
GFR: 41.94 mL/min — ABNORMAL LOW (ref >60.00)
Glucose, Bld: 80 mg/dL (ref 70–99)
Phosphorus: 3.6 mg/dL (ref 2.3–4.6)
Potassium: 4.6 mEq/L (ref 3.5–5.1)
Sodium: 140 mEq/L (ref 135–145)

## 2019-08-29 LAB — HM DIABETES FOOT EXAM

## 2019-08-29 LAB — HEMOGLOBIN A1C: Hgb A1c MFr Bld: 6.1 % (ref 4.6–6.5)

## 2019-08-29 NOTE — Assessment & Plan Note (Signed)
See social history 

## 2019-08-29 NOTE — Assessment & Plan Note (Signed)
I have personally reviewed the Medicare Annual Wellness questionnaire and have noted 1. The patient's medical and social history 2. Their use of alcohol, tobacco or illicit drugs 3. Their current medications and supplements 4. The patient's functional ability including ADL's, fall risks, home safety risks and hearing or visual             impairment. 5. Diet and physical activities 6. Evidence for depression or mood disorders  The patients weight, height, BMI and visual acuity have been recorded in the chart I have made referrals, counseling and provided education to the patient based review of the above and I have provided the pt with a written personalized care plan for preventive services.  I have provided you with a copy of your personalized plan for preventive services. Please take the time to review along with your updated medication list.  Overdue for repeat colon---will ask Dr Loletha Carrow to reschedule Follows prostate with urologist Discussed exercise Flu vaccine in the fall Consider shingrix at pharmacy

## 2019-08-29 NOTE — Assessment & Plan Note (Signed)
Seems to have mild cognitive and balance issues On ASA, plavix and statin

## 2019-08-29 NOTE — Progress Notes (Signed)
Subjective:    Patient ID: Tony Jackson., male    DOB: 1941-02-02, 79 y.o.   MRN: [id number]  HPI Here for Medicare wellness visit and follow up of chronic health conditions This visit occurred during the SARS-CoV-2 public health emergency.  Safety protocols were in place, including screening questions prior to the visit, additional usage of staff PPE, and extensive cleaning of exam room while observing appropriate contact time as indicated for disinfecting solutions.   Reviewed form and advanced directives Reviewed other doctors No tobacco or alcohol Not really exercising---discussed Vision is fine Hearing okay [place] once when getting over the shingles--out in woods walking dog. Minor scratch No depression or anhedonia Independent with instrumental ADLs Mild memory issues--no worse (this and balance issues since stroke)  Still having numbness in his left foot Slight tingling in right foot No injury and no sig pain Gabapentin did not make any difference  Checks sugars rarely Usually still around 100 Known CKD3b  Still sees Dr Alinda Money Last biopsy showed PIN  No chest pain No SOB No palpitations  No dizziness or syncope No edema  Current Outpatient Medications on File Prior to Visit  Medication Sig Dispense Refill  . amLODipine (NORVASC) 10 MG tablet TAKE 1 TABLET BY MOUTH EVERY DAY 90 tablet 3  . aspirin EC 81 MG tablet Take 81 mg by mouth daily.    Marland Kitchen atorvastatin (LIPITOR) 80 MG tablet Take 1 tablet (80 mg total) by mouth daily. 90 tablet 3  . Cholecalciferol (VITAMIN D3) 50 MCG (2000 UT) TABS Take 4,000 Units by mouth daily.    . clopidogrel (PLAVIX) 75 MG tablet Take 1 tablet (75 mg total) by mouth daily. 90 tablet 3  . glipiZIDE (GLUCOTROL XL) 5 MG 24 hr tablet TAKE 1 TABLET BY MOUTH EVERY DAY WITH BREAKFAST 90 tablet 3  . hydrocortisone 2.5 % cream Apply topically 3 (three) times daily as needed. (Patient taking differently: Apply 1 application topically 3  (three) times daily as needed (rash). ) 28 g 3  . ketoconazole (NIZORAL) 2 % shampoo Apply 2-3 times a week--leave no for 5 minutes (Patient taking differently: Apply 1 application topically once a week. ) 120 mL 5  . lisinopril (ZESTRIL) 40 MG tablet TAKE 1 TABLET BY MOUTH EVERY DAY 90 tablet 3  . metFORMIN (GLUCOPHAGE) 1000 MG tablet TAKE 1 TABLET BY MOUTH TWICE A DAY WITH MEALS 180 tablet 1  . mometasone (ELOCON) 0.1 % cream APPLY 1 APPLICATION TOPICALLY 2 (TWO) TIMES DAILY AS NEEDED (SKIN). (Patient taking differently: Apply 1 application topically 2 (two) times daily as needed (psoriasis). ) 45 g 1  . ONETOUCH DELICA LANCETS FINE MISC Use 1 daily to obtain blood sample Dx Code E11.21 100 each 3  . ONETOUCH VERIO test strip USE 1 STRIP DAILY TO CHECK BLOOD SUGAR DX CODE E11.21 100 strip 3   No current facility-administered medications on file prior to visit.    Allergies  Allergen Reactions  . Poison Ivy Extract Rash    Past Medical History:  Diagnosis Date  . Cataract    in both eyes    had cataracts surgically removed  . Complication of anesthesia    reports during prostate bx year ago , his BP "dropped real low" and he had to stay overnight after what was suposed to be an amulatory surgery   . CVA (cerebral infarction)   . Diabetes mellitus   . ED (erectile dysfunction)   . Elevated PSA   .  Frequency of urination   . GERD (gastroesophageal reflux disease)   . History of nephrolithiasis   . Hyperlipidemia   . Hypertension   . Lung nodule   . Psoriasis   . PSVT (paroxysmal supraventricular tachycardia) ([place])   . Stroke ([place])    2015  . Stroke ([place]) 08/2017   reports no lasting deficits since stroke, endorses that he feels back to regular self since before stroke     Past Surgical History:  Procedure Laterality Date  . CATARACT EXTRACTION W/ INTRAOCULAR LENS IMPLANT Right 03/30/12   Dr Kathrin Penner  . COLONOSCOPY     most recent 2019  . 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 NM MYOVIEW LTD     normal EF 56% 03/08  . PARATHYROIDECTOMY Left 02/28/2018   Procedure: LEFT INFERIOR PARATHYROIDECTOMY;  Surgeon: Armandina Gemma, MD;  Location: [facility];  Service: General;  Laterality: Left;  . POLYPECTOMY    . PROSTATE BIOPSY  01/05/2012   Procedure: BIOPSY TRANSRECTAL ULTRASONIC PROSTATE (TUBP);  Surgeon: [name], MD;  [facility];  Service: Urology;  Laterality: N/A;  . PROSTATE BIOPSY N/A 10/14/2018   Procedure: BIOPSY TRANSRECTAL ULTRASONIC PROSTATE (TUBP);  Surgeon: Raynelle Bring, MD;  Location: [facility];  Service: Urology;  Laterality: N/A;  . TEE WITHOUT CARDIOVERSION N/A 05/21/2014   Procedure: TRANSESOPHAGEAL ECHOCARDIOGRAM (TEE);  Surgeon: Jerline Pain, MD;  Location: [facility];  Service: Cardiovascular;  Laterality: N/A;  . TONSILLECTOMY  1960    Family History  Problem Relation Age of Onset  . Cancer Mother        breast cancer  . Diabetes Mother   . Colon polyps Brother   . Heart disease Neg Hx   . Colon cancer Neg Hx   . Esophageal cancer Neg Hx   . Rectal cancer Neg Hx   . Stomach cancer Neg Hx     Social History   Socioeconomic History  . Marital status: Married    Spouse name: Peter Congo  . Number of children: 2  . Years of education: HS  . Highest education level: Not on file  Occupational History  . Occupation: Architect- paving    Comment: Retired  Tobacco Use  . Smoking status: Former Smoker    Packs/day: 1.00    Years: 5.00    Pack years: 5.00    Types: Cigarettes  . Smokeless tobacco: Current User    Types: Chew  . Tobacco comment: QUIT SMOKING CIGARETTES 50 YRS AGO--  CHEWED TOBACCO FOR 40 YRS  Vaping Use  . Vaping Use: Never used  Substance and Sexual Activity  . Alcohol use: Not Currently    Alcohol/week: 0.0 standard drinks  . Drug use: No  . Sexual activity: Not Currently  Other Topics Concern  . Not on file  Social History Narrative   Has living  will   Requests wife as health care POA. Children would be alternate   Would accept resuscitation attempts   Not sure about tube feeds      Lives at home with his wife   Right handed    Caffeine: occasional    Social Determinants of Health   Financial Resource Strain:   . Difficulty of Paying Living Expenses:   Food Insecurity:   . Worried About Charity fundraiser in the Last Year:   . [place] in the Last Year:   Transportation Needs:   . Lack of  Transportation (Medical):   Marland Kitchen Lack of Transportation (Non-Medical):   Physical Activity:   . Days of Exercise per Week:   . Minutes of Exercise per Session:   Stress:   . Feeling of Stress :   Social Connections:   . Frequency of Communication with Friends and Family:   . Frequency of Social Gatherings with Friends and Family:   . Attends Religious Services:   . Active Member of Clubs or Organizations:   . Attends Archivist Meetings:   Marland Kitchen Marital Status:   Intimate Partner Violence:   . Fear of Current or Ex-Partner:   . Emotionally Abused:   Marland Kitchen Physically Abused:   . Sexually Abused:    Review of Systems No headaches Appetite is good Weight stable Sleeps well Wears seat belt Teeth okay---keeps up with dentist No heartburn or dysphagia Bowels okay--no blood Slow urinary stream--but no nocturia Has scales on face--in eyebrows.      Objective:   Physical Exam Constitutional:      General: He is not in acute distress.    Appearance: Normal appearance.  HENT:     Head: Normocephalic and atraumatic.     Mouth/Throat:     Mouth: Mucous membranes are moist.     Comments: No lesions Cardiovascular:     Rate and Rhythm: Normal rate and regular rhythm.     Pulses: Normal pulses.     Heart sounds: No murmur heard.  No gallop.   Pulmonary:     Effort: Pulmonary effort is normal.     Breath sounds: Normal breath sounds. No wheezing or rales.  Abdominal:     Palpations: Abdomen is soft.      Tenderness: There is no abdominal tenderness.  Musculoskeletal:     Right lower leg: No edema.     Left lower leg: No edema.  Skin:    General: Skin is warm.     Comments: No foot lesions  Neurological:     Mental Status: He is alert.     Comments: Knows July but not the year President---"Biden, Trump, Obama" 100-93-84-? D-l- Recall 2/3  Slight decrease in fine touch in feet  Psychiatric:        Mood and Affect: Mood normal.        Behavior: Behavior normal.            Assessment & Plan:

## 2019-08-29 NOTE — Assessment & Plan Note (Signed)
Hopefully good control still on the metformin Will check labs Early neuropathy and known CKD3

## 2019-08-29 NOTE — Assessment & Plan Note (Signed)
Not clear that repeat biopsy is appropriate

## 2019-08-29 NOTE — Progress Notes (Signed)
Hearing Screening   Method: Audiometry   125Hz  250Hz  500Hz  1000Hz  2000Hz  3000Hz  4000Hz  6000Hz  8000Hz   Right ear:   20 25 20  25     Left ear:   20 20 20   40    Vision Screening Comments: March 2021

## 2019-08-29 NOTE — Progress Notes (Signed)
I agree with the above plan 

## 2019-08-29 NOTE — Assessment & Plan Note (Signed)
On lisinopril Will recheck labs 

## 2019-08-29 NOTE — Assessment & Plan Note (Signed)
No apparent recurrence 

## 2019-09-09 ENCOUNTER — Telehealth: Payer: Self-pay

## 2019-09-09 NOTE — Telephone Encounter (Signed)
Spoke to Mr Tony Jackson. He requested an appointment to discuss the colonoscopy. Pt has been scheduled with Dr Loletha Carrow for 11-12-2019 @ 9am.

## 2019-09-09 NOTE — Telephone Encounter (Signed)
-----   Message from Victoria, MD sent at 09/08/2019  4:41 PM EDT ----- This patient is overdue for a colonoscopy with me for History colon polyps. His PCP messaged me about this when forwarding me his recent office note.  The patient is on plavix for prior CVA.   I messaged the patient's neurologist, Dr. Antony Contras, who was agreeable to this patient being off plavix 5 days prior to procedure.  If the patient is on aspirin, he can stay on it up to procedure day per our usual protocol.  Please get this patient connected with [facility] for scheduling.  - HD

## 2019-09-19 ENCOUNTER — Other Ambulatory Visit: Payer: Self-pay | Admitting: Internal Medicine

## 2019-09-20 DIAGNOSIS — R69 Illness, unspecified: Secondary | ICD-10-CM | POA: Diagnosis not present

## 2019-10-06 DIAGNOSIS — G8929 Other chronic pain: Secondary | ICD-10-CM | POA: Diagnosis not present

## 2019-10-06 DIAGNOSIS — I2510 Atherosclerotic heart disease of native coronary artery without angina pectoris: Secondary | ICD-10-CM | POA: Diagnosis not present

## 2019-10-06 DIAGNOSIS — D6869 Other thrombophilia: Secondary | ICD-10-CM | POA: Diagnosis not present

## 2019-10-06 DIAGNOSIS — R69 Illness, unspecified: Secondary | ICD-10-CM | POA: Diagnosis not present

## 2019-10-06 DIAGNOSIS — E1142 Type 2 diabetes mellitus with diabetic polyneuropathy: Secondary | ICD-10-CM | POA: Diagnosis not present

## 2019-10-06 DIAGNOSIS — I10 Essential (primary) hypertension: Secondary | ICD-10-CM | POA: Diagnosis not present

## 2019-10-06 DIAGNOSIS — E785 Hyperlipidemia, unspecified: Secondary | ICD-10-CM | POA: Diagnosis not present

## 2019-10-06 DIAGNOSIS — Z008 Encounter for other general examination: Secondary | ICD-10-CM | POA: Diagnosis not present

## 2019-10-06 DIAGNOSIS — I4891 Unspecified atrial fibrillation: Secondary | ICD-10-CM | POA: Diagnosis not present

## 2019-10-06 DIAGNOSIS — N529 Male erectile dysfunction, unspecified: Secondary | ICD-10-CM | POA: Diagnosis not present

## 2019-10-06 DIAGNOSIS — Z7902 Long term (current) use of antithrombotics/antiplatelets: Secondary | ICD-10-CM | POA: Diagnosis not present

## 2019-10-08 DIAGNOSIS — R69 Illness, unspecified: Secondary | ICD-10-CM | POA: Diagnosis not present

## 2019-11-05 DIAGNOSIS — R972 Elevated prostate specific antigen [PSA]: Secondary | ICD-10-CM | POA: Diagnosis not present

## 2019-11-07 ENCOUNTER — Other Ambulatory Visit: Payer: Self-pay | Admitting: Internal Medicine

## 2019-11-12 ENCOUNTER — Ambulatory Visit: Payer: Medicare HMO | Admitting: Gastroenterology

## 2019-11-21 ENCOUNTER — Encounter: Payer: Self-pay | Admitting: Gastroenterology

## 2019-11-21 ENCOUNTER — Ambulatory Visit (INDEPENDENT_AMBULATORY_CARE_PROVIDER_SITE_OTHER): Payer: Medicare HMO | Admitting: Gastroenterology

## 2019-11-21 VITALS — BP 128/62 | HR 84 | Ht 68.0 in | Wt 163.0 lb

## 2019-11-21 DIAGNOSIS — Z86010 Personal history of colonic polyps: Secondary | ICD-10-CM

## 2019-11-21 DIAGNOSIS — Z7901 Long term (current) use of anticoagulants: Secondary | ICD-10-CM | POA: Diagnosis not present

## 2019-11-21 MED ORDER — PLENVU 140 G PO SOLR: 140.0000 g | ORAL | 0 refills | Status: DC

## 2019-11-21 NOTE — Progress Notes (Signed)
[facility] Progress Note  Chief Complaint: History of colon polyp  Subjective  History: Tony Jackson was here to see me with his wife to discuss colonoscopy. 3 small adenomatous polyps August 2011.  Next colonoscopy was with me in May 2019, where there were a few small adenomatous polyps but also a mid transverse polyp at least 30 mm in size removed piecemeal by EMR.  Plan was for a repeat colonoscopy in 6 months, but they decided to put that off due to Covid.  He is on Plavix for history of CVA, and was able to stop it for 5 days before his last colonoscopy.  Tony Jackson has not had any digestive issues lately.  Bowel habits have been regular, no rectal bleeding.  Appetite reportedly good, no dysphagia.  He has had some progressive memory issues and his wife accompanied him today.  I reviewed his most recent primary care note.  ROS: Cardiovascular:  no chest pain Respiratory: no dyspnea Memory issues as noted above Neuropathy left leg most of this year, possibly since shingles outbreak or from diabetes or both Remainder systems negative except as above  The patient's Past Medical, Family and Social History were reviewed and are on file in the EMR. Past Medical History:  Diagnosis Date   Cataract    in both eyes    had cataracts surgically removed   Complication of anesthesia    reports during prostate bx year ago , his BP "dropped real low" and he had to stay overnight after what was suposed to be an amulatory surgery    CVA (cerebral infarction)    Diabetes mellitus    ED (erectile dysfunction)    Elevated PSA    Frequency of urination    GERD (gastroesophageal reflux disease)    History of nephrolithiasis    Hyperlipidemia    Hypertension    Lung nodule    Psoriasis    PSVT (paroxysmal supraventricular tachycardia) ([place])    Stroke ([place])    2015   Stroke ([place]) 08/2017   reports no lasting deficits since stroke, endorses that he feels back to regular self since  before stroke    PCP: Silvio Pate Neurologist: Leonie Man  Objective:  Med list reviewed  Current Outpatient Medications:    amLODipine (NORVASC) 10 MG tablet, TAKE 1 TABLET BY MOUTH EVERY DAY, Disp: 90 tablet, Rfl: 3   aspirin EC 81 MG tablet, Take 81 mg by mouth daily., Disp: , Rfl:    atorvastatin (LIPITOR) 80 MG tablet, Take 1 tablet (80 mg total) by mouth daily., Disp: 90 tablet, Rfl: 3   Cholecalciferol (VITAMIN D3) 50 MCG (2000 UT) TABS, Take 4,000 Units by mouth daily., Disp: , Rfl:    clopidogrel (PLAVIX) 75 MG tablet, Take 1 tablet (75 mg total) by mouth daily., Disp: 90 tablet, Rfl: 3   glipiZIDE (GLUCOTROL XL) 5 MG 24 hr tablet, TAKE 1 TABLET BY MOUTH EVERY DAY WITH BREAKFAST, Disp: 90 tablet, Rfl: 3   hydrocortisone 2.5 % cream, APPLY TOPICALLY 3 TIMES DAILY AS NEEDED., Disp: 28.35 g, Rfl: 3   ketoconazole (NIZORAL) 2 % shampoo, APPLY 2-3 TIMES A WEEK--LEAVE NO FOR 5 MINUTES, Disp: 120 mL, Rfl: 5   lisinopril (ZESTRIL) 40 MG tablet, TAKE 1 TABLET BY MOUTH EVERY DAY, Disp: 90 tablet, Rfl: 3   metFORMIN (GLUCOPHAGE) 1000 MG tablet, TAKE 1 TABLET BY MOUTH TWICE A DAY WITH MEALS, Disp: 180 tablet, Rfl: 3   mometasone (ELOCON) 0.1 % cream, APPLY 1 APPLICATION  TOPICALLY 2 (TWO) TIMES DAILY AS NEEDED (SKIN). (Patient taking differently: Apply 1 application topically 2 (two) times daily as needed (psoriasis). ), Disp: 45 g, Rfl: 1   ONETOUCH DELICA LANCETS FINE MISC, Use 1 daily to obtain blood sample Dx Code E11.21, Disp: 100 each, Rfl: 3   ONETOUCH VERIO test strip, USE 1 STRIP DAILY TO CHECK BLOOD SUGAR DX CODE E11.21, Disp: 100 strip, Rfl: 3   Vital signs in last 24 hrs: Vitals:   11/21/19 1041  BP: 128/62  Pulse: 84    Physical Exam  His wife was present for the entire visit. He is well-appearing, pleasant and conversational.  Gets on exam table without difficulty.  HEENT: sclera anicteric, oral mucosa moist without lesions  Neck: supple, no thyromegaly, JVD or  lymphadenopathy  Cardiac: RRR without murmurs, S1S2 heard, no peripheral edema  Pulm: clear to auscultation bilaterally, normal RR and effort noted  Abdomen: soft, no tenderness, with active bowel sounds. No guarding or palpable hepatosplenomegaly.  Skin; warm and dry, no jaundice or rash  Labs:  CBC Latest Ref Rng & Units 08/29/2019 10/10/2018 08/27/2018  WBC 4.0 - 10.5 K/uL 8.7 6.1 6.3  Hemoglobin 13.0 - 17.0 g/dL 12.4(L) 12.0(L) 13.1  Hematocrit 39 - 52 % 35.5(L) 36.7(L) 39.0  Platelets 150 - 400 K/uL 232.0 222 241.0    ___________________________________________ Radiologic studies:   ____________________________________________ Other:   _____________________________________________ Assessment & Plan  Assessment: Encounter Diagnoses  Name Primary?   Personal history of colonic polyps Yes   Current use of long term anticoagulation    History of advanced adenoma requiring EMR with piecemeal resection May 2019.  He needs a follow-up colonoscopy due to that high risk lesion.  We discussed the procedure and they were agreeable after discussion of risks and benefits.  The benefits and risks of the planned procedure were described in detail with the patient or (when appropriate) their health care proxy.  Risks were outlined as including, but not limited to, bleeding, infection, perforation, adverse medication reaction leading to cardiac or pulmonary decompensation, pancreatitis (if ERCP).  The limitation of incomplete mucosal visualization was also discussed.  No guarantees or warranties were given.  Patient at increased risk for cardiopulmonary complications of procedure due to medical comorbidities.  He will need to be off Plavix 5 days prior and we will ask his primary care provider if they are agreeable.  I expect they will be, as his cerebrovascular condition has been stable for years and he does not need to see his neurologist in the last couple of years.   30 minutes were  spent on this encounter (including chart review, history/exam, counseling/coordination of care, and documentation)  Nelida Meuse III

## 2019-11-21 NOTE — Patient Instructions (Signed)
If you are age 79 or older, your body mass index should be between 23-30. Your Body mass index is 24.78 kg/m. If this is out of the aforementioned range listed, please consider follow up with your Primary Care Provider.  If you are age 16 or younger, your body mass index should be between 19-25. Your Body mass index is 24.78 kg/m. If this is out of the aformentioned range listed, please consider follow up with your Primary Care Provider.   You have been scheduled for a colonoscopy. Please follow written instructions given to you at your visit today.  Please pick up your prep supplies at the pharmacy within the next 1-3 days. If you use inhalers (even only as needed), please bring them with you on the day of your procedure.  You will be contacted by our office prior to your procedure for directions on holding your Plavix.  If you do not hear from our office 1 week prior to your scheduled procedure, please call [phone] to discuss.   It was a pleasure to see you today!  Dr. Loletha Carrow

## 2019-11-24 ENCOUNTER — Telehealth: Payer: Self-pay

## 2019-11-24 NOTE — Telephone Encounter (Signed)
Dr. Silvio Pate,   We have Tony Jackson scheduled for a colonoscopy on Nov. 8, 2021  Please advise if he can hold his Plavix for 5 days prior to procedure as requested by Dr. Loletha Carrow  Thank you in advance,    Vivien Rota, CMA

## 2019-11-24 NOTE — Telephone Encounter (Signed)
Yes---it is okay to hold his plavix for 5 days before his colonoscopy.

## 2019-11-25 NOTE — Telephone Encounter (Signed)
Patient has been notified and aware.  

## 2019-12-18 DIAGNOSIS — R69 Illness, unspecified: Secondary | ICD-10-CM | POA: Diagnosis not present

## 2019-12-29 ENCOUNTER — Encounter: Payer: Self-pay | Admitting: Gastroenterology

## 2019-12-29 ENCOUNTER — Ambulatory Visit (AMBULATORY_SURGERY_CENTER): Payer: Medicare HMO | Admitting: Gastroenterology

## 2019-12-29 ENCOUNTER — Other Ambulatory Visit: Payer: Self-pay

## 2019-12-29 VITALS — BP 145/76 | HR 66 | Temp 96.9°F | Resp 19 | Ht 68.0 in | Wt 163.0 lb

## 2019-12-29 DIAGNOSIS — D122 Benign neoplasm of ascending colon: Secondary | ICD-10-CM

## 2019-12-29 DIAGNOSIS — Z1211 Encounter for screening for malignant neoplasm of colon: Secondary | ICD-10-CM | POA: Diagnosis not present

## 2019-12-29 DIAGNOSIS — Z86010 Personal history of colonic polyps: Secondary | ICD-10-CM

## 2019-12-29 DIAGNOSIS — D123 Benign neoplasm of transverse colon: Secondary | ICD-10-CM | POA: Diagnosis not present

## 2019-12-29 MED ORDER — SODIUM CHLORIDE 0.9 % IV SOLN: 500.0000 mL | Freq: Once | INTRAVENOUS | Status: DC

## 2019-12-29 NOTE — Progress Notes (Signed)
Called to room to assist during endoscopic procedure.  Patient ID and intended procedure confirmed with present staff. Received instructions for my participation in the procedure from the performing physician.  

## 2019-12-29 NOTE — Progress Notes (Signed)
Pt's states no medical or surgical changes since previsit or office visit. 

## 2019-12-29 NOTE — Progress Notes (Signed)
To PACU, VSS. Report to [email or web address] 

## 2019-12-29 NOTE — Patient Instructions (Signed)
Impression/Recommendations:  Polyp handout given to patient.  Resume previous diet.  Resume Plavix (clopidogrel) in 5 days at prior dose.  (Resume on Saturday Nov. 13, 2021).  Await pathology results.  Repeat colonoscopy in 6 months for surveillance.  YOU HAD AN ENDOSCOPIC PROCEDURE TODAY AT [facility]:   Refer to the procedure report that was given to you for any specific questions about what was found during the examination.  If the procedure report does not answer your questions, please call your gastroenterologist to clarify.  If you requested that your care partner not be given the details of your procedure findings, then the procedure report has been included in a sealed envelope for you to review at your convenience later.  YOU SHOULD EXPECT: Some feelings of bloating in the abdomen. Passage of more gas than usual.  Walking can help get rid of the air that was put into your GI tract during the procedure and reduce the bloating. If you had a lower endoscopy (such as a colonoscopy or flexible sigmoidoscopy) you may notice spotting of blood in your stool or on the toilet paper. If you underwent a bowel prep for your procedure, you may not have a normal bowel movement for a few days.  Please Note:  You might notice some irritation and congestion in your nose or some drainage.  This is from the oxygen used during your procedure.  There is no need for concern and it should clear up in a day or so.  SYMPTOMS TO REPORT IMMEDIATELY:   Following lower endoscopy (colonoscopy or flexible sigmoidoscopy):  Excessive amounts of blood in the stool  Significant tenderness or worsening of abdominal pains  Swelling of the abdomen that is new, acute  Fever of 100F or higher For urgent or emergent issues, a gastroenterologist can be reached at any hour by calling [phone]. Do not use MyChart messaging for urgent concerns.    DIET:  We do recommend a small meal at first, but  then you may proceed to your regular diet.  Drink plenty of fluids but you should avoid alcoholic beverages for 24 hours.  ACTIVITY:  You should plan to take it easy for the rest of today and you should NOT DRIVE or use heavy machinery until tomorrow (because of the sedation medicines used during the test).    FOLLOW UP: Our staff will call the number listed on your records 48-72 hours following your procedure to check on you and address any questions or concerns that you may have regarding the information given to you following your procedure. If we do not reach you, we will leave a message.  We will attempt to reach you two times.  During this call, we will ask if you have developed any symptoms of COVID 19. If you develop any symptoms (ie: fever, flu-like symptoms, shortness of breath, cough etc.) before then, please call [phone].  If you test positive for Covid 19 in the 2 weeks post procedure, please call and report this information to [place].    If any biopsies were taken you will be contacted by phone or by letter within the next 1-3 weeks.  Please call us at [phone] if you have not heard about the biopsies in 3 weeks.    SIGNATURES/CONFIDENTIALITY: You and/or your care partner have signed paperwork which will be entered into your electronic medical record.  These signatures attest to the fact that that the information above on your After Visit Summary has  been reviewed and is understood.  Full responsibility of the confidentiality of this discharge information lies with you and/or your care-partner.

## 2019-12-29 NOTE — Op Note (Signed)
[facility] Patient Name: Merville Hijazi Procedure Date: 12/29/2019 3:44 PM MRN: [id number] Endoscopist: Smyer. Loletha Carrow , MD Age: 79 Referring MD:  Date of Birth: [birth year] Gender: Male Account #: [id number] Procedure:                Colonoscopy Indications:              Surveillance: Piecemeal removal of large sessile                            adenoma last colonoscopy (< 3 yrs) - multiple                            polyps including > 87mm TA removed piecemeal with                            EMR May 2019, advised to have repeat colonoscopy in                            6 months) Medicines:                Monitored Anesthesia Care Procedure:                Pre-Anesthesia Assessment:                           - Prior to the procedure, a History and Physical                            was performed, and patient medications and                            allergies were reviewed. The patient's tolerance of                            previous anesthesia was also reviewed. The risks                            and benefits of the procedure and the sedation                            options and risks were discussed with the patient.                            All questions were answered, and informed consent                            was obtained. Prior Anticoagulants: The patient has                            taken Plavix (clopidogrel), last dose was 5 days                            prior to procedure. ASA Grade Assessment: III - A  patient with severe systemic disease. After                            reviewing the risks and benefits, the patient was                            deemed in satisfactory condition to undergo the                            procedure.                           After obtaining informed consent, the colonoscope                            was passed under direct vision. Throughout the                            procedure, the  patient's blood pressure, pulse, and                            oxygen saturations were monitored continuously. The                            Colonoscope was introduced through the anus and                            advanced to the the cecum, identified by                            appendiceal orifice and ileocecal valve. The                            colonoscopy was performed without difficulty. The                            patient tolerated the procedure well. The quality                            of the bowel preparation was good. The ileocecal                            valve, appendiceal orifice, and rectum were                            photographed. Scope In: 3:58:22 PM Scope Out: 4:37:45 PM Scope Withdrawal Time: 0 hours 37 minutes 3 seconds  Total Procedure Duration: 0 hours 39 minutes 23 seconds  Findings:                 The perianal and digital rectal examinations were                            normal.  Two sessile polyps were found in the ascending                            colon. The polyps were diminutive in size. These                            polyps were removed with a cold snare. Resection                            and retrieval were complete.                           A 30 mm polyp was found in the mid transverse                            colon. The polyp was multi-lobulated and sessile,                            partially draped over a fold. Preparations were                            made for mucosal resection. 10cc saline was                            injected to raise the lesion. Piecemeal hot and                            cold snare mucosal resection was performed.                            Resection and retrieval were complete after snare                            tip cautery of edges. The size and shape of                            poypectromy site along with fibrotic tissue                            precluded clip  placement. Complications:            No immediate complications. Estimated Blood Loss:     Estimated blood loss was minimal. Impression:               - Two diminutive polyps in the ascending colon,                            removed with a cold snare. Resected and retrieved.                           - One 30 mm polyp in the mid transverse colon,                            removed with mucosal  resection. Resected and                            retrieved.                           - Mucosal resection was performed. Resection and                            retrieval were complete. Recommendation:           - Patient has a contact number available for                            emergencies. The signs and symptoms of potential                            delayed complications were discussed with the                            patient. Return to normal activities tomorrow.                            Written discharge instructions were provided to the                            patient.                           - Resume previous diet.                           - Resume Plavix (clopidogrel) at prior dose in 5                            days.                           - Await pathology results.                           - Repeat colonoscopy in 6 months for surveillance. Ravan Schlemmer L. Loletha Carrow, MD 12/29/2019 4:50:21 PM This report has been signed electronically.

## 2019-12-31 ENCOUNTER — Telehealth: Payer: Self-pay | Admitting: *Deleted

## 2019-12-31 NOTE — Telephone Encounter (Signed)
  Follow up Call-  Call back number 12/29/2019 07/06/2017  Post procedure Call Back phone  # [phone]  Permission to leave phone message Yes Yes  Some recent data might be hidden     Patient questions:  Do you have a fever, pain , or abdominal swelling? No. Pain Score  0 *  Have you tolerated food without any problems? Yes.    Have you been able to return to your normal activities? Yes.    Do you have any questions about your discharge instructions: Diet   No. Medications  No. Follow up visit  No.  Do you have questions or concerns about your Care? No.  Actions: * If pain score is 4 or above: No action needed, pain <4.  1. Have you developed a fever since your procedure? no  2.   Have you had an respiratory symptoms (SOB or cough) since your procedure? no  3.   Have you tested positive for COVID 19 since your procedure no  4.   Have you had any family members/close contacts diagnosed with the COVID 19 since your procedure?  no   If yes to any of these questions please route to Joylene John, RN and Joella Prince, RN

## 2020-01-06 ENCOUNTER — Encounter: Payer: Self-pay | Admitting: Gastroenterology

## 2020-01-07 DIAGNOSIS — R350 Frequency of micturition: Secondary | ICD-10-CM | POA: Diagnosis not present

## 2020-01-07 DIAGNOSIS — R972 Elevated prostate specific antigen [PSA]: Secondary | ICD-10-CM | POA: Diagnosis not present

## 2020-01-07 DIAGNOSIS — N401 Enlarged prostate with lower urinary tract symptoms: Secondary | ICD-10-CM | POA: Diagnosis not present

## 2020-01-20 DIAGNOSIS — R69 Illness, unspecified: Secondary | ICD-10-CM | POA: Diagnosis not present

## 2020-02-10 ENCOUNTER — Other Ambulatory Visit: Payer: Self-pay | Admitting: Internal Medicine

## 2020-03-02 ENCOUNTER — Encounter: Payer: Self-pay | Admitting: Internal Medicine

## 2020-03-02 ENCOUNTER — Ambulatory Visit (INDEPENDENT_AMBULATORY_CARE_PROVIDER_SITE_OTHER): Payer: Medicare HMO | Admitting: Internal Medicine

## 2020-03-02 ENCOUNTER — Other Ambulatory Visit: Payer: Self-pay

## 2020-03-02 VITALS — BP 134/76 | HR 78 | Temp 98.2°F | Ht 68.0 in | Wt 166.0 lb

## 2020-03-02 DIAGNOSIS — I10 Essential (primary) hypertension: Secondary | ICD-10-CM

## 2020-03-02 DIAGNOSIS — E1121 Type 2 diabetes mellitus with diabetic nephropathy: Secondary | ICD-10-CM | POA: Diagnosis not present

## 2020-03-02 DIAGNOSIS — N1832 Chronic kidney disease, stage 3b: Secondary | ICD-10-CM | POA: Diagnosis not present

## 2020-03-02 DIAGNOSIS — Z23 Encounter for immunization: Secondary | ICD-10-CM | POA: Diagnosis not present

## 2020-03-02 DIAGNOSIS — I471 Supraventricular tachycardia: Secondary | ICD-10-CM | POA: Diagnosis not present

## 2020-03-02 LAB — POCT GLYCOSYLATED HEMOGLOBIN (HGB A1C): Hemoglobin A1C: 6.3 % — AB (ref 4.0–5.6)

## 2020-03-02 NOTE — Progress Notes (Signed)
Subjective:    Patient ID: Tony Jackson., male    DOB: 18-Jul-1940, 80 y.o.   MRN: [id number]  HPI Here for follow up of diabetes and other chronic health conditions This visit occurred during the SARS-CoV-2 public health emergency.  Safety protocols were in place, including screening questions prior to the visit, additional usage of staff PPE, and extensive cleaning of exam room while observing appropriate contact time as indicated for disinfecting solutions.   Doing okay Has been checking sugar---101 this morning This is typical---always under 120 fasting No hypoglycemic reactions--will know he has to eat but not feel bad Some neuropathy pain---mostly in left foot (mild in right) Last GFR 41  No chest pain No SOB No palpitations No edema No dizziness or syncope  Current Outpatient Medications on File Prior to Visit  Medication Sig Dispense Refill  . amLODipine (NORVASC) 10 MG tablet TAKE 1 TABLET BY MOUTH EVERY DAY 90 tablet 3  . aspirin EC 81 MG tablet Take 81 mg by mouth daily.    Marland Kitchen atorvastatin (LIPITOR) 80 MG tablet Take 1 tablet (80 mg total) by mouth daily. 90 tablet 3  . Cholecalciferol (VITAMIN D3) 50 MCG (2000 UT) TABS Take 4,000 Units by mouth daily.    . clopidogrel (PLAVIX) 75 MG tablet Take 1 tablet (75 mg total) by mouth daily. 90 tablet 3  . gabapentin (NEURONTIN) 100 MG capsule Take by mouth.    Marland Kitchen glipiZIDE (GLUCOTROL XL) 5 MG 24 hr tablet TAKE 1 TABLET BY MOUTH EVERY DAY WITH BREAKFAST 90 tablet 3  . hydrocortisone 2.5 % cream APPLY TOPICALLY 3 TIMES DAILY AS NEEDED. 28.35 g 3  . ketoconazole (NIZORAL) 2 % shampoo APPLY 2-3 TIMES A WEEK--LEAVE NO FOR 5 MINUTES 120 mL 5  . lisinopril (ZESTRIL) 40 MG tablet TAKE 1 TABLET BY MOUTH EVERY DAY 90 tablet 3  . metFORMIN (GLUCOPHAGE) 1000 MG tablet TAKE 1 TABLET BY MOUTH TWICE A DAY WITH MEALS 180 tablet 3  . mometasone (ELOCON) 0.1 % cream APPLY 1 APPLICATION TOPICALLY 2 (TWO) TIMES DAILY AS NEEDED (SKIN). (Patient  taking differently: Apply 1 application topically 2 (two) times daily as needed (psoriasis).) 45 g 1  . ONETOUCH DELICA LANCETS FINE MISC Use 1 daily to obtain blood sample Dx Code E11.21 100 each 3  . ONETOUCH VERIO test strip USE 1 STRIP DAILY TO CHECK BLOOD SUGAR DX CODE E11.21 100 strip 3   No current facility-administered medications on file prior to visit.    Allergies  Allergen Reactions  . Poison Ivy Extract Rash    Past Medical History:  Diagnosis Date  . Cataract    in both eyes    had cataracts surgically removed  . Complication of anesthesia    reports during prostate bx year ago , his BP "dropped real low" and he had to stay overnight after what was suposed to be an amulatory surgery   . CVA (cerebral infarction)   . Diabetes mellitus   . ED (erectile dysfunction)   . Elevated PSA   . Frequency of urination   . GERD (gastroesophageal reflux disease)   . History of nephrolithiasis   . Hyperlipidemia   . Hypertension   . Lung nodule   . Psoriasis   . PSVT (paroxysmal supraventricular tachycardia) ([place])   . Stroke ([place])    2015  . Stroke ([place]) 08/2017   reports no lasting deficits since stroke, endorses that he feels back to regular self since before stroke  Past Surgical History:  Procedure Laterality Date  . CATARACT EXTRACTION W/ INTRAOCULAR LENS IMPLANT Right 03/30/12   Dr Kathrin Penner  . COLONOSCOPY     most recent 2019  . 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 NM MYOVIEW LTD     normal EF 56% 03/08  . PARATHYROIDECTOMY Left 02/28/2018   Procedure: LEFT INFERIOR PARATHYROIDECTOMY;  Surgeon: Armandina Gemma, MD;  Location: [facility];  Service: General;  Laterality: Left;  . POLYPECTOMY    . PROSTATE BIOPSY  01/05/2012   Procedure: BIOPSY TRANSRECTAL ULTRASONIC PROSTATE (TUBP);  Surgeon: [name], MD;  Location: [facility];  Service: Urology;  Laterality: N/A;  . PROSTATE BIOPSY N/A 10/14/2018   Procedure: BIOPSY  TRANSRECTAL ULTRASONIC PROSTATE (TUBP);  Surgeon: Raynelle Bring, MD;  Location: [facility];  Service: Urology;  Laterality: N/A;  . TEE WITHOUT CARDIOVERSION N/A 05/21/2014   Procedure: TRANSESOPHAGEAL ECHOCARDIOGRAM (TEE);  Surgeon: Jerline Pain, MD;  [facility];  Service: Cardiovascular;  Laterality: N/A;  . TONSILLECTOMY  1960    Family History  Problem Relation Age of Onset  . Diabetes Mother   . Breast cancer Mother   . Colon polyps Brother   . Heart disease Neg Hx   . Colon cancer Neg Hx   . Esophageal cancer Neg Hx   . Rectal cancer Neg Hx   . Stomach cancer Neg Hx     Social History   Socioeconomic History  . Marital status: Married    Spouse name: Peter Congo  . Number of children: 2  . Years of education: HS  . Highest education level: Not on file  Occupational History  . Occupation: Architect- paving    Comment: Retired  Tobacco Use  . Smoking status: Former Smoker    Packs/day: 1.00    Years: 5.00    Pack years: 5.00    Types: Cigarettes  . Smokeless tobacco: Current User    Types: Chew  . Tobacco comment: QUIT SMOKING CIGARETTES 50 YRS AGO--  CHEWED TOBACCO FOR 40 YRS  Vaping Use  . Vaping Use: Never used  Substance and Sexual Activity  . Alcohol use: Not Currently    Alcohol/week: 0.0 standard drinks  . Drug use: No  . Sexual activity: Not Currently  Other Topics Concern  . Not on file  Social History Narrative   Has living will   Requests wife as health care POA. Children would be alternate   Would accept resuscitation attempts   Not sure about tube feeds      Lives at home with his wife   Right handed    Caffeine: occasional    Social Determinants of Health   Financial Resource Strain: Not on file  Food Insecurity: Not on file  Transportation Needs: Not on file  Physical Activity: Not on file  Stress: Not on file  Social Connections: Not on file  Intimate Partner Violence: Not on file   Review of Systems Appetite is  good Weight stable Sleeps well    Objective:   Physical Exam Constitutional:      Appearance: Normal appearance.  Cardiovascular:     Rate and Rhythm: Normal rate and regular rhythm.     Pulses: Normal pulses.     Heart sounds: No murmur heard. No gallop.   Pulmonary:     Effort: Pulmonary effort is normal.     Breath sounds: Normal breath sounds. No wheezing or rales.  Musculoskeletal:  Cervical back: Neck supple.     Right lower leg: No edema.     Left lower leg: No edema.  Lymphadenopathy:     Cervical: No cervical adenopathy.  Skin:    Comments: No foot lesions  Neurological:     Mental Status: He is alert.  Psychiatric:        Mood and Affect: Mood normal.        Behavior: Behavior normal.            Assessment & Plan:

## 2020-03-02 NOTE — Assessment & Plan Note (Signed)
BP Readings from Last 3 Encounters:  03/02/20 134/76  12/29/19 (!) 145/76  11/21/19 128/62   Okay on amlodipine and lisinopril

## 2020-03-02 NOTE — Assessment & Plan Note (Addendum)
Still seems to have good control on glipizide and metformin Early neuropathy and has nephropathy as well  Lab Results  Component Value Date   HGBA1C 6.3 (A) 03/02/2020   Good control still No changes needed

## 2020-03-02 NOTE — Assessment & Plan Note (Signed)
Is on lisinopril Will recheck labs next time

## 2020-03-02 NOTE — Assessment & Plan Note (Signed)
No recurrence per symptoms

## 2020-03-29 ENCOUNTER — Other Ambulatory Visit: Payer: Self-pay | Admitting: Internal Medicine

## 2020-03-30 ENCOUNTER — Other Ambulatory Visit: Payer: Self-pay | Admitting: Internal Medicine

## 2020-04-19 ENCOUNTER — Other Ambulatory Visit: Payer: Self-pay | Admitting: Internal Medicine

## 2020-04-23 DIAGNOSIS — Z1159 Encounter for screening for other viral diseases: Secondary | ICD-10-CM | POA: Diagnosis not present

## 2020-04-25 ENCOUNTER — Other Ambulatory Visit: Payer: Self-pay | Admitting: Oncology

## 2020-04-25 ENCOUNTER — Encounter: Payer: Self-pay | Admitting: Oncology

## 2020-04-25 DIAGNOSIS — U071 COVID-19: Secondary | ICD-10-CM

## 2020-04-25 NOTE — Progress Notes (Signed)
I connected by phone with Tony Jackson. on 04/25/2020 at 11:29 AM to discuss the potential use of a new treatment for mild to moderate COVID-19 viral infection in non-hospitalized patients.  This patient is a 80 y.o. male that meets the FDA criteria for Emergency Use Authorization of COVID monoclonal antibody sotrovimab.  Has a (+) direct SARS-CoV-2 viral test result  Has mild or moderate COVID-19   Is NOT hospitalized due to COVID-19  Is within 10 days of symptom onset  Has at least one of the high risk factor(s) for progression to severe COVID-19 and/or hospitalization as defined in EUA.  Specific high risk criteria : Older age (>/= 80 yo)   I have spoken and communicated the following to the patient or parent/caregiver regarding COVID monoclonal antibody treatment:  1. FDA has authorized the emergency use for the treatment of mild to moderate COVID-19 in adults and pediatric patients with positive results of direct SARS-CoV-2 viral testing who are 30 years of age and older weighing at least 40 kg, and who are at high risk for progressing to severe COVID-19 and/or hospitalization.  2. The significant known and potential risks and benefits of COVID monoclonal antibody, and the extent to which such potential risks and benefits are unknown.  3. Information on available alternative treatments and the risks and benefits of those alternatives, including clinical trials.  4. Patients treated with COVID monoclonal antibody should continue to self-isolate and use infection control measures (e.g., wear mask, isolate, social distance, avoid sharing personal items, clean and disinfect "high touch" surfaces, and frequent handwashing) according to CDC guidelines.   5. The patient or parent/caregiver has the option to accept or refuse COVID monoclonal antibody treatment.  After reviewing this information with the patient, the patient has agreed to receive one of the available covid 19 monoclonal  antibodies and will be provided an appropriate fact sheet prior to infusion. [name], NP 04/25/2020 11:29 AM

## 2020-04-26 ENCOUNTER — Ambulatory Visit (HOSPITAL_COMMUNITY)
Admission: RE | Admit: 2020-04-26 | Discharge: 2020-04-26 | Disposition: A | Discharge status: Home / Self Care | Payer: Medicare HMO | Source: Ambulatory Visit | Attending: Pulmonary Disease | Admitting: Pulmonary Disease

## 2020-04-26 DIAGNOSIS — U071 COVID-19: Secondary | ICD-10-CM | POA: Insufficient documentation

## 2020-04-26 MED ORDER — EPINEPHRINE 0.3 MG/0.3ML IJ SOAJ: 0.3000 mg | Freq: Once | INTRAMUSCULAR | Status: DC | PRN

## 2020-04-26 MED ORDER — ALBUTEROL SULFATE HFA 108 (90 BASE) MCG/ACT IN AERS: 2.0000 | INHALATION_SPRAY | Freq: Once | RESPIRATORY_TRACT | Status: DC | PRN

## 2020-04-26 MED ORDER — METHYLPREDNISOLONE SODIUM SUCC 125 MG IJ SOLR: 125.0000 mg | Freq: Once | INTRAMUSCULAR | Status: DC | PRN

## 2020-04-26 MED ORDER — SODIUM CHLORIDE 0.9 % IV SOLN: INTRAVENOUS | Status: DC | PRN

## 2020-04-26 MED ORDER — FAMOTIDINE IN NACL 20-0.9 MG/50ML-% IV SOLN: 20.0000 mg | Freq: Once | INTRAVENOUS | Status: DC | PRN

## 2020-04-26 MED ORDER — SOTROVIMAB 500 MG/8ML IV SOLN
500.0000 mg | Freq: Once | INTRAVENOUS | Status: AC
  Administered 2020-04-26: 500 mg via INTRAVENOUS

## 2020-04-26 MED ORDER — DIPHENHYDRAMINE HCL 50 MG/ML IJ SOLN: 50.0000 mg | Freq: Once | INTRAMUSCULAR | Status: DC | PRN

## 2020-04-26 NOTE — Discharge Instructions (Signed)

## 2020-04-26 NOTE — Progress Notes (Signed)
Patient reviewed Fact Sheet for Patients, Parents, and Caregivers for Emergency Use Authorization (EUA) of sotrovimab for the Treatment of Coronavirus. Patient also reviewed and is agreeable to the estimated cost of treatment. Patient is agreeable to proceed.   

## 2020-04-26 NOTE — Progress Notes (Signed)
Diagnosis: COVID-19  Physician: Dr. Patrick Wright  Procedure: [facility] Med: Sotrovimab infusion - Provided patient with sotrovimab fact sheet for patients, parents, and caregivers prior to infusion.   Complications: No immediate complications noted  Discharge: Discharged home    

## 2020-05-05 DIAGNOSIS — E1151 Type 2 diabetes mellitus with diabetic peripheral angiopathy without gangrene: Secondary | ICD-10-CM | POA: Diagnosis not present

## 2020-05-05 DIAGNOSIS — E1159 Type 2 diabetes mellitus with other circulatory complications: Secondary | ICD-10-CM | POA: Diagnosis not present

## 2020-05-05 DIAGNOSIS — E1122 Type 2 diabetes mellitus with diabetic chronic kidney disease: Secondary | ICD-10-CM | POA: Diagnosis not present

## 2020-05-05 DIAGNOSIS — M2550 Pain in unspecified joint: Secondary | ICD-10-CM | POA: Diagnosis not present

## 2020-05-05 DIAGNOSIS — E785 Hyperlipidemia, unspecified: Secondary | ICD-10-CM | POA: Diagnosis not present

## 2020-05-05 DIAGNOSIS — G8929 Other chronic pain: Secondary | ICD-10-CM | POA: Diagnosis not present

## 2020-05-05 DIAGNOSIS — I2510 Atherosclerotic heart disease of native coronary artery without angina pectoris: Secondary | ICD-10-CM | POA: Diagnosis not present

## 2020-05-05 DIAGNOSIS — K219 Gastro-esophageal reflux disease without esophagitis: Secondary | ICD-10-CM | POA: Diagnosis not present

## 2020-05-05 DIAGNOSIS — N189 Chronic kidney disease, unspecified: Secondary | ICD-10-CM | POA: Diagnosis not present

## 2020-05-05 DIAGNOSIS — I4891 Unspecified atrial fibrillation: Secondary | ICD-10-CM | POA: Diagnosis not present

## 2020-05-05 DIAGNOSIS — I129 Hypertensive chronic kidney disease with stage 1 through stage 4 chronic kidney disease, or unspecified chronic kidney disease: Secondary | ICD-10-CM | POA: Diagnosis not present

## 2020-05-05 DIAGNOSIS — Z008 Encounter for other general examination: Secondary | ICD-10-CM | POA: Diagnosis not present

## 2020-05-05 DIAGNOSIS — E1142 Type 2 diabetes mellitus with diabetic polyneuropathy: Secondary | ICD-10-CM | POA: Diagnosis not present

## 2020-05-14 ENCOUNTER — Encounter: Payer: Self-pay | Admitting: Gastroenterology

## 2020-06-01 DIAGNOSIS — E113292 Type 2 diabetes mellitus with mild nonproliferative diabetic retinopathy without macular edema, left eye: Secondary | ICD-10-CM | POA: Diagnosis not present

## 2020-06-01 DIAGNOSIS — H43813 Vitreous degeneration, bilateral: Secondary | ICD-10-CM | POA: Diagnosis not present

## 2020-06-01 DIAGNOSIS — H0100A Unspecified blepharitis right eye, upper and lower eyelids: Secondary | ICD-10-CM | POA: Diagnosis not present

## 2020-06-01 DIAGNOSIS — Z961 Presence of intraocular lens: Secondary | ICD-10-CM | POA: Diagnosis not present

## 2020-06-01 LAB — HM DIABETES EYE EXAM

## 2020-06-30 ENCOUNTER — Telehealth: Payer: Self-pay

## 2020-06-30 NOTE — Telephone Encounter (Signed)
Mrs Hixon (DPR signed) said she wanted Dr Silvio Pate to know starting last night pt said he was incapacitated and could not move in bed last night; pt continues with lower lt back pain with no radiation of pain to leg with pain level of 9 - 10 with the pain level increasing when tries to move. Pt has not fallen but did do heavy lifting on 06/28/20 and 06/29/20. pts wife is concerned that he has ruptured a disc and pt is asking wife to call 911 and take him to ED. Mrs Grunewald said she is going to call 911 now.FYI to Dr Silvio Pate.

## 2020-06-30 NOTE — Telephone Encounter (Signed)
Left message on VM for wife to call back.

## 2020-06-30 NOTE — Telephone Encounter (Signed)
Spoke to pt's wife. She never called EMS because he wanted to wait. But asking if a muscle relaxer or pain medication could be called in without an OV.

## 2020-06-30 NOTE — Telephone Encounter (Signed)
I would not treat him if he can't even get out of bed due to the pain. If he is better, and doesn't want ER evaluation---but still in pain, he should try tylenol and you can send Rx for tizanidine 2mg -- 1-2 tid prn for muscle spasm (#20 x 0) He needs the ER if numbness in groin/perineum, leg weakness or ongoing severe pain

## 2020-06-30 NOTE — Telephone Encounter (Signed)
Patients wife is asking for a muscle relaxer for the patient as the pain is so bad. Patient would like to discuss. EM

## 2020-07-01 NOTE — Telephone Encounter (Signed)
Spoke to pt's wife. He is more mobile today. Taking Tylenol regularly. Using cane and a back brace. Will call if he ends up needing the tizanidine.

## 2020-07-07 ENCOUNTER — Ambulatory Visit: Payer: Medicare HMO | Admitting: Gastroenterology

## 2020-07-07 ENCOUNTER — Telehealth: Payer: Self-pay

## 2020-07-07 ENCOUNTER — Encounter: Payer: Self-pay | Admitting: Gastroenterology

## 2020-07-07 VITALS — BP 150/76 | HR 75 | Ht 68.0 in | Wt 162.4 lb

## 2020-07-07 DIAGNOSIS — Z86010 Personal history of colonic polyps: Secondary | ICD-10-CM | POA: Diagnosis not present

## 2020-07-07 DIAGNOSIS — M5459 Other low back pain: Secondary | ICD-10-CM

## 2020-07-07 DIAGNOSIS — Z7902 Long term (current) use of antithrombotics/antiplatelets: Secondary | ICD-10-CM

## 2020-07-07 NOTE — Progress Notes (Signed)
[facility] Progress Note  Chief Complaint: History of colon polyp (large polyps removed by EMR)  Subjective  History: Indication for 12/29/2019 colonoscopy: Surveillance: Piecemeal removal of large sessile adenoma last colonoscopy (< 3 yrs) - multiple polyps including > 65mm TA removed piecemeal with EMR May 2019, advised to have repeat colonoscopy in 6 months)  On that exam, patient had 2 small right colon polyps as well as an approximately 30 mm transverse colon polyp, sessile and draped over a fold.  EMR performed, pathology all adenomas.  Recommendation for repeat colonoscopy in 6 months.  Tony Jackson is here with his wife.  Overall he has been doing well with his health. He was COVID positive 2 months ago and got monoclonal Ab, but says he did not get very sick.  There have been some lower back pain problems lately.  His appetite is good and his weight stable.  He denies dysphagia, odynophagia, altered bowel habits or rectal bleeding.  ROS: Cardiovascular:  no chest pain Respiratory: no dyspnea  The patient's Past Medical, Family and Social History were reviewed and are on file in the EMR. Past Medical History:  Diagnosis Date  . Cataract    in both eyes    had cataracts surgically removed  . Complication of anesthesia    reports during prostate bx year ago , his BP "dropped real low" and he had to stay overnight after what was suposed to be an amulatory surgery   . CVA (cerebral infarction)   . Diabetes mellitus   . ED (erectile dysfunction)   . Elevated PSA   . Frequency of urination   . GERD (gastroesophageal reflux disease)   . History of nephrolithiasis   . Hyperlipidemia   . Hypertension   . Lung nodule   . Psoriasis   . PSVT (paroxysmal supraventricular tachycardia) ([place])   . Stroke ([place])    2015  . Stroke ([place]) 08/2017   reports no lasting deficits since stroke, endorses that he feels back to regular self since before stroke    Past Surgical History:   Procedure Laterality Date  . CATARACT EXTRACTION W/ INTRAOCULAR LENS IMPLANT Right 03/30/12   Dr Kathrin Penner  . COLONOSCOPY     most recent 2019  . 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 NM MYOVIEW LTD     normal EF 56% 03/08  . PARATHYROIDECTOMY Left 02/28/2018   Procedure: LEFT INFERIOR PARATHYROIDECTOMY;  Surgeon: Armandina Gemma, MD;  Location: [facility];  Service: General;  Laterality: Left;  . POLYPECTOMY    . PROSTATE BIOPSY  01/05/2012   Procedure: BIOPSY TRANSRECTAL ULTRASONIC PROSTATE (TUBP);  Surgeon: [name], MD;  [facility];  Service: Urology;  Laterality: N/A;  . PROSTATE BIOPSY N/A 10/14/2018   Procedure: BIOPSY TRANSRECTAL ULTRASONIC PROSTATE (TUBP);  Surgeon: Raynelle Bring, MD;  Location: [facility];  Service: Urology;  Laterality: N/A;  . TEE WITHOUT CARDIOVERSION N/A 05/21/2014   Procedure: TRANSESOPHAGEAL ECHOCARDIOGRAM (TEE);  Surgeon: Jerline Pain, MD;  Location: [facility];  Service: Cardiovascular;  Laterality: N/A;  . TONSILLECTOMY  1960    Objective:  Med list reviewed  Current Outpatient Medications:  .  amLODipine (NORVASC) 10 MG tablet, TAKE 1 TABLET BY MOUTH EVERY DAY, Disp: 90 tablet, Rfl: 3 .  aspirin EC 81 MG tablet, Take 81 mg by mouth daily., Disp: , Rfl:  .  atorvastatin (LIPITOR) 80 MG tablet, TAKE 1 TABLET BY MOUTH EVERY DAY,  Disp: 90 tablet, Rfl: 3 .  Cholecalciferol (VITAMIN D3) 50 MCG (2000 UT) TABS, Take 4,000 Units by mouth daily., Disp: , Rfl:  .  clopidogrel (PLAVIX) 75 MG tablet, TAKE 1 TABLET BY MOUTH EVERY DAY, Disp: 90 tablet, Rfl: 3 .  gabapentin (NEURONTIN) 100 MG capsule, Take by mouth., Disp: , Rfl:  .  glipiZIDE (GLUCOTROL XL) 5 MG 24 hr tablet, TAKE 1 TABLET BY MOUTH EVERY DAY WITH BREAKFAST, Disp: 90 tablet, Rfl: 3 .  hydrocortisone 2.5 % cream, APPLY TOPICALLY 3 TIMES DAILY AS NEEDED., Disp: 28.35 g, Rfl: 3 .  ketoconazole (NIZORAL) 2 % shampoo, APPLY 2-3 TIMES A WEEK--LEAVE NO  FOR 5 MINUTES, Disp: 120 mL, Rfl: 5 .  lisinopril (ZESTRIL) 40 MG tablet, TAKE 1 TABLET BY MOUTH EVERY DAY, Disp: 90 tablet, Rfl: 3 .  metFORMIN (GLUCOPHAGE) 1000 MG tablet, TAKE 1 TABLET BY MOUTH TWICE A DAY WITH MEALS, Disp: 180 tablet, Rfl: 3 .  mometasone (ELOCON) 0.1 % cream, APPLY 1 APPLICATION TOPICALLY 2 (TWO) TIMES DAILY AS NEEDED (SKIN). (Patient taking differently: Apply 1 application topically 2 (two) times daily as needed (psoriasis).), Disp: 45 g, Rfl: 1 .  ONETOUCH DELICA LANCETS FINE MISC, Use 1 daily to obtain blood sample Dx Code E11.21, Disp: 100 each, Rfl: 3 .  ONETOUCH VERIO test strip, USE 1 STRIP DAILY TO CHECK BLOOD SUGAR DX CODE E11.21, Disp: 100 strip, Rfl: 3   Vital signs in last 24 hrs: Vitals:   07/07/20 1529  BP: (!) 150/76  Pulse: 75   Wt Readings from Last 3 Encounters:  07/07/20 162 lb 6.4 oz (73.7 kg)  03/02/20 166 lb (75.3 kg)  12/29/19 163 lb (73.9 kg)    Physical Exam  Well-appearing, antalgic gait, gets on exam table without difficulty  Cardiac: RRR without murmurs, S1S2 heard, no peripheral edema  Pulm: clear to auscultation bilaterally, normal RR and effort noted  Abdomen: soft, no tenderness, with active bowel sounds. No guarding or palpable hepatosplenomegaly.   Labs:   ___________________________________________ Radiologic studies:   ____________________________________________ Other:   _____________________________________________ Assessment & Plan  Assessment: Encounter Diagnoses  Name Primary?  . Personal history of colonic polyps Yes  . Long term (current) use of antithrombotics/antiplatelets    History of recurrent advanced adenoma in transverse colon, EMR done in 2019 and again November 2021. History cerebrovascular disease, on Plavix about 10 years.  No ongoing neurologic symptoms or deficit.  I recommended a surveillance colonoscopy, and he was agreeable after discussion of procedure and risks.  The benefits and  risks of the planned procedure were described in detail with the patient or (when appropriate) their health care proxy.  Risks were outlined as including, but not limited to, bleeding, infection, perforation, adverse medication reaction leading to cardiac or pulmonary decompensation, pancreatitis (if ERCP).  The limitation of incomplete mucosal visualization was also discussed.  No guarantees or warranties were given.  Patient at increased risk for cardiopulmonary complications of procedure due to medical comorbidities. We will hold Plavix 5 days prior and check with his primary care on that.  They were agreeable to this in the past, and I expect there will be again.  I am hopeful that there will be low risk findings and perhaps this can be his last colonoscopy.   Nelida Meuse III

## 2020-07-07 NOTE — Telephone Encounter (Signed)
Dr. Silvio Pate,  Please advise on Plavix request.   Request for surgical clearance:     Endoscopy Procedure  What type of surgery is being performed?     Colonoscopy   When is this surgery scheduled?     09-07-2020  What type of clearance is required ?   Pharmacy  Are there any medications that need to be held prior to surgery and how long? Yes,Plavix   Practice name and name of physician performing surgery?      [facility]  What is your office phone and fax number?      Phone- [phone]  [phone]  Anesthesia type (None, local, MAC, general) ?       MAC

## 2020-07-07 NOTE — Patient Instructions (Addendum)
If you are age 80 or older, your body mass index should be between 23-30. Your Body mass index is 24.69 kg/m. If this is out of the aforementioned range listed, please consider follow up with your Primary Care Provider.  If you are age 58 or younger, your body mass index should be between 19-25. Your Body mass index is 24.69 kg/m. If this is out of the aformentioned range listed, please consider follow up with your Primary Care Provider.   You have been scheduled for a colonoscopy. Please follow written instructions given to you at your visit today.  Please pick up your prep supplies at the pharmacy within the next 1-3 days. If you use inhalers (even only as needed), please bring them with you on the day of your procedure.  It was a pleasure to see you today!  Thank you for trusting me with your gastrointestinal care!

## 2020-07-08 NOTE — Telephone Encounter (Signed)
Primary Cardiologist:None  Chart reviewed as part of pre-operative protocol coverage. Because of PHEONIX CLINKSCALE Jr.'s past medical history and time since last visit, he/she will require a follow-up visit in order to better assess preoperative cardiovascular risk.  Pre-op covering staff: - Please schedule appointment and call patient to inform them. - Please contact requesting surgeon's office via preferred method (i.e, phone, fax) to inform them of need for appointment prior to surgery.  If applicable, this message will also be routed to pharmacy pool and/or primary cardiologist for input on holding anticoagulant/antiplatelet agent as requested below so that this information is available at time of patient's appointment.   Deberah Pelton, NP  07/08/2020, 11:22 AM

## 2020-07-08 NOTE — Telephone Encounter (Signed)
Appt schedule with [name] on 06/26 @ 9:45 am

## 2020-07-08 NOTE — Telephone Encounter (Signed)
Noted. He is medically cleared by me if the cardiologist doesn't have any concerns.

## 2020-07-09 NOTE — Telephone Encounter (Signed)
Please review

## 2020-07-09 NOTE — Telephone Encounter (Signed)
Await cardiology visit Colonoscopy currently scheduled in July, so we have time  - HD

## 2020-07-12 NOTE — Telephone Encounter (Signed)
Cardiology visit is scheduled for 08/25/20. Colonoscopy scheduled for 09/07/20.

## 2020-08-18 ENCOUNTER — Ambulatory Visit: Payer: Medicare HMO | Admitting: Internal Medicine

## 2020-08-24 NOTE — Progress Notes (Signed)
Electrophysiology Office Note Date: 08/25/2020  ID:  Tony Mallek., DOB [birth year], MRN [id number]  PCP: [name], Tony Jackson Primary Cardiologist: None Electrophysiologist: Tony Grayer, Tony Jackson   CC: ILR follow-up  Tony Jackson. is a 80 y.o. male seen today for Tony Jackson . He presents today for Cardiac Clearance and to re-establish as last visit was 2017.    Since last being seen in our clinic, the patient reports doing very well.  he denies chest pain, PND, orthopnea, nausea, vomiting, dizziness, syncope, edema, weight gain, or early satiety.  He has an upcoming colonoscopy and a prostate biopsy due to elevated PSA. He denies any tachy-palpitations or undue SOB. Overall he is without cardiac complaint.   Device History: Medtronic loop recorder implanted 2016 for Cryptogenic Stroke  Past Medical History:  Diagnosis Date   Cataract    in both eyes    had cataracts surgically removed   Complication of anesthesia    reports during prostate bx year ago , his BP "dropped real low" and he had to stay overnight after what was suposed to be an amulatory surgery    CVA (cerebral infarction)    Diabetes mellitus    ED (erectile dysfunction)    Elevated PSA    Frequency of urination    GERD (gastroesophageal reflux disease)    History of nephrolithiasis    Hyperlipidemia    Hypertension    Lung nodule    Psoriasis    PSVT (paroxysmal supraventricular tachycardia) ([place])    Stroke ([place])    2015   Stroke ([place]) 08/2017   reports no lasting deficits since stroke, endorses that he feels back to regular self since before stroke    Past Surgical History:  Procedure Laterality Date   CATARACT EXTRACTION W/ INTRAOCULAR LENS IMPLANT Right 03/30/12   Tony Kathrin Penner   COLONOSCOPY     most recent 2019   ELECTROPHYSIOLOGIC STUDY N/A 09/24/2014   AVNRT pathway by Tony Jackson   NM MYOVIEW LTD     normal EF 56% 03/08   PARATHYROIDECTOMY Left 02/28/2018   Procedure: LEFT INFERIOR  PARATHYROIDECTOMY;  Surgeon: Armandina Gemma, Tony Jackson;  Location: [facility];  Service: General;  Laterality: Left;   POLYPECTOMY     PROSTATE BIOPSY  01/05/2012   Procedure: BIOPSY TRANSRECTAL ULTRASONIC PROSTATE (TUBP);  Surgeon: [name], Tony Jackson;  Location: [facility];  Service: Urology;  Laterality: N/A;   PROSTATE BIOPSY N/A 10/14/2018   Procedure: BIOPSY TRANSRECTAL ULTRASONIC PROSTATE (TUBP);  Surgeon: Raynelle Bring, Tony Jackson;  Location: [facility];  Service: Urology;  Laterality: N/A;   TEE WITHOUT CARDIOVERSION N/A 05/21/2014   Procedure: TRANSESOPHAGEAL ECHOCARDIOGRAM (TEE);  Surgeon: Jerline Pain, Tony Jackson;  Location: [facility] END[facility]COPY;  Service: Cardiovascular;  Laterality: N/A;   TONSILLECTOMY  1960    Current Outpatient Medications  Medication Sig Dispense Refill   amLODipine (NORVASC) 10 MG tablet TAKE 1 TABLET BY MOUTH EVERY DAY 90 tablet 3   aspirin EC 81 MG tablet Take 81 mg by mouth daily.     atorvastatin (LIPITOR) 80 MG tablet TAKE 1 TABLET BY MOUTH EVERY DAY 90 tablet 3   Cholecalciferol (VITAMIN D3) 50 MCG (2000 UT) TABS Take 4,000 Units by mouth daily.     clopidogrel (PLAVIX) 75 MG tablet TAKE 1 TABLET BY MOUTH EVERY DAY 90 tablet 3   gabapentin (NEURONTIN) 100 MG capsule Take by mouth.     glipiZIDE (GLUCOTROL XL) 5 MG 24 hr tablet TAKE 1  TABLET BY MOUTH EVERY DAY WITH BREAKFAST 90 tablet 3   hydrocortisone 2.5 % cream APPLY TOPICALLY 3 TIMES DAILY AS NEEDED. 28.35 g 3   ketoconazole (NIZORAL) 2 % shampoo APPLY 2-3 TIMES A WEEK--LEAVE NO FOR 5 MINUTES 120 mL 5   lisinopril (ZESTRIL) 40 MG tablet TAKE 1 TABLET BY MOUTH EVERY DAY 90 tablet 3   metFORMIN (GLUCOPHAGE) 1000 MG tablet TAKE 1 TABLET BY MOUTH TWICE A DAY WITH MEALS 180 tablet 3   mometasone (ELOCON) 0.1 % cream APPLY 1 APPLICATION TOPICALLY 2 (TWO) TIMES DAILY AS NEEDED (SKIN). 45 g 1   ONETOUCH DELICA LANCETS FINE MISC Use 1 daily to obtain blood sample Dx Code E11.21 100 each 3   ONETOUCH VERIO test strip USE 1 STRIP DAILY  TO CHECK BLOOD SUGAR DX CODE E11.21 100 strip 3   No current facility-administered medications for this visit.    Allergies:   Poison ivy extract   Social History: Social History   Socioeconomic History   Marital status: Married    Spouse name: Tony Jackson   Number of children: 2   Years of education: HS   Highest education level: Not on file  Occupational History   Occupation: Architect- paving    Comment: Retired  Tobacco Use   Smoking status: Former    Packs/day: 1.00    Years: 5.00    Pack years: 5.00    Types: Cigarettes   Smokeless tobacco: Current    Types: Chew   Tobacco comments:    QUIT SMOKING CIGARETTES 50 YRS AGO--  CHEWED TOBACCO FOR 40 YRS  Vaping Use   Vaping Use: Never used  Substance and Sexual Activity   Alcohol use: Not Currently    Alcohol/week: 0.0 standard drinks   Drug use: No   Sexual activity: Not Currently  Other Topics Concern   Not on file  Social History Narrative   Has living will   Requests wife as health care POA. Children would be alternate   Would accept resuscitation attempts   Not sure about tube feeds      Lives at home with his wife   Right handed    Caffeine: occasional    Social Determinants of Health   Financial Resource Strain: Not on file  Food Insecurity: Not on file  Transportation Needs: Not on file  Physical Activity: Not on file  Stress: Not on file  Social Connections: Not on file  Intimate Partner Violence: Not on file    Family History: Family History  Problem Relation Age of Onset   Diabetes Mother    Breast cancer Mother    Colon polyps Brother    Heart disease Neg Hx    Colon cancer Neg Hx    Esophageal cancer Neg Hx    Rectal cancer Neg Hx    Stomach cancer Neg Hx      Review of Systems: All other systems reviewed and are otherwise negative except as noted above.  Physical Exam: Vitals:   08/25/20 0915  BP: 128/74  Pulse: 64  SpO2: 98%  Weight: 162 lb (73.5 kg)  Height: _0   (1.727 m)     GEN- The patient is well appearing, alert and oriented x 3 today.   HEENT: normocephalic, atraumatic; sclera clear, conjunctiva pink; hearing intact; oropharynx clear; neck supple  Lungs- Clear to ausculation bilaterally, normal work of breathing.  No wheezes, rales, rhonchi Heart- Regular rate and rhythm, no murmurs, rubs or gallops  GI- soft, non-tender,  non-distended, bowel sounds present  Extremities- no clubbing, cyanosis, or edema  MS- no significant deformity or atrophy Skin- warm and dry, no rash or lesion; PPM pocket well healed Psych- euthymic mood, full affect Neuro- strength and sensation are intact  PPM Interrogation- reviewed in detail today,  See PACEART report  EKG:  EKG is ordered today. The ekg ordered today shows NSR at 64 bpm with QRS 72 ,s and 1st degree AV block at 272 ms  Recent Labs: 08/29/2019: ALT 29; BUN 35; Creatinine, Ser 1.60; Hemoglobin 12.4; Platelets 232.0; Potassium 4.6; Sodium 140   Wt Readings from Last 3 Encounters:  08/25/20 162 lb (73.5 kg)  07/07/20 162 lb 6.4 oz (73.7 kg)  03/02/20 166 lb (75.3 kg)     Other studies Reviewed: Additional studies/ records that were reviewed today include: Echo 08/2017 shows LVEF 60-65%, Previous EP office notes, Previous remote checks, Most recent labwork.   Assessment and Plan:  1. Cryptogenic Stroke s/p Medtronic Loop recorder (previously hit ERI) Normal device function See Claudia Desanctis Art report No changes today  2. H/o SVT s/p ablation 09/2014 Stable post ablation  3. HTN Stable on current medications  4. Cardiac Clearance For colonoscopy later this month.  EF normal 08/2017 No recurrent of his SVT s/p ablation He is clear to proceed for Colonoscopy without further cardiac work up with a relatively low risk of perioperative complications from a cardiac perspective by the Revised Cardiac Risk Index (lee criteria). Pending labwork.    Current medicines are reviewed at length with the  patient today.   The patient does not have concerns regarding his medicines.  The following changes were made today:  none  Labs/ tests ordered today include:  Orders Placed This Encounter  Procedures   Basic metabolic panel   CBC   EKG 12-Lead    Disposition:   Follow up with Tony Jackson or EPP APP in 1 year.  His ILR has met EOS for sometime, so can no longer follow remotely.   Jacalyn Lefevre, PA-C  08/25/2020 9:29 AM  [facility] [address] [place] [address] [phone] (office) [phone] (fax)

## 2020-08-25 ENCOUNTER — Ambulatory Visit: Payer: Medicare HMO | Admitting: Student

## 2020-08-25 ENCOUNTER — Other Ambulatory Visit: Payer: Self-pay

## 2020-08-25 ENCOUNTER — Encounter: Payer: Self-pay | Admitting: Student

## 2020-08-25 VITALS — BP 128/74 | HR 64 | Ht 68.0 in | Wt 162.0 lb

## 2020-08-25 DIAGNOSIS — I10 Essential (primary) hypertension: Secondary | ICD-10-CM | POA: Diagnosis not present

## 2020-08-25 DIAGNOSIS — I471 Supraventricular tachycardia: Secondary | ICD-10-CM | POA: Diagnosis not present

## 2020-08-25 DIAGNOSIS — Z01818 Encounter for other preprocedural examination: Secondary | ICD-10-CM | POA: Diagnosis not present

## 2020-08-25 DIAGNOSIS — Z8673 Personal history of transient ischemic attack (TIA), and cerebral infarction without residual deficits: Secondary | ICD-10-CM

## 2020-08-25 DIAGNOSIS — Z01810 Encounter for preprocedural cardiovascular examination: Secondary | ICD-10-CM

## 2020-08-25 LAB — BASIC METABOLIC PANEL
BUN/Creatinine Ratio: 16 (ref 10–24)
BUN: 23 mg/dL (ref 8–27)
CO2: 20 mmol/L (ref 20–29)
Calcium: 9.5 mg/dL (ref 8.6–10.2)
Chloride: 106 mmol/L (ref 96–106)
Creatinine, Ser: 1.45 mg/dL — ABNORMAL HIGH (ref 0.76–1.27)
Glucose: 134 mg/dL — ABNORMAL HIGH (ref 65–99)
Potassium: 5.1 mmol/L (ref 3.5–5.2)
Sodium: 141 mmol/L (ref 134–144)
eGFR: 49 mL/min/{1.73_m2} — ABNORMAL LOW (ref >59)

## 2020-08-25 LAB — CBC
Hematocrit: 39.8 % (ref 37.5–51.0)
Hemoglobin: 13.3 g/dL (ref 13.0–17.7)
MCH: 31 pg (ref 26.6–33.0)
MCHC: 33.4 g/dL (ref 31.5–35.7)
MCV: 93 fL (ref 79–97)
Platelets: 243 10*3/uL (ref 150–450)
RBC: 4.29 x10E6/uL (ref 4.14–5.80)
RDW: 12.5 % (ref 11.6–15.4)
WBC: 6.8 10*3/uL (ref 3.4–10.8)

## 2020-08-25 NOTE — Patient Instructions (Signed)
Medication Instructions:  Your physician recommends that you continue on your current medications as directed. Please refer to the Current Medication list given to you today.  *If you need a refill on your cardiac medications before your next appointment, please call your pharmacy*   Lab Work: TODAY: BMET, CBC  If you have labs (blood work) drawn today and your tests are completely normal, you will receive your results only by: [place] (if you have MyChart) OR A paper copy in the mail If you have any lab test that is abnormal or we need to change your treatment, we will call you to review the results.   Follow-Up: At [facility], you and your health needs are our priority.  As part of our continuing mission to provide you with exceptional heart care, we have created designated Provider Care Teams.  These Care Teams include your primary Cardiologist (physician) and Advanced Practice Providers (APPs -  Physician Assistants and Nurse Practitioners) who all work together to provide you with the care you need, when you need it.   Your next appointment:   1 year(s)  The format for your next appointment:   In Person  Provider:   You may see [name], MD or one of the following Advanced Practice Providers on your designated Care Team:   Legrand Como "Jonni Sanger" [place], [place]

## 2020-08-25 NOTE — Telephone Encounter (Signed)
[name],  Thank you for seeing this patient today for cardiology evaluation. I reviewed your note and I am glad to hear that we can proceed with colonoscopy without further cardiac testing.  Are you agreeable to having this patient hold his Plavix 5 days prior to the colonoscopy?  - Wilfrid Lund, MD [facility]

## 2020-08-26 ENCOUNTER — Other Ambulatory Visit: Payer: Self-pay

## 2020-08-26 DIAGNOSIS — Z86010 Personal history of colonic polyps: Secondary | ICD-10-CM

## 2020-08-26 MED ORDER — PLENVU 140 G PO SOLR: 1.0000 | ORAL | 0 refills | Status: DC

## 2020-08-26 NOTE — Telephone Encounter (Signed)
Okay to hold plavix for 5 days prior to the procedure.  Restart as soon as possible after

## 2020-08-26 NOTE — Telephone Encounter (Signed)
Called pt to inform about Plavix instructions below. Verbalized acceptance and understanding. Pt also mentioned that his prep Rx had not been sent to his pharmacy. Rx sent today. Advised to call if has any issues with regards to cost, etc. Verbalized appreciation and understanding.

## 2020-08-26 NOTE — Telephone Encounter (Signed)
Please have this patient hold his plavix 5 days prior to the colonoscopy.  - HD

## 2020-08-31 ENCOUNTER — Ambulatory Visit (INDEPENDENT_AMBULATORY_CARE_PROVIDER_SITE_OTHER): Payer: Medicare HMO | Admitting: Internal Medicine

## 2020-08-31 ENCOUNTER — Other Ambulatory Visit: Payer: Self-pay

## 2020-08-31 ENCOUNTER — Encounter: Payer: Self-pay | Admitting: Internal Medicine

## 2020-08-31 VITALS — BP 128/80 | HR 66 | Temp 97.7°F | Ht 68.0 in | Wt 159.0 lb

## 2020-08-31 DIAGNOSIS — Z8673 Personal history of transient ischemic attack (TIA), and cerebral infarction without residual deficits: Secondary | ICD-10-CM | POA: Diagnosis not present

## 2020-08-31 DIAGNOSIS — N1831 Chronic kidney disease, stage 3a: Secondary | ICD-10-CM | POA: Diagnosis not present

## 2020-08-31 DIAGNOSIS — G3184 Mild cognitive impairment, so stated: Secondary | ICD-10-CM

## 2020-08-31 DIAGNOSIS — Z7189 Other specified counseling: Secondary | ICD-10-CM

## 2020-08-31 DIAGNOSIS — I471 Supraventricular tachycardia: Secondary | ICD-10-CM

## 2020-08-31 DIAGNOSIS — E1121 Type 2 diabetes mellitus with diabetic nephropathy: Secondary | ICD-10-CM | POA: Diagnosis not present

## 2020-08-31 DIAGNOSIS — Z0000 Encounter for general adult medical examination without abnormal findings: Secondary | ICD-10-CM

## 2020-08-31 LAB — POCT GLYCOSYLATED HEMOGLOBIN (HGB A1C): Hemoglobin A1C: 6.4 % — AB (ref 4.0–5.6)

## 2020-08-31 LAB — HM DIABETES FOOT EXAM

## 2020-08-31 NOTE — Assessment & Plan Note (Signed)
GFR actually up slightly on lisinopril

## 2020-08-31 NOTE — Assessment & Plan Note (Signed)
See social history 

## 2020-08-31 NOTE — Assessment & Plan Note (Signed)
I have personally reviewed the Medicare Annual Wellness questionnaire and have noted 1. The patient's medical and social history 2. Their use of alcohol, tobacco or illicit drugs 3. Their current medications and supplements 4. The patient's functional ability including ADL's, fall risks, home safety risks and hearing or visual             impairment. 5. Diet and physical activities 6. Evidence for depression or mood disorders  The patients weight, height, BMI and visual acuity have been recorded in the chart I have made referrals, counseling and provided education to the patient based review of the above and I have provided the pt with a written personalized care plan for preventive services.  I have provided you with a copy of your personalized plan for preventive services. Please take the time to review along with your updated medication list.  Getting another colonoscopy next week Probably no more PSAs---just had PIN Discussed exercise COVID second booster later this year Flu vaccine in the fall Probably no shingrix since just had shingles

## 2020-08-31 NOTE — Progress Notes (Signed)
Hearing Screening  Method: Audiometry   500Hz  1000Hz  2000Hz  4000Hz   Right ear 20 20 20 20   Left ear 20 25 20  0  Vision Screening - Comments:: March 2022

## 2020-08-31 NOTE — Assessment & Plan Note (Signed)
No symptomatic recurrence

## 2020-08-31 NOTE — Assessment & Plan Note (Signed)
No changes and no functional problems Observe only

## 2020-08-31 NOTE — Assessment & Plan Note (Addendum)
Hopefully still good control on the glipizide and metformin  A1c stable at 6.4% No changes needed

## 2020-08-31 NOTE — Progress Notes (Signed)
Subjective:    Patient ID: Tony Jackson., male    DOB: 1940/08/04, 80 y.o.   MRN: [id number]  HPI Here with wife for Medicare wellness visit and follow up of chronic health conditions This visit occurred during the SARS-CoV-2 public health emergency.  Safety protocols were in place, including screening questions prior to the visit, additional usage of staff PPE, and extensive cleaning of exam room while observing appropriate contact time as indicated for disinfecting solutions.   Reviewed form and advanced directives Reviewed other doctors No alcohol  Still chews tobacco--not willing to stop Vision is okay---mild retinopathy Hearing is not great per wife--he does okay No falls No depression or anhedonia Independent with instrumental ADLs Memory is not great--but no decline  Did have a large polyp on the colonoscopy Getting done again next week  Keeps up with Dr Alinda Money yearly now Had PIN  Checks sugars once a week or so Usually around 100 No low sugar reactions Feet remain numb--some mild pain at times  Last GFR stable at 49 (increased from a year ago) Last PTH normal  No palpitations or chest pain No SOB No edema No dizziness or syncope  Current Outpatient Medications on File Prior to Visit  Medication Sig Dispense Refill   amLODipine (NORVASC) 10 MG tablet TAKE 1 TABLET BY MOUTH EVERY DAY 90 tablet 3   aspirin EC 81 MG tablet Take 81 mg by mouth daily.     atorvastatin (LIPITOR) 80 MG tablet TAKE 1 TABLET BY MOUTH EVERY DAY 90 tablet 3   Cholecalciferol (VITAMIN D3) 50 MCG (2000 UT) TABS Take 4,000 Units by mouth daily.     clopidogrel (PLAVIX) 75 MG tablet TAKE 1 TABLET BY MOUTH EVERY DAY 90 tablet 3   glipiZIDE (GLUCOTROL XL) 5 MG 24 hr tablet TAKE 1 TABLET BY MOUTH EVERY DAY WITH BREAKFAST 90 tablet 3   hydrocortisone 2.5 % cream APPLY TOPICALLY 3 TIMES DAILY AS NEEDED. 28.35 g 3   ketoconazole (NIZORAL) 2 % shampoo APPLY 2-3 TIMES A WEEK--LEAVE NO FOR 5  MINUTES 120 mL 5   lisinopril (ZESTRIL) 40 MG tablet TAKE 1 TABLET BY MOUTH EVERY DAY 90 tablet 3   metFORMIN (GLUCOPHAGE) 1000 MG tablet TAKE 1 TABLET BY MOUTH TWICE A DAY WITH MEALS 180 tablet 3   mometasone (ELOCON) 0.1 % cream APPLY 1 APPLICATION TOPICALLY 2 (TWO) TIMES DAILY AS NEEDED (SKIN). 45 g 1   ONETOUCH DELICA LANCETS FINE MISC Use 1 daily to obtain blood sample Dx Code E11.21 100 each 3   ONETOUCH VERIO test strip USE 1 STRIP DAILY TO CHECK BLOOD SUGAR DX CODE E11.21 100 strip 3   PEG-KCl-NaCl-NaSulf-Na Asc-C (PLENVU) 140 g SOLR Take 1 kit by mouth as directed. Use coupon: BIN: [id number] PNC: CNRX Group: [id number] ID: [id number] (Patient not taking: Reported on 08/31/2020) 1 each 0   No current facility-administered medications on file prior to visit.    Allergies  Allergen Reactions   Poison Ivy Extract Rash    Past Medical History:  Diagnosis Date   Cataract    in both eyes    had cataracts surgically removed   Complication of anesthesia    reports during prostate bx year ago , his BP "dropped real low" and he had to stay overnight after what was suposed to be an amulatory surgery    CVA (cerebral infarction)    Diabetes mellitus    ED (erectile dysfunction)    Elevated PSA  Frequency of urination    GERD (gastroesophageal reflux disease)    History of nephrolithiasis    Hyperlipidemia    Hypertension    Lung nodule    Psoriasis    PSVT (paroxysmal supraventricular tachycardia) ([place])    Stroke ([place])    2015   Stroke ([place]) 08/2017   reports no lasting deficits since stroke, endorses that he feels back to regular self since before stroke     Past Surgical History:  Procedure Laterality Date   CATARACT EXTRACTION W/ INTRAOCULAR LENS IMPLANT Right 03/30/12   Dr Kathrin Penner   COLONOSCOPY     most recent 2019   ELECTROPHYSIOLOGIC STUDY N/A 09/24/2014   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   NM MYOVIEW LTD     normal EF 56% 03/08   PARATHYROIDECTOMY Left 02/28/2018    Procedure: LEFT INFERIOR PARATHYROIDECTOMY;  Surgeon: Armandina Gemma, MD;  Location: [facility];  Service: General;  Laterality: Left;   POLYPECTOMY     PROSTATE BIOPSY  01/05/2012   Procedure: BIOPSY TRANSRECTAL ULTRASONIC PROSTATE (TUBP);  Surgeon: [name], MD;  Location: [facility];  Service: Urology;  Laterality: N/A;   PROSTATE BIOPSY N/A 10/14/2018   Procedure: BIOPSY TRANSRECTAL ULTRASONIC PROSTATE (TUBP);  Surgeon: Raynelle Bring, MD;  Location: [facility];  Service: Urology;  Laterality: N/A;   TEE WITHOUT CARDIOVERSION N/A 05/21/2014   Procedure: TRANSESOPHAGEAL ECHOCARDIOGRAM (TEE);  Surgeon: Jerline Pain, MD;  Location: [facility];  Service: Cardiovascular;  Laterality: N/A;   TONSILLECTOMY  1960    Family History  Problem Relation Age of Onset   Diabetes Mother    Breast cancer Mother    Colon polyps Brother    Heart disease Neg Hx    Colon cancer Neg Hx    Esophageal cancer Neg Hx    Rectal cancer Neg Hx    Stomach cancer Neg Hx     Social History   Socioeconomic History   Marital status: Married    Spouse name: Peter Congo   Number of children: 2   Years of education: HS   Highest education level: Not on file  Occupational History   Occupation: Architect- paving    Comment: Retired  Tobacco Use   Smoking status: Former    Packs/day: 1.00    Years: 5.00    Pack years: 5.00    Types: Cigarettes   Smokeless tobacco: Current    Types: Chew   Tobacco comments:    QUIT SMOKING CIGARETTES 50 YRS AGO--  CHEWED TOBACCO FOR 40 YRS  Vaping Use   Vaping Use: Never used  Substance and Sexual Activity   Alcohol use: Not Currently    Alcohol/week: 0.0 standard drinks   Drug use: No   Sexual activity: Not Currently  Other Topics Concern   Not on file  Social History Narrative   Has living will   Requests wife as health care POA. Children would be alternate   Would accept resuscitation attempts   Not sure about tube feeds      Lives at home with his  wife   Right handed    Caffeine: occasional    Social Determinants of Health   Financial Resource Strain: Not on file  Food Insecurity: Not on file  Transportation Needs: Not on file  Physical Activity: Not on file  Stress: Not on file  Social Connections: Not on file  Intimate Partner Violence: Not on file   Review of Systems Appetite is good Weight is down slightly  Sleeps well Wears seat belt Teeth okay---sees dentist Needs new derm--his retired. No suspicious lesions now No heartburn or dysphagia Bowels move fine. No blood Voids okay No sig back or joint pains Bleeds easy on the plavix    Objective:   Physical Exam Constitutional:      Appearance: Normal appearance.  HENT:     Mouth/Throat:     Comments: No lesions Eyes:     Conjunctiva/sclera: Conjunctivae normal.     Pupils: Pupils are equal, round, and reactive to light.  Cardiovascular:     Rate and Rhythm: Normal rate and regular rhythm.     Pulses: Normal pulses.     Heart sounds: No murmur heard.   No gallop.  Pulmonary:     Effort: Pulmonary effort is normal.     Breath sounds: Normal breath sounds. No wheezing or rales.  Abdominal:     Palpations: Abdomen is soft.     Tenderness: There is no abdominal tenderness.  Musculoskeletal:     Cervical back: Neck supple.     Right lower leg: No edema.     Left lower leg: No edema.  Lymphadenopathy:     Cervical: No cervical adenopathy.  Skin:    General: Skin is warm.     Findings: No rash.  Neurological:     Mental Status: He is alert and oriented to person, place, and time.     Comments: President---"Biden, Trump, ?" [phone]-? D-l-  "its too much" Recall 1/3  Slightly decreased sensation in feet  Psychiatric:        Mood and Affect: Mood normal.        Behavior: Behavior normal.           Assessment & Plan:

## 2020-08-31 NOTE — Assessment & Plan Note (Signed)
Continues on plavix and statin

## 2020-09-07 ENCOUNTER — Encounter: Payer: Self-pay | Admitting: Gastroenterology

## 2020-09-07 ENCOUNTER — Ambulatory Visit (AMBULATORY_SURGERY_CENTER): Payer: Medicare HMO | Admitting: Gastroenterology

## 2020-09-07 ENCOUNTER — Other Ambulatory Visit: Payer: Self-pay

## 2020-09-07 VITALS — BP 130/85 | HR 66 | Temp 96.8°F | Resp 11 | Ht 68.0 in | Wt 162.0 lb

## 2020-09-07 DIAGNOSIS — Z1211 Encounter for screening for malignant neoplasm of colon: Secondary | ICD-10-CM | POA: Diagnosis not present

## 2020-09-07 DIAGNOSIS — Z86010 Personal history of colonic polyps: Secondary | ICD-10-CM | POA: Diagnosis not present

## 2020-09-07 DIAGNOSIS — D128 Benign neoplasm of rectum: Secondary | ICD-10-CM

## 2020-09-07 DIAGNOSIS — K621 Rectal polyp: Secondary | ICD-10-CM | POA: Diagnosis not present

## 2020-09-07 DIAGNOSIS — D123 Benign neoplasm of transverse colon: Secondary | ICD-10-CM

## 2020-09-07 HISTORY — PX: COLONOSCOPY: SHX174

## 2020-09-07 MED ORDER — SODIUM CHLORIDE 0.9 % IV SOLN: 500.0000 mL | Freq: Once | INTRAVENOUS | Status: DC

## 2020-09-07 NOTE — Progress Notes (Signed)
Vitals DT  Updated pt record

## 2020-09-07 NOTE — Progress Notes (Signed)
To PACU, VSS. Report to [email or web address] 

## 2020-09-07 NOTE — Patient Instructions (Signed)
Resume previous diet. Resume Plavix at prior dose tomorrow Awaiting pathology results. Based on age and current guidelines, no repeat surveillance recommended.  YOU HAD AN ENDOSCOPIC PROCEDURE TODAY AT [facility]:   Refer to the procedure report that was given to you for any specific questions about what was found during the examination.  If the procedure report does not answer your questions, please call your gastroenterologist to clarify.  If you requested that your care partner not be given the details of your procedure findings, then the procedure report has been included in a sealed envelope for you to review at your convenience later.  YOU SHOULD EXPECT: Some feelings of bloating in the abdomen. Passage of more gas than usual.  Walking can help get rid of the air that was put into your GI tract during the procedure and reduce the bloating. If you had a lower endoscopy (such as a colonoscopy or flexible sigmoidoscopy) you may notice spotting of blood in your stool or on the toilet paper. If you underwent a bowel prep for your procedure, you may not have a normal bowel movement for a few days.  Please Note:  You might notice some irritation and congestion in your nose or some drainage.  This is from the oxygen used during your procedure.  There is no need for concern and it should clear up in a day or so.  SYMPTOMS TO REPORT IMMEDIATELY:  Following lower endoscopy (colonoscopy or flexible sigmoidoscopy):  Excessive amounts of blood in the stool  Significant tenderness or worsening of abdominal pains  Swelling of the abdomen that is new, acute  Fever of 100F or higher  For urgent or emergent issues, a gastroenterologist can be reached at any hour by calling [phone]. Do not use MyChart messaging for urgent concerns.    DIET:  We do recommend a small meal at first, but then you may proceed to your regular diet.  Drink plenty of fluids but you should avoid alcoholic  beverages for 24 hours.  ACTIVITY:  You should plan to take it easy for the rest of today and you should NOT DRIVE or use heavy machinery until tomorrow (because of the sedation medicines used during the test).    FOLLOW UP: Our staff will call the number listed on your records 48-72 hours following your procedure to check on you and address any questions or concerns that you may have regarding the information given to you following your procedure. If we do not reach you, we will leave a message.  We will attempt to reach you two times.  During this call, we will ask if you have developed any symptoms of COVID 19. If you develop any symptoms (ie: fever, flu-like symptoms, shortness of breath, cough etc.) before then, please call [phone].  If you test positive for Covid 19 in the 2 weeks post procedure, please call and report this information to [place].    If any biopsies were taken you will be contacted by phone or by letter within the next 1-3 weeks.  Please call us at [phone] if you have not heard about the biopsies in 3 weeks.    SIGNATURES/CONFIDENTIALITY: You and/or your care partner have signed paperwork which will be entered into your electronic medical record.  These signatures attest to the fact that that the information above on your After Visit Summary has been reviewed and is understood.  Full responsibility of the confidentiality of this discharge information lies with you  and/or your care-partner.

## 2020-09-07 NOTE — Op Note (Signed)
[facility] Patient Name: Tony Jackson Procedure Date: 09/07/2020 11:39 AM MRN: [id number] Endoscopist: [place]. Loletha Carrow , MD Age: 80 Referring MD:  Date of Birth: 19-Mar-1940 Gender: Male Account #: [id number] Procedure:                Colonoscopy Indications:              Surveillance: Personal history of piecemeal removal                            of adenoma on last colonoscopy (less than 1 year                            ago) Medicines:                Monitored Anesthesia Care Procedure:                Pre-Anesthesia Assessment:                           - Prior to the procedure, a History and Physical                            was performed, and patient medications and                            allergies were reviewed. The patient's tolerance of                            previous anesthesia was also reviewed. The risks                            and benefits of the procedure and the sedation                            options and risks were discussed with the patient.                            All questions were answered, and informed consent                            was obtained. Prior Anticoagulants: The patient has                            taken Plavix (clopidogrel), last dose was 5 days                            prior to procedure. ASA Grade Assessment: III - A                            patient with severe systemic disease. After                            reviewing the risks and benefits, the patient was  deemed in satisfactory condition to undergo the                            procedure.                           After obtaining informed consent, the colonoscope                            was passed under direct vision. Throughout the                            procedure, the patient's blood pressure, pulse, and                            oxygen saturations were monitored continuously. The                            Olympus  [id number] [id number]) Colonoscope was                            introduced through the anus and advanced to the the                            cecum, identified by appendiceal orifice and                            ileocecal valve. The colonoscopy was performed                            without difficulty. The patient tolerated the                            procedure well. The quality of the bowel                            preparation was good. The ileocecal valve,                            appendiceal orifice, and rectum were photographed. Scope In: 11:48:16 AM Scope Out: 12:03:43 PM Scope Withdrawal Time: 0 hours 12 minutes 14 seconds  Total Procedure Duration: 0 hours 15 minutes 27 seconds  Findings:                 The perianal and digital rectal examinations were                            normal.                           A tattoo was seen in the mid transverse colon. A                            scar was found at the tattoo site.  A 8 mm polyp was found in the mid transverse colon                            (prior polypectomy site). The polyp was                            semi-sessile. The polyp was removed with a hot                            snare. Resection and retrieval were complete.                           Multiple diverticula were found in the left colon.                           A 4 mm polyp was found in the distal rectum. The                            polyp was sessile. The polyp was removed with a hot                            snare. Resection and retrieval were complete.                           The exam was otherwise without abnormality on                            direct and retroflexion views. Complications:            No immediate complications. Estimated Blood Loss:     Estimated blood loss: none. Impression:               - A tattoo was seen in the mid transverse colon. A                            scar was found at the tattoo  site.                           - One 8 mm polyp in the mid transverse colon,                            removed with a hot snare. Resected and retrieved.                           - Diverticulosis in the left colon.                           - One 4 mm polyp in the distal rectum, removed with                            a hot snare. Resected and retrieved.                           -  The examination was otherwise normal on direct                            and retroflexion views. Recommendation:           - Patient has a contact number available for                            emergencies. The signs and symptoms of potential                            delayed complications were discussed with the                            patient. Return to normal activities tomorrow.                            Written discharge instructions were provided to the                            patient.                           - Resume previous diet.                           - Resume Plavix (clopidogrel) at prior dose                            tomorrow.                           - Await pathology results.                           - Based on age and current guidelines, no repeat                            surveillance colonoscopy recommended. Velisa Regnier L. Loletha Carrow, MD 09/07/2020 12:24:07 PM This report has been signed electronically.

## 2020-09-07 NOTE — Progress Notes (Signed)
Called to room to assist during endoscopic procedure.  Patient ID and intended procedure confirmed with present staff. Received instructions for my participation in the procedure from the performing physician.  

## 2020-09-09 ENCOUNTER — Telehealth: Payer: Self-pay

## 2020-09-09 NOTE — Telephone Encounter (Signed)
  Follow up Call-  Call back number 09/07/2020 12/29/2019  Post procedure Call Back phone  # [phone]  Permission to leave phone message Yes Yes  Some recent data might be hidden     Patient questions:  Do you have a fever, pain , or abdominal swelling? No. Pain Score  0 *  Have you tolerated food without any problems? Yes.    Have you been able to return to your normal activities? Yes.    Do you have any questions about your discharge instructions: Diet   No. Medications  No. Follow up visit  No.  Do you have questions or concerns about your Care? No.  Actions: * If pain score is 4 or above: No action needed, pain <4.  Have you developed a fever since your procedure? no  2.   Have you had an respiratory symptoms (SOB or cough) since your procedure? no  3.   Have you tested positive for COVID 19 since your procedure no  4.   Have you had any family members/close contacts diagnosed with the COVID 19 since your procedure?  no   If yes to any of these questions please route to Joylene John, RN and Joella Prince, RN

## 2020-09-13 ENCOUNTER — Encounter: Payer: Self-pay | Admitting: Gastroenterology

## 2020-09-24 ENCOUNTER — Other Ambulatory Visit: Payer: Self-pay | Admitting: Internal Medicine

## 2020-10-28 ENCOUNTER — Telehealth: Payer: Self-pay | Admitting: Internal Medicine

## 2020-10-28 MED ORDER — MOMETASONE FUROATE 0.1 % EX CREA: 1.0000 "application " | TOPICAL_CREAM | Freq: Two times a day (BID) | CUTANEOUS | 1 refills | Status: DC | PRN

## 2020-10-28 NOTE — Telephone Encounter (Signed)
Rx sent electronically.  

## 2020-10-28 NOTE — Telephone Encounter (Signed)
  Encourage patient to contact the pharmacy for refills or they can request refills through [facility]:  Please schedule appointment if longer than 1 year  NEXT APPOINTMENT DATE:  MEDICATION: mometasone (ELOCON) 0.1 % cream hydrocortisone 2.5 % cream  Is the patient out of medication? yes  PHARMACY: [facility] [address]  Let patient know to contact pharmacy at the end of the day to make sure medication is ready.  Please notify patient to allow 48-72 hours to process  CLINICAL FILLS OUT ALL BELOW:   LAST REFILL:  QTY:  REFILL DATE:    OTHER COMMENTS:    Okay for refill?  Please advise

## 2020-10-28 NOTE — Addendum Note (Signed)
Addended by: Pilar Grammes on: 10/28/2020 03:41 PM   Modules accepted: Orders

## 2020-12-29 DIAGNOSIS — R972 Elevated prostate specific antigen [PSA]: Secondary | ICD-10-CM | POA: Diagnosis not present

## 2021-01-05 DIAGNOSIS — N401 Enlarged prostate with lower urinary tract symptoms: Secondary | ICD-10-CM | POA: Diagnosis not present

## 2021-01-05 DIAGNOSIS — R972 Elevated prostate specific antigen [PSA]: Secondary | ICD-10-CM | POA: Diagnosis not present

## 2021-01-05 DIAGNOSIS — N3941 Urge incontinence: Secondary | ICD-10-CM | POA: Diagnosis not present

## 2021-02-06 ENCOUNTER — Other Ambulatory Visit: Payer: Self-pay | Admitting: Internal Medicine

## 2021-02-26 ENCOUNTER — Other Ambulatory Visit: Payer: Self-pay | Admitting: Internal Medicine

## 2021-03-04 ENCOUNTER — Ambulatory Visit: Payer: Medicare HMO | Admitting: Internal Medicine

## 2021-03-15 ENCOUNTER — Other Ambulatory Visit: Payer: Self-pay

## 2021-03-15 ENCOUNTER — Ambulatory Visit (INDEPENDENT_AMBULATORY_CARE_PROVIDER_SITE_OTHER): Payer: Medicare HMO | Admitting: Internal Medicine

## 2021-03-15 ENCOUNTER — Encounter: Payer: Self-pay | Admitting: Internal Medicine

## 2021-03-15 VITALS — BP 138/80 | HR 77 | Temp 98.2°F | Ht 68.0 in | Wt 163.0 lb

## 2021-03-15 DIAGNOSIS — E1121 Type 2 diabetes mellitus with diabetic nephropathy: Secondary | ICD-10-CM | POA: Diagnosis not present

## 2021-03-15 DIAGNOSIS — I471 Supraventricular tachycardia: Secondary | ICD-10-CM

## 2021-03-15 DIAGNOSIS — N1831 Chronic kidney disease, stage 3a: Secondary | ICD-10-CM

## 2021-03-15 DIAGNOSIS — B0229 Other postherpetic nervous system involvement: Secondary | ICD-10-CM

## 2021-03-15 DIAGNOSIS — I10 Essential (primary) hypertension: Secondary | ICD-10-CM | POA: Diagnosis not present

## 2021-03-15 LAB — POCT GLYCOSYLATED HEMOGLOBIN (HGB A1C): Hemoglobin A1C: 6.3 % — AB (ref 4.0–5.6)

## 2021-03-15 NOTE — Progress Notes (Signed)
Subjective:    Patient ID: Tony Jackson., male    DOB: Feb 06, 1941, 81 y.o.   MRN: [id number]  HPI Here with wife for follow up of diabetes and other chronic health conditions  Diabetes has been okay Checks occasionally --1-2 per week Was 123 this morning---fairly average for him Has neuropathy in left foot--some burning. May be post herpetic GFR is stable again at 49  No chest pain No SOB No palpitations--but notes skips at times No edema  No change in mild memory issues  Current Outpatient Medications on File Prior to Visit  Medication Sig Dispense Refill   amLODipine (NORVASC) 10 MG tablet TAKE 1 TABLET BY MOUTH EVERY DAY 90 tablet 3   aspirin EC 81 MG tablet Take 81 mg by mouth daily.     atorvastatin (LIPITOR) 80 MG tablet TAKE 1 TABLET BY MOUTH EVERY DAY 90 tablet 3   Cholecalciferol (VITAMIN D3) 50 MCG (2000 UT) TABS Take 2,000 Units by mouth daily.     clopidogrel (PLAVIX) 75 MG tablet TAKE 1 TABLET BY MOUTH EVERY DAY 90 tablet 3   glipiZIDE (GLUCOTROL XL) 5 MG 24 hr tablet TAKE 1 TABLET BY MOUTH EVERY DAY WITH BREAKFAST 90 tablet 3   hydrocortisone 2.5 % cream APPLY TOPICALLY 3 TIMES DAILY AS NEEDED. 28.35 g 3   ketoconazole (NIZORAL) 2 % shampoo APPLY TO 2 TO 3 TIMES A WEEK. LEAVE ON FOR 5MINS 120 mL 6   lisinopril (ZESTRIL) 40 MG tablet TAKE 1 TABLET BY MOUTH EVERY DAY 90 tablet 0   metFORMIN (GLUCOPHAGE) 1000 MG tablet TAKE 1 TABLET BY MOUTH TWICE A DAY WITH MEALS 180 tablet 3   mometasone (ELOCON) 0.1 % cream Apply 1 application topically 2 (two) times daily as needed (skin). 45 g 1   ONETOUCH DELICA LANCETS FINE MISC Use 1 daily to obtain blood sample Dx Code E11.21 100 each 3   ONETOUCH VERIO test strip USE 1 STRIP DAILY TO CHECK BLOOD SUGAR DX CODE E11.21 100 strip 3   No current facility-administered medications on file prior to visit.    Allergies  Allergen Reactions   Poison Ivy Extract Rash    Past Medical History:  Diagnosis Date   Cataract     in both eyes    had cataracts surgically removed   Complication of anesthesia    reports during prostate bx year ago , his BP "dropped real low" and he had to stay overnight after what was suposed to be an amulatory surgery    CVA (cerebral infarction)    Diabetes mellitus    ED (erectile dysfunction)    Elevated PSA    Frequency of urination    GERD (gastroesophageal reflux disease)    History of nephrolithiasis    Hyperlipidemia    Hypertension    Lung nodule    Psoriasis    PSVT (paroxysmal supraventricular tachycardia) ([place])    Stroke ([place])    2015   Stroke ([place]) 08/2017   reports no lasting deficits since stroke, endorses that he feels back to regular self since before stroke     Past Surgical History:  Procedure Laterality Date   CATARACT EXTRACTION W/ INTRAOCULAR LENS IMPLANT Right 03/30/2012   Dr Kathrin Penner   COLONOSCOPY  09/07/2020   2019   ELECTROPHYSIOLOGIC STUDY N/A 09/24/2014   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   NM MYOVIEW LTD     normal EF 56% 03/08   PARATHYROIDECTOMY Left 02/28/2018   Procedure: LEFT  INFERIOR PARATHYROIDECTOMY;  Surgeon: Armandina Gemma, MD;  Location: [facility];  Service: General;  Laterality: Left;   POLYPECTOMY     PROSTATE BIOPSY  01/05/2012   Procedure: BIOPSY TRANSRECTAL ULTRASONIC PROSTATE (TUBP);  Surgeon: [name], MD;  Location: [facility];  Service: Urology;  Laterality: N/A;   PROSTATE BIOPSY N/A 10/14/2018   Procedure: BIOPSY TRANSRECTAL ULTRASONIC PROSTATE (TUBP);  Surgeon: Raynelle Bring, MD;  Location: [facility];  Service: Urology;  Laterality: N/A;   TEE WITHOUT CARDIOVERSION N/A 05/21/2014   Procedure: TRANSESOPHAGEAL ECHOCARDIOGRAM (TEE);  Surgeon: Jerline Pain, MD;  Location: [facility]COPY;  Service: Cardiovascular;  Laterality: N/A;   TONSILLECTOMY  1960    Family History  Problem Relation Age of Onset   Diabetes Mother    Breast cancer Mother    Colon polyps Brother    Heart disease Neg Hx    Colon cancer Neg  Hx    Esophageal cancer Neg Hx    Rectal cancer Neg Hx    Stomach cancer Neg Hx     Social History   Socioeconomic History   Marital status: Married    Spouse name: Peter Congo   Number of children: 2   Years of education: HS   Highest education level: Not on file  Occupational History   Occupation: Architect- paving    Comment: Retired  Tobacco Use   Smoking status: Former    Packs/day: 1.00    Years: 5.00    Pack years: 5.00    Types: Cigarettes   Smokeless tobacco: Current    Types: Chew   Tobacco comments:    QUIT SMOKING CIGARETTES 50 YRS AGO--  CHEWED TOBACCO FOR 40 YRS  Vaping Use   Vaping Use: Never used  Substance and Sexual Activity   Alcohol use: Not Currently    Alcohol/week: 0.0 standard drinks   Drug use: No   Sexual activity: Not Currently  Other Topics Concern   Not on file  Social History Narrative   Has living will   Requests wife as health care POA. Children would be alternate   Would accept resuscitation attempts   Not sure about tube feeds      Lives at home with his wife   Right handed    Caffeine: occasional    Social Determinants of Health   Financial Resource Strain: Not on file  Food Insecurity: Not on file  Transportation Needs: Not on file  Physical Activity: Not on file  Stress: Not on file  Social Connections: Not on file  Intimate Partner Violence: Not on file   Review of Systems Sleeps well--not bothered by neuropathy yet Appetite is good Weight is stable    Objective:   Physical Exam Constitutional:      Appearance: Normal appearance.  Cardiovascular:     Rate and Rhythm: Normal rate and regular rhythm.     Pulses: Normal pulses.     Heart sounds: No murmur heard.   No gallop.  Pulmonary:     Effort: Pulmonary effort is normal.     Breath sounds: Normal breath sounds. No wheezing or rales.  Musculoskeletal:     Cervical back: Neck supple.     Right lower leg: No edema.     Left lower leg: No edema.   Lymphadenopathy:     Cervical: No cervical adenopathy.  Skin:    Comments: No foot lesions  Neurological:     Mental Status: He is alert.  Psychiatric:  Mood and Affect: Mood normal.        Behavior: Behavior normal.           Assessment & Plan:

## 2021-03-15 NOTE — Assessment & Plan Note (Signed)
BP Readings from Last 3 Encounters:  03/15/21 138/80  09/07/20 130/85  08/31/20 128/80   Controlled with lisinopril 40mg  and amlodipine 10mg  daily

## 2021-03-15 NOTE — Assessment & Plan Note (Signed)
Will feel skips at times but no racing

## 2021-03-15 NOTE — Assessment & Plan Note (Signed)
Lab Results  Component Value Date   HGBA1C 6.3 (A) 03/15/2021   Good control on glipizide 5mg  and metformin 1000 bid Renal function stable

## 2021-03-15 NOTE — Assessment & Plan Note (Signed)
Will recheck GFR next time On lisinopril 40mg  daily

## 2021-03-15 NOTE — Assessment & Plan Note (Signed)
Mild in left foot If worsens, would start low dose gabapentin

## 2021-04-06 ENCOUNTER — Other Ambulatory Visit: Payer: Self-pay | Admitting: Internal Medicine

## 2021-04-20 DIAGNOSIS — I10 Essential (primary) hypertension: Secondary | ICD-10-CM | POA: Diagnosis not present

## 2021-04-20 DIAGNOSIS — Z7982 Long term (current) use of aspirin: Secondary | ICD-10-CM | POA: Diagnosis not present

## 2021-04-20 DIAGNOSIS — Z720 Tobacco use: Secondary | ICD-10-CM | POA: Diagnosis not present

## 2021-04-20 DIAGNOSIS — N3941 Urge incontinence: Secondary | ICD-10-CM | POA: Diagnosis not present

## 2021-04-20 DIAGNOSIS — I499 Cardiac arrhythmia, unspecified: Secondary | ICD-10-CM | POA: Diagnosis not present

## 2021-04-20 DIAGNOSIS — E785 Hyperlipidemia, unspecified: Secondary | ICD-10-CM | POA: Diagnosis not present

## 2021-04-20 DIAGNOSIS — N529 Male erectile dysfunction, unspecified: Secondary | ICD-10-CM | POA: Diagnosis not present

## 2021-04-20 DIAGNOSIS — E11620 Type 2 diabetes mellitus with diabetic dermatitis: Secondary | ICD-10-CM | POA: Diagnosis not present

## 2021-04-20 DIAGNOSIS — Z809 Family history of malignant neoplasm, unspecified: Secondary | ICD-10-CM | POA: Diagnosis not present

## 2021-04-20 DIAGNOSIS — E1142 Type 2 diabetes mellitus with diabetic polyneuropathy: Secondary | ICD-10-CM | POA: Diagnosis not present

## 2021-04-20 DIAGNOSIS — Z7984 Long term (current) use of oral hypoglycemic drugs: Secondary | ICD-10-CM | POA: Diagnosis not present

## 2021-04-20 DIAGNOSIS — Z7902 Long term (current) use of antithrombotics/antiplatelets: Secondary | ICD-10-CM | POA: Diagnosis not present

## 2021-05-10 DIAGNOSIS — Z961 Presence of intraocular lens: Secondary | ICD-10-CM | POA: Diagnosis not present

## 2021-05-10 DIAGNOSIS — H35371 Puckering of macula, right eye: Secondary | ICD-10-CM | POA: Diagnosis not present

## 2021-05-10 DIAGNOSIS — E113293 Type 2 diabetes mellitus with mild nonproliferative diabetic retinopathy without macular edema, bilateral: Secondary | ICD-10-CM | POA: Diagnosis not present

## 2021-05-12 ENCOUNTER — Other Ambulatory Visit: Payer: Self-pay | Admitting: Internal Medicine

## 2021-06-22 DIAGNOSIS — R972 Elevated prostate specific antigen [PSA]: Secondary | ICD-10-CM | POA: Diagnosis not present

## 2021-06-29 DIAGNOSIS — N3941 Urge incontinence: Secondary | ICD-10-CM | POA: Diagnosis not present

## 2021-06-29 DIAGNOSIS — N401 Enlarged prostate with lower urinary tract symptoms: Secondary | ICD-10-CM | POA: Diagnosis not present

## 2021-06-29 DIAGNOSIS — R972 Elevated prostate specific antigen [PSA]: Secondary | ICD-10-CM | POA: Diagnosis not present

## 2021-07-23 ENCOUNTER — Other Ambulatory Visit: Payer: Self-pay | Admitting: Internal Medicine

## 2021-09-12 ENCOUNTER — Encounter: Payer: Medicare HMO | Admitting: Internal Medicine

## 2021-09-23 ENCOUNTER — Ambulatory Visit (INDEPENDENT_AMBULATORY_CARE_PROVIDER_SITE_OTHER): Payer: Medicare HMO | Admitting: Internal Medicine

## 2021-09-23 ENCOUNTER — Encounter: Payer: Self-pay | Admitting: Internal Medicine

## 2021-09-23 VITALS — BP 132/80 | HR 60 | Temp 97.8°F | Ht 68.0 in | Wt 161.0 lb

## 2021-09-23 DIAGNOSIS — G629 Polyneuropathy, unspecified: Secondary | ICD-10-CM

## 2021-09-23 DIAGNOSIS — N2581 Secondary hyperparathyroidism of renal origin: Secondary | ICD-10-CM

## 2021-09-23 DIAGNOSIS — Z0000 Encounter for general adult medical examination without abnormal findings: Secondary | ICD-10-CM

## 2021-09-23 DIAGNOSIS — I10 Essential (primary) hypertension: Secondary | ICD-10-CM

## 2021-09-23 DIAGNOSIS — E1121 Type 2 diabetes mellitus with diabetic nephropathy: Secondary | ICD-10-CM | POA: Diagnosis not present

## 2021-09-23 DIAGNOSIS — I6930 Unspecified sequelae of cerebral infarction: Secondary | ICD-10-CM | POA: Diagnosis not present

## 2021-09-23 DIAGNOSIS — I471 Supraventricular tachycardia, unspecified: Secondary | ICD-10-CM

## 2021-09-23 DIAGNOSIS — N1832 Chronic kidney disease, stage 3b: Secondary | ICD-10-CM | POA: Diagnosis not present

## 2021-09-23 LAB — RENAL FUNCTION PANEL
Albumin: 4.5 g/dL (ref 3.5–5.2)
BUN: 35 mg/dL — ABNORMAL HIGH (ref 6–23)
CO2: 27 mEq/L (ref 19–32)
Calcium: 9.4 mg/dL (ref 8.4–10.5)
Chloride: 106 mEq/L (ref 96–112)
Creatinine, Ser: 1.51 mg/dL — ABNORMAL HIGH (ref 0.40–1.50)
GFR: 43.26 mL/min — ABNORMAL LOW (ref >60.00)
Glucose, Bld: 178 mg/dL — ABNORMAL HIGH (ref 70–99)
Phosphorus: 3.3 mg/dL (ref 2.3–4.6)
Potassium: 4.8 mEq/L (ref 3.5–5.1)
Sodium: 142 mEq/L (ref 135–145)

## 2021-09-23 LAB — CBC
HCT: 39.1 % (ref 39.0–52.0)
Hemoglobin: 13.3 g/dL (ref 13.0–17.0)
MCHC: 34.1 g/dL (ref 30.0–36.0)
MCV: 92.2 fl (ref 78.0–100.0)
Platelets: 233 10*3/uL (ref 150.0–400.0)
RBC: 4.24 Mil/uL (ref 4.22–5.81)
RDW: 13.4 % (ref 11.5–15.5)
WBC: 6.7 10*3/uL (ref 4.0–10.5)

## 2021-09-23 LAB — MICROALBUMIN / CREATININE URINE RATIO
Creatinine,U: 75.3 mg/dL
Microalb Creat Ratio: 101.1 mg/g — ABNORMAL HIGH (ref 0.0–30.0)
Microalb, Ur: 76.1 mg/dL — ABNORMAL HIGH (ref 0.0–1.9)

## 2021-09-23 LAB — LIPID PANEL
Cholesterol: 131 mg/dL (ref 0–200)
HDL: 44.5 mg/dL (ref >39.00)
LDL Cholesterol: 57 mg/dL (ref 0–99)
NonHDL: 86.06
Total CHOL/HDL Ratio: 3
Triglycerides: 144 mg/dL (ref 0.0–149.0)
VLDL: 28.8 mg/dL (ref 0.0–40.0)

## 2021-09-23 LAB — HEPATIC FUNCTION PANEL
ALT: 24 U/L (ref 0–53)
AST: 16 U/L (ref 0–37)
Albumin: 4.5 g/dL (ref 3.5–5.2)
Alkaline Phosphatase: 60 U/L (ref 39–117)
Bilirubin, Direct: 0.2 mg/dL (ref 0.0–0.3)
Total Bilirubin: 0.8 mg/dL (ref 0.2–1.2)
Total Protein: 6.8 g/dL (ref 6.0–8.3)

## 2021-09-23 LAB — HM DIABETES FOOT EXAM

## 2021-09-23 LAB — VITAMIN D 25 HYDROXY (VIT D DEFICIENCY, FRACTURES): VITD: 35.32 ng/mL (ref 30.00–100.00)

## 2021-09-23 LAB — HEMOGLOBIN A1C: Hgb A1c MFr Bld: 6.6 % — ABNORMAL HIGH (ref 4.6–6.5)

## 2021-09-23 MED ORDER — METFORMIN HCL ER 500 MG PO TB24: 1000.0000 mg | ORAL_TABLET | Freq: Every day | ORAL | 3 refills | Status: DC

## 2021-09-23 MED ORDER — LISINOPRIL 40 MG PO TABS: 40.0000 mg | ORAL_TABLET | Freq: Every day | ORAL | 3 refills | Status: DC

## 2021-09-23 NOTE — Assessment & Plan Note (Signed)
Seems to have good control Fecal incontinence with metformin--will change to XR '500mg'$  2 daily (and cut back more prn) Glipizide 5

## 2021-09-23 NOTE — Assessment & Plan Note (Signed)
Mostly just mild memory issues Is on ASA 81, plavix 75, atorvastatin 80

## 2021-09-23 NOTE — Assessment & Plan Note (Signed)
I have personally reviewed the Medicare Annual Wellness questionnaire and have noted 1. The patient's medical and social history 2. Their use of alcohol, tobacco or illicit drugs 3. Their current medications and supplements 4. The patient's functional ability including ADL's, fall risks, home safety risks and hearing or visual             impairment. 5. Diet and physical activities 6. Evidence for depression or mood disorders  The patients weight, height, BMI and visual acuity have been recorded in the chart I have made referrals, counseling and provided education to the patient based review of the above and I have provided the pt with a written personalized care plan for preventive services.  I have provided you with a copy of your personalized plan for preventive services. Please take the time to review along with your updated medication list.  Done with cancer screening ---colon 1 year ago Discussed increasing exercise Consider shingrix at pharmacy Updated COVID and flu vaccines in the fall Urged him to stop or cut the chewing tobacco

## 2021-09-23 NOTE — Assessment & Plan Note (Signed)
On lisinopril Will recheck labs

## 2021-09-23 NOTE — Progress Notes (Signed)
Hearing Screening - Comments:: Passed whisper test Vision Screening - Comments:: Has appt with new dr next month

## 2021-09-23 NOTE — Assessment & Plan Note (Signed)
No symptoms of recurrence 

## 2021-09-23 NOTE — Assessment & Plan Note (Signed)
BP Readings from Last 3 Encounters:  09/23/21 132/80  03/15/21 138/80  09/07/20 130/85   Okay on lisinopril 40 daily and amlodipine '10mg'$ 

## 2021-09-23 NOTE — Progress Notes (Signed)
Subjective:    Patient ID: Tony Jackson., male    DOB: [birth year], 81 y.o.   MRN: [id number]  HPI Here for Medicare wellness visit and follow up of chronic health conditions With wife Reviewed advanced directives Reviewed other doctors----Dr Tanner--ophthal, Dr Adrian Prows, Dr Dionicia Abler No hospitalizations or surgery this year Vision is okay He feels his hearing is good---wife not as sure Still chewing tobacco--he won't stop No alcohol No falls No depression or anhedonia Independent with instrumental ADLs Stable memory problems  Not really exercising  Still having foot problems---mostly left foot Neuropathy-- pain/numbness and tingling--hasn't been taking old gabapentin Sleeps well though  Checking sugars weekly Usually under 120----145 today Careful with eating Weight is stable Known CKD--last GFR 41  No stroke type symptoms No residual ---except mild memory problems  Sees Dr Alinda Money every 6 months PSA up to 33----no action per him Voids okay Nocturia is only once usually  No palpitations Had some chest pains a week ago---just sitting. Several minutes No SOB, nausea, diaphoresis "Something there" No other SOB No dizziness or syncope  Current Outpatient Medications on File Prior to Visit  Medication Sig Dispense Refill   amLODipine (NORVASC) 10 MG tablet TAKE 1 TABLET BY MOUTH EVERY DAY 90 tablet 3   aspirin EC 81 MG tablet Take 81 mg by mouth daily.     atorvastatin (LIPITOR) 80 MG tablet TAKE 1 TABLET BY MOUTH EVERY DAY 90 tablet 3   Cholecalciferol (VITAMIN D3) 50 MCG (2000 UT) TABS Take 2,000 Units by mouth daily.     clopidogrel (PLAVIX) 75 MG tablet TAKE 1 TABLET BY MOUTH EVERY DAY 90 tablet 3   glipiZIDE (GLUCOTROL XL) 5 MG 24 hr tablet TAKE 1 TABLET BY MOUTH EVERY DAY WITH BREAKFAST 90 tablet 3   hydrocortisone 2.5 % cream APPLY TOPICALLY 3 TIMES DAILY AS NEEDED. 28.35 g 3   ketoconazole (NIZORAL) 2 % shampoo APPLY TO 2 TO 3 TIMES A  WEEK. LEAVE ON FOR 5MINS 120 mL 6   lisinopril (ZESTRIL) 40 MG tablet TAKE 1 TABLET BY MOUTH EVERY DAY 90 tablet 0   metFORMIN (GLUCOPHAGE) 1000 MG tablet TAKE 1 TABLET BY MOUTH TWICE A DAY WITH MEALS 180 tablet 3   mometasone (ELOCON) 0.1 % cream Apply 1 application topically 2 (two) times daily as needed (skin). 45 g 1   ONETOUCH DELICA LANCETS FINE MISC Use 1 daily to obtain blood sample Dx Code E11.21 100 each 3   ONETOUCH VERIO test strip USE 1 STRIP DAILY TO CHECK BLOOD SUGAR DX CODE E11.21 100 strip 3   No current facility-administered medications on file prior to visit.    Allergies  Allergen Reactions   Poison Ivy Extract Rash    Past Medical History:  Diagnosis Date   Cataract    in both eyes    had cataracts surgically removed   Complication of anesthesia    reports during prostate bx year ago , his BP "dropped real low" and he had to stay overnight after what was suposed to be an amulatory surgery    CVA (cerebral infarction)    Diabetes mellitus    ED (erectile dysfunction)    Elevated PSA    Frequency of urination    GERD (gastroesophageal reflux disease)    History of nephrolithiasis    Hyperlipidemia    Hypertension    Lung nodule    Psoriasis    PSVT (paroxysmal supraventricular tachycardia) ([place])    Stroke ([place])  2015   Stroke ([place]) 08/2017   reports no lasting deficits since stroke, endorses that he feels back to regular self since before stroke     Past Surgical History:  Procedure Laterality Date   CATARACT EXTRACTION W/ INTRAOCULAR LENS IMPLANT Right 03/30/2012   Dr Kathrin Penner   COLONOSCOPY  09/07/2020   2019   ELECTROPHYSIOLOGIC STUDY N/A 09/24/2014   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   NM MYOVIEW LTD     normal EF 56% 03/08   PARATHYROIDECTOMY Left 02/28/2018   Procedure: LEFT INFERIOR PARATHYROIDECTOMY;  Surgeon: Armandina Gemma, MD;  Location: [facility];  Service: General;  Laterality: Left;   POLYPECTOMY     PROSTATE BIOPSY  01/05/2012    Procedure: BIOPSY TRANSRECTAL ULTRASONIC PROSTATE (TUBP);  Surgeon: [name], MD;  [facility];  Service: Urology;  Laterality: N/A;   PROSTATE BIOPSY N/A 10/14/2018   Procedure: BIOPSY TRANSRECTAL ULTRASONIC PROSTATE (TUBP);  Surgeon: Raynelle Bring, MD;  Location: [facility];  Service: Urology;  Laterality: N/A;   TEE WITHOUT CARDIOVERSION N/A 05/21/2014   Procedure: TRANSESOPHAGEAL ECHOCARDIOGRAM (TEE);  Surgeon: Jerline Pain, MD;  Location: [facility] END[facility]COPY;  Service: Cardiovascular;  Laterality: N/A;   TONSILLECTOMY  1960    Family History  Problem Relation Age of Onset   Diabetes Mother    Breast cancer Mother    Colon polyps Brother    Heart disease Neg Hx    Colon cancer Neg Hx    Esophageal cancer Neg Hx    Rectal cancer Neg Hx    Stomach cancer Neg Hx     Social History   Socioeconomic History   Marital status: Married    Spouse name: Tony Jackson   Number of children: 2   Years of education: HS   Highest education level: Not on file  Occupational History   Occupation: Architect- paving    Comment: Retired  Tobacco Use   Smoking status: Former    Packs/day: 1.00    Years: 5.00    Total pack years: 5.00    Types: Cigarettes    Passive exposure: Past (as a child)   Smokeless tobacco: Current    Types: Chew   Tobacco comments:    QUIT SMOKING CIGARETTES 50 YRS AGO--  CHEWED TOBACCO FOR 40 YRS  Vaping Use   Vaping Use: Never used  Substance and Sexual Activity   Alcohol use: Not Currently    Alcohol/week: 0.0 standard drinks of alcohol   Drug use: No   Sexual activity: Not Currently  Other Topics Concern   Not on file  Social History Narrative   Has living will   Requests wife as health care POA. Children would be alternate   Would accept resuscitation attempts   Not sure about tube feeds      Lives at home with his wife   Right handed    Caffeine: occasional    Social Determinants of Health   Financial Resource Strain: Not on file   Food Insecurity: Not on file  Transportation Needs: Not on file  Physical Activity: Not on file  Stress: Not on file  Social Connections: Not on file  Intimate Partner Violence: Not on file   Review of Systems Sleeps okay Wears seat belt Teeth okay---sees dentist Gets some sores on face---mometasone helps (?seborrhea). Reminded him to use medicated shampoos No heartburn or dysphagia Bowels move frequently--?related to metformin. Some incontinence No sig back or joint pains    Objective:   Physical Exam  Constitutional:      Appearance: Normal appearance.  HENT:     Mouth/Throat:     Comments: No lesions Eyes:     Conjunctiva/sclera: Conjunctivae normal.     Pupils: Pupils are equal, round, and reactive to light.  Cardiovascular:     Rate and Rhythm: Normal rate and regular rhythm.     Pulses: Normal pulses.     Heart sounds: No murmur heard.    No gallop.     Comments: Occ skips Pulmonary:     Effort: Pulmonary effort is normal.     Breath sounds: Normal breath sounds. No wheezing or rales.  Abdominal:     Palpations: Abdomen is soft.     Tenderness: There is no abdominal tenderness.  Musculoskeletal:     Cervical back: Neck supple.     Right lower leg: No edema.     Left lower leg: No edema.  Lymphadenopathy:     Cervical: No cervical adenopathy.  Skin:    Findings: No rash.     Comments: No foot lesions Diffuse redness like on face, ?actinic on vertex He will set up with derm  Neurological:     General: No focal deficit present.     Mental Status: He is alert and oriented to person, place, and time.     Comments: Mini-cog ---clock fine, recall 2/3 Decreased sensation in feet  Psychiatric:        Mood and Affect: Mood normal.        Behavior: Behavior normal.            Assessment & Plan:

## 2021-09-23 NOTE — Assessment & Plan Note (Signed)
Having more symptoms consdier retrying gabapentin Use walking stick for stability

## 2021-09-24 LAB — PARATHYROID HORMONE, INTACT (NO CA): PTH: 93 pg/mL — ABNORMAL HIGH (ref 16–77)

## 2021-09-26 DIAGNOSIS — N2581 Secondary hyperparathyroidism of renal origin: Secondary | ICD-10-CM | POA: Insufficient documentation

## 2021-09-26 NOTE — Assessment & Plan Note (Signed)
PTH elevated Is already on vitamin D

## 2021-10-17 ENCOUNTER — Other Ambulatory Visit: Payer: Self-pay | Admitting: Internal Medicine

## 2021-11-25 ENCOUNTER — Ambulatory Visit
Admission: EM | Admit: 2021-11-25 | Discharge: 2021-11-25 | Disposition: A | Discharge status: Home / Self Care | Payer: Medicare HMO | Attending: Emergency Medicine | Admitting: Emergency Medicine

## 2021-11-25 DIAGNOSIS — S51811A Laceration without foreign body of right forearm, initial encounter: Secondary | ICD-10-CM

## 2021-11-25 DIAGNOSIS — I10 Essential (primary) hypertension: Secondary | ICD-10-CM | POA: Diagnosis not present

## 2021-11-25 MED ORDER — DOXYCYCLINE HYCLATE 100 MG PO CAPS: 100.0000 mg | ORAL_CAPSULE | Freq: Two times a day (BID) | ORAL | 0 refills | Status: AC

## 2021-11-25 NOTE — ED Provider Notes (Signed)
[facility]    CSN: [id number] Arrival date & time: 11/25/21  1194      History   Chief Complaint Chief Complaint  Patient presents with   Abrasion    HPI Tony Jackson. is a 81 y.o. male.  Accompanied by his wife, patient presents with a skin tear on his right forearm that occurred 2 days ago.  He scraped his arm on a stair banister.  Treatment at home with Neosporin and bandage.  The site has continued to bleed and has localized redness.  No fever, chills, purulent drainage, or other symptoms.  Patient is on Plavix and aspirin.  Last tetanus 2021.  The history is provided by the patient, the spouse and medical records.    Past Medical History:  Diagnosis Date   Cataract    in both eyes    had cataracts surgically removed   Complication of anesthesia    reports during prostate bx year ago , his BP "dropped real low" and he had to stay overnight after what was suposed to be an amulatory surgery    CVA (cerebral infarction)    Diabetes mellitus    ED (erectile dysfunction)    Elevated PSA    Frequency of urination    GERD (gastroesophageal reflux disease)    History of nephrolithiasis    Hyperlipidemia    Hypertension    Lung nodule    Psoriasis    PSVT (paroxysmal supraventricular tachycardia)    Stroke ([place])    2015   Stroke ([place]) 08/2017   reports no lasting deficits since stroke, endorses that he feels back to regular self since before stroke     Patient Active Problem List   Diagnosis Date Noted   Secondary hyperparathyroidism, renal ([place]) 09/26/2021   MCI (mild cognitive impairment) 08/31/2020   Neuropathy 04/16/2019   Post herpetic neuralgia 11/20/2018   Vitamin D deficiency 06/18/2017   Noncompliance with medications 08/04/2016   AKI (acute kidney injury) ([place]) 08/03/2016   Hyperkalemia 08/03/2016   Stage 3b chronic kidney disease ([place]) 01/27/2015   Advance directive discussed with patient 01/27/2015   SVT (supraventricular tachycardia)  09/24/2014   Essential hypertension    Late effect of cerebrovascular accident (CVA)    Pulmonary nodules 10/21/2013   CAD (coronary artery disease) 10/08/2013   Prostatic intraepithelial neoplasia 04/12/2012   Actinic keratosis 10/12/2011   Routine general medical examination at a health care facility 10/07/2010   NEPHROLITHIASIS, HX OF 03/18/2010   ERECTILE DYSFUNCTION, ORGANIC 02/19/2008   Type 2 diabetes mellitus with renal manifestations ([place]) 11/13/2006   Hyperlipemia 11/13/2006   Seborrheic dermatitis 11/13/2006    Past Surgical History:  Procedure Laterality Date   CATARACT EXTRACTION W/ INTRAOCULAR LENS IMPLANT Right 03/30/2012   Dr Kathrin Penner   COLONOSCOPY  09/07/2020   2019   ELECTROPHYSIOLOGIC STUDY N/A 09/24/2014   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   NM MYOVIEW LTD     normal EF 56% 03/08   PARATHYROIDECTOMY Left 02/28/2018   Procedure: LEFT INFERIOR PARATHYROIDECTOMY;  Surgeon: Armandina Gemma, MD;  Location: [facility];  Service: General;  Laterality: Left;   POLYPECTOMY     PROSTATE BIOPSY  01/05/2012   Procedure: BIOPSY TRANSRECTAL ULTRASONIC PROSTATE (TUBP);  Surgeon: [name], MD;  Location: [facility];  Service: Urology;  Laterality: N/A;   PROSTATE BIOPSY N/A 10/14/2018   Procedure: BIOPSY TRANSRECTAL ULTRASONIC PROSTATE (TUBP);  Surgeon: Raynelle Bring, MD;  Location: [facility];  Service: Urology;  Laterality:  N/A;   TEE WITHOUT CARDIOVERSION N/A 05/21/2014   Procedure: TRANSESOPHAGEAL ECHOCARDIOGRAM (TEE);  Surgeon: Jerline Pain, MD;  Location: [facility];  Service: Cardiovascular;  Laterality: N/A;   [place] Medications    Prior to Admission medications   Medication Sig Start Date End Date Taking? Authorizing Provider  doxycycline (VIBRAMYCIN) 100 MG capsule Take 1 capsule (100 mg total) by mouth 2 (two) times daily for 7 days. 11/25/21 12/02/21 Yes Sharion Balloon, NP  amLODipine (NORVASC) 10 MG tablet TAKE 1 TABLET BY  MOUTH EVERY DAY 04/06/21   Viviana Simpler I, MD  aspirin EC 81 MG tablet Take 81 mg by mouth daily.    [provider]  atorvastatin (LIPITOR) 80 MG tablet TAKE 1 TABLET BY MOUTH EVERY DAY 04/06/21   [name], MD  Cholecalciferol (VITAMIN D3) 50 MCG (2000 UT) TABS Take 2,000 Units by mouth daily.    [provider]  clopidogrel (PLAVIX) 75 MG tablet TAKE 1 TABLET BY MOUTH EVERY DAY 04/06/21   Viviana Simpler I, MD  glipiZIDE (GLUCOTROL XL) 5 MG 24 hr tablet TAKE 1 TABLET BY MOUTH EVERY DAY WITH BREAKFAST 04/06/21   Viviana Simpler I, MD  hydrocortisone 2.5 % cream APPLY TOPICALLY 3 TIMES DAILY AS NEEDED. 11/07/19   Viviana Simpler I, MD  ketoconazole (NIZORAL) 2 % shampoo APPLY TO 2 TO 3 TIMES A WEEK. LEAVE ON FOR 5MINS 02/07/21   Viviana Simpler I, MD  lisinopril (ZESTRIL) 40 MG tablet Take 1 tablet (40 mg total) by mouth daily. 09/23/21   [name], MD  metFORMIN (GLUCOPHAGE-XR) 500 MG 24 hr tablet Take 2 tablets (1,000 mg total) by mouth daily with breakfast. 09/23/21   [name], MD  mometasone (ELOCON) 0.1 % cream Apply 1 application topically 2 (two) times daily as needed (skin). 10/28/20   [name], MD  [facility] DELICA LANCETS FINE MISC Use 1 daily to obtain blood sample Dx Code E11.21 08/10/16   [name], MD  [facility] VERIO test strip USE 1 STRIP DAILY TO CHECK BLOOD SUGAR DX CODE E11.21 05/12/21   [name], MD    Family History Family History  Problem Relation Age of Onset   Diabetes Mother    Breast cancer Mother    Colon polyps Brother    Heart disease Neg Hx    Colon cancer Neg Hx    Esophageal cancer Neg Hx    Rectal cancer Neg Hx    Stomach cancer Neg Hx     Social History Social History   Tobacco Use   Smoking status: Former    Packs/day: 1.00    Years: 5.00    Total pack years: 5.00    Types: Cigarettes    Passive exposure: Past (as a child)   Smokeless tobacco: Current    Types: Chew   Tobacco comments:     QUIT SMOKING CIGARETTES 50 YRS AGO--  CHEWED TOBACCO FOR 40 YRS  Vaping Use   Vaping Use: Never used  Substance Use Topics   Alcohol use: Not Currently    Alcohol/week: 0.0 standard drinks of alcohol   Drug use: No     Allergies   Poison ivy extract   Review of Systems Review of Systems   Physical Exam Triage Vital Signs ED Triage Vitals  Enc Vitals Group     BP 11/25/21 0902 (S) (!) 158/82     Pulse Rate 11/25/21  0902 76     Resp 11/25/21 0902 18     Temp 11/25/21 0902 98.1 F (36.7 C)     Temp Source 11/25/21 0902 Temporal     SpO2 11/25/21 0902 97 %     Weight --      Height --      Head Circumference --      Peak Flow --      Pain Score 11/25/21 0913 0     Pain Loc --      Pain Edu? --      Excl. in [place]? --    No data found.  Updated Vital Signs BP (!) 145/78   Pulse 76   Temp 98.1 F (36.7 C) (Temporal)   Resp 18   SpO2 97%   Visual Acuity Right Eye Distance:   Left Eye Distance:   Bilateral Distance:    Right Eye Near:   Left Eye Near:    Bilateral Near:     Physical Exam Vitals and nursing note reviewed.  Constitutional:      General: He is not in acute distress.    Appearance: Normal appearance. He is well-developed. He is not ill-appearing.  HENT:     Mouth/Throat:     Mouth: Mucous membranes are moist.  Cardiovascular:     Rate and Rhythm: Normal rate and regular rhythm.     Heart sounds: Normal heart sounds.  Pulmonary:     Effort: Pulmonary effort is normal. No respiratory distress.     Breath sounds: Normal breath sounds.  Musculoskeletal:        General: No swelling or deformity. Normal range of motion.     Cervical back: Neck supple.  Skin:    General: Skin is warm and dry.     Capillary Refill: Capillary refill takes less than 2 seconds.     Findings: Lesion present.     Comments: Skin tear on right forearm.  Oozing blood. See picture.    Neurological:     Mental Status: He is alert.     Sensory: No sensory deficit.      Motor: No weakness.  Psychiatric:        Mood and Affect: Mood normal.        Behavior: Behavior normal.       UC Treatments / Results  Labs (all labs ordered are listed, but only abnormal results are displayed) Labs Reviewed - No data to display  EKG   Radiology No results found.  Procedures Laceration Repair  Date/Time: 11/25/2021 10:15 AM  Performed by: Sharion Balloon, NP Authorized by: Sharion Balloon, NP   Consent:    Consent obtained:  Verbal   Consent given by:  Patient   Risks discussed:  Infection, pain, poor cosmetic result and poor wound healing Universal protocol:    Procedure explained and questions answered to patient or proxy's satisfaction: yes   Anesthesia:    Anesthesia method:  None Laceration details:    Location:  Shoulder/arm   Shoulder/arm location:  R lower arm   Length (cm):  4   Depth (mm):  1 Pre-procedure details:    Preparation:  Patient was prepped and draped in usual sterile fashion Exploration:    Hemostasis achieved with:  Direct pressure   Imaging outcome: foreign body not noted     Wound exploration: wound explored through full range of motion and entire depth of wound visualized   Treatment:    Area cleansed with:  Shur-Clens Skin repair:    Repair method:  Tissue adhesive Repair type:    Repair type:  Simple Post-procedure details:    Dressing:  Open (no dressing)   Procedure completion:  Tolerated well, no immediate complications Comments:     Skin stretched back over wound as best as possible.  No bleeding after Demabond applied.   (including critical care time)  Medications Ordered in UC Medications - No data to display  Initial Impression / Assessment and Plan / UC Course  I have reviewed the triage vital signs and the nursing notes.  Pertinent labs & imaging results that were available during my care of the patient were reviewed by me and considered in my medical decision making (see chart for details).     Skin tear of right forearm.  Elevated blood pressure reading with hypertension.  Tetanus up-to-date.  Wound closed with Dermabond.  Education provided on wound care with tissue adhesive.  Treating with doxycycline (patient has diabetes and CKD).  Discussed signs of worsening infection.  Instructed patient to follow-up with his PCP on Monday.  Also discussed that his blood pressure is elevated today and needs to be rechecked by his PCP.  Education provided on managing hypertension.  Final Clinical Impressions(s) / UC Diagnoses   Final diagnoses:  Skin tear of right forearm without complication, initial encounter  Elevated blood pressure reading in office with diagnosis of hypertension     Discharge Instructions      Take the doxycycline as directed.  See the attached information on Dermabond wound care.  Follow up with your primary care provider on Monday.     Your blood pressure is elevated today at 158/82.  Please have this rechecked by your primary care provider.          ED Prescriptions     Medication Sig Dispense Auth. Provider   doxycycline (VIBRAMYCIN) 100 MG capsule Take 1 capsule (100 mg total) by mouth 2 (two) times daily for 7 days. 14 capsule Sharion Balloon, NP      PDMP not reviewed this encounter.   Sharion Balloon, NP 11/25/21 1019

## 2021-11-25 NOTE — Discharge Instructions (Addendum)
Take the doxycycline as directed.  See the attached information on Dermabond wound care.  Follow up with your primary care provider on Monday.     Your blood pressure is elevated today at 158/82.  Please have this rechecked by your primary care provider.

## 2021-11-25 NOTE — ED Triage Notes (Signed)
Patient presents to UC for skin tear x 2 days located on right arm. Secured with koban and neosporin. Stated he hit his arm against stair banister. Pt is on blood thinner. Up to date on Tdap.

## 2021-12-05 ENCOUNTER — Encounter: Payer: Self-pay | Admitting: Internal Medicine

## 2021-12-05 ENCOUNTER — Ambulatory Visit (INDEPENDENT_AMBULATORY_CARE_PROVIDER_SITE_OTHER): Payer: Medicare HMO | Admitting: Internal Medicine

## 2021-12-05 DIAGNOSIS — L98491 Non-pressure chronic ulcer of skin of other sites limited to breakdown of skin: Secondary | ICD-10-CM

## 2021-12-05 NOTE — Assessment & Plan Note (Signed)
Very superficial---from trauma I removed the dermabond---had granulating ulcer ~2cm x 2cm Part of damaged area has viable skin on it  Discussed cleaning and I covered with triple antibiotic and bandaid

## 2021-12-05 NOTE — Progress Notes (Signed)
Subjective:    Patient ID: Tony Jackson., male    DOB: 1940/06/22, 81 y.o.   MRN: [id number]  HPI Here for follow up of a skin tear on right forearm  Banged right forearm on banister and tore skin on right forearm Just distal to elbow Went to urgent care 2 days later NP pulled up the skin and glued it over the open area (dermabond) Given doxy to take--done with this This was 10 days ago  Has kept it covered mostly Not washed since then---kept covered in shower  Current Outpatient Medications on File Prior to Visit  Medication Sig Dispense Refill   amLODipine (NORVASC) 10 MG tablet TAKE 1 TABLET BY MOUTH EVERY DAY 90 tablet 3   aspirin EC 81 MG tablet Take 81 mg by mouth daily.     atorvastatin (LIPITOR) 80 MG tablet TAKE 1 TABLET BY MOUTH EVERY DAY 90 tablet 3   Cholecalciferol (VITAMIN D3) 50 MCG (2000 UT) TABS Take 2,000 Units by mouth daily.     clopidogrel (PLAVIX) 75 MG tablet TAKE 1 TABLET BY MOUTH EVERY DAY 90 tablet 3   glipiZIDE (GLUCOTROL XL) 5 MG 24 hr tablet TAKE 1 TABLET BY MOUTH EVERY DAY WITH BREAKFAST 90 tablet 3   hydrocortisone 2.5 % cream APPLY TOPICALLY 3 TIMES DAILY AS NEEDED. 28.35 g 3   ketoconazole (NIZORAL) 2 % shampoo APPLY TO 2 TO 3 TIMES A WEEK. LEAVE ON FOR 5MINS 120 mL 6   lisinopril (ZESTRIL) 40 MG tablet Take 1 tablet (40 mg total) by mouth daily. 90 tablet 3   metFORMIN (GLUCOPHAGE-XR) 500 MG 24 hr tablet Take 2 tablets (1,000 mg total) by mouth daily with breakfast. 180 tablet 3   mometasone (ELOCON) 0.1 % cream Apply 1 application topically 2 (two) times daily as needed (skin). 45 g 1   ONETOUCH DELICA LANCETS FINE MISC Use 1 daily to obtain blood sample Dx Code E11.21 100 each 3   ONETOUCH VERIO test strip USE 1 STRIP DAILY TO CHECK BLOOD SUGAR DX CODE E11.21 100 strip 3   No current facility-administered medications on file prior to visit.    Allergies  Allergen Reactions   Poison Ivy Extract Rash    Past Medical History:   Diagnosis Date   Cataract    in both eyes    had cataracts surgically removed   Complication of anesthesia    reports during prostate bx year ago , his BP "dropped real low" and he had to stay overnight after what was suposed to be an amulatory surgery    CVA (cerebral infarction)    Diabetes mellitus    ED (erectile dysfunction)    Elevated PSA    Frequency of urination    GERD (gastroesophageal reflux disease)    History of nephrolithiasis    Hyperlipidemia    Hypertension    Lung nodule    Psoriasis    PSVT (paroxysmal supraventricular tachycardia)    Stroke ([place])    2015   Stroke ([place]) 08/2017   reports no lasting deficits since stroke, endorses that he feels back to regular self since before stroke     Past Surgical History:  Procedure Laterality Date   CATARACT EXTRACTION W/ INTRAOCULAR LENS IMPLANT Right 03/30/2012   Dr Kathrin Penner   COLONOSCOPY  09/07/2020   2019   ELECTROPHYSIOLOGIC STUDY N/A 09/24/2014   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   NM MYOVIEW LTD     normal EF 56% 03/08  PARATHYROIDECTOMY Left 02/28/2018   Procedure: LEFT INFERIOR PARATHYROIDECTOMY;  Surgeon: Armandina Gemma, MD;  Location: [facility];  Service: General;  Laterality: Left;   POLYPECTOMY     PROSTATE BIOPSY  01/05/2012   Procedure: BIOPSY TRANSRECTAL ULTRASONIC PROSTATE (TUBP);  Surgeon: [name], MD;  Location: [facility];  Service: Urology;  Laterality: N/A;   PROSTATE BIOPSY N/A 10/14/2018   Procedure: BIOPSY TRANSRECTAL ULTRASONIC PROSTATE (TUBP);  Surgeon: Raynelle Bring, MD;  Location: [facility];  Service: Urology;  Laterality: N/A;   TEE WITHOUT CARDIOVERSION N/A 05/21/2014   Procedure: TRANSESOPHAGEAL ECHOCARDIOGRAM (TEE);  Surgeon: Jerline Pain, MD;  Location: [facility] ENDOSCOPY;  Service: Cardiovascular;  Laterality: N/A;   TONSILLECTOMY  1960    Family History  Problem Relation Age of Onset   Diabetes Mother    Breast cancer Mother    Colon polyps Brother    Heart disease  Neg Hx    Colon cancer Neg Hx    Esophageal cancer Neg Hx    Rectal cancer Neg Hx    Stomach cancer Neg Hx     Social History   Socioeconomic History   Marital status: Married    Spouse name: Peter Congo   Number of children: 2   Years of education: HS   Highest education level: Not on file  Occupational History   Occupation: Architect- paving    Comment: Retired  Tobacco Use   Smoking status: Former    Packs/day: 1.00    Years: 5.00    Total pack years: 5.00    Types: Cigarettes    Passive exposure: Past (as a child)   Smokeless tobacco: Current    Types: Chew   Tobacco comments:    QUIT SMOKING CIGARETTES 50 YRS AGO--  CHEWED TOBACCO FOR 40 YRS  Vaping Use   Vaping Use: Never used  Substance and Sexual Activity   Alcohol use: Not Currently    Alcohol/week: 0.0 standard drinks of alcohol   Drug use: No   Sexual activity: Not Currently  Other Topics Concern   Not on file  Social History Narrative   Has living will   Requests wife as health care POA. Children would be alternate   Would accept resuscitation attempts   Not sure about tube feeds      Lives at home with his wife   Right handed    Caffeine: occasional    Social Determinants of Health   Financial Resource Strain: Not on file  Food Insecurity: Not on file  Transportation Needs: Not on file  Physical Activity: Not on file  Stress: Not on file  Social Connections: Not on file  Intimate Partner Violence: Not on file   Review of Systems No fever Not sick No pain in area     Objective:   Physical Exam Constitutional:      Appearance: Normal appearance.  Skin:    Comments: Superficial ulcer on volar proximal right forearm No inflammation  Neurological:     Mental Status: He is alert.            Assessment & Plan:

## 2022-01-11 DIAGNOSIS — R972 Elevated prostate specific antigen [PSA]: Secondary | ICD-10-CM | POA: Diagnosis not present

## 2022-01-18 DIAGNOSIS — N401 Enlarged prostate with lower urinary tract symptoms: Secondary | ICD-10-CM | POA: Diagnosis not present

## 2022-01-18 DIAGNOSIS — R972 Elevated prostate specific antigen [PSA]: Secondary | ICD-10-CM | POA: Diagnosis not present

## 2022-01-18 DIAGNOSIS — N3941 Urge incontinence: Secondary | ICD-10-CM | POA: Diagnosis not present

## 2022-02-08 DIAGNOSIS — H35373 Puckering of macula, bilateral: Secondary | ICD-10-CM | POA: Diagnosis not present

## 2022-02-08 DIAGNOSIS — H524 Presbyopia: Secondary | ICD-10-CM | POA: Diagnosis not present

## 2022-02-08 DIAGNOSIS — H43813 Vitreous degeneration, bilateral: Secondary | ICD-10-CM | POA: Diagnosis not present

## 2022-02-08 DIAGNOSIS — H52203 Unspecified astigmatism, bilateral: Secondary | ICD-10-CM | POA: Diagnosis not present

## 2022-02-08 DIAGNOSIS — Z961 Presence of intraocular lens: Secondary | ICD-10-CM | POA: Diagnosis not present

## 2022-02-08 DIAGNOSIS — E113293 Type 2 diabetes mellitus with mild nonproliferative diabetic retinopathy without macular edema, bilateral: Secondary | ICD-10-CM | POA: Diagnosis not present

## 2022-02-08 LAB — HM DIABETES EYE EXAM

## 2022-03-08 DIAGNOSIS — I2510 Atherosclerotic heart disease of native coronary artery without angina pectoris: Secondary | ICD-10-CM | POA: Diagnosis not present

## 2022-03-08 DIAGNOSIS — I129 Hypertensive chronic kidney disease with stage 1 through stage 4 chronic kidney disease, or unspecified chronic kidney disease: Secondary | ICD-10-CM | POA: Diagnosis not present

## 2022-03-08 DIAGNOSIS — D6869 Other thrombophilia: Secondary | ICD-10-CM | POA: Diagnosis not present

## 2022-03-08 DIAGNOSIS — E1142 Type 2 diabetes mellitus with diabetic polyneuropathy: Secondary | ICD-10-CM | POA: Diagnosis not present

## 2022-03-08 DIAGNOSIS — L409 Psoriasis, unspecified: Secondary | ICD-10-CM | POA: Diagnosis not present

## 2022-03-08 DIAGNOSIS — E785 Hyperlipidemia, unspecified: Secondary | ICD-10-CM | POA: Diagnosis not present

## 2022-03-08 DIAGNOSIS — R32 Unspecified urinary incontinence: Secondary | ICD-10-CM | POA: Diagnosis not present

## 2022-03-08 DIAGNOSIS — N3941 Urge incontinence: Secondary | ICD-10-CM | POA: Diagnosis not present

## 2022-03-08 DIAGNOSIS — I4891 Unspecified atrial fibrillation: Secondary | ICD-10-CM | POA: Diagnosis not present

## 2022-03-08 DIAGNOSIS — I252 Old myocardial infarction: Secondary | ICD-10-CM | POA: Diagnosis not present

## 2022-03-08 DIAGNOSIS — N400 Enlarged prostate without lower urinary tract symptoms: Secondary | ICD-10-CM | POA: Diagnosis not present

## 2022-03-08 DIAGNOSIS — M545 Low back pain, unspecified: Secondary | ICD-10-CM | POA: Diagnosis not present

## 2022-03-08 DIAGNOSIS — N529 Male erectile dysfunction, unspecified: Secondary | ICD-10-CM | POA: Diagnosis not present

## 2022-03-08 DIAGNOSIS — Z008 Encounter for other general examination: Secondary | ICD-10-CM | POA: Diagnosis not present

## 2022-03-08 DIAGNOSIS — N189 Chronic kidney disease, unspecified: Secondary | ICD-10-CM | POA: Diagnosis not present

## 2022-03-20 DIAGNOSIS — W230XXA Caught, crushed, jammed, or pinched between moving objects, initial encounter: Secondary | ICD-10-CM | POA: Diagnosis not present

## 2022-03-20 DIAGNOSIS — Z23 Encounter for immunization: Secondary | ICD-10-CM | POA: Diagnosis not present

## 2022-03-20 DIAGNOSIS — S62664B Nondisplaced fracture of distal phalanx of right ring finger, initial encounter for open fracture: Secondary | ICD-10-CM | POA: Diagnosis not present

## 2022-03-20 DIAGNOSIS — S62634A Displaced fracture of distal phalanx of right ring finger, initial encounter for closed fracture: Secondary | ICD-10-CM | POA: Diagnosis not present

## 2022-03-26 DIAGNOSIS — S6991XA Unspecified injury of right wrist, hand and finger(s), initial encounter: Secondary | ICD-10-CM | POA: Diagnosis not present

## 2022-03-26 DIAGNOSIS — S62664B Nondisplaced fracture of distal phalanx of right ring finger, initial encounter for open fracture: Secondary | ICD-10-CM | POA: Diagnosis not present

## 2022-03-26 DIAGNOSIS — X58XXXA Exposure to other specified factors, initial encounter: Secondary | ICD-10-CM | POA: Diagnosis not present

## 2022-03-28 ENCOUNTER — Ambulatory Visit (INDEPENDENT_AMBULATORY_CARE_PROVIDER_SITE_OTHER): Payer: Medicare HMO | Admitting: Internal Medicine

## 2022-03-28 ENCOUNTER — Encounter: Payer: Self-pay | Admitting: Internal Medicine

## 2022-03-28 VITALS — BP 122/80 | HR 60 | Temp 97.3°F | Ht 68.0 in | Wt 157.0 lb

## 2022-03-28 DIAGNOSIS — E1121 Type 2 diabetes mellitus with diabetic nephropathy: Secondary | ICD-10-CM

## 2022-03-28 DIAGNOSIS — N1832 Chronic kidney disease, stage 3b: Secondary | ICD-10-CM | POA: Diagnosis not present

## 2022-03-28 DIAGNOSIS — I2510 Atherosclerotic heart disease of native coronary artery without angina pectoris: Secondary | ICD-10-CM

## 2022-03-28 LAB — POCT GLYCOSYLATED HEMOGLOBIN (HGB A1C): Hemoglobin A1C: 8.9 % — AB (ref 4.0–5.6)

## 2022-03-28 MED ORDER — EMPAGLIFLOZIN 10 MG PO TABS: 10.0000 mg | ORAL_TABLET | Freq: Every day | ORAL | 11 refills | Status: DC

## 2022-03-28 NOTE — Assessment & Plan Note (Signed)
No symptoms on the lisinopril, atorvastatin 80, asa 81, plavix 75

## 2022-03-28 NOTE — Assessment & Plan Note (Signed)
Lab Results  Component Value Date   HGBA1C 8.9 (A) 03/28/2022   Control markedly worse Discussed that this is mostly due to dietary indiscretion---discussed bringing it back in line Will add jardiance 10--to the metformin 1000 daily and glipizide 5 daily

## 2022-03-28 NOTE — Assessment & Plan Note (Signed)
Continues on the lisinopril

## 2022-03-28 NOTE — Progress Notes (Signed)
Subjective:    Patient ID: Tony Jackson., male    DOB: 06/22/1940, 82 y.o.   MRN: [id number]  HPI Here for follow up of diabetes With wife  Doing okay Not being careful with eating---wife notes he is eating a lot of junk Sporadic with checking sugars---doesn't remember the numbers Has been urinating more--barely makes it No exercise---tries to walk on his property Can be very slow though  No chest pain No SOB Feet numb and some pain  Current Outpatient Medications on File Prior to Visit  Medication Sig Dispense Refill   amLODipine (NORVASC) 10 MG tablet TAKE 1 TABLET BY MOUTH EVERY DAY 90 tablet 3   aspirin EC 81 MG tablet Take 81 mg by mouth daily.     atorvastatin (LIPITOR) 80 MG tablet TAKE 1 TABLET BY MOUTH EVERY DAY 90 tablet 3   cephALEXin (KEFLEX) 500 MG capsule Take 500 mg by mouth 4 (four) times daily.     Cholecalciferol (VITAMIN D3) 50 MCG (2000 UT) TABS Take 2,000 Units by mouth daily.     clopidogrel (PLAVIX) 75 MG tablet TAKE 1 TABLET BY MOUTH EVERY DAY 90 tablet 3   glipiZIDE (GLUCOTROL XL) 5 MG 24 hr tablet TAKE 1 TABLET BY MOUTH EVERY DAY WITH BREAKFAST 90 tablet 3   hydrocortisone 2.5 % cream APPLY TOPICALLY 3 TIMES DAILY AS NEEDED. 28.35 g 3   ketoconazole (NIZORAL) 2 % shampoo APPLY TO 2 TO 3 TIMES A WEEK. LEAVE ON FOR 5MINS 120 mL 6   lisinopril (ZESTRIL) 40 MG tablet Take 1 tablet (40 mg total) by mouth daily. 90 tablet 3   metFORMIN (GLUCOPHAGE-XR) 500 MG 24 hr tablet Take 2 tablets (1,000 mg total) by mouth daily with breakfast. 180 tablet 3   mometasone (ELOCON) 0.1 % cream Apply 1 application topically 2 (two) times daily as needed (skin). 45 g 1   ONETOUCH DELICA LANCETS FINE MISC Use 1 daily to obtain blood sample Dx Code E11.21 100 each 3   ONETOUCH VERIO test strip USE 1 STRIP DAILY TO CHECK BLOOD SUGAR DX CODE E11.21 100 strip 3   No current facility-administered medications on file prior to visit.    Allergies  Allergen Reactions    Poison Ivy Extract Rash    Past Medical History:  Diagnosis Date   Cataract    in both eyes    had cataracts surgically removed   Complication of anesthesia    reports during prostate bx year ago , his BP "dropped real low" and he had to stay overnight after what was suposed to be an amulatory surgery    CVA (cerebral infarction)    Diabetes mellitus    ED (erectile dysfunction)    Elevated PSA    Frequency of urination    GERD (gastroesophageal reflux disease)    History of nephrolithiasis    Hyperlipidemia    Hypertension    Lung nodule    Psoriasis    PSVT (paroxysmal supraventricular tachycardia)    Stroke ([place])    2015   Stroke ([place]) 08/2017   reports no lasting deficits since stroke, endorses that he feels back to regular self since before stroke     Past Surgical History:  Procedure Laterality Date   CATARACT EXTRACTION W/ INTRAOCULAR LENS IMPLANT Right 03/30/2012   Dr Kathrin Penner   COLONOSCOPY  09/07/2020   2019   ELECTROPHYSIOLOGIC STUDY N/A 09/24/2014   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   NM MYOVIEW LTD  normal EF 56% 03/08   PARATHYROIDECTOMY Left 02/28/2018   Procedure: LEFT INFERIOR PARATHYROIDECTOMY;  Surgeon: Armandina Gemma, MD;  Location: [facility];  Service: General;  Laterality: Left;   POLYPECTOMY     PROSTATE BIOPSY  01/05/2012   Procedure: BIOPSY TRANSRECTAL ULTRASONIC PROSTATE (TUBP);  Surgeon: [name], MD;  [facility];  Service: Urology;  Laterality: N/A;   PROSTATE BIOPSY N/A 10/14/2018   Procedure: BIOPSY TRANSRECTAL ULTRASONIC PROSTATE (TUBP);  Surgeon: Raynelle Bring, MD;  Location: [facility];  Service: Urology;  Laterality: N/A;   TEE WITHOUT CARDIOVERSION N/A 05/21/2014   Procedure: TRANSESOPHAGEAL ECHOCARDIOGRAM (TEE);  Surgeon: Jerline Pain, MD;  Location: [facility] END[facility]COPY;  Service: Cardiovascular;  Laterality: N/A;   TONSILLECTOMY  1960    Family History  Problem Relation Age of Onset   Diabetes Mother    Breast cancer  Mother    Colon polyps Brother    Heart disease Neg Hx    Colon cancer Neg Hx    Esophageal cancer Neg Hx    Rectal cancer Neg Hx    Stomach cancer Neg Hx     Social History   Socioeconomic History   Marital status: Married    Spouse name: Peter Congo   Number of children: 2   Years of education: HS   Highest education level: Not on file  Occupational History   Occupation: Architect- paving    Comment: Retired  Tobacco Use   Smoking status: Former    Packs/day: 1.00    Years: 5.00    Total pack years: 5.00    Types: Cigarettes    Passive exposure: Past (as a child)   Smokeless tobacco: Current    Types: Chew   Tobacco comments:    QUIT SMOKING CIGARETTES 50 YRS AGO--  CHEWED TOBACCO FOR 40 YRS  Vaping Use   Vaping Use: Never used  Substance and Sexual Activity   Alcohol use: Not Currently    Alcohol/week: 0.0 standard drinks of alcohol   Drug use: No   Sexual activity: Not Currently  Other Topics Concern   Not on file  Social History Narrative   Has living will   Requests wife as health care POA. Children would be alternate   Would accept resuscitation attempts   Not sure about tube feeds      Lives at home with his wife   Right handed    Caffeine: occasional    Social Determinants of Health   Financial Resource Strain: Not on file  Food Insecurity: Not on file  Transportation Needs: Not on file  Physical Activity: Not on file  Stress: Not on file  Social Connections: Not on file  Intimate Partner Violence: Not on file   Review of Systems Sleeps okay Appetite is fine Last GFR 43     Objective:   Physical Exam Constitutional:      Appearance: Normal appearance.  Cardiovascular:     Rate and Rhythm: Normal rate and regular rhythm.     Heart sounds: No murmur heard.    No gallop.     Comments: Faint pedal pulses Pulmonary:     Effort: Pulmonary effort is normal.     Breath sounds: Normal breath sounds. No wheezing or rales.  Musculoskeletal:      Right lower leg: No edema.     Left lower leg: No edema.  Skin:    Comments: No foot lesions  Neurological:     Mental Status: He is alert.  Assessment & Plan:

## 2022-03-30 DIAGNOSIS — S67194A Crushing injury of right ring finger, initial encounter: Secondary | ICD-10-CM | POA: Diagnosis not present

## 2022-03-30 DIAGNOSIS — M79644 Pain in right finger(s): Secondary | ICD-10-CM | POA: Diagnosis not present

## 2022-03-30 DIAGNOSIS — S62644A Nondisplaced fracture of proximal phalanx of right ring finger, initial encounter for closed fracture: Secondary | ICD-10-CM | POA: Diagnosis not present

## 2022-03-30 DIAGNOSIS — T148XXA Other injury of unspecified body region, initial encounter: Secondary | ICD-10-CM | POA: Diagnosis not present

## 2022-03-30 DIAGNOSIS — M25641 Stiffness of right hand, not elsewhere classified: Secondary | ICD-10-CM | POA: Diagnosis not present

## 2022-04-10 ENCOUNTER — Other Ambulatory Visit: Payer: Self-pay | Admitting: Internal Medicine

## 2022-04-13 DIAGNOSIS — S67194A Crushing injury of right ring finger, initial encounter: Secondary | ICD-10-CM | POA: Diagnosis not present

## 2022-04-17 ENCOUNTER — Other Ambulatory Visit: Payer: Self-pay | Admitting: Internal Medicine

## 2022-05-09 ENCOUNTER — Other Ambulatory Visit: Payer: Self-pay | Admitting: Internal Medicine

## 2022-05-31 ENCOUNTER — Other Ambulatory Visit: Payer: Self-pay | Admitting: Internal Medicine

## 2022-06-26 ENCOUNTER — Encounter: Payer: Self-pay | Admitting: Internal Medicine

## 2022-06-26 ENCOUNTER — Encounter: Payer: Self-pay | Admitting: *Deleted

## 2022-06-26 ENCOUNTER — Ambulatory Visit (INDEPENDENT_AMBULATORY_CARE_PROVIDER_SITE_OTHER): Payer: Medicare HMO | Admitting: Internal Medicine

## 2022-06-26 VITALS — BP 110/64 | HR 72 | Temp 97.2°F | Ht 68.0 in | Wt 157.0 lb

## 2022-06-26 DIAGNOSIS — Z7984 Long term (current) use of oral hypoglycemic drugs: Secondary | ICD-10-CM | POA: Diagnosis not present

## 2022-06-26 DIAGNOSIS — R32 Unspecified urinary incontinence: Secondary | ICD-10-CM | POA: Insufficient documentation

## 2022-06-26 DIAGNOSIS — G3184 Mild cognitive impairment, so stated: Secondary | ICD-10-CM

## 2022-06-26 DIAGNOSIS — E1121 Type 2 diabetes mellitus with diabetic nephropathy: Secondary | ICD-10-CM

## 2022-06-26 LAB — COMPREHENSIVE METABOLIC PANEL
ALT: 19 U/L (ref 0–53)
AST: 14 U/L (ref 0–37)
Albumin: 4 g/dL (ref 3.5–5.2)
Alkaline Phosphatase: 60 U/L (ref 39–117)
BUN: 36 mg/dL — ABNORMAL HIGH (ref 6–23)
CO2: 24 mEq/L (ref 19–32)
Calcium: 9 mg/dL (ref 8.4–10.5)
Chloride: 104 mEq/L (ref 96–112)
Creatinine, Ser: 1.9 mg/dL — ABNORMAL HIGH (ref 0.40–1.50)
GFR: 32.67 mL/min — ABNORMAL LOW (ref >60.00)
Glucose, Bld: 402 mg/dL — ABNORMAL HIGH (ref 70–99)
Potassium: 5.1 mEq/L (ref 3.5–5.1)
Sodium: 139 mEq/L (ref 135–145)
Total Bilirubin: 0.6 mg/dL (ref 0.2–1.2)
Total Protein: 6 g/dL (ref 6.0–8.3)

## 2022-06-26 LAB — CBC
HCT: 39.2 % (ref 39.0–52.0)
Hemoglobin: 13.3 g/dL (ref 13.0–17.0)
MCHC: 33.8 g/dL (ref 30.0–36.0)
MCV: 92.2 fl (ref 78.0–100.0)
Platelets: 225 10*3/uL (ref 150.0–400.0)
RBC: 4.25 Mil/uL (ref 4.22–5.81)
RDW: 13.3 % (ref 11.5–15.5)
WBC: 12.6 10*3/uL — ABNORMAL HIGH (ref 4.0–10.5)

## 2022-06-26 LAB — POCT GLYCOSYLATED HEMOGLOBIN (HGB A1C): Hemoglobin A1C: 11.6 % — AB (ref 4.0–5.6)

## 2022-06-26 LAB — VITAMIN B12: Vitamin B-12: 274 pg/mL (ref 211–911)

## 2022-06-26 LAB — TSH: TSH: 1.93 u[IU]/mL (ref 0.35–5.50)

## 2022-06-26 MED ORDER — ONETOUCH VERIO W/DEVICE KIT: 1.0000 | PACK | Freq: Once | 0 refills | Status: AC

## 2022-06-26 MED ORDER — GLIPIZIDE ER 10 MG PO TB24: 10.0000 mg | ORAL_TABLET | Freq: Every day | ORAL | 3 refills | Status: AC

## 2022-06-26 NOTE — Assessment & Plan Note (Signed)
Now completely out of control Lab Results  Component Value Date   HGBA1C 11.6 (A) 06/26/2022   He seems to lack insight into his eating---could be cognitive related Much worse on the jardiance 10--though I doubt that could be the problem Metformin 1000 daiy, glipizide 5mg ---will increase to 10mg 

## 2022-06-26 NOTE — Assessment & Plan Note (Signed)
Now seems to be worse--lack of insight Will check labs May need MRI and neurology evaluation Will recheck 1 month

## 2022-06-26 NOTE — Progress Notes (Signed)
Subjective:    Patient ID: Tony Jackson., male    DOB: 1941-01-16, 82 y.o.   MRN: [id number]  HPI Here for follow up of poorly controlled diabetes With wife  Still eats a lot of sweets Buys apple pie, cupcakes, etc They go out a lot---fast food, [employer]  No sweetened drinks  Also having more trouble with his memory Wife notes worsening This morning--he forgot how to check his blood sugar  Voiding a lot Having incontinence and has to change underwear frequently  Current Outpatient Medications on File Prior to Visit  Medication Sig Dispense Refill   amLODipine (NORVASC) 10 MG tablet TAKE 1 TABLET BY MOUTH EVERY DAY 90 tablet 3   aspirin EC 81 MG tablet Take 81 mg by mouth daily.     atorvastatin (LIPITOR) 80 MG tablet TAKE 1 TABLET BY MOUTH EVERY DAY 90 tablet 3   Cholecalciferol (VITAMIN D3) 50 MCG (2000 UT) TABS Take 2,000 Units by mouth daily.     clopidogrel (PLAVIX) 75 MG tablet TAKE 1 TABLET BY MOUTH EVERY DAY 90 tablet 3   empagliflozin (JARDIANCE) 10 MG TABS tablet Take 1 tablet (10 mg total) by mouth daily before breakfast. 30 tablet 11   glipiZIDE (GLUCOTROL XL) 5 MG 24 hr tablet TAKE 1 TABLET BY MOUTH EVERY DAY WITH BREAKFAST 90 tablet 3   hydrocortisone 2.5 % cream APPLY TOPICALLY 3 TIMES DAILY AS NEEDED. 28.35 g 3   ketoconazole (NIZORAL) 2 % shampoo APPLY TO 2 TO 3 TIMES A WEEK. LEAVE ON FOR 360 mL 2   lisinopril (ZESTRIL) 40 MG tablet Take 1 tablet (40 mg total) by mouth daily. 90 tablet 3   metFORMIN (GLUCOPHAGE-XR) 500 MG 24 hr tablet Take 2 tablets (1,000 mg total) by mouth daily with breakfast. 180 tablet 3   mometasone (ELOCON) 0.1 % cream APPLY 1 APPLICATION TOPICALLY 2 (TWO) TIMES DAILY AS NEEDED (SKIN). 45 g 1   ONETOUCH DELICA LANCETS FINE MISC Use 1 daily to obtain blood sample Dx Code E11.21 100 each 3   ONETOUCH VERIO test strip USE 1 STRIP DAILY TO CHECK BLOOD SUGAR DX CODE E11.21 100 strip 3   No current facility-administered medications on  file prior to visit.    Allergies  Allergen Reactions   Poison Ivy Extract Rash    Past Medical History:  Diagnosis Date   Cataract    in both eyes    had cataracts surgically removed   Complication of anesthesia    reports during prostate bx year ago , his BP "dropped real low" and he had to stay overnight after what was suposed to be an amulatory surgery    CVA (cerebral infarction)    Diabetes mellitus    ED (erectile dysfunction)    Elevated PSA    Frequency of urination    GERD (gastroesophageal reflux disease)    History of nephrolithiasis    Hyperlipidemia    Hypertension    Lung nodule    Psoriasis    PSVT (paroxysmal supraventricular tachycardia)    Stroke (HCC)    2015   Stroke (HCC) 08/2017   reports no lasting deficits since stroke, endorses that he feels back to regular self since before stroke     Past Surgical History:  Procedure Laterality Date   CATARACT EXTRACTION W/ INTRAOCULAR LENS IMPLANT Right 03/30/2012   Dr Dagoberto Ligas   COLONOSCOPY  09/07/2020   2019   ELECTROPHYSIOLOGIC STUDY N/A 09/24/2014   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 Frame   NM MYOVIEW LTD     normal EF 56% 03/08   PARATHYROIDECTOMY Left 02/28/2018   Procedure: LEFT INFERIOR PARATHYROIDECTOMY;  Surgeon: Darnell Level, MD;  Location: [facility];  Service: General;  Laterality: Left;   POLYPECTOMY     PROSTATE BIOPSY  01/05/2012   Procedure: BIOPSY TRANSRECTAL ULTRASONIC PROSTATE (TUBP);  Surgeon: Crecencio Mc, MD;  Location: [facility];  Service: Urology;  Laterality: N/A;   PROSTATE BIOPSY N/A 10/14/2018   Procedure: BIOPSY TRANSRECTAL ULTRASONIC PROSTATE (TUBP);  Surgeon: Heloise Purpura, MD;  Location: [facility];  Service: Urology;  Laterality: N/A;   TEE WITHOUT CARDIOVERSION N/A 05/21/2014   Procedure: TRANSESOPHAGEAL ECHOCARDIOGRAM (TEE);  Surgeon: Jake Bathe, MD;  Location: [facility] ENDOSCOPY;  Service: Cardiovascular;  Laterality: N/A;   TONSILLECTOMY  1960    Family History   Problem Relation Age of Onset   Diabetes Mother    Breast cancer Mother    Colon polyps Brother    Heart disease Neg Hx    Colon cancer Neg Hx    Esophageal cancer Neg Hx    Rectal cancer Neg Hx    Stomach cancer Neg Hx     Social History   Socioeconomic History   Marital status: Married    Spouse name: Malachi Bonds   Number of children: 2   Years of education: HS   Highest education level: Not on file  Occupational History   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[employer]    Comment: Retired  Tobacco Use   Smoking status: Former    Packs/day: 1.00    Years: 5.00    Additional pack years: 0.00    Total pack years: 5.00    Types: Cigarettes    Passive exposure: Past (as a child)   Smokeless tobacco: Current    Types: Chew   Tobacco comments:    QUIT SMOKING CIGARETTES 50 YRS AGO--  CHEWED TOBACCO FOR 40 YRS  Vaping Use   Vaping Use: Never used  Substance and Sexual Activity   Alcohol use: Not Currently    Alcohol/week: 0.0 standard drinks of alcohol   Drug use: No   Sexual activity: Not Currently  Other Topics Concern   Not on file  Social History Narrative   Has living will   Requests wife as health care POA. Children would be alternate   Would accept resuscitation attempts   Not sure about tube feeds      Lives at home with his wife   Right handed    Caffeine: occasional    Social Determinants of Health   Financial Resource Strain: Not on file  Food Insecurity: Not on file  Transportation Needs: Not on file  Physical Activity: Not on file  Stress: Not on file  Social Connections: Not on file  Intimate Partner Violence: Not on file   Review of Systems Weight is stable Sleeps okay Not feeling depressed    Objective:   Physical Exam Constitutional:      Appearance: Normal appearance.  Cardiovascular:     Rate and Rhythm: Normal rate and regular rhythm.     Heart sounds: No murmur heard.    No gallop.  Pulmonary:     Effort: Pulmonary effort is normal.      Breath sounds: Normal breath sounds. No wheezing or rales.  Abdominal:     Palpations: Abdomen is soft.     Tenderness: There is no abdominal tenderness.  Musculoskeletal:     Cervical back: Neck supple.  Right lower leg: No edema.     Left lower leg: No edema.  Lymphadenopathy:     Cervical: No cervical adenopathy.  Neurological:     Mental Status: He is alert.  Psychiatric:     Comments: Seems to have a lack of insight and judgement            Assessment & Plan:

## 2022-06-26 NOTE — Assessment & Plan Note (Addendum)
Likely partially due to the diabetes and jardiance Discussed it should improve if he does better with his eating Could be partially due to worsening cognitive status

## 2022-06-26 NOTE — Patient Instructions (Signed)
Please stop all your sweets---pies, cakes, etc Limit carbs---pasta, bread, rice Double the glipizide to 10mg  daily--I have sent a new prescription

## 2022-07-17 ENCOUNTER — Other Ambulatory Visit: Payer: Self-pay | Admitting: Internal Medicine

## 2022-07-26 DIAGNOSIS — R972 Elevated prostate specific antigen [PSA]: Secondary | ICD-10-CM | POA: Diagnosis not present

## 2022-07-30 ENCOUNTER — Other Ambulatory Visit: Payer: Self-pay | Admitting: Internal Medicine

## 2022-07-31 ENCOUNTER — Encounter: Payer: Self-pay | Admitting: Internal Medicine

## 2022-07-31 ENCOUNTER — Telehealth: Payer: Self-pay | Admitting: Internal Medicine

## 2022-07-31 ENCOUNTER — Ambulatory Visit (INDEPENDENT_AMBULATORY_CARE_PROVIDER_SITE_OTHER): Payer: Medicare HMO | Admitting: Internal Medicine

## 2022-07-31 VITALS — BP 122/70 | HR 60 | Temp 97.4°F | Ht 68.0 in | Wt 158.0 lb

## 2022-07-31 DIAGNOSIS — E1122 Type 2 diabetes mellitus with diabetic chronic kidney disease: Secondary | ICD-10-CM | POA: Diagnosis not present

## 2022-07-31 DIAGNOSIS — G3184 Mild cognitive impairment, so stated: Secondary | ICD-10-CM

## 2022-07-31 DIAGNOSIS — N183 Chronic kidney disease, stage 3 unspecified: Secondary | ICD-10-CM

## 2022-07-31 DIAGNOSIS — Z7984 Long term (current) use of oral hypoglycemic drugs: Secondary | ICD-10-CM | POA: Diagnosis not present

## 2022-07-31 MED ORDER — METFORMIN HCL ER 500 MG PO TB24: 1000.0000 mg | ORAL_TABLET | Freq: Every day | ORAL | 3 refills | Status: DC

## 2022-07-31 NOTE — Assessment & Plan Note (Signed)
Seems to be worsening Not consistent with AD---could be vascular Will refer to neurology Check brain MRI

## 2022-07-31 NOTE — Assessment & Plan Note (Signed)
Seems to be eating some better Too soon to recheck A1c Does have appt with endocrine

## 2022-07-31 NOTE — Progress Notes (Signed)
Subjective:    Patient ID: Tony Jackson., male    DOB: 10/25/1940, 82 y.o.   MRN: [id number]  HPI Here with wife for follow up of diabetes and memory issues  Does have appointment at [facility] for endocrinology Is better with eating---substituted some bars for sweets (still high carb) Checking sugars several times a week 143-348 (that was just after last visit) Mostly under 200 of late  Ongoing memory issues Can't remember the date without newspaper He is aware of the issues  Current Outpatient Medications on File Prior to Visit  Medication Sig Dispense Refill   amLODipine (NORVASC) 10 MG tablet TAKE 1 TABLET BY MOUTH EVERY DAY 90 tablet 3   aspirin EC 81 MG tablet Take 81 mg by mouth daily.     atorvastatin (LIPITOR) 80 MG tablet TAKE 1 TABLET BY MOUTH EVERY DAY 90 tablet 3   Cholecalciferol (VITAMIN D3) 50 MCG (2000 UT) TABS Take 2,000 Units by mouth daily.     clopidogrel (PLAVIX) 75 MG tablet TAKE 1 TABLET BY MOUTH EVERY DAY 90 tablet 3   empagliflozin (JARDIANCE) 10 MG TABS tablet Take 1 tablet (10 mg total) by mouth daily before breakfast. 30 tablet 11   glipiZIDE (GLUCOTROL XL) 10 MG 24 hr tablet Take 1 tablet (10 mg total) by mouth daily with breakfast. 90 tablet 3   hydrocortisone 2.5 % cream APPLY TOPICALLY 3 TIMES DAILY AS NEEDED. 28.35 g 3   ketoconazole (NIZORAL) 2 % shampoo APPLY TO 2 TO 3 TIMES A WEEK. LEAVE ON FOR 360 mL 2   lisinopril (ZESTRIL) 40 MG tablet Take 1 tablet (40 mg total) by mouth daily. 90 tablet 3   metFORMIN (GLUCOPHAGE-XR) 500 MG 24 hr tablet Take 2 tablets (1,000 mg total) by mouth daily with breakfast. 180 tablet 3   mometasone (ELOCON) 0.1 % cream APPLY 1 APPLICATION TOPICALLY 2 (TWO) TIMES DAILY AS NEEDED (SKIN). 45 g 1   ONETOUCH DELICA LANCETS FINE MISC Use 1 daily to obtain blood sample Dx Code E11.21 100 each 3   ONETOUCH VERIO test strip USE 1 STRIP DAILY TO CHECK BLOOD SUGAR DX CODE E11.21 100 strip 3   No current  facility-administered medications on file prior to visit.    Allergies  Allergen Reactions   Poison Ivy Extract Rash    Past Medical History:  Diagnosis Date   Cataract    in both eyes    had cataracts surgically removed   Complication of anesthesia    reports during prostate bx year ago , his BP "dropped real low" and he had to stay overnight after what was suposed to be an amulatory surgery    CVA (cerebral infarction)    Diabetes mellitus    ED (erectile dysfunction)    Elevated PSA    Frequency of urination    GERD (gastroesophageal reflux disease)    History of nephrolithiasis    Hyperlipidemia    Hypertension    Lung nodule    Psoriasis    PSVT (paroxysmal supraventricular tachycardia)    Stroke (HCC)    2015   Stroke (HCC) 08/2017   reports no lasting deficits since stroke, endorses that he feels back to regular self since before stroke     Past Surgical History:  Procedure Laterality Date   CATARACT EXTRACTION W/ INTRAOCULAR LENS IMPLANT Right 03/30/2012   Dr Dagoberto Ligas   COLONOSCOPY  09/07/2020   2019   ELECTROPHYSIOLOGIC STUDY N/A 09/24/2014   AVNRT pathway  by Dr Johney Frame   NM MYOVIEW LTD     normal EF 56% 03/08   PARATHYROIDECTOMY Left 02/28/2018   Procedure: LEFT INFERIOR PARATHYROIDECTOMY;  Surgeon: Darnell Level, MD;  Location: [facility];  Service: General;  Laterality: Left;   POLYPECTOMY     PROSTATE BIOPSY  01/05/2012   Procedure: BIOPSY TRANSRECTAL ULTRASONIC PROSTATE (TUBP);  Surgeon: Crecencio Mc, MD;  Location: [facility];  Service: Urology;  Laterality: N/A;   PROSTATE BIOPSY N/A 10/14/2018   Procedure: BIOPSY TRANSRECTAL ULTRASONIC PROSTATE (TUBP);  Surgeon: Heloise Purpura, MD;  Location: [facility];  Service: Urology;  Laterality: N/A;   TEE WITHOUT CARDIOVERSION N/A 05/21/2014   Procedure: TRANSESOPHAGEAL ECHOCARDIOGRAM (TEE);  Surgeon: Jake Bathe, MD;  Location: [facility] ENDOSCOPY;  Service: Cardiovascular;  Laterality: N/A;    TONSILLECTOMY  1960    Family History  Problem Relation Age of Onset   Diabetes Mother    Breast cancer Mother    Colon polyps Brother    Heart disease Neg Hx    Colon cancer Neg Hx    Esophageal cancer Neg Hx    Rectal cancer Neg Hx    Stomach cancer Neg Hx     Social History   Socioeconomic History   Marital status: Married    Spouse name: Malachi Bonds   Number of children: 2   Years of education: HS   Highest education level: Not on file  Occupational History   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aving    Comment: Retired  Tobacco Use   Smoking status: Former    Packs/day: 1.00    Years: 5.00    Additional pack years: 0.00    Total pack years: 5.00    Types: Cigarettes    Passive exposure: Past (as a child)   Smokeless tobacco: Current    Types: Chew   Tobacco comments:    QUIT SMOKING CIGARETTES 50 YRS AGO--  CHEWED TOBACCO FOR 40 YRS  Vaping Use   Vaping Use: Never used  Substance and Sexual Activity   Alcohol use: Not Currently    Alcohol/week: 0.0 standard drinks of alcohol   Drug use: No   Sexual activity: Not Currently  Other Topics Concern   Not on file  Social History Narrative   Has living will   Requests wife as health care POA. Children would be alternate   Would accept resuscitation attempts   Not sure about tube feeds      Lives at home with his wife   Right handed    Caffeine: occasional    Social Determinants of Health   Financial Resource Strain: Not on file  Food Insecurity: Not on file  Transportation Needs: Not on file  Physical Activity: Not on file  Stress: Not on file  Social Connections: Not on file  Intimate Partner Violence: Not on file   Review of Systems Sleeps well Appetite is fine     Objective:   Physical Exam Constitutional:      Appearance: Normal appearance.  Neurological:     Mental Status: He is alert.  Psychiatric:     Comments: Aware of memory issues            Assessment & Plan:

## 2022-07-31 NOTE — Telephone Encounter (Signed)
Rx sent electronically.  

## 2022-07-31 NOTE — Telephone Encounter (Signed)
Prescription Request  07/31/2022  LOV: 07/31/2022  What is the name of the medication or equipment? metFORMIN (GLUCOPHAGE-XR) 500 MG 24 hr tablet   Have you contacted your pharmacy to request a refill? Yes   Which pharmacy would you like this sent to?  [id number] [place], [place] - [address] [place] [address] Phone: [phone] Fax: [phone]  Patient notified that their request is being sent to the clinical staff for review and that they should receive a response within 2 business days.   Please advise at Mobile [phone] (mobile)

## 2022-08-02 DIAGNOSIS — N3941 Urge incontinence: Secondary | ICD-10-CM | POA: Diagnosis not present

## 2022-08-02 DIAGNOSIS — N401 Enlarged prostate with lower urinary tract symptoms: Secondary | ICD-10-CM | POA: Diagnosis not present

## 2022-08-02 DIAGNOSIS — R972 Elevated prostate specific antigen [PSA]: Secondary | ICD-10-CM | POA: Diagnosis not present

## 2022-08-07 ENCOUNTER — Encounter: Payer: Self-pay | Admitting: Physician Assistant

## 2022-08-23 ENCOUNTER — Encounter: Payer: Self-pay | Admitting: Physician Assistant

## 2022-08-23 ENCOUNTER — Ambulatory Visit: Payer: Medicare HMO | Admitting: Physician Assistant

## 2022-08-23 ENCOUNTER — Ambulatory Visit: Payer: Medicare HMO

## 2022-08-23 VITALS — BP 134/84 | HR 66 | Resp 18 | Ht 68.0 in | Wt 154.0 lb

## 2022-08-23 DIAGNOSIS — R413 Other amnesia: Secondary | ICD-10-CM | POA: Diagnosis not present

## 2022-08-23 MED ORDER — DONEPEZIL HCL 5 MG PO TABS: 5.0000 mg | ORAL_TABLET | Freq: Every morning | ORAL | 11 refills | Status: DC

## 2022-08-23 NOTE — Patient Instructions (Addendum)
It was a pleasure to see you today at our office.   Recommendations:  MRI of the brain results to be sent here  Start Donepezil 5 mg daily. Side effects discussed   Follow up in 3 months   For psychiatric meds, mood meds: Please have your primary care physician manage these medications.  If you have any severe symptoms of a stroke, or other severe issues such as confusion,severe chills or fever, etc call 911 or go to the ER as you may need to be evaluated further   For assessment of decision of mental capacity and competency:  Call Dr. Erick Blinks, geriatric psychiatrist at [phone]  Counseling regarding caregiver distress, including caregiver depression, anxiety and issues regarding community resources, adult day care programs, adult living facilities, or memory care questions:  please contact your  Primary Doctor's Social Worker   Whom to call: Memory  decline, memory medications: Call our office [phone]    [email or web address]   RECOMMENDATIONS FOR ALL PATIENTS WITH MEMORY PROBLEMS: 1. Continue to exercise (Recommend 30 minutes of walking everyday, or 3 hours every week) 2. Increase social interactions - continue going to [place] and enjoy social gatherings with friends and family 3. Eat healthy, avoid fried foods and eat more fruits and vegetables 4. Maintain adequate blood pressure, blood sugar, and blood cholesterol level. Reducing the risk of stroke and cardiovascular disease also helps promoting better memory. 5. Avoid stressful situations. Live a simple life and avoid aggravations. Organize your time and prepare for the next day in anticipation. 6. Sleep well, avoid any interruptions of sleep and avoid any distractions in the bedroom that may interfere with adequate sleep quality 7. Avoid sugar, avoid sweets as there is a strong link between excessive sugar intake, diabetes, and cognitive impairment We discussed  the Mediterranean diet, which has been shown to help patients reduce the risk of progressive memory disorders and reduces cardiovascular risk. This includes eating fish, eat fruits and green leafy vegetables, nuts like almonds and hazelnuts, walnuts, and also use olive oil. Avoid fast foods and fried foods as much as possible. Avoid sweets and sugar as sugar use has been linked to worsening of memory function.  There is always a concern of gradual progression of memory problems. If this is the case, then we may need to adjust level of care according to patient needs. Support, both to the patient and caregiver, should then be put into place.      You have been referred for a neuropsychological evaluation (i.e., evaluation of memory and thinking abilities). Please bring someone with you to this appointment if possible, as it is helpful for the doctor to hear from both you and another adult who knows you well. Please bring eyeglasses and hearing aids if you wear them.    The evaluation will take approximately 3 hours and has two parts:   The first part is a clinical interview with the neuropsychologist (Dr. Milbert Coulter or Dr. Roseanne Reno). During the interview, the neuropsychologist will speak with you and the individual you brought to the appointment.    The second part of the evaluation is testing with the doctor's technician Annabelle Harman or Selena Batten). During the testing, the technician will ask you to remember different types of material, solve problems, and answer some questionnaires. Your family member will not be present for this portion of the evaluation.   Please note: We must reserve several hours of the neuropsychologist's time and the psychometrician's time for your evaluation appointment. As such, there  is a No-Show fee of $100. If you are unable to attend any of your appointments, please contact our office as soon as possible to reschedule.      DRIVING: Regarding driving, in patients with progressive memory  problems, driving will be impaired. We advise to have someone else do the driving if trouble finding directions or if minor accidents are reported. Independent driving assessment is available to determine safety of driving.   If you are interested in the driving assessment, you can contact the following:  [facility] [employer] [facility] in [place] [phone]  [facility] [phone]  [facility] [phone]  [facility] [phone] or [phone]   FALL PRECAUTIONS: Be cautious when walking. Scan the area for obstacles that may increase the risk of trips and falls. When getting up in the mornings, sit up at the edge of the bed for a few minutes before getting out of bed. Consider elevating the bed at the head end to avoid drop of blood pressure when getting up. Walk always in a well-lit room (use night lights in the walls). Avoid area rugs or power cords from appliances in the middle of the walkways. Use a [place] or a cane if necessary and consider physical therapy for balance exercise. Get your eyesight checked regularly.  FINANCIAL OVERSIGHT: Supervision, especially oversight when making financial decisions or transactions is also recommended.  HOME SAFETY: Consider the safety of the kitchen when operating appliances like stoves, microwave oven, and blender. Consider having supervision and share cooking responsibilities until no longer able to participate in those. Accidents with firearms and other hazards in the house should be identified and addressed as well.   ABILITY TO BE LEFT ALONE: If patient is unable to contact 911 operator, consider using LifeLine, or when the need is there, arrange for someone to stay with patients. Smoking is a fire hazard, consider supervision or cessation. Risk of wandering should be assessed by caregiver and if detected at any point, supervision and safe proof recommendations should be instituted.  MEDICATION SUPERVISION:  Inability to self-administer medication needs to be constantly addressed. Implement a mechanism to ensure safe administration of the medications.      Mediterranean Diet A Mediterranean diet refers to food and lifestyle choices that are based on the traditions of countries located on the [employer] [place]. This way of eating has been shown to help prevent certain conditions and improve outcomes for people who have chronic diseases, like kidney disease and heart disease. What are tips for following this plan? Lifestyle  Cook and eat meals together with your family, when possible. Drink enough fluid to keep your urine clear or pale yellow. Be physically active every day. This includes: Aerobic exercise like running or swimming. Leisure activities like gardening, walking, or housework. Get 7-8 hours of sleep each night. If recommended by your health care provider, drink red wine in moderation. This means 1 glass a day for nonpregnant women and 2 glasses a day for men. A glass of wine equals 5 oz (150 mL). Reading food labels  Check the serving size of packaged foods. For foods such as rice and pasta, the serving size refers to the amount of cooked product, not dry. Check the total fat in packaged foods. Avoid foods that have saturated fat or trans fats. Check the ingredients list for added sugars, such as corn syrup. Shopping  At the grocery store, buy most of your food from the areas near the walls of the store. This includes: Fresh  fruits and vegetables (produce). Grains, beans, nuts, and seeds. Some of these may be available in unpackaged forms or large amounts (in bulk). Fresh seafood. Poultry and eggs. Low-fat dairy products. Buy whole ingredients instead of prepackaged foods. Buy fresh fruits and vegetables in-season from local farmers markets. Buy frozen fruits and vegetables in resealable bags. If you do not have access to quality fresh seafood, buy precooked frozen shrimp or  canned fish, such as tuna, salmon, or sardines. Buy small amounts of raw or cooked vegetables, salads, or olives from the deli or salad bar at your store. Stock your pantry so you always have certain foods on hand, such as olive oil, canned tuna, canned tomatoes, rice, pasta, and beans. Cooking  Cook foods with extra-virgin olive oil instead of using butter or other vegetable oils. Have meat as a side dish, and have vegetables or grains as your main dish. This means having meat in small portions or adding small amounts of meat to foods like pasta or stew. Use beans or vegetables instead of meat in common dishes like chili or lasagna. Experiment with different cooking methods. Try roasting or broiling vegetables instead of steaming or sauteing them. Add frozen vegetables to soups, stews, pasta, or rice. Add nuts or seeds for added healthy fat at each meal. You can add these to yogurt, salads, or vegetable dishes. Marinate fish or vegetables using olive oil, lemon juice, garlic, and fresh herbs. Meal planning  Plan to eat 1 vegetarian meal one day each week. Try to work up to 2 vegetarian meals, if possible. Eat seafood 2 or more times a week. Have healthy snacks readily available, such as: Vegetable sticks with hummus. Greek yogurt. Fruit and nut trail mix. Eat balanced meals throughout the week. This includes: Fruit: 2-3 servings a day Vegetables: 4-5 servings a day Low-fat dairy: 2 servings a day Fish, poultry, or lean meat: 1 serving a day Beans and legumes: 2 or more servings a week Nuts and seeds: 1-2 servings a day Whole grains: 6-8 servings a day Extra-virgin olive oil: 3-4 servings a day Limit red meat and sweets to only a few servings a month What are my food choices? Mediterranean diet Recommended Grains: Whole-grain pasta. Brown rice. Bulgar wheat. Polenta. Couscous. Whole-wheat bread. Orpah Cobb. Vegetables: Artichokes. Beets. Broccoli. Cabbage. Carrots. Eggplant.  Green beans. Chard. Kale. Spinach. Onions. Leeks. Peas. Squash. Tomatoes. Peppers. Radishes. Fruits: Apples. Apricots. Avocado. Berries. Bananas. Cherries. Dates. Figs. Grapes. Lemons. Melon. Oranges. Peaches. Plums. Pomegranate. Meats and other protein foods: Beans. Almonds. Sunflower seeds. Pine nuts. Peanuts. Cod. Salmon. Scallops. Shrimp. Tuna. Tilapia. Clams. Oysters. Eggs. Dairy: Low-fat milk. Cheese. Greek yogurt. Beverages: Water. Red wine. Herbal tea. Fats and oils: Extra virgin olive oil. Avocado oil. Grape seed oil. Sweets and desserts: [place] yogurt with honey. Baked apples. Poached pears. Trail mix. Seasoning and other foods: Basil. Cilantro. Coriander. Cumin. Mint. Parsley. Sage. Rosemary. Tarragon. Garlic. Oregano. Thyme. Pepper. Balsalmic vinegar. Tahini. Hummus. Tomato sauce. Olives. Mushrooms. Limit these Grains: Prepackaged pasta or rice dishes. Prepackaged cereal with added sugar. Vegetables: Deep fried potatoes (french fries). Fruits: Fruit canned in syrup. Meats and other protein foods: Beef. Pork. Lamb. Poultry with skin. Hot dogs. Tomasa Blase. Dairy: Ice cream. Sour cream. Whole milk. Beverages: Juice. Sugar-sweetened soft drinks. Beer. Liquor and spirits. Fats and oils: Butter. Canola oil. Vegetable oil. Beef fat (tallow). Lard. Sweets and desserts: Cookies. Cakes. Pies. Candy. Seasoning and other foods: Mayonnaise. Premade sauces and marinades. The items listed may not be a complete list.  Talk with your dietitian about what dietary choices are right for you. Summary The Mediterranean diet includes both food and lifestyle choices. Eat a variety of fresh fruits and vegetables, beans, nuts, seeds, and whole grains. Limit the amount of red meat and sweets that you eat. Talk with your health care provider about whether it is safe for you to drink red wine in moderation. This means 1 glass a day for nonpregnant women and 2 glasses a day for men. A glass of wine equals 5 oz (150  mL). This information is not intended to replace advice given to you by your health care provider. Make sure you discuss any questions you have with your health care provider. Document Released: 09/30/2015 Document Revised: 11/02/2015 Document Reviewed: 09/30/2015 Elsevier Interactive Patient Education  2017 [employer].

## 2022-08-23 NOTE — Progress Notes (Signed)
Assessment/Plan:   Tony Thiam. is a very pleasant 82 y.o. year old RH male with a history of hypertension, hyperlipidemia, cataracts, history of CVA 2019,  DM, GERD, history of lung nodule, seen today for evaluation of memory loss. MMSE today is 18/30 (Unable to perform MoCA). Workup is progress.    Memory Impairment of unclear etiology, without behavioral disturbance  MRI brain without contrast to assess for underlying structural abnormality and assess vascular load is scheduled by PCP .  Check B12, TSH Start Donepezil 5mg  daily. Side effects discussed . Will likely increase dose to 10 mg daily during his next visit  Continue to control mood as per PCP Recommend good control of cardiovascular risk factors Fol[place]ow up in 2 months  Subjective:   The patient is accompanied by his wife  who supplements the history.   How long did patient have memory difficulties? For about 2 years, worse over the last 6 months. Patient has some difficulty remembering recent conversations and people names, new information.   repeats oneself?  Endorsed Disoriented when walking into a room?  Patient denies   Leaving objects in unusual places? denies   Wandering behavior?  denies   Any personality changes?  Patient denies   Any history of depression?:  Patient denies   Hallucinations or paranoia?  Patient denies   Seizures?   Patient denies    Any sleep changes?  Sleeps well. Denies vivid dreams, REM behavior or sleepwalking   Sleep apnea?  Patient denies   Any hygiene concerns?  Patient denies   Independent of bathing and dressing?  Endorsed  Does the patient needs help with medications? Wife is in charge , she fills up the pillbox Who is in charge of the finances? Wife is in charge     Any changes in appetite?  denies . "He snacks a lot"    Patient have trouble swallowing? denies   Does the patient cook? No   Any headaches?   denies   Chronic back pain ? denies   Ambulates with  difficulty? "He has DM neuropathy so he is more cautions when walking" Recent falls or head injuries? denies   Vision changes? denies   Unilateral weakness? Denies  Any tremors?   Denies.   Any anosmia?  Denies.   Any incontinence of urine? Endorsed, "has prostate issues", followed by Dr.Borden.    Any bowel dysfunction? Occasional diarrhea      Patient lives with wife  History of heavy alcohol intake? denies   History of heavy tobacco use? denies   Family history of dementia? Father had AD   Does patient drive? Yes     Past Medical History:  Diagnosis Date   Cataract    in both eyes    had cataracts surgically removed   Complication of anesthesia    reports during prostate bx year ago , his BP "dropped real low" and he had to stay overnight after what was suposed to be an amulatory surgery    CVA (cerebral infarction)    Diabetes mellitus    ED (erectile dysfunction)    Elevated PSA    Frequency of urination    GERD (gastroesophageal reflux disease)    History of nephrolithiasis    Hyperlipidemia    Hypertension    Lung nodule    Psoriasis    PSVT (paroxysmal supraventricular tachycardia)    Stroke (HCC)    2015   Stroke (HCC) 08/2017   reports no  lasting deficits since stroke, endorses that he feels back to regular self since before stroke      Past Surgical History:  Procedure Laterality Date   CATARACT EXTRACTION W/ INTRAOCULAR LENS IMPLANT Right 03/30/2012   Dr Dagoberto Ligas   COLONOSCOPY  09/07/2020   2019   ELECTROPHYSIOLOGIC STUDY N/A 09/24/2014   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 Frame   NM MYOVIEW LTD     normal EF 56% 03/08   PARATHYROIDECTOMY Left 02/28/2018   Procedure: LEFT INFERIOR PARATHYROIDECTOMY;  Surgeon: Darnell Level, MD;  Location: [facility];  Service: General;  Laterality: Left;   POLYPECTOMY     PROSTATE BIOPSY  01/05/2012   Procedure: BIOPSY TRANSRECTAL ULTRASONIC PROSTATE (TUBP);  Surgeon: Crecencio Mc, MD;  Location: [facility];  Service:  Urology;  Laterality: N/A;   PROSTATE BIOPSY N/A 10/14/2018   Procedure: BIOPSY TRANSRECTAL ULTRASONIC PROSTATE (TUBP);  Surgeon: Heloise Purpura, MD;  Location: [facility];  Service: Urology;  Laterality: N/A;   TEE WITHOUT CARDIOVERSION N/A 05/21/2014   Procedure: TRANSESOPHAGEAL ECHOCARDIOGRAM (TEE);  Surgeon: Jake Bathe, MD;  Location: [facility];  Service: Cardiovascular;  Laterality: N/A;   TONSILLECTOMY  1960     Allergies  Allergen Reactions   Morphine Other (See Comments)   Poison Ivy Extract Rash    Current Outpatient Medications  Medication Instructions   amLODipine (NORVASC) 10 MG tablet TAKE 1 TABLET BY MOUTH EVERY DAY   aspirin EC 81 mg, Oral, Daily   atorvastatin (LIPITOR) 80 MG tablet TAKE 1 TABLET BY MOUTH EVERY DAY   clopidogrel (PLAVIX) 75 MG tablet TAKE 1 TABLET BY MOUTH EVERY DAY   donepezil (ARICEPT) 5 mg, Oral, Every morning   empagliflozin (JARDIANCE) 10 mg, Oral, Daily before breakfast   glipiZIDE (GLUCOTROL XL) 10 mg, Oral, Daily with breakfast   hydrocortisone 2.5 % cream APPLY TOPICALLY 3 TIMES DAILY AS NEEDED.   ketoconazole (NIZORAL) 2 % shampoo APPLY TO 2 TO 3 TIMES A WEEK. LEAVE ON FOR   lisinopril (ZESTRIL) 40 mg, Oral, Daily   metFORMIN (GLUCOPHAGE-XR) 1,000 mg, Oral, Daily with breakfast   mometasone (ELOCON) 0.1 % cream Topical, 2 times daily PRN   ONETOUCH DELICA LANCETS FINE MISC Use 1 daily to obtain blood sample Dx Code E11.21   ONETOUCH VERIO test strip USE 1 STRIP DAILY TO CHECK BLOOD SUGAR DX CODE E11.21   Vitamin D3 2,000 Units, Oral, Daily     VITALS:   Vitals:   08/23/22 0932  BP: 134/84  Pulse: 66  Resp: 18  SpO2: 97%  Weight: 154 lb (69.9 kg)  Height: 5\' 8"  (1.727 m)      PHYSICAL EXAM   HEENT:  Normocephalic, atraumatic. The superficial temporal arteries are without ropiness or tenderness. Cardiovascular: Regular rate and rhythm. Lungs: Clear to auscultation bilaterally. Neck: There are no carotid bruits noted  bilaterally.  NEUROLOGICAL:     No data to display             08/23/2022    5:00 PM 09/06/2015    8:27 AM 03/08/2015   10:18 AM  MMSE - Mini Mental State Exam  Orientation to time 2 5 5   Orientation to Place 5 5 5   Registration 2 3 3   Attention/ Calculation 0 5 5  Recall 0 2 3  Language- name 2 objects 2 2 2   Language- repeat 1 1 1   Language- follow 3 step command 3 3 3   Language- read & follow direction 1 1 1  Write a sentence 1 1 1   Copy design 1 1 0  Total score 18 29 29      Orientation:  Alert and oriented to person, place and time. No aphasia or dysarthria. Fund of knowledge is reduced. Recent and remote memory impaired.  Attention and concentration are reduced.  Able to name objects and repeat phrases. Delayed recall  2/3 Cranial nerves: There is good facial symmetry. Extraocular muscles are intact and visual fields are full to confrontational testing. Speech is fluent and clear. No tongue deviation. Hearing is intact to conversational tone. Tone: Tone is good throughout. Sensation: Sensation is intact to light touch. Vibration is intact at the bilateral big toe.  Coordination: The patient has no difficulty with RAM's or FNF bilaterally. Normal finger to nose  Motor: Strength is 5/5 in the bilateral upper and lower extremities. There is no pronator drift. There are no fasciculations noted. DTR's: Deep tendon reflexes are 2/4 bilaterally. Gait and Station: The patient is able to ambulate without difficulty.The patient is able to heel toe walk without any difficulty. Gait is cautious and narrow. The patient is able to ambulate in a tandem fashion.       Thank you for allowing [place] the opportunity to participate in the care of this nice patient. Please do not hesitate to contact us for any questions or concerns.   Total time spent on today's visit was 61 minutes dedicated to this patient today, preparing to see patient, examining the patient, ordering tests and/or medications  and counseling the patient, documenting clinical information in the EHR or other health record, independently interpreting results and communicating results to the patient/family, discussing treatment and goals, answering patient's questions and coordinating care.  Cc:  Karie Schwalbe, MD  Marlowe Kays 08/23/2022 5:46 PM

## 2022-08-25 ENCOUNTER — Ambulatory Visit: Payer: Medicare HMO

## 2022-08-30 ENCOUNTER — Ambulatory Visit
Admission: RE | Admit: 2022-08-30 | Discharge: 2022-08-30 | Disposition: A | Discharge status: Home / Self Care | Payer: Medicare HMO | Source: Ambulatory Visit | Attending: Internal Medicine | Admitting: Internal Medicine

## 2022-08-30 DIAGNOSIS — G319 Degenerative disease of nervous system, unspecified: Secondary | ICD-10-CM | POA: Diagnosis not present

## 2022-08-30 DIAGNOSIS — G3184 Mild cognitive impairment, so stated: Secondary | ICD-10-CM | POA: Diagnosis not present

## 2022-08-30 DIAGNOSIS — I6782 Cerebral ischemia: Secondary | ICD-10-CM | POA: Diagnosis not present

## 2022-08-30 DIAGNOSIS — J329 Chronic sinusitis, unspecified: Secondary | ICD-10-CM | POA: Diagnosis not present

## 2022-09-06 DIAGNOSIS — Z7984 Long term (current) use of oral hypoglycemic drugs: Secondary | ICD-10-CM | POA: Diagnosis not present

## 2022-09-06 DIAGNOSIS — E1165 Type 2 diabetes mellitus with hyperglycemia: Secondary | ICD-10-CM | POA: Diagnosis not present

## 2022-09-27 DIAGNOSIS — E1165 Type 2 diabetes mellitus with hyperglycemia: Secondary | ICD-10-CM | POA: Diagnosis not present

## 2022-10-05 ENCOUNTER — Encounter (INDEPENDENT_AMBULATORY_CARE_PROVIDER_SITE_OTHER): Payer: Self-pay

## 2022-10-31 ENCOUNTER — Ambulatory Visit (INDEPENDENT_AMBULATORY_CARE_PROVIDER_SITE_OTHER): Payer: Medicare HMO | Admitting: Internal Medicine

## 2022-10-31 ENCOUNTER — Encounter: Payer: Self-pay | Admitting: Internal Medicine

## 2022-10-31 VITALS — BP 116/72 | HR 61 | Temp 97.8°F | Ht 68.0 in | Wt 159.0 lb

## 2022-10-31 DIAGNOSIS — Z23 Encounter for immunization: Secondary | ICD-10-CM | POA: Diagnosis not present

## 2022-10-31 DIAGNOSIS — H9319 Tinnitus, unspecified ear: Secondary | ICD-10-CM | POA: Insufficient documentation

## 2022-10-31 DIAGNOSIS — E119 Type 2 diabetes mellitus without complications: Secondary | ICD-10-CM | POA: Insufficient documentation

## 2022-10-31 DIAGNOSIS — E1121 Type 2 diabetes mellitus with diabetic nephropathy: Secondary | ICD-10-CM | POA: Diagnosis not present

## 2022-10-31 DIAGNOSIS — G3184 Mild cognitive impairment, so stated: Secondary | ICD-10-CM

## 2022-10-31 DIAGNOSIS — Z7984 Long term (current) use of oral hypoglycemic drugs: Secondary | ICD-10-CM | POA: Diagnosis not present

## 2022-10-31 MED ORDER — DONEPEZIL HCL 10 MG PO TBDP: 10.0000 mg | ORAL_TABLET | Freq: Every day | ORAL | 3 refills | Status: DC

## 2022-10-31 NOTE — Progress Notes (Signed)
Subjective:    Patient ID: Tony Jackson., male    DOB: 08/15/1940, 82 y.o.   MRN: [id number]  HPI Here with wife for follow up of diabetes and memory problems  Did see endocrinologist  A1c was down to 9.6% Added pioglitazone Checking AM sugars---still over 200 at times  Did have spell with sugars under 200 regularly in AM No sugared drinks--but does have desserts (low carb/sugar ice cream) Limited carbs--trying to do better  Saw neurology as well--Sara Gwynneth Munson Started donepezil No clear difference per wife  Thought he needed to go to ER yesterday Had "buzzing" in head--thought it was bugs Putting his hands over ears didn't help Plugging ears  No off balance feeling or dizziness Better today No known hearing damage---hearing is not great but stable  Current Outpatient Medications on File Prior to Visit  Medication Sig Dispense Refill   amLODipine (NORVASC) 10 MG tablet TAKE 1 TABLET BY MOUTH EVERY DAY 90 tablet 3   aspirin EC 81 MG tablet Take 81 mg by mouth daily.     atorvastatin (LIPITOR) 80 MG tablet TAKE 1 TABLET BY MOUTH EVERY DAY 90 tablet 3   Cholecalciferol (VITAMIN D3) 50 MCG (2000 UT) TABS Take 2,000 Units by mouth daily.     clopidogrel (PLAVIX) 75 MG tablet TAKE 1 TABLET BY MOUTH EVERY DAY 90 tablet 3   donepezil (ARICEPT) 5 MG tablet Take 1 tablet (5 mg total) by mouth in the morning. 30 tablet 11   empagliflozin (JARDIANCE) 10 MG TABS tablet Take 1 tablet (10 mg total) by mouth daily before breakfast. 30 tablet 11   glipiZIDE (GLUCOTROL XL) 10 MG 24 hr tablet Take 1 tablet (10 mg total) by mouth daily with breakfast. 90 tablet 3   hydrocortisone 2.5 % cream APPLY TOPICALLY 3 TIMES DAILY AS NEEDED. 28.35 g 3   ketoconazole (NIZORAL) 2 % shampoo APPLY TO 2 TO 3 TIMES A WEEK. LEAVE ON FOR 360 mL 2   lisinopril (ZESTRIL) 40 MG tablet Take 1 tablet (40 mg total) by mouth daily. 90 tablet 3   metFORMIN (GLUCOPHAGE-XR) 500 MG 24 hr tablet Take 2 tablets  (1,000 mg total) by mouth daily with breakfast. 180 tablet 3   mometasone (ELOCON) 0.1 % cream APPLY 1 APPLICATION TOPICALLY 2 (TWO) TIMES DAILY AS NEEDED (SKIN). 45 g 1   ONETOUCH DELICA LANCETS FINE MISC Use 1 daily to obtain blood sample Dx Code E11.21 100 each 3   ONETOUCH VERIO test strip USE 1 STRIP DAILY TO CHECK BLOOD SUGAR DX CODE E11.21 100 strip 3   pioglitazone (ACTOS) 15 MG tablet Take 15 mg by mouth daily.     No current facility-administered medications on file prior to visit.    Allergies  Allergen Reactions   Morphine Other (See Comments)   Poison Ivy Extract Rash    Past Medical History:  Diagnosis Date   Cataract    in both eyes    had cataracts surgically removed   Complication of anesthesia    reports during prostate bx year ago , his BP "dropped real low" and he had to stay overnight after what was suposed to be an amulatory surgery    CVA (cerebral infarction)    Diabetes mellitus    ED (erectile dysfunction)    Elevated PSA    Frequency of urination    GERD (gastroesophageal reflux disease)    History of nephrolithiasis    Hyperlipidemia    Hypertension  Lung nodule    Psoriasis    PSVT (paroxysmal supraventricular tachycardia)    Stroke (HCC)    2015   Stroke (HCC) 08/2017   reports no lasting deficits since stroke, endorses that he feels back to regular self since before stroke     Past Surgical History:  Procedure Laterality Date   CATARACT EXTRACTION W/ INTRAOCULAR LENS IMPLANT Right 03/30/2012   Dr Dagoberto Ligas   COLONOSCOPY  09/07/2020   2019   ELECTROPHYSIOLOGIC STUDY N/A 09/24/2014   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 Frame   NM MYOVIEW LTD     normal EF 56% 03/08   PARATHYROIDECTOMY Left 02/28/2018   Procedure: LEFT INFERIOR PARATHYROIDECTOMY;  Surgeon: Darnell Level, MD;  Location: [facility];  Service: General;  Laterality: Left;   POLYPECTOMY     PROSTATE BIOPSY  01/05/2012   Procedure: BIOPSY TRANSRECTAL ULTRASONIC PROSTATE (TUBP);  Surgeon: Crecencio Mc, MD;  Location: [facility];  Service: Urology;  Laterality: N/A;   PROSTATE BIOPSY N/A 10/14/2018   Procedure: BIOPSY TRANSRECTAL ULTRASONIC PROSTATE (TUBP);  Surgeon: Heloise Purpura, MD;  Location: [facility];  Service: Urology;  Laterality: N/A;   TEE WITHOUT CARDIOVERSION N/A 05/21/2014   Procedure: TRANSESOPHAGEAL ECHOCARDIOGRAM (TEE);  Surgeon: Jake Bathe, MD;  Location: [facility];  Service: Cardiovascular;  Laterality: N/A;   TONSILLECTOMY  1960    Family History  Problem Relation Age of Onset   Diabetes Mother    Breast cancer Mother    Colon polyps Brother    Heart disease Neg Hx    Colon cancer Neg Hx    Esophageal cancer Neg Hx    Rectal cancer Neg Hx    Stomach cancer Neg Hx     Social History   Socioeconomic History   Marital status: Married    Spouse name: Malachi Bonds   Number of children: 2   Years of education: HS   Highest education level: Not on file  Occupational History   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    Comment: Retired  Tobacco Use   Smoking status: Former    Current packs/day: 1.00    Average packs/day: 1 pack/day for 5.0 years (5.0 ttl pk-yrs)    Types: Cigarettes    Passive exposure: Past (as a child)   Smokeless tobacco: Current    Types: Chew   Tobacco comments:    QUIT SMOKING CIGARETTES 50 YRS AGO--  CHEWED TOBACCO FOR 40 YRS  Vaping Use   Vaping status: Never Used  Substance and Sexual Activity   Alcohol use: Not Currently    Alcohol/week: 0.0 standard drinks of alcohol   Drug use: No   Sexual activity: Not Currently  Other Topics Concern   Not on file  Social History Narrative   Has living will   Requests wife as health care POA. Children would be alternate   Would accept resuscitation attempts   Not sure about tube feeds      Lives at home with his wife   Right handed    Caffeine: occasional    Social Determinants of Health   Financial Resource Strain: Not on file  Food Insecurity: Not on file   Transportation Needs: Not on file  Physical Activity: Not on file  Stress: Not on file  Social Connections: Not on file  Intimate Partner Violence: Not on file   Review of Systems Weight is stable Sleeps okay---"too much" per wife. Discussed having stable rising time---like 9AM    Objective:   Physical Exam Constitutional:  Appearance: Normal appearance.  Neurological:     Mental Status: He is alert.  Psychiatric:        Mood and Affect: Mood normal.        Behavior: Behavior normal.            Assessment & Plan:

## 2022-10-31 NOTE — Assessment & Plan Note (Signed)
Vascular based May be on border of early vascular dementia No clear improvement with donepezil--will increase to 10mg  daily Has follow up with Marlowe Kays

## 2022-10-31 NOTE — Assessment & Plan Note (Signed)
Disturbing yesterday but better today No major hearing loss Nothing on MRI If ongoing issues--will refer to ENT

## 2022-10-31 NOTE — Addendum Note (Signed)
Addended by: Eual Fines on: 10/31/2022 10:30 AM   Modules accepted: Orders

## 2022-10-31 NOTE — Assessment & Plan Note (Signed)
Last A1c is down and now on actos 15mg  in addition to glipizide 10, jardaince 10 and metformin 1000 daily Going back to endocrine

## 2022-11-05 ENCOUNTER — Other Ambulatory Visit: Payer: Self-pay | Admitting: Internal Medicine

## 2022-11-07 DIAGNOSIS — E113293 Type 2 diabetes mellitus with mild nonproliferative diabetic retinopathy without macular edema, bilateral: Secondary | ICD-10-CM | POA: Diagnosis not present

## 2022-11-07 DIAGNOSIS — Z961 Presence of intraocular lens: Secondary | ICD-10-CM | POA: Diagnosis not present

## 2022-11-07 DIAGNOSIS — H35373 Puckering of macula, bilateral: Secondary | ICD-10-CM | POA: Diagnosis not present

## 2022-11-07 LAB — HM DIABETES EYE EXAM

## 2022-11-27 ENCOUNTER — Encounter: Payer: Self-pay | Admitting: Physician Assistant

## 2022-11-27 ENCOUNTER — Ambulatory Visit: Payer: Medicare HMO | Admitting: Physician Assistant

## 2022-11-27 VITALS — BP 132/80 | HR 74 | Resp 18 | Ht 68.0 in | Wt 162.0 lb

## 2022-11-27 DIAGNOSIS — R413 Other amnesia: Secondary | ICD-10-CM | POA: Diagnosis not present

## 2022-11-27 NOTE — Progress Notes (Signed)
Assessment/Plan:   Dementia concern for mixed vascular etiology and Alzheimer's  etiology without behavioral disturbance  Tony Jackson. is a very pleasant 82 y.o. RH male hypertension, hyperlipidemia, cataracts, history of CVA 2019,  DM , GERD, history of lung nodule seen today in follow up to discuss the MRI of the brain results from 09/07/22 . These were personally reviewed, remarkable for small chronic cortical-subcortical infarcts within the posterior right frontal lobe, right parietal lobe, and right occipital lobe in the background of advanced cerebral white matter chronic small vessel ischemic disease.  There are known chronic infarcts within bilateral cerebral hemispheres, mild cerebellar atrophy and moderate generalized cerebral atrophy. Patient is currently on donepezil 10 mg daily. Last MMSE (unable to perform MoCA) was 18/30.   This patient is accompanied in the office by his wife who supplements the history.  Previous records as well as any outside records available were reviewed prior to todays visit.     Follow up in 2 months. Continue donepezil 10 mg daily  as per PCP, side effects discussed  Continue B12 and vit  D daily Recommend good control of cardiovascular risk factors Continue to control mood as per PCP     Initial visit 08/23/2022  How long did patient have memory difficulties? For about 2 years, worse over the last 6 months. Patient has some difficulty remembering recent conversations and people names, new information.   repeats oneself?  Endorsed Disoriented when walking into a room?  Patient denies   Leaving objects in unusual places? denies   Wandering behavior?  denies   Any personality changes?  Patient denies   Any history of depression?:  Patient denies   Hallucinations or paranoia?  Patient denies   Seizures?   Patient denies    Any sleep changes?  Sleeps well. Denies vivid dreams, REM behavior or sleepwalking   Sleep apnea?  Patient denies   Any  hygiene concerns?  Patient denies   Independent of bathing and dressing?  Endorsed  Does the patient needs help with medications? Wife is in charge , she fills up the pillbox Who is in charge of the finances? Wife is in charge     Any changes in appetite?  denies . "He snacks a lot"    Patient have trouble swallowing? denies   Does the patient cook? No   Any headaches?   denies   Chronic back pain ? denies   Ambulates with difficulty? "He has DM neuropathy so he is more cautions when walking" Recent falls or head injuries? denies   Vision changes? denies   Unilateral weakness? Denies  Any tremors?   Denies.   Any anosmia?  Denies.   Any incontinence of urine? Endorsed, "has prostate issues", followed by Dr.Borden.    Any bowel dysfunction? Occasional diarrhea      Patient lives with wife  History of heavy alcohol intake? denies   History of heavy tobacco use? denies   Family history of dementia? Father had AD   Does patient drive? Yes      MRI of the brain 09/07/2022, personally reviewed redemonstrates small chronic cortical-subcortical infarcts within the posterior right frontal lobe, right parietal lobe, and right occipital lobe in the background of advanced cerebral white matter chronic small vessel ischemic disease.  There are no known chronic infarcts within bilateral cerebral hemispheres, mild cerebellar atrophy and moderate generalized cerebral atrophy.  CURRENT MEDICATIONS:  Outpatient Encounter Medications as of 11/27/2022  Medication Sig   amLODipine (  NORVASC) 10 MG tablet TAKE 1 TABLET BY MOUTH EVERY DAY   aspirin EC 81 MG tablet Take 81 mg by mouth daily.   atorvastatin (LIPITOR) 80 MG tablet TAKE 1 TABLET BY MOUTH EVERY DAY   Cholecalciferol (VITAMIN D3) 50 MCG (2000 UT) TABS Take 2,000 Units by mouth daily.   clopidogrel (PLAVIX) 75 MG tablet TAKE 1 TABLET BY MOUTH EVERY DAY   donepezil (ARICEPT ODT) 10 MG disintegrating tablet Take 1 tablet (10 mg total) by mouth at  bedtime.   empagliflozin (JARDIANCE) 10 MG TABS tablet Take 1 tablet (10 mg total) by mouth daily before breakfast.   glipiZIDE (GLUCOTROL XL) 10 MG 24 hr tablet Take 1 tablet (10 mg total) by mouth daily with breakfast.   hydrocortisone 2.5 % cream APPLY TOPICALLY 3 TIMES DAILY AS NEEDED.   ketoconazole (NIZORAL) 2 % shampoo APPLY TO 2 TO 3 TIMES A WEEK. LEAVE ON FOR   lisinopril (ZESTRIL) 40 MG tablet TAKE 1 TABLET BY MOUTH EVERY DAY   metFORMIN (GLUCOPHAGE-XR) 500 MG 24 hr tablet Take 2 tablets (1,000 mg total) by mouth daily with breakfast.   mometasone (ELOCON) 0.1 % cream APPLY 1 APPLICATION TOPICALLY 2 (TWO) TIMES DAILY AS NEEDED (SKIN).   ONETOUCH DELICA LANCETS FINE MISC Use 1 daily to obtain blood sample Dx Code E11.21   ONETOUCH VERIO test strip USE 1 STRIP DAILY TO CHECK BLOOD SUGAR DX CODE E11.21   pioglitazone (ACTOS) 15 MG tablet Take 15 mg by mouth daily.   No facility-administered encounter medications on file as of 11/27/2022.       08/23/2022    5:00 PM 09/06/2015    8:27 AM 03/08/2015   10:18 AM  MMSE - Mini Mental State Exam  Orientation to time 2 5 5   Orientation to Place 5 5 5   Registration 2 3 3   Attention/ Calculation 0 5 5  Recall 0 2 3  Language- name 2 objects 2 2 2   Language- repeat 1 1 1   Language- follow 3 step command 3 3 3   Language- read & follow direction 1 1 1   Write a sentence 1 1 1   Copy design 1 1 0  Total score 18 29 29        No data to display         Thank you for allowing [place] the opportunity to participate in the care of this nice patient. Please do not hesitate to contact us for any questions or concerns.   Total time spent on today's visit was 30 minutes dedicated to this patient today, preparing to see patient, examining the patient, ordering tests and/or medications and counseling the patient, documenting clinical information in the EHR or other health record, independently interpreting results and communicating results to the  patient/family, discussing treatment and goals, answering patient's questions and coordinating care.  Cc:  Karie Schwalbe, MD  Marlowe Kays 11/27/2022 7:06 AM

## 2022-11-27 NOTE — Patient Instructions (Signed)
It was a pleasure to see you today at our office.   Recommendations:  Continue donepezil 10 mg daily. Side effects discussed   Follow up  on Nov 26 at 11 am  Continue b12 and D daily     For psychiatric meds, mood meds: Please have your primary care physician manage these medications.  If you have any severe symptoms of a stroke, or other severe issues such as confusion,severe chills or fever, etc call 911 or go to the ER as you may need to be evaluated further   For assessment of decision of mental capacity and competency:  Call Dr. Erick Blinks, geriatric psychiatrist at [phone]  Counseling regarding caregiver distress, including caregiver depression, anxiety and issues regarding community resources, adult day care programs, adult living facilities, or memory care questions:  please contact your  Primary Doctor's Social Worker   Whom to call: Memory  decline, memory medications: Call our office [phone]    [email or web address]   RECOMMENDATIONS FOR ALL PATIENTS WITH MEMORY PROBLEMS: 1. Continue to exercise (Recommend 30 minutes of walking everyday, or 3 hours every week) 2. Increase social interactions - continue going to [place] and enjoy social gatherings with friends and family 3. Eat healthy, avoid fried foods and eat more fruits and vegetables 4. Maintain adequate blood pressure, blood sugar, and blood cholesterol level. Reducing the risk of stroke and cardiovascular disease also helps promoting better memory. 5. Avoid stressful situations. Live a simple life and avoid aggravations. Organize your time and prepare for the next day in anticipation. 6. Sleep well, avoid any interruptions of sleep and avoid any distractions in the bedroom that may interfere with adequate sleep quality 7. Avoid sugar, avoid sweets as there is a strong link between excessive sugar intake, diabetes, and cognitive impairment We discussed  the Mediterranean diet, which has been shown to help patients reduce the risk of progressive memory disorders and reduces cardiovascular risk. This includes eating fish, eat fruits and green leafy vegetables, nuts like almonds and hazelnuts, walnuts, and also use olive oil. Avoid fast foods and fried foods as much as possible. Avoid sweets and sugar as sugar use has been linked to worsening of memory function.  There is always a concern of gradual progression of memory problems. If this is the case, then we may need to adjust level of care according to patient needs. Support, both to the patient and caregiver, should then be put into place.        DRIVING: Regarding driving, in patients with progressive memory problems, driving will be impaired. We advise to have someone else do the driving if trouble finding directions or if minor accidents are reported. Independent driving assessment is available to determine safety of driving.   If you are interested in the driving assessment, you can contact the following:  [employer] in [place] [phone]  [facility] [phone]  [facility] [phone]  [facility] [phone] or [phone]   FALL PRECAUTIONS: Be cautious when walking. Scan the area for obstacles that may increase the risk of trips and falls. When getting up in the mornings, sit up at the edge of the bed for a few minutes before getting out of bed. Consider elevating the bed at the head end to avoid drop of blood pressure when getting up. Walk always in a well-lit room (use night lights in the walls). Avoid area rugs or power cords from appliances in the middle of the walkways. Use a [place] or  a cane if necessary and consider physical therapy for balance exercise. Get your eyesight checked regularly.  FINANCIAL OVERSIGHT: Supervision, especially oversight when making financial decisions or transactions is also  recommended.  HOME SAFETY: Consider the safety of the kitchen when operating appliances like stoves, microwave oven, and blender. Consider having supervision and share cooking responsibilities until no longer able to participate in those. Accidents with firearms and other hazards in the house should be identified and addressed as well.   ABILITY TO BE LEFT ALONE: If patient is unable to contact 911 operator, consider using LifeLine, or when the need is there, arrange for someone to stay with patients. Smoking is a fire hazard, consider supervision or cessation. Risk of wandering should be assessed by caregiver and if detected at any point, supervision and safe proof recommendations should be instituted.  MEDICATION SUPERVISION: Inability to self-administer medication needs to be constantly addressed. Implement a mechanism to ensure safe administration of the medications.      Mediterranean Diet A Mediterranean diet refers to food and lifestyle choices that are based on the traditions of countries located on the [employer] [place]. This way of eating has been shown to help prevent certain conditions and improve outcomes for people who have chronic diseases, like kidney disease and heart disease. What are tips for following this plan? Lifestyle  Cook and eat meals together with your family, when possible. Drink enough fluid to keep your urine clear or pale yellow. Be physically active every day. This includes: Aerobic exercise like running or swimming. Leisure activities like gardening, walking, or housework. Get 7-8 hours of sleep each night. If recommended by your health care provider, drink red wine in moderation. This means 1 glass a day for nonpregnant women and 2 glasses a day for men. A glass of wine equals 5 oz (150 mL). Reading food labels  Check the serving size of packaged foods. For foods such as rice and pasta, the serving size refers to the amount of cooked product, not  dry. Check the total fat in packaged foods. Avoid foods that have saturated fat or trans fats. Check the ingredients list for added sugars, such as corn syrup. Shopping  At the grocery store, buy most of your food from the areas near the walls of the store. This includes: Fresh fruits and vegetables (produce). Grains, beans, nuts, and seeds. Some of these may be available in unpackaged forms or large amounts (in bulk). Fresh seafood. Poultry and eggs. Low-fat dairy products. Buy whole ingredients instead of prepackaged foods. Buy fresh fruits and vegetables in-season from local farmers markets. Buy frozen fruits and vegetables in resealable bags. If you do not have access to quality fresh seafood, buy precooked frozen shrimp or canned fish, such as tuna, salmon, or sardines. Buy small amounts of raw or cooked vegetables, salads, or olives from the deli or salad bar at your store. Stock your pantry so you always have certain foods on hand, such as olive oil, canned tuna, canned tomatoes, rice, pasta, and beans. Cooking  Cook foods with extra-virgin olive oil instead of using butter or other vegetable oils. Have meat as a side dish, and have vegetables or grains as your main dish. This means having meat in small portions or adding small amounts of meat to foods like pasta or stew. Use beans or vegetables instead of meat in common dishes like chili or lasagna. Experiment with different cooking methods. Try roasting or broiling vegetables instead of steaming or sauteing them. Add frozen  vegetables to soups, stews, pasta, or rice. Add nuts or seeds for added healthy fat at each meal. You can add these to yogurt, salads, or vegetable dishes. Marinate fish or vegetables using olive oil, lemon juice, garlic, and fresh herbs. Meal planning  Plan to eat 1 vegetarian meal one day each week. Try to work up to 2 vegetarian meals, if possible. Eat seafood 2 or more times a week. Have healthy snacks  readily available, such as: Vegetable sticks with hummus. Greek yogurt. Fruit and nut trail mix. Eat balanced meals throughout the week. This includes: Fruit: 2-3 servings a day Vegetables: 4-5 servings a day Low-fat dairy: 2 servings a day Fish, poultry, or lean meat: 1 serving a day Beans and legumes: 2 or more servings a week Nuts and seeds: 1-2 servings a day Whole grains: 6-8 servings a day Extra-virgin olive oil: 3-4 servings a day Limit red meat and sweets to only a few servings a month What are my food choices? Mediterranean diet Recommended Grains: Whole-grain pasta. Brown rice. Bulgar wheat. Polenta. Couscous. Whole-wheat bread. Orpah Cobb. Vegetables: Artichokes. Beets. Broccoli. Cabbage. Carrots. Eggplant. Green beans. Chard. Kale. Spinach. Onions. Leeks. Peas. Squash. Tomatoes. Peppers. Radishes. Fruits: Apples. Apricots. Avocado. Berries. Bananas. Cherries. Dates. Figs. Grapes. Lemons. Melon. Oranges. Peaches. Plums. Pomegranate. Meats and other protein foods: Beans. Almonds. Sunflower seeds. Pine nuts. Peanuts. Cod. Salmon. Scallops. Shrimp. Tuna. Tilapia. Clams. Oysters. Eggs. Dairy: Low-fat milk. Cheese. Greek yogurt. Beverages: Water. Red wine. Herbal tea. Fats and oils: Extra virgin olive oil. Avocado oil. Grape seed oil. Sweets and desserts: [place] yogurt with honey. Baked apples. Poached pears. Trail mix. Seasoning and other foods: Basil. Cilantro. Coriander. Cumin. Mint. Parsley. Sage. Rosemary. Tarragon. Garlic. Oregano. Thyme. Pepper. Balsalmic vinegar. Tahini. Hummus. Tomato sauce. Olives. Mushrooms. Limit these Grains: Prepackaged pasta or rice dishes. Prepackaged cereal with added sugar. Vegetables: Deep fried potatoes (french fries). Fruits: Fruit canned in syrup. Meats and other protein foods: Beef. Pork. Lamb. Poultry with skin. Hot dogs. Tomasa Blase. Dairy: Ice cream. Sour cream. Whole milk. Beverages: Juice. Sugar-sweetened soft drinks. Beer. Liquor and  spirits. Fats and oils: Butter. Canola oil. Vegetable oil. Beef fat (tallow). Lard. Sweets and desserts: Cookies. Cakes. Pies. Candy. Seasoning and other foods: Mayonnaise. Premade sauces and marinades. The items listed may not be a complete list. Talk with your dietitian about what dietary choices are right for you. Summary The Mediterranean diet includes both food and lifestyle choices. Eat a variety of fresh fruits and vegetables, beans, nuts, seeds, and whole grains. Limit the amount of red meat and sweets that you eat. Talk with your health care provider about whether it is safe for you to drink red wine in moderation. This means 1 glass a day for nonpregnant women and 2 glasses a day for men. A glass of wine equals 5 oz (150 mL). This information is not intended to replace advice given to you by your health care provider. Make sure you discuss any questions you have with your health care provider. Document Released: 09/30/2015 Document Revised: 11/02/2015 Document Reviewed: 09/30/2015 Elsevier Interactive Patient Education  2017 [employer].

## 2022-11-28 DIAGNOSIS — E1165 Type 2 diabetes mellitus with hyperglycemia: Secondary | ICD-10-CM | POA: Diagnosis not present

## 2022-11-28 DIAGNOSIS — Z7984 Long term (current) use of oral hypoglycemic drugs: Secondary | ICD-10-CM | POA: Diagnosis not present

## 2023-01-01 ENCOUNTER — Other Ambulatory Visit: Payer: Self-pay | Admitting: Internal Medicine

## 2023-01-16 ENCOUNTER — Ambulatory Visit: Payer: Self-pay | Admitting: Physician Assistant

## 2023-02-18 ENCOUNTER — Encounter: Payer: Self-pay | Admitting: Internal Medicine

## 2023-02-27 ENCOUNTER — Encounter: Payer: Self-pay | Admitting: Internal Medicine

## 2023-03-01 ENCOUNTER — Encounter: Payer: Self-pay | Admitting: Internal Medicine

## 2023-03-01 ENCOUNTER — Ambulatory Visit (INDEPENDENT_AMBULATORY_CARE_PROVIDER_SITE_OTHER): Payer: Medicare HMO | Admitting: Internal Medicine

## 2023-03-01 VITALS — BP 138/76 | HR 70 | Temp 97.7°F | Ht 69.0 in | Wt 168.0 lb

## 2023-03-01 DIAGNOSIS — F01A11 Vascular dementia, mild, with agitation: Secondary | ICD-10-CM | POA: Diagnosis not present

## 2023-03-01 DIAGNOSIS — F01511 Vascular dementia, unspecified severity, with agitation: Secondary | ICD-10-CM | POA: Insufficient documentation

## 2023-03-01 MED ORDER — MEMANTINE HCL 5 MG PO TABS
5.0000 mg | ORAL_TABLET | Freq: Two times a day (BID) | ORAL | 2 refills | Status: DC
Start: 2023-03-01 — End: 2023-03-23

## 2023-03-01 NOTE — Assessment & Plan Note (Signed)
 May have component of Alzheimer's as well  On donepezil already Will try adding memantine--as that can help affective state as well 5mg  bid and consider increasing to 10 bid May need SSRI as well

## 2023-03-01 NOTE — Progress Notes (Signed)
 Subjective:    Patient ID: Tony Jackson., male    DOB: 29-Aug-1940, 83 y.o.   MRN: [id number]  HPI Here with wife to review emotional issues and memory problems  Has been agitated lately Knows he gets irritated with his wife---upset with her checks to church (that is their usual tithes) Has yelled at her--mildly threatening Some sadness---but he feels I have accepted the dementia diagnosis  Having some trouble with foot pain Claims walking 4 miles every morning--but forgets that this was many years ago  Current Outpatient Medications on File Prior to Visit  Medication Sig Dispense Refill   amLODipine  (NORVASC ) 10 MG tablet TAKE 1 TABLET BY MOUTH EVERY DAY 90 tablet 3   aspirin  EC 81 MG tablet Take 81 mg by mouth daily.     atorvastatin  (LIPITOR ) 80 MG tablet TAKE 1 TABLET BY MOUTH EVERY DAY 90 tablet 3   Cholecalciferol  (VITAMIN D3) 50 MCG (2000 UT) TABS Take 2,000 Units by mouth daily.     clopidogrel  (PLAVIX ) 75 MG tablet TAKE 1 TABLET BY MOUTH EVERY DAY 90 tablet 3   donepezil  (ARICEPT  ODT) 10 MG disintegrating tablet Take 1 tablet (10 mg total) by mouth at bedtime. 90 tablet 3   empagliflozin  (JARDIANCE ) 10 MG TABS tablet Take 1 tablet (10 mg total) by mouth daily before breakfast. 30 tablet 11   finasteride (PROSCAR) 5 MG tablet Take 5 mg by mouth daily.     glipiZIDE  (GLUCOTROL  XL) 10 MG 24 hr tablet Take 1 tablet (10 mg total) by mouth daily with breakfast. 90 tablet 3   hydrocortisone  2.5 % cream APPLY TOPICALLY 3 TIMES DAILY AS NEEDED. 28.35 g 3   ketoconazole  (NIZORAL ) 2 % shampoo APPLY TO 2 TO 3 TIMES A WEEK. LEAVE ON FOR 360 mL 2   lisinopril  (ZESTRIL ) 40 MG tablet TAKE 1 TABLET BY MOUTH EVERY DAY 90 tablet 3   metFORMIN  (GLUCOPHAGE -XR) 500 MG 24 hr tablet Take 2 tablets (1,000 mg total) by mouth daily with breakfast. 180 tablet 3   mometasone  (ELOCON ) 0.1 % cream APPLY 1 APPLICATION TOPICALLY 2 (TWO) TIMES DAILY AS NEEDED (SKIN). 45 g 1   ONETOUCH DELICA  LANCETS FINE MISC Use 1 daily to obtain blood sample Dx Code E11.21 100 each 3   ONETOUCH VERIO test strip USE 1 STRIP DAILY TO CHECK BLOOD SUGAR DX CODE E11.21 100 strip 3   pioglitazone (ACTOS) 15 MG tablet Take 15 mg by mouth daily.     No current facility-administered medications on file prior to visit.    Allergies  Allergen Reactions   Morphine Other (See Comments)   Poison Ivy Extract Rash    Past Medical History:  Diagnosis Date   Cataract    in both eyes    had cataracts surgically removed   Complication of anesthesia    reports during prostate bx year ago , his BP dropped real low and he had to stay overnight after what was suposed to be an amulatory surgery    CVA (cerebral infarction)    Diabetes mellitus    ED (erectile dysfunction)    Elevated PSA    Frequency of urination    GERD (gastroesophageal reflux disease)    History of nephrolithiasis    Hyperlipidemia    Hypertension    Lung nodule    Psoriasis    PSVT (paroxysmal supraventricular tachycardia) (HCC)    Stroke (HCC)    2015   Stroke (HCC) 08/2017  reports no lasting deficits since stroke, endorses that he feels back to regular self since before stroke     Past Surgical History:  Procedure Laterality Date   CATARACT EXTRACTION W/ INTRAOCULAR LENS IMPLANT Right 03/30/2012   Dr Rosan   COLONOSCOPY  09/07/2020   2019   ELECTROPHYSIOLOGIC STUDY N/A 09/24/2014   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   NM MYOVIEW LTD     normal EF 56% 03/08   PARATHYROIDECTOMY Left 02/28/2018   Procedure: LEFT INFERIOR PARATHYROIDECTOMY;  Surgeon: Eletha Boas, MD;  Location: [facility];  Service: General;  Laterality: Left;   POLYPECTOMY     PROSTATE BIOPSY  01/05/2012   Procedure: BIOPSY TRANSRECTAL ULTRASONIC PROSTATE (TUBP);  Surgeon: Noretta Ferrara, MD;  Location: [facility];  Service: Urology;  Laterality: N/A;   PROSTATE BIOPSY N/A 10/14/2018   Procedure: BIOPSY TRANSRECTAL ULTRASONIC PROSTATE (TUBP);   Surgeon: Ferrara Glance, MD;  Location: [facility];  Service: Urology;  Laterality: N/A;   TEE WITHOUT CARDIOVERSION N/A 05/21/2014   Procedure: TRANSESOPHAGEAL ECHOCARDIOGRAM (TEE);  Surgeon: Oneil JAYSON Parchment, MD;  Location: [facility];  Service: Cardiovascular;  Laterality: N/A;   TONSILLECTOMY  1960    Family History  Problem Relation Age of Onset   Diabetes Mother    Breast cancer Mother    Colon polyps Brother    Heart disease Neg Hx    Colon cancer Neg Hx    Esophageal cancer Neg Hx    Rectal cancer Neg Hx    Stomach cancer Neg Hx     Social History   Socioeconomic History   Marital status: Married    Spouse name: Meade   Number of children: 2   Years of education: HS   Highest education level: Not on file  Occupational History   Occupation: holiday representative- [employer]    Comment: Retired  Tobacco Use   Smoking status: Former    Current packs/day: 1.00    Average packs/day: 1 pack/day for 5.0 years (5.0 ttl pk-yrs)    Types: Cigarettes    Passive exposure: Past (as a child)   Smokeless tobacco: Current    Types: Chew   Tobacco comments:    QUIT SMOKING CIGARETTES 50 YRS AGO--  CHEWED TOBACCO FOR 40 YRS  Vaping Use   Vaping status: Never Used  Substance and Sexual Activity   Alcohol  use: Not Currently    Alcohol /week: 0.0 standard drinks of alcohol    Drug use: No   Sexual activity: Not Currently  Other Topics Concern   Not on file  Social History Narrative   Has living will   Requests wife as health care POA. Children would be alternate   Would accept resuscitation attempts   Not sure about tube feeds      Lives at home with his wife   Right handed    Caffeine: occasional    Social Drivers of Corporate Investment Banker Strain: Not on file  Food Insecurity: Not on file  Transportation Needs: Not on file  Physical Activity: Not on file  Stress: Not on file  Social Connections: Not on file  Intimate Partner Violence: Not on file   Review of Systems Sleep  is variable---sometimes late till 10AM. Other times, not at all if something on his mind He brings up past events and ruminates on them Eating okay    Objective:   Physical Exam Constitutional:      Appearance: Normal appearance.  Neurological:     Mental Status:  He is alert.     Comments: Clearly confused---seems to be discussing past events like they just happened  Psychiatric:     Comments: Calm now            Assessment & Plan:

## 2023-03-15 ENCOUNTER — Ambulatory Visit (INDEPENDENT_AMBULATORY_CARE_PROVIDER_SITE_OTHER): Payer: Medicare HMO | Admitting: Physician Assistant

## 2023-03-15 ENCOUNTER — Encounter: Payer: Self-pay | Admitting: Physician Assistant

## 2023-03-15 VITALS — BP 153/74 | HR 63 | Resp 20 | Wt 172.0 lb

## 2023-03-15 DIAGNOSIS — F01511 Vascular dementia, unspecified severity, with agitation: Secondary | ICD-10-CM | POA: Diagnosis not present

## 2023-03-15 NOTE — Progress Notes (Signed)
Assessment/Plan:   Dementia likely due to mixed vascular and Alzheimer's etiology without behavioral disturbance   Tony Jackson. is a very pleasant 83 y.o. RH male with a history of hypertension, hyperlipidemia, cataracts, history of CVA 2019, DM , GERD, history of lung nodule seen today in follow up for memory loss. Patient is currently on donepezil 10 mg daily, as well as memantine 5 mg twice daily by PCP. Memory is stable.  MMSE is 20/30.     Follow up in  6 months. Continue donepezil 10 mg daily and memantine,consider increasing to 10 mg twice daily, this is followed by PCP side effects discussed Continue B12 and vitamin D daily Recommend good control of her cardiovascular risk factors.  Continue Plavix and baby aspirin Continue to control mood as per PCP Monitor driving    Subjective:    This patient is accompanied in the office by his wife  who supplements the history.  Previous records as well as any outside records available were reviewed prior to todays visit. Patient was last seen on November 27, 2022.  Last MMSE in July 2024 (unable to perform MoCA) was 18/30.    Any changes in memory since last visit? "A little worse" wife says, patient denies. He feels his memory is stable. He has some difficulty remembering new conversations and names, new information". repeats oneself?  Endorsed Disoriented when walking into a room?  Patient denies   Leaving objects?  May misplace things but not in unusual places  Wandering behavior?  denies   Any personality changes since last visit?  He has moments of irritability, and at times he may be paranoid.  He may become combative at times. "Memantine helps"-wife says. Any worsening depression?:  Denies.   Hallucinations or paranoia?  Denies.   Seizures? denies    Any sleep changes?  Sleeps well.  Denies vivid dreams, REM behavior or sleepwalking   Sleep apnea?   Denies.   Any hygiene concerns? Denies.  Independent of bathing and  dressing?  Endorsed  Does the patient needs help with medications?  Wife is in charge  Who is in charge of the finances?  Wife is in charge    Any changes in appetite?  He enjoys snacking frequently.   Patient have trouble swallowing? Denies.   Does the patient cook? No Any headaches?   denies   Chronic back pain  denies   Ambulates with difficulty?  He has diabetic neuropathy so he is more cautious when walking. He is not very active .  Recent falls or head injuries? denies     Unilateral weakness, numbness or tingling? He has peripheral neuropathy due to DM 2, his wife who is a patient of Dr. Loleta Chance, wants him to see the patient.  Any tremors?  Denies   Any anosmia?  Denies   Any incontinence of urine?  Endorsed "due to prostate issues ", follows Dr. Laverle Patter, urology. Any bowel dysfunction?  Patient has intermittent  diarrhea    Patient lives with his wife  Does the patient drive?  Yes, denies getting lost. "I am a good driver".    Initial visit 08/23/2022   How long did patient have memory difficulties? For about 2 years, worse over the last 6 months. Patient has some difficulty remembering recent conversations and people names, new information.   repeats oneself?  Endorsed Disoriented when walking into a room?  Patient denies   Leaving objects in unusual places? denies   Wandering behavior?  denies   Any personality changes?  Patient denies   Any history of depression?:  Patient denies   Hallucinations or paranoia?  Patient denies   Seizures?   Patient denies    Any sleep changes?  Sleeps well. Denies vivid dreams, REM behavior or sleepwalking   Sleep apnea?  Patient denies   Any hygiene concerns?  Patient denies   Independent of bathing and dressing?  Endorsed  Does the patient needs help with medications? Wife is in charge , she fills up the pillbox Who is in charge of the finances? Wife is in charge     Any changes in appetite?  denies . "He snacks a lot"    Patient have trouble  swallowing? denies   Does the patient cook? No   Any headaches?   denies   Chronic back pain ? denies   Ambulates with difficulty? "He has DM neuropathy so he is more cautions when walking" Recent falls or head injuries? denies   Vision changes? denies   Unilateral weakness? Denies  Any tremors?   Denies.   Any anosmia?  Denies.   Any incontinence of urine? Endorsed, "has prostate issues", followed by Dr.Borden.    Any bowel dysfunction? Occasional diarrhea      Patient lives with wife  History of heavy alcohol intake? denies   History of heavy tobacco use? denies   Family history of dementia? Father had AD   Does patient drive? Yes        MRI of the brain results from 09/07/22 personally reviewed, remarkable for small chronic cortical-subcortical infarcts within the posterior right frontal lobe, right parietal lobe, and right occipital lobe in the background of advanced cerebral white matter chronic small vessel ischemic disease.  There are known chronic infarcts within bilateral cerebral hemispheres, mild cerebellar atrophy and moderate generalized cerebral atrophy   PREVIOUS MEDICATIONS:   CURRENT MEDICATIONS:  Outpatient Encounter Medications as of 03/15/2023  Medication Sig   amLODipine (NORVASC) 10 MG tablet TAKE 1 TABLET BY MOUTH EVERY DAY   aspirin EC 81 MG tablet Take 81 mg by mouth daily.   atorvastatin (LIPITOR) 80 MG tablet TAKE 1 TABLET BY MOUTH EVERY DAY   Cholecalciferol (VITAMIN D3) 50 MCG (2000 UT) TABS Take 2,000 Units by mouth daily.   clopidogrel (PLAVIX) 75 MG tablet TAKE 1 TABLET BY MOUTH EVERY DAY   donepezil (ARICEPT ODT) 10 MG disintegrating tablet Take 1 tablet (10 mg total) by mouth at bedtime.   empagliflozin (JARDIANCE) 10 MG TABS tablet Take 1 tablet (10 mg total) by mouth daily before breakfast.   finasteride (PROSCAR) 5 MG tablet Take 5 mg by mouth daily.   glipiZIDE (GLUCOTROL XL) 10 MG 24 hr tablet Take 1 tablet (10 mg total) by mouth daily with  breakfast.   hydrocortisone 2.5 % cream APPLY TOPICALLY 3 TIMES DAILY AS NEEDED.   ketoconazole (NIZORAL) 2 % shampoo APPLY TO 2 TO 3 TIMES A WEEK. LEAVE ON FOR   lisinopril (ZESTRIL) 40 MG tablet TAKE 1 TABLET BY MOUTH EVERY DAY   memantine (NAMENDA) 5 MG tablet Take 1 tablet (5 mg total) by mouth 2 (two) times daily.   metFORMIN (GLUCOPHAGE-XR) 500 MG 24 hr tablet Take 2 tablets (1,000 mg total) by mouth daily with breakfast.   mometasone (ELOCON) 0.1 % cream APPLY 1 APPLICATION TOPICALLY 2 (TWO) TIMES DAILY AS NEEDED (SKIN).   ONETOUCH DELICA LANCETS FINE MISC Use 1 daily to obtain blood sample Dx Code E11.21  ONETOUCH VERIO test strip USE 1 STRIP DAILY TO CHECK BLOOD SUGAR DX CODE E11.21   pioglitazone (ACTOS) 15 MG tablet Take 15 mg by mouth daily.   No facility-administered encounter medications on file as of 03/15/2023.       03/15/2023    3:00 PM 08/23/2022    5:00 PM 09/06/2015    8:27 AM  MMSE - Mini Mental State Exam  Orientation to time 2 2 5   Orientation to Place 5 5 5   Registration 3 2 3   Attention/ Calculation 0 0 5  Recall 1 0 2  Language- name 2 objects 2 2 2   Language- repeat 1 1 1   Language- follow 3 step command 3 3 3   Language- read & follow direction 1 1 1   Write a sentence 1 1 1   Copy design 1 1 1   Total score 20 18 29        No data to display          Objective:     PHYSICAL EXAMINATION:    VITALS:   Vitals:   03/15/23 1513 03/15/23 1541  BP: (!) 161/77 (!) 153/74  Pulse: 63   Resp: 20   SpO2: 98%   Weight: 172 lb (78 kg)     GEN:  The patient appears stated age and is in NAD. HEENT:  Normocephalic, atraumatic.   Neurological examination:  General: NAD, well-groomed, appears stated age. Orientation: The patient is alert. Oriented to person, place and  not to date Cranial nerves: There is good facial symmetry.The speech is fluent and clear. No aphasia or dysarthria. Fund of knowledge is appropriate. Recent and remote memory are  impaired. Attention and concentration are reduced.  Able to name objects and repeat phrases.  Hearing is decreased to conversational tone.   Sensation: Sensation is intact to light touch throughout Motor: Strength is at least antigravity x4. DTR's 2/4 in UE/LE     Movement examination: Tone: There is normal tone in the UE/LE Abnormal movements:  no tremor.  No myoclonus.  No asterixis.   Coordination:  There is no decremation with RAM's. Normal finger to nose  Gait and Station: The patient has  some difficulty arising out of a deep-seated chair without the use of the hands. The patient's stride length is good.  Gait is cautious and narrow.    Thank you for allowing [place] the opportunity to participate in the care of this nice patient. Please do not hesitate to contact us for any questions or concerns.   Total time spent on today's visit was 30 minutes dedicated to this patient today, preparing to see patient, examining the patient, ordering tests and/or medications and counseling the patient, documenting clinical information in the EHR or other health record, independently interpreting results and communicating results to the patient/family, discussing treatment and goals, answering patient's questions and coordinating care.  Cc:  Karie Schwalbe, MD  Tony Jackson 03/15/2023 3:43 PM

## 2023-03-15 NOTE — Patient Instructions (Addendum)
It was a pleasure to see you today at our office.   Recommendations:  Continue donepezil 10 mg daily. Side effects discussed   Increase memantine to 10 mg twice a day Follow up  in 6 months Continue b12 and D daily  Check hearing     For psychiatric meds, mood meds: Please have your primary care physician manage these medications.  If you have any severe symptoms of a stroke, or other severe issues such as confusion,severe chills or fever, etc call 911 or go to the ER as you may need to be evaluated further   For assessment of decision of mental capacity and competency:  Call Dr. Erick Blinks, geriatric psychiatrist at [phone]  Counseling regarding caregiver distress, including caregiver depression, anxiety and issues regarding community resources, adult day care programs, adult living facilities, or memory care questions:  please contact your  Primary Doctor's Social Worker   Whom to call: Memory  decline, memory medications: Call our office [phone]    [email or web address]   RECOMMENDATIONS FOR ALL PATIENTS WITH MEMORY PROBLEMS: 1. Continue to exercise (Recommend 30 minutes of walking everyday, or 3 hours every week) 2. Increase social interactions - continue going to [place] and enjoy social gatherings with friends and family 3. Eat healthy, avoid fried foods and eat more fruits and vegetables 4. Maintain adequate blood pressure, blood sugar, and blood cholesterol level. Reducing the risk of stroke and cardiovascular disease also helps promoting better memory. 5. Avoid stressful situations. Live a simple life and avoid aggravations. Organize your time and prepare for the next day in anticipation. 6. Sleep well, avoid any interruptions of sleep and avoid any distractions in the bedroom that may interfere with adequate sleep quality 7. Avoid sugar, avoid sweets as there is a strong link between excessive sugar intake,  diabetes, and cognitive impairment We discussed the Mediterranean diet, which has been shown to help patients reduce the risk of progressive memory disorders and reduces cardiovascular risk. This includes eating fish, eat fruits and green leafy vegetables, nuts like almonds and hazelnuts, walnuts, and also use olive oil. Avoid fast foods and fried foods as much as possible. Avoid sweets and sugar as sugar use has been linked to worsening of memory function.  There is always a concern of gradual progression of memory problems. If this is the case, then we may need to adjust level of care according to patient needs. Support, both to the patient and caregiver, should then be put into place.        DRIVING: Regarding driving, in patients with progressive memory problems, driving will be impaired. We advise to have someone else do the driving if trouble finding directions or if minor accidents are reported. Independent driving assessment is available to determine safety of driving.   If you are interested in the driving assessment, you can contact the following:  [facility] [employer] [facility] in [place] [phone]  [facility] [phone]  [facility] [phone]  [facility] [phone] or [phone]   FALL PRECAUTIONS: Be cautious when walking. Scan the area for obstacles that may increase the risk of trips and falls. When getting up in the mornings, sit up at the edge of the bed for a few minutes before getting out of bed. Consider elevating the bed at the head end to avoid drop of blood pressure when getting up. Walk always in a well-lit room (use night lights in the walls). Avoid area rugs or power cords from appliances in the middle  of the walkways. Use a [place] or a cane if necessary and consider physical therapy for balance exercise. Get your eyesight checked regularly.  FINANCIAL OVERSIGHT: Supervision, especially oversight when making financial  decisions or transactions is also recommended.  HOME SAFETY: Consider the safety of the kitchen when operating appliances like stoves, microwave oven, and blender. Consider having supervision and share cooking responsibilities until no longer able to participate in those. Accidents with firearms and other hazards in the house should be identified and addressed as well.   ABILITY TO BE LEFT ALONE: If patient is unable to contact 911 operator, consider using LifeLine, or when the need is there, arrange for someone to stay with patients. Smoking is a fire hazard, consider supervision or cessation. Risk of wandering should be assessed by caregiver and if detected at any point, supervision and safe proof recommendations should be instituted.  MEDICATION SUPERVISION: Inability to self-administer medication needs to be constantly addressed. Implement a mechanism to ensure safe administration of the medications.      Mediterranean Diet A Mediterranean diet refers to food and lifestyle choices that are based on the traditions of countries located on the [employer]. This way of eating has been shown to help prevent certain conditions and improve outcomes for people who have chronic diseases, like kidney disease and heart disease. What are tips for following this plan? Lifestyle  Cook and eat meals together with your family, when possible. Drink enough fluid to keep your urine clear or pale yellow. Be physically active every day. This includes: Aerobic exercise like running or swimming. Leisure activities like gardening, walking, or housework. Get 7-8 hours of sleep each night. If recommended by your health care provider, drink red wine in moderation. This means 1 glass a day for nonpregnant women and 2 glasses a day for men. A glass of wine equals 5 oz (150 mL). Reading food labels  Check the serving size of packaged foods. For foods such as rice and pasta, the serving size refers to the amount  of cooked product, not dry. Check the total fat in packaged foods. Avoid foods that have saturated fat or trans fats. Check the ingredients list for added sugars, such as corn syrup. Shopping  At the grocery store, buy most of your food from the areas near the walls of the store. This includes: Fresh fruits and vegetables (produce). Grains, beans, nuts, and seeds. Some of these may be available in unpackaged forms or large amounts (in bulk). Fresh seafood. Poultry and eggs. Low-fat dairy products. Buy whole ingredients instead of prepackaged foods. Buy fresh fruits and vegetables in-season from local farmers markets. Buy frozen fruits and vegetables in resealable bags. If you do not have access to quality fresh seafood, buy precooked frozen shrimp or canned fish, such as tuna, salmon, or sardines. Buy small amounts of raw or cooked vegetables, salads, or olives from the deli or salad bar at your store. Stock your pantry so you always have certain foods on hand, such as olive oil, canned tuna, canned tomatoes, rice, pasta, and beans. Cooking  Cook foods with extra-virgin olive oil instead of using butter or other vegetable oils. Have meat as a side dish, and have vegetables or grains as your main dish. This means having meat in small portions or adding small amounts of meat to foods like pasta or stew. Use beans or vegetables instead of meat in common dishes like chili or lasagna. Experiment with different cooking methods. Try roasting or broiling vegetables instead  of steaming or sauteing them. Add frozen vegetables to soups, stews, pasta, or rice. Add nuts or seeds for added healthy fat at each meal. You can add these to yogurt, salads, or vegetable dishes. Marinate fish or vegetables using olive oil, lemon juice, garlic, and fresh herbs. Meal planning  Plan to eat 1 vegetarian meal one day each week. Try to work up to 2 vegetarian meals, if possible. Eat seafood 2 or more times a  week. Have healthy snacks readily available, such as: Vegetable sticks with hummus. Greek yogurt. Fruit and nut trail mix. Eat balanced meals throughout the week. This includes: Fruit: 2-3 servings a day Vegetables: 4-5 servings a day Low-fat dairy: 2 servings a day Fish, poultry, or lean meat: 1 serving a day Beans and legumes: 2 or more servings a week Nuts and seeds: 1-2 servings a day Whole grains: 6-8 servings a day Extra-virgin olive oil: 3-4 servings a day Limit red meat and sweets to only a few servings a month What are my food choices? Mediterranean diet Recommended Grains: Whole-grain pasta. Brown rice. Bulgar wheat. Polenta. Couscous. Whole-wheat bread. Orpah Cobb. Vegetables: Artichokes. Beets. Broccoli. Cabbage. Carrots. Eggplant. Green beans. Chard. Kale. Spinach. Onions. Leeks. Peas. Squash. Tomatoes. Peppers. Radishes. Fruits: Apples. Apricots. Avocado. Berries. Bananas. Cherries. Dates. Figs. Grapes. Lemons. Melon. Oranges. Peaches. Plums. Pomegranate. Meats and other protein foods: Beans. Almonds. Sunflower seeds. Pine nuts. Peanuts. Cod. Salmon. Scallops. Shrimp. Tuna. Tilapia. Clams. Oysters. Eggs. Dairy: Low-fat milk. Cheese. Greek yogurt. Beverages: Water. Red wine. Herbal tea. Fats and oils: Extra virgin olive oil. Avocado oil. Grape seed oil. Sweets and desserts: [place] yogurt with honey. Baked apples. Poached pears. Trail mix. Seasoning and other foods: Basil. Cilantro. Coriander. Cumin. Mint. Parsley. Sage. Rosemary. Tarragon. Garlic. Oregano. Thyme. Pepper. Balsalmic vinegar. Tahini. Hummus. Tomato sauce. Olives. Mushrooms. Limit these Grains: Prepackaged pasta or rice dishes. Prepackaged cereal with added sugar. Vegetables: Deep fried potatoes (french fries). Fruits: Fruit canned in syrup. Meats and other protein foods: Beef. Pork. Lamb. Poultry with skin. Hot dogs. Tomasa Blase. Dairy: Ice cream. Sour cream. Whole milk. Beverages: Juice. Sugar-sweetened soft  drinks. Beer. Liquor and spirits. Fats and oils: Butter. Canola oil. Vegetable oil. Beef fat (tallow). Lard. Sweets and desserts: Cookies. Cakes. Pies. Candy. Seasoning and other foods: Mayonnaise. Premade sauces and marinades. The items listed may not be a complete list. Talk with your dietitian about what dietary choices are right for you. Summary The Mediterranean diet includes both food and lifestyle choices. Eat a variety of fresh fruits and vegetables, beans, nuts, seeds, and whole grains. Limit the amount of red meat and sweets that you eat. Talk with your health care provider about whether it is safe for you to drink red wine in moderation. This means 1 glass a day for nonpregnant women and 2 glasses a day for men. A glass of wine equals 5 oz (150 mL). This information is not intended to replace advice given to you by your health care provider. Make sure you discuss any questions you have with your health care provider. Document Released: 09/30/2015 Document Revised: 11/02/2015 Document Reviewed: 09/30/2015 Elsevier Interactive Patient Education  2017 [employer].

## 2023-03-16 DIAGNOSIS — N401 Enlarged prostate with lower urinary tract symptoms: Secondary | ICD-10-CM | POA: Diagnosis not present

## 2023-03-16 DIAGNOSIS — N3941 Urge incontinence: Secondary | ICD-10-CM | POA: Diagnosis not present

## 2023-03-16 DIAGNOSIS — R972 Elevated prostate specific antigen [PSA]: Secondary | ICD-10-CM | POA: Diagnosis not present

## 2023-03-23 ENCOUNTER — Other Ambulatory Visit: Payer: Self-pay | Admitting: Internal Medicine

## 2023-03-25 ENCOUNTER — Other Ambulatory Visit: Payer: Self-pay | Admitting: Internal Medicine

## 2023-03-28 ENCOUNTER — Other Ambulatory Visit: Payer: Self-pay | Admitting: Internal Medicine

## 2023-03-29 ENCOUNTER — Encounter: Payer: Self-pay | Admitting: Internal Medicine

## 2023-03-29 ENCOUNTER — Telehealth: Payer: Self-pay

## 2023-03-29 ENCOUNTER — Ambulatory Visit: Payer: Medicare HMO | Admitting: Internal Medicine

## 2023-03-29 VITALS — BP 132/84 | HR 62 | Temp 98.3°F | Ht 69.0 in | Wt 171.0 lb

## 2023-03-29 DIAGNOSIS — E1121 Type 2 diabetes mellitus with diabetic nephropathy: Secondary | ICD-10-CM

## 2023-03-29 DIAGNOSIS — F01A11 Vascular dementia, mild, with agitation: Secondary | ICD-10-CM

## 2023-03-29 MED ORDER — MEMANTINE HCL 10 MG PO TABS: 10.0000 mg | ORAL_TABLET | Freq: Two times a day (BID) | ORAL | 5 refills | Status: DC

## 2023-03-29 NOTE — Telephone Encounter (Signed)
 Called and spoke to pt's wife. She said the $400 was for a 90 day supply. Everything is fine.

## 2023-03-29 NOTE — Assessment & Plan Note (Signed)
 Sugars better now Will recheck in 3 moths

## 2023-03-29 NOTE — Telephone Encounter (Signed)
 Pt's wife contacted office and msg was sent to this nurse in error. The msg should have gone to [place], [place]. She is wondering if there is another medication that could be prescribed to replace the jardiance  if the price is going to be over $400. She said they can pay $100.  Advised pt's wife, Meade, a msg will be sent to PCP and Clotilda, CMA, and this office will f/u. Meade was appreciative and verbalized understanding.

## 2023-03-29 NOTE — Assessment & Plan Note (Addendum)
 Has responded to the memantine ---will increase to 10 bid On the donepezil  also Also sees Tex Filbert for this

## 2023-03-29 NOTE — Telephone Encounter (Signed)
 Copied from CRM [id number]. Topic: Clinical - Prescription Issue >> Mar 29, 2023 11:31 AM Burnard DEL wrote: Reason for CRM: patient called in stating that medication empagliflozin  (JARDIANCE ) 10 MG TABS tablet price has increase significantly to 400 dollars from 100. She is wondering if there may be some type of coupon or assistance that she can use to help lower the price?

## 2023-03-29 NOTE — Progress Notes (Signed)
 Subjective:    Patient ID: Tony JAYSON Vannie Mickey., male    DOB: 05-13-1940, 83 y.o.   MRN: [id number]  HPI Here with wife for follow up of dementia and mood  No problems with memantine  Wife feels it has eased his agitation some Still gets some minor irritation at wife Mood is better  No improvement in memory on this though  Sugars better 107 this morning 124, 142 also  Current Outpatient Medications on File Prior to Visit  Medication Sig Dispense Refill   amLODipine  (NORVASC ) 10 MG tablet TAKE 1 TABLET BY MOUTH EVERY DAY 90 tablet 3   aspirin  EC 81 MG tablet Take 81 mg by mouth daily.     atorvastatin  (LIPITOR ) 80 MG tablet TAKE 1 TABLET BY MOUTH EVERY DAY 90 tablet 3   Cholecalciferol  (VITAMIN D3) 50 MCG (2000 UT) TABS Take 2,000 Units by mouth daily.     clopidogrel  (PLAVIX ) 75 MG tablet TAKE 1 TABLET BY MOUTH EVERY DAY 90 tablet 3   donepezil  (ARICEPT  ODT) 10 MG disintegrating tablet Take 1 tablet (10 mg total) by mouth at bedtime. 90 tablet 3   empagliflozin  (JARDIANCE ) 10 MG TABS tablet TAKE 1 TABLET BY MOUTH DAILY BEFORE BREAKFAST. 90 tablet 3   finasteride (PROSCAR) 5 MG tablet Take 5 mg by mouth daily.     glipiZIDE  (GLUCOTROL  XL) 10 MG 24 hr tablet Take 1 tablet (10 mg total) by mouth daily with breakfast. 90 tablet 3   hydrocortisone  2.5 % cream APPLY TOPICALLY 3 TIMES DAILY AS NEEDED. 28.35 g 3   ketoconazole  (NIZORAL ) 2 % shampoo APPLY TO 2 TO 3 TIMES A WEEK. LEAVE ON FOR 360 mL 2   lisinopril  (ZESTRIL ) 40 MG tablet TAKE 1 TABLET BY MOUTH EVERY DAY 90 tablet 3   memantine  (NAMENDA ) 5 MG tablet TAKE 1 TABLET BY MOUTH TWICE A DAY 180 tablet 1   metFORMIN  (GLUCOPHAGE -XR) 500 MG 24 hr tablet Take 2 tablets (1,000 mg total) by mouth daily with breakfast. 180 tablet 3   mometasone  (ELOCON ) 0.1 % cream APPLY 1 APPLICATION TOPICALLY 2 (TWO) TIMES DAILY AS NEEDED (SKIN). 45 g 1   ONETOUCH DELICA LANCETS FINE MISC Use 1 daily to obtain blood sample Dx Code E11.21 100 each 3    ONETOUCH VERIO test strip USE 1 STRIP DAILY TO CHECK BLOOD SUGAR DX CODE E11.21 100 strip 3   pioglitazone (ACTOS) 15 MG tablet Take 15 mg by mouth daily.     No current facility-administered medications on file prior to visit.    Allergies  Allergen Reactions   Morphine Other (See Comments)   Poison Ivy Extract Rash    Past Medical History:  Diagnosis Date   Cataract    in both eyes    had cataracts surgically removed   Complication of anesthesia    reports during prostate bx year ago , his BP dropped real low and he had to stay overnight after what was suposed to be an amulatory surgery    CVA (cerebral infarction)    Diabetes mellitus    ED (erectile dysfunction)    Elevated PSA    Frequency of urination    GERD (gastroesophageal reflux disease)    History of nephrolithiasis    Hyperlipidemia    Hypertension    Lung nodule    Psoriasis    PSVT (paroxysmal supraventricular tachycardia) (HCC)    Stroke (HCC)    2015   Stroke (HCC) 08/2017   reports no  lasting deficits since stroke, endorses that he feels back to regular self since before stroke     Past Surgical History:  Procedure Laterality Date   CATARACT EXTRACTION W/ INTRAOCULAR LENS IMPLANT Right 03/30/2012   Dr Rosan   COLONOSCOPY  09/07/2020   2019   ELECTROPHYSIOLOGIC STUDY N/A 09/24/2014   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   NM MYOVIEW LTD     normal EF 56% 03/08   PARATHYROIDECTOMY Left 02/28/2018   Procedure: LEFT INFERIOR PARATHYROIDECTOMY;  Surgeon: Eletha Boas, MD;  Location: [facility];  Service: General;  Laterality: Left;   POLYPECTOMY     PROSTATE BIOPSY  01/05/2012   Procedure: BIOPSY TRANSRECTAL ULTRASONIC PROSTATE (TUBP);  Surgeon: Noretta Ferrara, MD;  Location[facility];  Service: Urology;  Laterality: N/A;   PROSTATE BIOPSY N/A 10/14/2018   Procedure: BIOPSY TRANSRECTAL ULTRASONIC PROSTATE (TUBP);  Surgeon: Ferrara Glance, MD;  Location: [facility];  Service: Urology;   Laterality: N/A;   TEE WITHOUT CARDIOVERSION N/A 05/21/2014   Procedure: TRANSESOPHAGEAL ECHOCARDIOGRAM (TEE);  Surgeon: Oneil JAYSON Parchment, MD;  [facility] END[facility];  Service: Cardiovascular;  Laterality: N/A;   TONSILLECTOMY  1960    Family History  Problem Relation Age of Onset   Diabetes Mother    Breast cancer Mother    Colon polyps Brother    Heart disease Neg Hx    Colon cancer Neg Hx    Esophageal cancer Neg Hx    Rectal cancer Neg Hx    Stomach cancer Neg Hx     Social History   Socioeconomic History   Marital status: Married    Spouse name: Meade   Number of children: 2   Years of education: HS   Highest education level: Not on file  Occupational History   Occupation: holiday representative- [employer]    Comment: Retired  Tobacco Use   Smoking status: Former    Current packs/day: 1.00    Average packs/day: 1 pack/day for 5.0 years (5.0 ttl pk-yrs)    Types: Cigarettes    Passive exposure: Past (as a child)   Smokeless tobacco: Current    Types: Chew   Tobacco comments:    QUIT SMOKING CIGARETTES 50 YRS AGO--  CHEWED TOBACCO FOR 40 YRS  Vaping Use   Vaping status: Never Used  Substance and Sexual Activity   Alcohol  use: Not Currently    Alcohol /week: 0.0 standard drinks of alcohol    Drug use: No   Sexual activity: Not Currently  Other Topics Concern   Not on file  Social History Narrative   Has living will   Requests wife as health care POA. Children would be alternate   Would accept resuscitation attempts   Not sure about tube feeds      Lives at home with his wife   Right handed    Caffeine: occasional    Social Drivers of Corporate Investment Banker Strain: Not on file  Food Insecurity: Not on file  Transportation Needs: Not on file  Physical Activity: Not on file  Stress: Not on file  Social Connections: Not on file  Intimate Partner Violence: Not on file   Review of Systems Still drives around some--no trouble with this Sleeps okay    Objective:    Physical Exam Constitutional:      Appearance: Normal appearance.  Neurological:     Mental Status: He is alert.  Psychiatric:     Comments: Calm No depression or irritation  Assessment & Plan:

## 2023-03-29 NOTE — Telephone Encounter (Signed)
 Left a message on VM that it probably requires a PA and the pharmacy would need to send us  information in regards to doing that. Will forward to PA Team to see if they see a PA for him.

## 2023-04-04 ENCOUNTER — Other Ambulatory Visit (HOSPITAL_COMMUNITY): Payer: Self-pay

## 2023-04-04 ENCOUNTER — Telehealth: Payer: Self-pay

## 2023-04-04 NOTE — Telephone Encounter (Signed)
Pharmacy Patient Advocate Encounter   Received notification from Pt Calls Messages that prior authorization for JARDIANCE 10MG  is required/requested.   Insurance verification completed.   The patient is insured through [employer] .   Per test claim: Refill too soon. PA is not needed at this time. Medication was filled 03/26/23. Next eligible fill date is 06/02/23.

## 2023-04-08 ENCOUNTER — Other Ambulatory Visit: Payer: Self-pay | Admitting: Internal Medicine

## 2023-04-20 ENCOUNTER — Other Ambulatory Visit: Payer: Self-pay | Admitting: Internal Medicine

## 2023-04-23 DIAGNOSIS — R200 Anesthesia of skin: Secondary | ICD-10-CM | POA: Diagnosis not present

## 2023-04-23 DIAGNOSIS — E119 Type 2 diabetes mellitus without complications: Secondary | ICD-10-CM | POA: Diagnosis not present

## 2023-04-23 DIAGNOSIS — Z7984 Long term (current) use of oral hypoglycemic drugs: Secondary | ICD-10-CM | POA: Diagnosis not present

## 2023-06-26 ENCOUNTER — Ambulatory Visit (INDEPENDENT_AMBULATORY_CARE_PROVIDER_SITE_OTHER): Payer: Medicare HMO | Admitting: Internal Medicine

## 2023-06-26 ENCOUNTER — Encounter: Payer: Self-pay | Admitting: Internal Medicine

## 2023-06-26 VITALS — BP 132/88 | HR 68 | Temp 98.0°F | Ht 69.0 in | Wt 172.0 lb

## 2023-06-26 DIAGNOSIS — F01A11 Vascular dementia, mild, with agitation: Secondary | ICD-10-CM

## 2023-06-26 MED ORDER — ALPRAZOLAM 0.25 MG PO TABS: 0.2500 mg | ORAL_TABLET | Freq: Two times a day (BID) | ORAL | 0 refills | Status: AC | PRN

## 2023-06-26 NOTE — Assessment & Plan Note (Addendum)
 Some better with the memantine  but ongoing Discussed options --he still can get out in truck to decompress or sit outside Spells don't last long--quick anger On the donepezil  also  Will hold off on SSRI---also could consider prn alprazolam--but not sure she could get him to take it (will give Rx just in case) If worsens--will start sertraline 25mg  daily

## 2023-06-26 NOTE — Progress Notes (Signed)
 Subjective:    Patient ID: Tony Lias., male    DOB: 02/17/1941, 83 y.o.   MRN: [id number]  HPI Here for follow up of dementia and mood issues With wife  Last A1c down to 7.1% Discussed how this is excellent  Still gets irritated and agitated at times Wife not sure what triggers it Wife gets concerned--but he has never threatened to hit Sig spells perhaps every other day-stares or is confrontational (only with wife)  Wife is able to leave for 2-3 hours--shopping or out to lunch with girls He will drive---just local to ease his angst. Wife drives with him and he does okay--she does the directions Likes to sit in chair in carport also Memory still worsening  Current Outpatient Medications on File Prior to Visit  Medication Sig Dispense Refill   amLODipine  (NORVASC ) 10 MG tablet TAKE 1 TABLET BY MOUTH EVERY DAY 90 tablet 3   aspirin  EC 81 MG tablet Take 81 mg by mouth daily.     atorvastatin  (LIPITOR ) 80 MG tablet TAKE 1 TABLET BY MOUTH EVERY DAY 90 tablet 3   Cholecalciferol  (VITAMIN D3) 50 MCG (2000 UT) TABS Take 2,000 Units by mouth daily.     clopidogrel  (PLAVIX ) 75 MG tablet TAKE 1 TABLET BY MOUTH EVERY DAY 90 tablet 3   donepezil  (ARICEPT  ODT) 10 MG disintegrating tablet Take 1 tablet (10 mg total) by mouth at bedtime. 90 tablet 3   empagliflozin  (JARDIANCE ) 10 MG TABS tablet TAKE 1 TABLET BY MOUTH DAILY BEFORE BREAKFAST. 90 tablet 3   finasteride (PROSCAR) 5 MG tablet Take 5 mg by mouth daily.     glipiZIDE  (GLUCOTROL  XL) 10 MG 24 hr tablet Take 1 tablet (10 mg total) by mouth daily with breakfast. 90 tablet 3   hydrocortisone  2.5 % cream APPLY TOPICALLY 3 TIMES DAILY AS NEEDED. 28.35 g 3   ketoconazole  (NIZORAL ) 2 % shampoo APPLY TO 2 TO 3 TIMES A WEEK. LEAVE ON FOR 360 mL 2   lisinopril  (ZESTRIL ) 40 MG tablet TAKE 1 TABLET BY MOUTH EVERY DAY 90 tablet 3   memantine  (NAMENDA ) 10 MG tablet TAKE 1 TABLET BY MOUTH TWICE A DAY 180 tablet 3   metFORMIN   (GLUCOPHAGE -XR) 500 MG 24 hr tablet Take 2 tablets (1,000 mg total) by mouth daily with breakfast. 180 tablet 3   mometasone  (ELOCON ) 0.1 % cream APPLY 1 APPLICATION TOPICALLY 2 (TWO) TIMES DAILY AS NEEDED (SKIN). 45 g 1   ONETOUCH DELICA LANCETS FINE MISC Use 1 daily to obtain blood sample Dx Code E11.21 100 each 3   ONETOUCH VERIO test strip USE 1 STRIP DAILY TO CHECK BLOOD SUGAR DX CODE E11.21 100 strip 3   pioglitazone (ACTOS) 15 MG tablet Take 15 mg by mouth daily.     No current facility-administered medications on file prior to visit.    Allergies  Allergen Reactions   Morphine Other (See Comments)   Poison Ivy Extract Rash    Past Medical History:  Diagnosis Date   Cataract    in both eyes    had cataracts surgically removed   Complication of anesthesia    reports during prostate bx year ago , his BP "dropped real low" and he had to stay overnight after what was suposed to be an amulatory surgery    CVA (cerebral infarction)    Diabetes mellitus    ED (erectile dysfunction)    Elevated PSA    Frequency of urination    GERD (  gastroesophageal reflux disease)    History of nephrolithiasis    Hyperlipidemia    Hypertension    Lung nodule    Psoriasis    PSVT (paroxysmal supraventricular tachycardia) (HCC)    Stroke (HCC)    2015   Stroke (HCC) 08/2017   reports no lasting deficits since stroke, endorses that he feels back to regular self since before stroke     Past Surgical History:  Procedure Laterality Date   CATARACT EXTRACTION W/ INTRAOCULAR LENS IMPLANT Right 03/30/2012   Dr Matthew Songster   COLONOSCOPY  09/07/2020   2019   ELECTROPHYSIOLOGIC STUDY N/A 09/24/2014   AVNRT pathway by Dr Nunzio Belch   NM MYOVIEW  LTD     normal EF 56% 03/08   PARATHYROIDECTOMY Left 02/28/2018   Procedure: LEFT INFERIOR PARATHYROIDECTOMY;  Surgeon: Oralee Billow, MD;  Location: [facility];  Service: General;  Laterality: Left;   POLYPECTOMY     PROSTATE BIOPSY  01/05/2012   Procedure:  BIOPSY TRANSRECTAL ULTRASONIC PROSTATE (TUBP);  Surgeon: Kristeen Peto, MD;  Location: [facility];  Service: Urology;  Laterality: N/A;   PROSTATE BIOPSY N/A 10/14/2018   Procedure: BIOPSY TRANSRECTAL ULTRASONIC PROSTATE (TUBP);  Surgeon: Florencio Hunting, MD;  Location: [facility];  Service: Urology;  Laterality: N/A;   TEE WITHOUT CARDIOVERSION N/A 05/21/2014   Procedure: TRANSESOPHAGEAL ECHOCARDIOGRAM (TEE);  Surgeon: Hugh Madura, MD;  Location: [facility];  Service: Cardiovascular;  Laterality: N/A;   TONSILLECTOMY  1960    Family History  Problem Relation Age of Onset   Diabetes Mother    Breast cancer Mother    Colon polyps Brother    Heart disease Neg Hx    Colon cancer Neg Hx    Esophageal cancer Neg Hx    Rectal cancer Neg Hx    Stomach cancer Neg Hx     Social History   Socioeconomic History   Marital status: Married    Spouse name: Art Bigness   Number of children: 2   Years of education: HS   Highest education level: Not on file  Occupational History   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    Comment: Retired  Tobacco Use   Smoking status: Former    Current packs/day: 1.00    Average packs/day: 1 pack/day for 5.0 years (5.0 ttl pk-yrs)    Types: Cigarettes    Passive exposure: Past (as a child)   Smokeless tobacco: Current    Types: Chew   Tobacco comments:    QUIT SMOKING CIGARETTES 50 YRS AGO--  CHEWED TOBACCO FOR 40 YRS  Vaping Use   Vaping status: Never Used  Substance and Sexual Activity   Alcohol  use: Not Currently    Alcohol /week: 0.0 standard drinks of alcohol    Drug use: No   Sexual activity: Not Currently  Other Topics Concern   Not on file  Social History Narrative   Has living will   Requests wife as health care POA. Children would be alternate   Would accept resuscitation attempts   Not sure about tube feeds      Lives at home with his wife   Right handed    Caffeine: occasional    Social Drivers of Corporate investment banker  Strain: Not on file  Food Insecurity: Low Risk  (04/23/2023)   Received from [facility]   Hunger Vital Sign    Worried About Running Out of Food in the Last Year: Never true    Ran Out of Food in the Last  Year: Never true  Transportation Needs: No Transportation Needs (04/23/2023)   Received from [facility]    In the past 12 months, has lack of reliable transportation kept you from medical appointments, meetings, work or from getting things needed for daily living? : No  Physical Activity: Not on file  Stress: Not on file  Social Connections: Not on file  Intimate Partner Violence: Not on file   Review of Systems Sleeps okay Appetite off some---mostly grazes with snacks Weight stable    Objective:   Physical Exam Constitutional:      Appearance: Normal appearance.  Neurological:     Mental Status: He is alert.  Psychiatric:        Mood and Affect: Mood normal.     Comments: Limited insight--hard to judge judgement            Assessment & Plan:

## 2023-06-29 ENCOUNTER — Telehealth: Payer: Self-pay

## 2023-06-29 NOTE — Progress Notes (Signed)
 Complex Care Management Note  Care Guide Note 06/29/2023 Name: Tony Jackson. MRN: [id number] DOB: October 01, 1940  Tony Jackson. is a 83 y.o. year old male who sees Helaine Llanos, MD for primary care. I reached out to Tony Jackson. by phone today to offer complex care management services.  [name] was given information about Complex Care Management services today including:   The Complex Care Management services include support from the care team which includes your Nurse Care Manager, Clinical Social Worker, or Pharmacist.  The Complex Care Management team is here to help remove barriers to the health concerns and goals most important to you. Complex Care Management services are voluntary, and the patient may decline or stop services at any time by request to their care team member.   Complex Care Management Consent Status: Patient agreed to services and verbal consent obtained.   Follow up plan:  Telephone appointment with complex care management team member scheduled for:  07/24/2023  Encounter Outcome:  Patient Scheduled  Lenton Rail , RMA     [place]  [facility], [facility] Guide  Direct Dial: [phone]  Website: [email or web address]

## 2023-07-18 ENCOUNTER — Encounter: Payer: Self-pay | Admitting: Physician Assistant

## 2023-07-21 ENCOUNTER — Other Ambulatory Visit: Payer: Self-pay | Admitting: Physician Assistant

## 2023-07-24 ENCOUNTER — Telehealth: Payer: Self-pay | Admitting: *Deleted

## 2023-07-24 ENCOUNTER — Encounter: Payer: Self-pay | Admitting: *Deleted

## 2023-07-25 ENCOUNTER — Telehealth: Payer: Self-pay

## 2023-07-25 ENCOUNTER — Encounter: Payer: Self-pay | Admitting: Internal Medicine

## 2023-07-25 DIAGNOSIS — F01511 Vascular dementia, unspecified severity, with agitation: Secondary | ICD-10-CM

## 2023-07-25 NOTE — Telephone Encounter (Signed)
 Copied from CRM [id number]. Topic: Clinical - Medication Question >> Jul 25, 2023  3:23 PM Jim Motts C wrote: Reason for CRM: Tony Jackson, wife of patient, asking if Dr.Letvak can give a call to her. If they can downsize medicine due to patient's aggression- trouble with Alzehimers and Dementia- and can help with the patients moods. Patients takes a lot of medicine and Tony Jackson wants to see if there is something that can be eliminated.   Wife has concerns about patient's behavior- threatening but not physically violent.Wife doesn't want him to get started on the anxiety medication, if it's going to make his moods worse, and for him to be as normal as he can be. Patient's wife would appreciate a call back, to see if any medication can be eliminated to help with a balance and to see if it is possible for them to also see Dr. Joelle Musca. [phone].

## 2023-07-25 NOTE — Telephone Encounter (Signed)
 Left message on VM for pt's wife advising her that Dr Joelle Musca has left for the day is out of the office for the next 1.5 weeks. I am not sure if he will see his messages this evening or not. Another provider will be working his inbasket while he is gone, but I am not sure how they will feel about making changes.

## 2023-07-30 ENCOUNTER — Telehealth: Payer: Self-pay

## 2023-07-30 NOTE — Progress Notes (Signed)
 Complex Care Management Care Guide Note  07/30/2023 Name: Tony Jackson. MRN: [id number] DOB: Jun 08, 1940  Tony Lias. is a 83 y.o. year old male who is a primary care patient of Helaine Llanos, MD and is actively engaged with the care management team. I reached out to Tony Lias. by phone today to assist with re-scheduling  with the Licensed Clinical Social Worker.  Follow up plan: Telephone appointment with complex care management team member scheduled for:  08/14/23 at 2:30 p.m.   [facility]  [facility], [facility] Care Management Assistant Direct Dial: [phone]  Fax: [phone]

## 2023-08-03 NOTE — Telephone Encounter (Signed)
 Spoke to pt's wife. She said they do have a virtual visit scheduled with the social worker next week. She said things are hit and miss.

## 2023-08-06 NOTE — Telephone Encounter (Signed)
 Spoke to pt's wife. She said she has not been giving him the alprazolam  very much. Says it is hard to get him to take anything. BUt, she will try to do it more regularly. Will make an OV if that does not help.

## 2023-08-13 ENCOUNTER — Encounter (HOSPITAL_BASED_OUTPATIENT_CLINIC_OR_DEPARTMENT_OTHER): Payer: Self-pay | Admitting: Emergency Medicine

## 2023-08-13 ENCOUNTER — Other Ambulatory Visit: Payer: Self-pay

## 2023-08-13 ENCOUNTER — Emergency Department (HOSPITAL_BASED_OUTPATIENT_CLINIC_OR_DEPARTMENT_OTHER): Admitting: Radiology

## 2023-08-13 ENCOUNTER — Emergency Department (HOSPITAL_BASED_OUTPATIENT_CLINIC_OR_DEPARTMENT_OTHER)
Admission: EM | Admit: 2023-08-13 | Discharge: 2023-08-13 | Disposition: A | Discharge status: Home / Self Care | Attending: Emergency Medicine | Admitting: Emergency Medicine

## 2023-08-13 DIAGNOSIS — F0150 Vascular dementia without behavioral disturbance: Secondary | ICD-10-CM | POA: Insufficient documentation

## 2023-08-13 DIAGNOSIS — Z7982 Long term (current) use of aspirin: Secondary | ICD-10-CM | POA: Diagnosis not present

## 2023-08-13 DIAGNOSIS — Z87891 Personal history of nicotine dependence: Secondary | ICD-10-CM | POA: Diagnosis not present

## 2023-08-13 DIAGNOSIS — E1122 Type 2 diabetes mellitus with diabetic chronic kidney disease: Secondary | ICD-10-CM | POA: Insufficient documentation

## 2023-08-13 DIAGNOSIS — R600 Localized edema: Secondary | ICD-10-CM | POA: Insufficient documentation

## 2023-08-13 DIAGNOSIS — E1140 Type 2 diabetes mellitus with diabetic neuropathy, unspecified: Secondary | ICD-10-CM | POA: Insufficient documentation

## 2023-08-13 DIAGNOSIS — Z7984 Long term (current) use of oral hypoglycemic drugs: Secondary | ICD-10-CM | POA: Insufficient documentation

## 2023-08-13 DIAGNOSIS — N1832 Chronic kidney disease, stage 3b: Secondary | ICD-10-CM | POA: Diagnosis not present

## 2023-08-13 DIAGNOSIS — Z79899 Other long term (current) drug therapy: Secondary | ICD-10-CM | POA: Insufficient documentation

## 2023-08-13 DIAGNOSIS — Z8673 Personal history of transient ischemic attack (TIA), and cerebral infarction without residual deficits: Secondary | ICD-10-CM | POA: Insufficient documentation

## 2023-08-13 DIAGNOSIS — S2241XA Multiple fractures of ribs, right side, initial encounter for closed fracture: Secondary | ICD-10-CM | POA: Diagnosis not present

## 2023-08-13 DIAGNOSIS — I493 Ventricular premature depolarization: Secondary | ICD-10-CM | POA: Insufficient documentation

## 2023-08-13 DIAGNOSIS — I129 Hypertensive chronic kidney disease with stage 1 through stage 4 chronic kidney disease, or unspecified chronic kidney disease: Secondary | ICD-10-CM | POA: Diagnosis not present

## 2023-08-13 DIAGNOSIS — R002 Palpitations: Secondary | ICD-10-CM | POA: Diagnosis not present

## 2023-08-13 DIAGNOSIS — E1165 Type 2 diabetes mellitus with hyperglycemia: Secondary | ICD-10-CM | POA: Diagnosis not present

## 2023-08-13 DIAGNOSIS — Z7901 Long term (current) use of anticoagulants: Secondary | ICD-10-CM | POA: Insufficient documentation

## 2023-08-13 DIAGNOSIS — R9431 Abnormal electrocardiogram [ECG] [EKG]: Secondary | ICD-10-CM | POA: Diagnosis not present

## 2023-08-13 LAB — CBC
HCT: 43.3 % (ref 39.0–52.0)
Hemoglobin: 14.5 g/dL (ref 13.0–17.0)
MCH: 31.9 pg (ref 26.0–34.0)
MCHC: 33.5 g/dL (ref 30.0–36.0)
MCV: 95.4 fL (ref 80.0–100.0)
Platelets: 213 10*3/uL (ref 150–400)
RBC: 4.54 MIL/uL (ref 4.22–5.81)
RDW: 13.4 % (ref 11.5–15.5)
WBC: 7.7 10*3/uL (ref 4.0–10.5)
nRBC: 0 % (ref 0.0–0.2)

## 2023-08-13 LAB — BASIC METABOLIC PANEL WITH GFR
Anion gap: 17 — ABNORMAL HIGH (ref 5–15)
BUN: 34 mg/dL — ABNORMAL HIGH (ref 8–23)
CO2: 21 mmol/L — ABNORMAL LOW (ref 22–32)
Calcium: 9.9 mg/dL (ref 8.9–10.3)
Chloride: 104 mmol/L (ref 98–111)
Creatinine, Ser: 1.9 mg/dL — ABNORMAL HIGH (ref 0.61–1.24)
GFR, Estimated: 35 mL/min — ABNORMAL LOW (ref >60)
Glucose, Bld: 81 mg/dL (ref 70–99)
Potassium: 4.3 mmol/L (ref 3.5–5.1)
Sodium: 141 mmol/L (ref 135–145)

## 2023-08-13 LAB — TROPONIN T, HIGH SENSITIVITY
Troponin T High Sensitivity: 34 ng/L — ABNORMAL HIGH (ref <19)
Troponin T High Sensitivity: 33 ng/L — ABNORMAL HIGH (ref <19)

## 2023-08-13 NOTE — ED Triage Notes (Signed)
 Seen at endocrinology Was told ecg was abnormal,   Feeling ok sinus with 1st degree, PVCs

## 2023-08-13 NOTE — Discharge Instructions (Addendum)
 We evaluated you for your abnormal EKG.  Your EKG shows few premature ventricular contractions.  These are a type of premature beats.  These are not considered dangerous.  We do not think that you need to stay in the hospital for this.  Your other testing was reassuring.  Please follow-up closely with your primary physician.  If you do notice any new symptoms like severe pain, chest pain, difficulty breathing, lightheadedness or dizziness, fainting, fevers or chills, confusion, weakness, or any other new symptoms complete return to the emergency department

## 2023-08-13 NOTE — ED Provider Notes (Signed)
 [place] EMERGENCY DEPARTMENT AT [facility] Provider Note  CSN: [id number] Arrival date & time: 08/13/23 1715  Chief Complaint(s) Abnormal ECG  HPI Tony Jackson. is a 83 y.o. male history of CKD, prior stroke, diabetes, hypertension, hyperlipidemia, dementia presenting to the emergency department with abnormal EKG.  Patient was at his endocrinology appointment today.  His endocrinologist apparently felt an irregular heart rate and obtained an EKG, which showed premature ventricular contraction.  They advised him to come to the emergency department.  Per patient and wife, he has been at his baseline state of health.  He denies any chest pain, shortness of breath, fevers, chills, lightheadedness, dizziness, decreased exercise tolerance, syncope, near syncope, abdominal pain, back pain, or any other new symptoms.  He does have some mild chronic leg swelling bilaterally which is unchanged.   Past Medical History Past Medical History:  Diagnosis Date   Cataract    in both eyes    had cataracts surgically removed   Complication of anesthesia    reports during prostate bx year ago , his BP dropped real low and he had to stay overnight after what was suposed to be an amulatory surgery    CVA (cerebral infarction)    Diabetes mellitus    ED (erectile dysfunction)    Elevated PSA    Frequency of urination    GERD (gastroesophageal reflux disease)    History of nephrolithiasis    Hyperlipidemia    Hypertension    Lung nodule    Psoriasis    PSVT (paroxysmal supraventricular tachycardia) (HCC)    Stroke (HCC)    2015   Stroke (HCC) 08/2017   reports no lasting deficits since stroke, endorses that he feels back to regular self since before stroke    Patient Active Problem List   Diagnosis Date Noted   Vascular dementia with agitation (HCC) 03/01/2023   Diabetes mellitus treated with oral medication (HCC) 10/31/2022   Tinnitus 10/31/2022   Urinary incontinence 06/26/2022    Secondary hyperparathyroidism, renal (HCC) 09/26/2021   MCI (mild cognitive impairment) 08/31/2020   Neuropathy 04/16/2019   Post herpetic neuralgia 11/20/2018   Vitamin D  deficiency 06/18/2017   Noncompliance with medications 08/04/2016   Hyperkalemia 08/03/2016   Stage 3b chronic kidney disease (HCC) 01/27/2015   Advance directive discussed with patient 01/27/2015   SVT (supraventricular tachycardia) (HCC) 09/24/2014   Essential hypertension    Late effect of cerebrovascular accident (CVA)    Pulmonary nodules 10/21/2013   CAD (coronary artery disease) 10/08/2013   Prostatic intraepithelial neoplasia 04/12/2012   Actinic keratosis 10/12/2011   Routine general medical examination at a health care facility 10/07/2010   NEPHROLITHIASIS, HX OF 03/18/2010   ERECTILE DYSFUNCTION, ORGANIC 02/19/2008   Type 2 diabetes mellitus with renal manifestations (HCC) 11/13/2006   Hyperlipemia 11/13/2006   Seborrheic dermatitis 11/13/2006   Home Medication(s) Prior to Admission medications   Medication Sig Start Date End Date Taking? Authorizing Provider  ALPRAZolam  (XANAX ) 0.25 MG tablet Take 1 tablet (0.25 mg total) by mouth 2 (two) times daily as needed for anxiety. 06/26/23   Jimmy Charlie FERNS, MD  amLODipine  (NORVASC ) 10 MG tablet TAKE 1 TABLET BY MOUTH EVERY DAY 03/29/23   Jimmy Charlie I, MD  aspirin  EC 81 MG tablet Take 81 mg by mouth daily.    [provider]  atorvastatin  (LIPITOR ) 80 MG tablet TAKE 1 TABLET BY MOUTH EVERY DAY 01/01/23   Jimmy Charlie FERNS, MD  Cholecalciferol  (VITAMIN D3) 50 MCG (  2000 UT) TABS Take 2,000 Units by mouth daily.    [provider]  clopidogrel  (PLAVIX ) 75 MG tablet TAKE 1 TABLET BY MOUTH EVERY DAY 03/29/23   Jimmy Charlie FERNS, MD  donepezil  (ARICEPT  ODT) 10 MG disintegrating tablet Take 1 tablet (10 mg total) by mouth at bedtime. 10/31/22   Jimmy Charlie FERNS, MD  empagliflozin  (JARDIANCE ) 10 MG TABS tablet TAKE 1 TABLET BY MOUTH DAILY BEFORE  BREAKFAST. 03/26/23   Jimmy Charlie I, MD  finasteride (PROSCAR) 5 MG tablet Take 5 mg by mouth daily.    [provider]  glipiZIDE  (GLUCOTROL  XL) 10 MG 24 hr tablet Take 1 tablet (10 mg total) by mouth daily with breakfast. 06/26/22   Jimmy Charlie I, MD  hydrocortisone  2.5 % cream APPLY TOPICALLY 3 TIMES DAILY AS NEEDED. 11/07/19   Jimmy Charlie I, MD  ketoconazole  (NIZORAL ) 2 % shampoo APPLY TO 2 TO 3 TIMES A WEEK. LEAVE ON FOR 05/31/22   Jimmy Charlie I, MD  lisinopril  (ZESTRIL ) 40 MG tablet TAKE 1 TABLET BY MOUTH EVERY DAY 11/06/22   Jimmy Charlie I, MD  memantine  (NAMENDA ) 10 MG tablet TAKE 1 TABLET BY MOUTH TWICE A DAY 04/20/23   Jimmy Charlie FERNS, MD  metFORMIN  (GLUCOPHAGE -XR) 500 MG 24 hr tablet Take 2 tablets (1,000 mg total) by mouth daily with breakfast. 07/31/22   Jimmy Charlie FERNS, MD  mometasone  (ELOCON ) 0.1 % cream APPLY 1 APPLICATION TOPICALLY 2 (TWO) TIMES DAILY AS NEEDED (SKIN). 05/09/22   Jimmy Charlie FERNS, MD  [facility] DELICA LANCETS FINE MISC Use 1 daily to obtain blood sample Dx Code E11.21 08/10/16   Jimmy Charlie FERNS, MD  [facility] VERIO test strip USE 1 STRIP DAILY TO CHECK BLOOD SUGAR DX CODE E11.21 05/12/21   Jimmy Charlie I, MD  pioglitazone (ACTOS) 15 MG tablet Take 15 mg by mouth daily.    [provider]                                                                                                                                    Past Surgical History Past Surgical History:  Procedure Laterality Date   CATARACT EXTRACTION W/ INTRAOCULAR LENS IMPLANT Right 03/30/2012   Dr Rosan   COLONOSCOPY  09/07/2020   2019   ELECTROPHYSIOLOGIC STUDY N/A 09/24/2014   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   NM MYOVIEW LTD     normal EF 56% 03/08   PARATHYROIDECTOMY Left 02/28/2018   Procedure: LEFT INFERIOR PARATHYROIDECTOMY;  Surgeon: Eletha Boas, MD;  Location: [facility];  Service: General;  Laterality: Left;   POLYPECTOMY     PROSTATE BIOPSY   01/05/2012   Procedure: BIOPSY TRANSRECTAL ULTRASONIC PROSTATE (TUBP);  Surgeon: Noretta Ferrara, MD;  Location[facility];  Service: Urology;  Laterality: N/A;   PROSTATE BIOPSY N/A 10/14/2018   Procedure: BIOPSY TRANSRECTAL ULTRASONIC PROSTATE (TUBP);  Surgeon: Ferrara Glance, MD;  Location:  [facility];  Service: Urology;  Laterality: N/A;   TEE WITHOUT CARDIOVERSION N/A 05/21/2014   Procedure: TRANSESOPHAGEAL ECHOCARDIOGRAM (TEE);  Surgeon: Oneil JAYSON Parchment, MD;  [facility];  Service: Cardiovascular;  Laterality: N/A;   TONSILLECTOMY  1960   Family History Family History  Problem Relation Age of Onset   Diabetes Mother    Breast cancer Mother    Colon polyps Brother    Heart disease Neg Hx    Colon cancer Neg Hx    Esophageal cancer Neg Hx    Rectal cancer Neg Hx    Stomach cancer Neg Hx     Social History Social History   Tobacco Use   Smoking status: Former    Current packs/day: 1.00    Average packs/day: 1 pack/day for 5.0 years (5.0 ttl pk-yrs)    Types: Cigarettes    Passive exposure: Past (as a child)   Smokeless tobacco: Current    Types: Chew   Tobacco comments:    QUIT SMOKING CIGARETTES 50 YRS AGO--  CHEWED TOBACCO FOR 40 YRS  Vaping Use   Vaping status: Never Used  Substance Use Topics   Alcohol  use: Not Currently    Alcohol /week: 0.0 standard drinks of alcohol    Drug use: No   Allergies Morphine and Poison ivy extract  Review of Systems Review of Systems  All other systems reviewed and are negative.   Physical Exam Vital Signs  I have reviewed the triage vital signs BP (!) 176/89   Pulse 76   Temp 98.4 F (36.9 C)   Resp 18   SpO2 96%  Physical Exam Vitals and nursing note reviewed.  Constitutional:      General: He is not in acute distress.    Appearance: Normal appearance.  HENT:     Mouth/Throat:     Mouth: Mucous membranes are moist.   Eyes:     Conjunctiva/sclera: Conjunctivae normal.    Cardiovascular:      Rate and Rhythm: Normal rate. Rhythm irregular.     Heart sounds: No murmur heard. Pulmonary:     Effort: Pulmonary effort is normal. No respiratory distress.     Breath sounds: Normal breath sounds.  Abdominal:     General: Abdomen is flat.     Palpations: Abdomen is soft.     Tenderness: There is no abdominal tenderness.   Musculoskeletal:     Right lower leg: Edema present.     Left lower leg: Edema present.     Comments: Trace bilateral lower extremity edema   Skin:    General: Skin is warm and dry.     Capillary Refill: Capillary refill takes less than 2 seconds.   Neurological:     Mental Status: He is alert and oriented to person, place, and time. Mental status is at baseline.   Psychiatric:        Mood and Affect: Mood normal.        Behavior: Behavior normal.     ED Results and Treatments Labs (all labs ordered are listed, but only abnormal results are displayed) Labs Reviewed  BASIC METABOLIC PANEL WITH GFR - Abnormal; Notable for the following components:      Result Value   CO2 21 (*)    BUN 34 (*)    Creatinine, Ser 1.90 (*)    GFR, Estimated 35 (*)    Anion gap 17 (*)    All other components within normal limits  TROPONIN T, HIGH SENSITIVITY - Abnormal;  Notable for the following components:   Troponin T High Sensitivity 34 (*)    All other components within normal limits  TROPONIN T, HIGH SENSITIVITY - Abnormal; Notable for the following components:   Troponin T High Sensitivity 33 (*)    All other components within normal limits  CBC                                                                                                                          Radiology DG Chest 2 View Result Date: 08/13/2023 CLINICAL DATA:  Patient was told EKG was abnormal. EXAM: CHEST - 2 VIEW COMPARISON:  Jul 17, 2014 FINDINGS: The heart size and mediastinal contours are within normal limits. A radiopaque loop recorder device is seen. Both lungs are clear. Multiple  chronic right-sided rib fractures are noted. No acute osseous abnormalities are identified. IMPRESSION: No active cardiopulmonary disease. Electronically Signed   By: Suzen Dials M.D.   On: 08/13/2023 18:06    Pertinent labs & imaging results that were available during my care of the patient were reviewed by me and considered in my medical decision making (see MDM for details).  Medications Ordered in ED Medications - No data to display                                                                                                                                   Procedures Procedures  (including critical care time)  Medical Decision Making / ED Course   MDM:  83 year old presenting to the emergency department with abnormal EKG.  EKG shows first-degree AV block, occasional PVCs.  Patient and wife report he is asymptomatic and has not developed any new symptoms.  He has been in his baseline state of health.  Without any specific symptoms, low concern for any underlying dangerous process.  He has not had any presyncopal or syncopal type symptoms to suggest symptomatic PVCs.  His EKG otherwise has no significant change from prior.    Troponin was ordered by nursing in triage even though patient has no chest pain or other symptoms.  It was mildly elevated likely due to patient's underlying CKD.  This was repeated and it has been stable.  The patient and wife request to go home.  I do not think there is any indication for further workup in the emergency department as the patient has no symptoms and otherwise very  well-appearing.  Recommended follow-up with primary physician and return to the emergency department should he develop any new symptoms. Will discharge patient to home. All questions answered. Patient comfortable with plan of discharge. Return precautions discussed with patient and specified on the after visit summary.       Additional history obtained: -Additional history  obtained from spouse -External records from outside source obtained and reviewed including: Chart review including previous notes, labs, imaging, consultation notes including endocrinology notes from today   Lab Tests: -I ordered, reviewed, and interpreted labs.   The pertinent results include:   Labs Reviewed  BASIC METABOLIC PANEL WITH GFR - Abnormal; Notable for the following components:      Result Value   CO2 21 (*)    BUN 34 (*)    Creatinine, Ser 1.90 (*)    GFR, Estimated 35 (*)    Anion gap 17 (*)    All other components within normal limits  TROPONIN T, HIGH SENSITIVITY - Abnormal; Notable for the following components:   Troponin T High Sensitivity 34 (*)    All other components within normal limits  TROPONIN T, HIGH SENSITIVITY - Abnormal; Notable for the following components:   Troponin T High Sensitivity 33 (*)    All other components within normal limits  CBC    Notable for stable CKD. Nonspecific mild troponin elevation likely due to CKD   EKG   EKG Interpretation Date/Time:  Monday August 13 2023 17:23:42 EDT Ventricular Rate:  87 PR Interval:  224 QRS Duration:  82 QT Interval:  370 QTC Calculation: 445 R Axis:   9  Text Interpretation: Sinus rhythm with 1st degree A-V block with frequent Premature ventricular complexes Otherwise normal ECG When compared with ECG of 10-Oct-2018 09:15, Premature ventricular complexes are now Present Confirmed by Francesca Fallow ([id number]) on 08/13/2023 7:11:07 PM         Medicines ordered and prescription drug management: No orders of the defined types were placed in this encounter.   -I have reviewed the patients home medicines and have made adjustments as needed   Cardiac Monitoring: The patient was maintained on a cardiac monitor.  I personally viewed and interpreted the cardiac monitored which showed an underlying rhythm of: NSR w PVC   Co morbidities that complicate the patient evaluation  Past Medical History:   Diagnosis Date   Cataract    in both eyes    had cataracts surgically removed   Complication of anesthesia    reports during prostate bx year ago , his BP dropped real low and he had to stay overnight after what was suposed to be an amulatory surgery    CVA (cerebral infarction)    Diabetes mellitus    ED (erectile dysfunction)    Elevated PSA    Frequency of urination    GERD (gastroesophageal reflux disease)    History of nephrolithiasis    Hyperlipidemia    Hypertension    Lung nodule    Psoriasis    PSVT (paroxysmal supraventricular tachycardia) (HCC)    Stroke (HCC)    2015   Stroke (HCC) 08/2017   reports no lasting deficits since stroke, endorses that he feels back to regular self since before stroke       Dispostion: Disposition decision including need for hospitalization was considered, and patient discharged from emergency department.    Final Clinical Impression(s) / ED Diagnoses Final diagnoses:  Premature ventricular contraction     This chart was dictated using  voice recognition software.  Despite best efforts to proofread,  errors can occur which can change the documentation meaning.    Francesca Elsie CROME, MD 08/13/23 2100

## 2023-08-14 ENCOUNTER — Other Ambulatory Visit: Payer: Self-pay | Admitting: *Deleted

## 2023-08-14 NOTE — Patient Outreach (Signed)
 Patient's spouse contacted to assess for community resource needs. Patient's spouse declined enrollment at this time. Patient's spouse did request resources for caregiver support classes which were mailed. CSW will follow up with patient's spouse within 4-6 weeks to follow up on any additional community resource needs.  Ha Shannahan, LCSW [place]  [facility], [facility] Licensed Clinical Social Worker  Direct Dial: [phone]

## 2023-08-14 NOTE — Patient Instructions (Signed)
 Visit Information  Thank you for taking time to visit with me today. Please don't hesitate to contact me if I can be of assistance to you before our next scheduled appointment.   Closing From: Complex Care Management. Patient's spouse declined need for follow up at this time but agrees to contact this social worker if any additional needs may arise.  Please call the care guide team at [phone] if you need to cancel, schedule, or reschedule an appointment.   Please call 911 if you are experiencing a Mental Health or Behavioral Health Crisis or need someone to talk to.  Jericka Kadar, LCSW [place]  [facility], [facility] Licensed Clinical Social Worker  Direct Dial: [phone]

## 2023-08-26 DIAGNOSIS — I493 Ventricular premature depolarization: Secondary | ICD-10-CM | POA: Diagnosis not present

## 2023-08-26 DIAGNOSIS — I440 Atrioventricular block, first degree: Secondary | ICD-10-CM | POA: Diagnosis not present

## 2023-09-12 ENCOUNTER — Ambulatory Visit: Payer: Medicare HMO | Admitting: Physician Assistant

## 2023-09-13 DIAGNOSIS — Z7984 Long term (current) use of oral hypoglycemic drugs: Secondary | ICD-10-CM | POA: Diagnosis not present

## 2023-09-13 DIAGNOSIS — N3941 Urge incontinence: Secondary | ICD-10-CM | POA: Diagnosis not present

## 2023-09-13 DIAGNOSIS — Z8673 Personal history of transient ischemic attack (TIA), and cerebral infarction without residual deficits: Secondary | ICD-10-CM | POA: Diagnosis not present

## 2023-09-13 DIAGNOSIS — Z8249 Family history of ischemic heart disease and other diseases of the circulatory system: Secondary | ICD-10-CM | POA: Diagnosis not present

## 2023-09-13 DIAGNOSIS — Z7982 Long term (current) use of aspirin: Secondary | ICD-10-CM | POA: Diagnosis not present

## 2023-09-13 DIAGNOSIS — E1142 Type 2 diabetes mellitus with diabetic polyneuropathy: Secondary | ICD-10-CM | POA: Diagnosis not present

## 2023-09-13 DIAGNOSIS — G309 Alzheimer's disease, unspecified: Secondary | ICD-10-CM | POA: Diagnosis not present

## 2023-09-13 DIAGNOSIS — N400 Enlarged prostate without lower urinary tract symptoms: Secondary | ICD-10-CM | POA: Diagnosis not present

## 2023-09-13 DIAGNOSIS — Z87891 Personal history of nicotine dependence: Secondary | ICD-10-CM | POA: Diagnosis not present

## 2023-09-13 DIAGNOSIS — Z823 Family history of stroke: Secondary | ICD-10-CM | POA: Diagnosis not present

## 2023-09-13 DIAGNOSIS — Z833 Family history of diabetes mellitus: Secondary | ICD-10-CM | POA: Diagnosis not present

## 2023-09-13 DIAGNOSIS — E785 Hyperlipidemia, unspecified: Secondary | ICD-10-CM | POA: Diagnosis not present

## 2023-09-26 ENCOUNTER — Encounter: Admitting: Internal Medicine

## 2023-10-02 ENCOUNTER — Ambulatory Visit: Admitting: Internal Medicine

## 2023-10-02 ENCOUNTER — Encounter: Payer: Self-pay | Admitting: Internal Medicine

## 2023-10-02 VITALS — BP 124/80 | HR 71 | Temp 98.6°F | Ht 69.0 in | Wt 166.0 lb

## 2023-10-02 DIAGNOSIS — Z0000 Encounter for general adult medical examination without abnormal findings: Secondary | ICD-10-CM

## 2023-10-02 DIAGNOSIS — I10 Essential (primary) hypertension: Secondary | ICD-10-CM

## 2023-10-02 DIAGNOSIS — E1121 Type 2 diabetes mellitus with diabetic nephropathy: Secondary | ICD-10-CM

## 2023-10-02 DIAGNOSIS — R21 Rash and other nonspecific skin eruption: Secondary | ICD-10-CM | POA: Diagnosis not present

## 2023-10-02 DIAGNOSIS — I471 Supraventricular tachycardia, unspecified: Secondary | ICD-10-CM | POA: Diagnosis not present

## 2023-10-02 DIAGNOSIS — Z0001 Encounter for general adult medical examination with abnormal findings: Secondary | ICD-10-CM | POA: Diagnosis not present

## 2023-10-02 DIAGNOSIS — N1832 Chronic kidney disease, stage 3b: Secondary | ICD-10-CM | POA: Diagnosis not present

## 2023-10-02 DIAGNOSIS — Z7984 Long term (current) use of oral hypoglycemic drugs: Secondary | ICD-10-CM

## 2023-10-02 DIAGNOSIS — F01B11 Vascular dementia, moderate, with agitation: Secondary | ICD-10-CM | POA: Diagnosis not present

## 2023-10-02 LAB — HM DIABETES FOOT EXAM

## 2023-10-02 MED ORDER — KETOCONAZOLE 2 % EX CREA: 1.0000 | TOPICAL_CREAM | Freq: Two times a day (BID) | CUTANEOUS | 1 refills | Status: AC

## 2023-10-02 MED ORDER — METFORMIN HCL ER 500 MG PO TB24: 500.0000 mg | ORAL_TABLET | Freq: Every day | ORAL | 3 refills | Status: AC

## 2023-10-02 NOTE — Progress Notes (Signed)
 Subjective:    Patient ID: Tony JAYSON Vannie Mickey., male    DOB: 1940-04-28, 83 y.o.   MRN: [id number]  HPI Here for Medicare wellness visit and follow up of chronic health conditions With wife Reviewed advanced directives Reviewed other doctors---Ms Hyacinth ([facility]), Dr Cyndy, Camie Wertman--neurology, Dr Patrcia -ophthal Was seen in ER in June---sent by endocrine due to arrhythmia. PVCs diagnosed.  No hospitalizations or surgery in the past year No alcohol . Still chews tobacco all the time Vision is okay Hearing is not great--he doesn't realize it No exercise ---trouble walking due to neuropathy. Work boots help stability No falls Some depression per wife--he denies. Gets quiet and mood flares (anger). No insight. Still enjoys truck, TV  Not checking sugars much They do seem to be better Metformin  cut back to one daily  Memory continues to decline Keeps up with neurology Still drives --usually with someone else but not always Independent with ADLs Does a bit of yard work---occasionally rides lawn tractor  GFR 32-43 over the past few years Known elevated PTH as well  Current Outpatient Medications on File Prior to Visit  Medication Sig Dispense Refill   ALPRAZolam  (XANAX ) 0.25 MG tablet Take 1 tablet (0.25 mg total) by mouth 2 (two) times daily as needed for anxiety. 20 tablet 0   amLODipine  (NORVASC ) 10 MG tablet TAKE 1 TABLET BY MOUTH EVERY DAY 90 tablet 3   aspirin  EC 81 MG tablet Take 81 mg by mouth daily.     atorvastatin  (LIPITOR ) 80 MG tablet TAKE 1 TABLET BY MOUTH EVERY DAY 90 tablet 3   Cholecalciferol  (VITAMIN D3) 50 MCG (2000 UT) TABS Take 2,000 Units by mouth daily.     clopidogrel  (PLAVIX ) 75 MG tablet TAKE 1 TABLET BY MOUTH EVERY DAY 90 tablet 3   donepezil  (ARICEPT  ODT) 10 MG disintegrating tablet Take 1 tablet (10 mg total) by mouth at bedtime. 90 tablet 3   empagliflozin  (JARDIANCE ) 10 MG TABS tablet TAKE 1 TABLET BY MOUTH DAILY BEFORE  BREAKFAST. 90 tablet 3   finasteride (PROSCAR) 5 MG tablet Take 5 mg by mouth daily.     glipiZIDE  (GLUCOTROL  XL) 10 MG 24 hr tablet Take 1 tablet (10 mg total) by mouth daily with breakfast. 90 tablet 3   hydrocortisone  2.5 % cream APPLY TOPICALLY 3 TIMES DAILY AS NEEDED. 28.35 g 3   ketoconazole  (NIZORAL ) 2 % shampoo APPLY TO 2 TO 3 TIMES A WEEK. LEAVE ON FOR 360 mL 2   lisinopril  (ZESTRIL ) 40 MG tablet TAKE 1 TABLET BY MOUTH EVERY DAY 90 tablet 3   memantine  (NAMENDA ) 10 MG tablet TAKE 1 TABLET BY MOUTH TWICE A DAY 180 tablet 3   mometasone  (ELOCON ) 0.1 % cream APPLY 1 APPLICATION TOPICALLY 2 (TWO) TIMES DAILY AS NEEDED (SKIN). 45 g 1   ONETOUCH DELICA LANCETS FINE MISC Use 1 daily to obtain blood sample Dx Code E11.21 100 each 3   ONETOUCH VERIO test strip USE 1 STRIP DAILY TO CHECK BLOOD SUGAR DX CODE E11.21 100 strip 3   pioglitazone (ACTOS) 15 MG tablet Take 15 mg by mouth daily.     No current facility-administered medications on file prior to visit.    Allergies  Allergen Reactions   Morphine Other (See Comments)   Poison Ivy Extract Rash    Past Medical History:  Diagnosis Date   Cataract    in both eyes    had cataracts surgically removed   Complication of anesthesia  reports during prostate bx year ago , his BP dropped real low and he had to stay overnight after what was suposed to be an amulatory surgery    CVA (cerebral infarction)    Diabetes mellitus    ED (erectile dysfunction)    Elevated PSA    Frequency of urination    GERD (gastroesophageal reflux disease)    History of nephrolithiasis    Hyperlipidemia    Hypertension    Lung nodule    Psoriasis    PSVT (paroxysmal supraventricular tachycardia) (HCC)    Stroke (HCC)    2015   Stroke (HCC) 08/2017   reports no lasting deficits since stroke, endorses that he feels back to regular self since before stroke     Past Surgical History:  Procedure Laterality Date   CATARACT EXTRACTION W/  INTRAOCULAR LENS IMPLANT Right 03/30/2012   Dr Rosan   COLONOSCOPY  09/07/2020   2019   ELECTROPHYSIOLOGIC STUDY N/A 09/24/2014   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   NM MYOVIEW LTD     normal EF 56% 03/08   PARATHYROIDECTOMY Left 02/28/2018   Procedure: LEFT INFERIOR PARATHYROIDECTOMY;  Surgeon: Eletha Boas, MD;  Location: [facility];  Service: General;  Laterality: Left;   POLYPECTOMY     PROSTATE BIOPSY  01/05/2012   Procedure: BIOPSY TRANSRECTAL ULTRASONIC PROSTATE (TUBP);  Surgeon: Noretta Ferrara, MD;  Location: [facility];  Service: Urology;  Laterality: N/A;   PROSTATE BIOPSY N/A 10/14/2018   Procedure: BIOPSY TRANSRECTAL ULTRASONIC PROSTATE (TUBP);  Surgeon: Ferrara Glance, MD;  Location: [facility];  Service: Urology;  Laterality: N/A;   TEE WITHOUT CARDIOVERSION N/A 05/21/2014   Procedure: TRANSESOPHAGEAL ECHOCARDIOGRAM (TEE);  Surgeon: Oneil JAYSON Parchment, MD;  Location: [facility];  Service: Cardiovascular;  Laterality: N/A;   TONSILLECTOMY  1960    Family History  Problem Relation Age of Onset   Diabetes Mother    Breast cancer Mother    Colon polyps Brother    Heart disease Neg Hx    Colon cancer Neg Hx    Esophageal cancer Neg Hx    Rectal cancer Neg Hx    Stomach cancer Neg Hx     Social History   Socioeconomic History   Marital status: Married    Spouse name: Meade   Number of children: 2   Years of education: HS   Highest education level: Not on file  Occupational History   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aving    Comment: Retired  Tobacco Use   Smoking status: Former    Current packs/day: 1.00    Average packs/day: 1 pack/day for 5.0 years (5.0 ttl pk-yrs)    Types: Cigarettes    Passive exposure: Past (as a child)   Smokeless tobacco: Current    Types: Chew   Tobacco comments:    QUIT SMOKING CIGARETTES 50 YRS AGO--  CHEWED TOBACCO FOR 40 YRS  Vaping Use   Vaping status: Never Used  Substance and Sexual Activity   Alcohol  use: Not Currently     Alcohol /week: 0.0 standard drinks of alcohol    Drug use: No   Sexual activity: Not Currently  Other Topics Concern   Not on file  Social History Narrative   Has living will   Requests wife as health care POA. Children would be alternate   Would accept resuscitation attempts   Not sure about tube feeds      Lives at home with his wife   Right handed    Caffeine:  occasional    Social Drivers of Corporate investment banker Strain: Not on file  Food Insecurity: Low Risk  (08/13/2023)   Received from [facility]   Hunger Vital Sign    Within the past 12 months, you worried that your food would run out before you got money to buy more: Never true    Within the past 12 months, the food you bought just didn't last and you didn't have money to get more. : Never true  Transportation Needs: No Transportation Needs (08/13/2023)   Received from [facility]    In the past 12 months, has lack of reliable transportation kept you from medical appointments, meetings, work or from getting things needed for daily living? : No  Physical Activity: Not on file  Stress: Not on file  Social Connections: Not on file  Intimate Partner Violence: Not on file   Review of Systems appetite is okay--still lots of sweets Weight stable Sleeps okay Wears seat belt Teeth okay--keeps up with dentist No chest pain or SOB No dizziness or syncope No suspicious skin lesions---some splotchy rash on face and axilla Easy bruising No heartburn or dysphagia Bowels move okay Voids okay---quite frequent    Objective:   Physical Exam Constitutional:      Appearance: Normal appearance.  HENT:     Mouth/Throat:     Pharynx: No oropharyngeal exudate or posterior oropharyngeal erythema.  Eyes:     Conjunctiva/sclera: Conjunctivae normal.     Pupils: Pupils are equal, round, and reactive to light.  Cardiovascular:     Rate and Rhythm: Normal rate. Rhythm irregular.     Pulses: Normal pulses.      Heart sounds: No murmur heard.    No gallop.     Comments: Mostly bigeminy Pulmonary:     Effort: Pulmonary effort is normal.     Breath sounds: Normal breath sounds. No wheezing or rales.  Abdominal:     Palpations: Abdomen is soft.     Tenderness: There is no abdominal tenderness.  Musculoskeletal:     Cervical back: Neck supple.     Right lower leg: No edema.     Left lower leg: No edema.  Lymphadenopathy:     Cervical: No cervical adenopathy.  Skin:    Findings: No lesion or rash.     Comments: No foot lesions  Neurological:     Mental Status: He is alert.     Comments: Marked memory issues and no insight Decreased sensation in feet  Psychiatric:     Comments: Oppositional with discussion about help/retirement community            Assessment & Plan:

## 2023-10-02 NOTE — Assessment & Plan Note (Signed)
 Stable on jardiance  and lisinopril 

## 2023-10-02 NOTE — Assessment & Plan Note (Signed)
 Mostly bigeminy now Recent ER evaluation

## 2023-10-02 NOTE — Addendum Note (Signed)
 Addended by: JIMMY ADE I on: 10/02/2023 09:19 AM   Modules accepted: Orders

## 2023-10-02 NOTE — Assessment & Plan Note (Signed)
 Seeing endocrinologist now but control still not great Metformin , actos, jardiance 

## 2023-10-02 NOTE — Assessment & Plan Note (Signed)
 I have personally reviewed the Medicare Annual Wellness questionnaire and have noted 1. The patient's medical and social history 2. Their use of alcohol , tobacco or illicit drugs 3. Their current medications and supplements 4. The patient's functional ability including ADL's, fall risks, home safety risks and hearing or visual             impairment. 5. Diet and physical activities 6. Evidence for depression or mood disorders  The patients weight, height, BMI and visual acuity have been recorded in the chart I have made referrals, counseling and provided education to the patient based review of the above and I have provided the pt with a written personalized care plan for preventive services.  I have provided you with a copy of your personalized plan for preventive services. Please take the time to review along with your updated medication list.  Done with cancer screening due to age Flu/COVID/RSV by the fall Doesn't exercise--that won't change

## 2023-10-02 NOTE — Assessment & Plan Note (Signed)
 BP Readings from Last 3 Encounters:  10/02/23 124/80  08/13/23 (!) 176/89  06/26/23 132/88   Controlled on lisinopril  and amlodipine 

## 2023-10-02 NOTE — Assessment & Plan Note (Signed)
 The pattern of total lack of insight may be more consistent with AD Is on donepezil  and memantine  Alprazolam  prn for agitation--but generally refuses things when upset Discussed considering retirement community where wife can get more assistance in his care---he is adamantly opposed

## 2023-10-02 NOTE — Assessment & Plan Note (Signed)
 Looks fungal in groin  Will give ketoconazole  cream to try

## 2023-10-11 ENCOUNTER — Encounter: Admitting: Internal Medicine

## 2023-10-19 ENCOUNTER — Other Ambulatory Visit: Payer: Self-pay | Admitting: Internal Medicine

## 2023-11-08 ENCOUNTER — Ambulatory Visit: Admitting: Physician Assistant

## 2023-11-08 ENCOUNTER — Encounter: Payer: Self-pay | Admitting: Physician Assistant

## 2023-11-08 VITALS — BP 132/72 | HR 59 | Ht 69.0 in | Wt 164.0 lb

## 2023-11-08 DIAGNOSIS — F01511 Vascular dementia, unspecified severity, with agitation: Secondary | ICD-10-CM

## 2023-11-08 NOTE — Progress Notes (Signed)
 Assessment/Plan:   Dementia likely due to mixed vascular and Alzheimer's etiology without behavioral disturbance   Tony Jackson. is a very pleasant 83 y.o. RH male with a history of hypertension, hyperlipidemia, cataracts, history of CVA 2019, DM , GERD, history of lung nodule, seen today in follow up for memory loss. Patient is currently on donepezil  10 mg daily and memantine  10 mg twice daily. Cognitive decline is noted, with MMSE today at 11/30.  Patient is able to participate on ADLs, continues to drive. Mood is good.     Follow up in  6 months. Continue donepezil  10 mg daily and memantine  10 mg twice daily by PCP, side effects discussed  Continue B12, vitamin D  replenishment Recommend good control of her cardiovascular risk factors continue Plavix  and baby aspirin  Continue to control mood as per PCP Monitor driving Check hearing in an effort to improve comprehension     Subjective:    This patient is accompanied in the office by his wife who supplements the history.  Previous records as well as any outside records available were reviewed prior to todays visit. Patient was last seen on 03/15/2023 with MMSE 20/30 (unable to perform MoCA)    Any changes in memory since last visit? It is worse-wife says.  He has some difficulty remembering new information, names. LTM is affected as well. repeats oneself?  Endorsed, more frequently than before-wife says.  Disoriented when walking into a room? Denies    Leaving objects?  May misplace things but not in unusual places  Wandering behavior?  denies   Any personality changes since last visit?  Denies.  He may have some moments of irritability or angry over little things and then quickly forgets that he did. For example, I  made appt for his haircut because he said that he needed one and then I had to cancel it because he did not remember he told me he needed one-wife says Any worsening depression?:  Denies.   Hallucinations or  paranoia?  Denies.   Seizures? denies    Any sleep changes?  Sleeps well . Stays up really late now Denies vivid dreams, REM behavior or sleepwalking   Sleep apnea?   Denies.   Any hygiene concerns? Denies.  Independent of bathing and dressing?  Endorsed  Does the patient needs help with medications?  Wife is in charge   Who is in charge of the finances?  Wife is in charge     Any changes in appetite?  denies.  He enjoys snacking frequently.    Patient have trouble swallowing? Denies.   Does the patient cook? No Any headaches?   Denies.   Any vision changes? Denies  Chronic back pain  denies   Ambulates with difficulty?  Has diabetic neuropathy  so he is more cautious when ambulating, not very active.   Recent falls or head injuries? Denies.     Unilateral weakness, numbness or tingling? denies   Any tremors?  Denies    Any anosmia?  Denies   Any incontinence of urine?  Endorsed due to prostate issues , follows Dr. Renda, urology.   Any bowel dysfunction?  He has chronic intermittent diarrhea.    Patient lives with his wife    Does the patient drive?  Endorsed, denies getting lost I am a good driver . Wife is monitoring  Initial visit 08/23/2022   How long did patient have memory difficulties? For about 2 years, worse over the last 6 months.  Patient has some difficulty remembering recent conversations and people names, new information.   repeats oneself?  Endorsed Disoriented when walking into a room?  Patient denies   Leaving objects in unusual places? denies   Wandering behavior?  denies   Any personality changes?  Patient denies   Any history of depression?:  Patient denies   Hallucinations or paranoia?  Patient denies   Seizures?   Patient denies    Any sleep changes?  Sleeps well. Denies vivid dreams, REM behavior or sleepwalking   Sleep apnea?  Patient denies   Any hygiene concerns?  Patient denies   Independent of bathing and dressing?  Endorsed  Does the patient  needs help with medications? Wife is in charge , she fills up the pillbox Who is in charge of the finances? Wife is in charge     Any changes in appetite?  denies . He snacks a lot    Patient have trouble swallowing? denies   Does the patient cook? No   Any headaches?   denies   Chronic back pain ? denies   Ambulates with difficulty? He has DM neuropathy so he is more cautions when walking Recent falls or head injuries? denies   Vision changes? denies   Unilateral weakness? Denies  Any tremors?   Denies.   Any anosmia?  Denies.   Any incontinence of urine? Endorsed, has prostate issues, followed by Dr.Borden.    Any bowel dysfunction? Occasional diarrhea      Patient lives with wife  History of heavy alcohol  intake? denies   History of heavy tobacco use? denies   Family history of dementia? Father had AD   Does patient drive? Yes         MRI of the brain results from 09/07/22 personally reviewed, remarkable for small chronic cortical-subcortical infarcts within the posterior right frontal lobe, right parietal lobe, and right occipital lobe in the background of advanced cerebral white matter chronic small vessel ischemic disease.  There are known chronic infarcts within bilateral cerebral hemispheres, mild cerebellar atrophy and moderate generalized cerebral atrophy   PREVIOUS MEDICATIONS:   CURRENT MEDICATIONS:  Outpatient Encounter Medications as of 11/08/2023  Medication Sig   ALPRAZolam  (XANAX ) 0.25 MG tablet Take 1 tablet (0.25 mg total) by mouth 2 (two) times daily as needed for anxiety.   amLODipine  (NORVASC ) 10 MG tablet TAKE 1 TABLET BY MOUTH EVERY DAY   aspirin  EC 81 MG tablet Take 81 mg by mouth daily.   atorvastatin  (LIPITOR ) 80 MG tablet TAKE 1 TABLET BY MOUTH EVERY DAY   Cholecalciferol  (VITAMIN D3) 50 MCG (2000 UT) TABS Take 2,000 Units by mouth daily.   clopidogrel  (PLAVIX ) 75 MG tablet TAKE 1 TABLET BY MOUTH EVERY DAY   donepezil  (ARICEPT  ODT) 10 MG  disintegrating tablet TAKE 1 TABLET BY MOUTH EVERYDAY AT BEDTIME   empagliflozin  (JARDIANCE ) 10 MG TABS tablet TAKE 1 TABLET BY MOUTH DAILY BEFORE BREAKFAST.   finasteride (PROSCAR) 5 MG tablet Take 5 mg by mouth daily.   glipiZIDE  (GLUCOTROL  XL) 10 MG 24 hr tablet Take 1 tablet (10 mg total) by mouth daily with breakfast.   hydrocortisone  2.5 % cream APPLY TOPICALLY 3 TIMES DAILY AS NEEDED.   ketoconazole  (NIZORAL ) 2 % cream Apply 1 Application topically 2 (two) times daily.   ketoconazole  (NIZORAL ) 2 % shampoo APPLY TO 2 TO 3 TIMES A WEEK. LEAVE ON FOR   lisinopril  (ZESTRIL ) 40 MG tablet TAKE 1 TABLET BY MOUTH EVERY DAY  memantine  (NAMENDA ) 10 MG tablet TAKE 1 TABLET BY MOUTH TWICE A DAY   metFORMIN  (GLUCOPHAGE -XR) 500 MG 24 hr tablet Take 1 tablet (500 mg total) by mouth daily with breakfast.   mometasone  (ELOCON ) 0.1 % cream APPLY 1 APPLICATION TOPICALLY 2 (TWO) TIMES DAILY AS NEEDED (SKIN).   ONETOUCH DELICA LANCETS FINE MISC Use 1 daily to obtain blood sample Dx Code E11.21   ONETOUCH VERIO test strip USE 1 STRIP DAILY TO CHECK BLOOD SUGAR DX CODE E11.21   pioglitazone (ACTOS) 15 MG tablet Take 15 mg by mouth daily.   No facility-administered encounter medications on file as of 11/08/2023.       11/10/2023    7:00 PM 03/15/2023    3:00 PM 08/23/2022    5:00 PM  MMSE - Mini Mental State Exam  Orientation to time 0 2 2  Orientation to Place 2 5 5   Registration 2 3 2   Attention/ Calculation 0 0 0  Recall 0 1 0  Language- name 2 objects 2 2 2   Language- repeat 1 1 1   Language- follow 3 step command 3 3 3   Language- read & follow direction 1 1 1   Write a sentence 0 1 1  Copy design 0 1 1  Total score 11 20 18        No data to display          Objective:     PHYSICAL EXAMINATION:    VITALS:   Vitals:   11/08/23 1255  BP: 132/72  Pulse: (!) 59  SpO2: 97%  Weight: 164 lb (74.4 kg)  Height: 5' 9 (1.753 m)    GEN:  The patient appears stated age and is in  NAD. HEENT:  Normocephalic, atraumatic.   Neurological examination:  General: NAD, well-groomed, appears stated age. Orientation: The patient is alert. Oriented to person, not to place or to date Cranial nerves: There is good facial symmetry.The speech is fluent and clear. No aphasia or dysarthria. Fund of knowledge is reduced. Recent and remote memory are impaired. Attention and concentration are reduced. Able to name objects and repeat phrases.  Hearing is decreased to conversational tone.   Sensation: Sensation is intact to light touch throughout Motor: Strength is at least antigravity x4. DTR's 2/4 in UE/LE     Movement examination: Tone: There is normal tone in the UE/LE Abnormal movements:  no tremor.  No myoclonus.  No asterixis.   Coordination:  There is no decremation with RAM's. Normal finger to nose  Gait and Station: The patient has no difficulty arising out of a deep-seated chair without the use of the hands. The patient's stride length is good.  Gait is cautious and narrow.    Thank you for allowing us  the opportunity to participate in the care of this nice patient. Please do not hesitate to contact us  for any questions or concerns.   Total time spent on today's visit was 30 minutes dedicated to this patient today, preparing to see patient, examining the patient, ordering tests and/or medications and counseling the patient, documenting clinical information in the EHR or other health record, independently interpreting results and communicating results to the patient/family, discussing treatment and goals, answering patient's questions and coordinating care.  Cc:  Jimmy Charlie FERNS, MD  Camie Sevin 11/10/2023 7:57 PM

## 2023-11-08 NOTE — Patient Instructions (Signed)
 It was a pleasure to see you today at our office.   Recommendations:  Continue donepezil 10 mg daily. Side effects discussed   Increase memantine to 10 mg twice a day Follow up  in 6 months Continue b12 and D daily  Check hearing     For psychiatric meds, mood meds: Please have your primary care physician manage these medications.  If you have any severe symptoms of a stroke, or other severe issues such as confusion,severe chills or fever, etc call 911 or go to the ER as you may need to be evaluated further   For assessment of decision of mental capacity and competency:  Call Dr. Erick Blinks, geriatric psychiatrist at [phone]  Counseling regarding caregiver distress, including caregiver depression, anxiety and issues regarding community resources, adult day care programs, adult living facilities, or memory care questions:  please contact your  Primary Doctor's Social Worker   Whom to call: Memory  decline, memory medications: Call our office [phone]    [email or web address]   RECOMMENDATIONS FOR ALL PATIENTS WITH MEMORY PROBLEMS: 1. Continue to exercise (Recommend 30 minutes of walking everyday, or 3 hours every week) 2. Increase social interactions - continue going to [place] and enjoy social gatherings with friends and family 3. Eat healthy, avoid fried foods and eat more fruits and vegetables 4. Maintain adequate blood pressure, blood sugar, and blood cholesterol level. Reducing the risk of stroke and cardiovascular disease also helps promoting better memory. 5. Avoid stressful situations. Live a simple life and avoid aggravations. Organize your time and prepare for the next day in anticipation. 6. Sleep well, avoid any interruptions of sleep and avoid any distractions in the bedroom that may interfere with adequate sleep quality 7. Avoid sugar, avoid sweets as there is a strong link between excessive sugar intake,  diabetes, and cognitive impairment We discussed the Mediterranean diet, which has been shown to help patients reduce the risk of progressive memory disorders and reduces cardiovascular risk. This includes eating fish, eat fruits and green leafy vegetables, nuts like almonds and hazelnuts, walnuts, and also use olive oil. Avoid fast foods and fried foods as much as possible. Avoid sweets and sugar as sugar use has been linked to worsening of memory function.  There is always a concern of gradual progression of memory problems. If this is the case, then we may need to adjust level of care according to patient needs. Support, both to the patient and caregiver, should then be put into place.        DRIVING: Regarding driving, in patients with progressive memory problems, driving will be impaired. We advise to have someone else do the driving if trouble finding directions or if minor accidents are reported. Independent driving assessment is available to determine safety of driving.   If you are interested in the driving assessment, you can contact the following:  [facility] [employer] [facility] in [place] [phone]  [facility] [phone]  [facility] [phone]  [facility] [phone] or [phone]   FALL PRECAUTIONS: Be cautious when walking. Scan the area for obstacles that may increase the risk of trips and falls. When getting up in the mornings, sit up at the edge of the bed for a few minutes before getting out of bed. Consider elevating the bed at the head end to avoid drop of blood pressure when getting up. Walk always in a well-lit room (use night lights in the walls). Avoid area rugs or power cords from appliances in the middle  of the walkways. Use a [place] or a cane if necessary and consider physical therapy for balance exercise. Get your eyesight checked regularly.  FINANCIAL OVERSIGHT: Supervision, especially oversight when making financial  decisions or transactions is also recommended.  HOME SAFETY: Consider the safety of the kitchen when operating appliances like stoves, microwave oven, and blender. Consider having supervision and share cooking responsibilities until no longer able to participate in those. Accidents with firearms and other hazards in the house should be identified and addressed as well.   ABILITY TO BE LEFT ALONE: If patient is unable to contact 911 operator, consider using LifeLine, or when the need is there, arrange for someone to stay with patients. Smoking is a fire hazard, consider supervision or cessation. Risk of wandering should be assessed by caregiver and if detected at any point, supervision and safe proof recommendations should be instituted.  MEDICATION SUPERVISION: Inability to self-administer medication needs to be constantly addressed. Implement a mechanism to ensure safe administration of the medications.      Mediterranean Diet A Mediterranean diet refers to food and lifestyle choices that are based on the traditions of countries located on the [place]. This way of eating has been shown to help prevent certain conditions and improve outcomes for people who have chronic diseases, like kidney disease and heart disease. What are tips for following this plan? Lifestyle  Cook and eat meals together with your family, when possible. Drink enough fluid to keep your urine clear or pale yellow. Be physically active every day. This includes: Aerobic exercise like running or swimming. Leisure activities like gardening, walking, or housework. Get 7-8 hours of sleep each night. If recommended by your health care provider, drink red wine in moderation. This means 1 glass a day for nonpregnant women and 2 glasses a day for men. A glass of wine equals 5 oz (150 mL). Reading food labels  Check the serving size of packaged foods. For foods such as rice and pasta, the serving size refers to the amount  of cooked product, not dry. Check the total fat in packaged foods. Avoid foods that have saturated fat or trans fats. Check the ingredients list for added sugars, such as corn syrup. Shopping  At the grocery store, buy most of your food from the areas near the walls of the store. This includes: Fresh fruits and vegetables (produce). Grains, beans, nuts, and seeds. Some of these may be available in unpackaged forms or large amounts (in bulk). Fresh seafood. Poultry and eggs. Low-fat dairy products. Buy whole ingredients instead of prepackaged foods. Buy fresh fruits and vegetables in-season from local farmers markets. Buy frozen fruits and vegetables in resealable bags. If you do not have access to quality fresh seafood, buy precooked frozen shrimp or canned fish, such as tuna, salmon, or sardines. Buy small amounts of raw or cooked vegetables, salads, or olives from the deli or salad bar at your store. Stock your pantry so you always have certain foods on hand, such as olive oil, canned tuna, canned tomatoes, rice, pasta, and beans. Cooking  Cook foods with extra-virgin olive oil instead of using butter or other vegetable oils. Have meat as a side dish, and have vegetables or grains as your main dish. This means having meat in small portions or adding small amounts of meat to foods like pasta or stew. Use beans or vegetables instead of meat in common dishes like chili or lasagna. Experiment with different cooking methods. Try roasting or broiling vegetables instead  of steaming or sauteing them. Add frozen vegetables to soups, stews, pasta, or rice. Add nuts or seeds for added healthy fat at each meal. You can add these to yogurt, salads, or vegetable dishes. Marinate fish or vegetables using olive oil, lemon juice, garlic, and fresh herbs. Meal planning  Plan to eat 1 vegetarian meal one day each week. Try to work up to 2 vegetarian meals, if possible. Eat seafood 2 or more times a  week. Have healthy snacks readily available, such as: Vegetable sticks with hummus. Greek yogurt. Fruit and nut trail mix. Eat balanced meals throughout the week. This includes: Fruit: 2-3 servings a day Vegetables: 4-5 servings a day Low-fat dairy: 2 servings a day Fish, poultry, or lean meat: 1 serving a day Beans and legumes: 2 or more servings a week Nuts and seeds: 1-2 servings a day Whole grains: 6-8 servings a day Extra-virgin olive oil: 3-4 servings a day Limit red meat and sweets to only a few servings a month What are my food choices? Mediterranean diet Recommended Grains: Whole-grain pasta. Brown rice. Bulgar wheat. Polenta. Couscous. Whole-wheat bread. Orpah Cobb. Vegetables: Artichokes. Beets. Broccoli. Cabbage. Carrots. Eggplant. Green beans. Chard. Kale. Spinach. Onions. Leeks. Peas. Squash. Tomatoes. Peppers. Radishes. Fruits: Apples. Apricots. Avocado. Berries. Bananas. Cherries. Dates. Figs. Grapes. Lemons. Melon. Oranges. Peaches. Plums. Pomegranate. Meats and other protein foods: Beans. Almonds. Sunflower seeds. Pine nuts. Peanuts. Cod. Salmon. Scallops. Shrimp. Tuna. Tilapia. Clams. Oysters. Eggs. Dairy: Low-fat milk. Cheese. Greek yogurt. Beverages: Water. Red wine. Herbal tea. Fats and oils: Extra virgin olive oil. Avocado oil. Grape seed oil. Sweets and desserts: [place] yogurt with honey. Baked apples. Poached pears. Trail mix. Seasoning and other foods: Basil. Cilantro. Coriander. Cumin. Mint. Parsley. Sage. Rosemary. Tarragon. Garlic. Oregano. Thyme. Pepper. Balsalmic vinegar. Tahini. Hummus. Tomato sauce. Olives. Mushrooms. Limit these Grains: Prepackaged pasta or rice dishes. Prepackaged cereal with added sugar. Vegetables: Deep fried potatoes (french fries). Fruits: Fruit canned in syrup. Meats and other protein foods: Beef. Pork. Lamb. Poultry with skin. Hot dogs. Tomasa Blase. Dairy: Ice cream. Sour cream. Whole milk. Beverages: Juice. Sugar-sweetened soft  drinks. Beer. Liquor and spirits. Fats and oils: Butter. Canola oil. Vegetable oil. Beef fat (tallow). Lard. Sweets and desserts: Cookies. Cakes. Pies. Candy. Seasoning and other foods: Mayonnaise. Premade sauces and marinades. The items listed may not be a complete list. Talk with your dietitian about what dietary choices are right for you. Summary The Mediterranean diet includes both food and lifestyle choices. Eat a variety of fresh fruits and vegetables, beans, nuts, seeds, and whole grains. Limit the amount of red meat and sweets that you eat. Talk with your health care provider about whether it is safe for you to drink red wine in moderation. This means 1 glass a day for nonpregnant women and 2 glasses a day for men. A glass of wine equals 5 oz (150 mL). This information is not intended to replace advice given to you by your health care provider. Make sure you discuss any questions you have with your health care provider. Document Released: 09/30/2015 Document Revised: 11/02/2015 Document Reviewed: 09/30/2015 Elsevier Interactive Patient Education  2017 [employer].

## 2023-12-18 DIAGNOSIS — E1165 Type 2 diabetes mellitus with hyperglycemia: Secondary | ICD-10-CM | POA: Diagnosis not present

## 2024-04-03 ENCOUNTER — Encounter

## 2024-05-07 ENCOUNTER — Ambulatory Visit: Admitting: Physician Assistant
